# Patient Record
Sex: Female | Born: 1955 | Race: Black or African American | Hispanic: No | Marital: Married | Smoking: Former smoker
Health system: Southern US, Community
[De-identification: ages and names within clinical notes are randomized; demographics above are authoritative.]

## PROBLEM LIST (undated history)

## (undated) DIAGNOSIS — K5090 Crohn's disease, unspecified, without complications: Secondary | ICD-10-CM

## (undated) DIAGNOSIS — E079 Disorder of thyroid, unspecified: Secondary | ICD-10-CM

## (undated) DIAGNOSIS — R32 Unspecified urinary incontinence: Secondary | ICD-10-CM

## (undated) DIAGNOSIS — M1990 Unspecified osteoarthritis, unspecified site: Secondary | ICD-10-CM

## (undated) DIAGNOSIS — K635 Polyp of colon: Secondary | ICD-10-CM

## (undated) DIAGNOSIS — N879 Dysplasia of cervix uteri, unspecified: Secondary | ICD-10-CM

## (undated) DIAGNOSIS — J45909 Unspecified asthma, uncomplicated: Secondary | ICD-10-CM

## (undated) DIAGNOSIS — A0472 Enterocolitis due to Clostridium difficile, not specified as recurrent: Secondary | ICD-10-CM

## (undated) DIAGNOSIS — M8580 Other specified disorders of bone density and structure, unspecified site: Secondary | ICD-10-CM

## (undated) DIAGNOSIS — B009 Herpesviral infection, unspecified: Secondary | ICD-10-CM

## (undated) HISTORY — PX: COLPOSCOPY: SHX161

## (undated) HISTORY — PX: BREAST CYST ASPIRATION: SHX578

## (undated) HISTORY — DX: Herpesviral infection, unspecified: B00.9

## (undated) HISTORY — DX: Polyp of colon: K63.5

## (undated) HISTORY — PX: POLYPECTOMY: SHX149

## (undated) HISTORY — DX: Crohn's disease, unspecified, without complications: K50.90

## (undated) HISTORY — DX: Unspecified osteoarthritis, unspecified site: M19.90

## (undated) HISTORY — DX: Unspecified asthma, uncomplicated: J45.909

## (undated) HISTORY — DX: Dysplasia of cervix uteri, unspecified: N87.9

## (undated) HISTORY — PX: GANGLION CYST EXCISION: SHX1691

## (undated) HISTORY — DX: Unspecified urinary incontinence: R32

## (undated) HISTORY — DX: Other specified disorders of bone density and structure, unspecified site: M85.80

## (undated) HISTORY — DX: Disorder of thyroid, unspecified: E07.9

## (undated) HISTORY — DX: Enterocolitis due to Clostridium difficile, not specified as recurrent: A04.72

## (undated) HISTORY — PX: TONSILLECTOMY: SUR1361

## (undated) HISTORY — PX: BREAST EXCISIONAL BIOPSY: SUR124

## (undated) HISTORY — PX: TRIGGER FINGER RELEASE: SHX641

---

## 1996-10-08 ENCOUNTER — Encounter: Payer: Self-pay | Admitting: Gastroenterology

## 1998-03-14 ENCOUNTER — Encounter: Payer: Self-pay | Admitting: Emergency Medicine

## 1998-03-14 ENCOUNTER — Emergency Department (HOSPITAL_COMMUNITY): Admission: EM | Admit: 1998-03-14 | Discharge: 1998-03-14 | Payer: Self-pay | Admitting: Emergency Medicine

## 1998-05-31 HISTORY — PX: TOTAL ABDOMINAL HYSTERECTOMY: SHX209

## 1998-07-24 ENCOUNTER — Encounter: Payer: Self-pay | Admitting: Internal Medicine

## 1998-07-24 ENCOUNTER — Ambulatory Visit (HOSPITAL_COMMUNITY): Admission: RE | Admit: 1998-07-24 | Discharge: 1998-07-24 | Payer: Self-pay | Admitting: Internal Medicine

## 1998-08-07 ENCOUNTER — Encounter: Payer: Self-pay | Admitting: Gastroenterology

## 1998-08-07 ENCOUNTER — Ambulatory Visit (HOSPITAL_COMMUNITY): Admission: RE | Admit: 1998-08-07 | Discharge: 1998-08-07 | Payer: Self-pay | Admitting: Internal Medicine

## 1998-08-07 ENCOUNTER — Encounter: Payer: Self-pay | Admitting: Internal Medicine

## 1998-08-27 ENCOUNTER — Other Ambulatory Visit: Admission: RE | Admit: 1998-08-27 | Discharge: 1998-08-27 | Payer: Self-pay | Admitting: Obstetrics and Gynecology

## 1998-09-04 ENCOUNTER — Encounter: Payer: Self-pay | Admitting: Endocrinology

## 1998-09-04 ENCOUNTER — Ambulatory Visit (HOSPITAL_COMMUNITY): Admission: RE | Admit: 1998-09-04 | Discharge: 1998-09-04 | Payer: Self-pay | Admitting: Endocrinology

## 1998-09-30 ENCOUNTER — Ambulatory Visit (HOSPITAL_COMMUNITY): Admission: RE | Admit: 1998-09-30 | Discharge: 1998-09-30 | Payer: Self-pay

## 2000-01-28 ENCOUNTER — Emergency Department (HOSPITAL_COMMUNITY): Admission: EM | Admit: 2000-01-28 | Discharge: 2000-01-28 | Payer: Self-pay | Admitting: Emergency Medicine

## 2000-01-28 ENCOUNTER — Encounter: Payer: Self-pay | Admitting: Emergency Medicine

## 2000-06-15 ENCOUNTER — Other Ambulatory Visit: Admission: RE | Admit: 2000-06-15 | Discharge: 2000-06-15 | Payer: Self-pay | Admitting: Obstetrics and Gynecology

## 2000-07-12 ENCOUNTER — Other Ambulatory Visit: Admission: RE | Admit: 2000-07-12 | Discharge: 2000-07-12 | Payer: Self-pay | Admitting: Obstetrics and Gynecology

## 2000-07-12 ENCOUNTER — Encounter (INDEPENDENT_AMBULATORY_CARE_PROVIDER_SITE_OTHER): Payer: Self-pay | Admitting: Specialist

## 2000-09-13 ENCOUNTER — Encounter: Payer: Self-pay | Admitting: Obstetrics and Gynecology

## 2000-09-13 ENCOUNTER — Encounter: Admission: RE | Admit: 2000-09-13 | Discharge: 2000-09-13 | Payer: Self-pay | Admitting: Obstetrics and Gynecology

## 2000-09-14 ENCOUNTER — Encounter (INDEPENDENT_AMBULATORY_CARE_PROVIDER_SITE_OTHER): Payer: Self-pay

## 2000-09-14 ENCOUNTER — Other Ambulatory Visit: Admission: RE | Admit: 2000-09-14 | Discharge: 2000-09-14 | Payer: Self-pay | Admitting: Obstetrics and Gynecology

## 2001-02-06 ENCOUNTER — Encounter (INDEPENDENT_AMBULATORY_CARE_PROVIDER_SITE_OTHER): Payer: Self-pay | Admitting: Specialist

## 2001-02-06 ENCOUNTER — Inpatient Hospital Stay (HOSPITAL_COMMUNITY): Admission: RE | Admit: 2001-02-06 | Discharge: 2001-02-08 | Payer: Self-pay | Admitting: Obstetrics and Gynecology

## 2001-02-12 ENCOUNTER — Encounter: Payer: Self-pay | Admitting: *Deleted

## 2001-02-12 ENCOUNTER — Inpatient Hospital Stay (HOSPITAL_COMMUNITY): Admission: EM | Admit: 2001-02-12 | Discharge: 2001-02-15 | Payer: Self-pay | Admitting: Emergency Medicine

## 2001-02-13 ENCOUNTER — Encounter: Payer: Self-pay | Admitting: *Deleted

## 2001-08-24 ENCOUNTER — Encounter: Payer: Self-pay | Admitting: Emergency Medicine

## 2001-08-24 ENCOUNTER — Encounter: Admission: RE | Admit: 2001-08-24 | Discharge: 2001-08-24 | Payer: Self-pay | Admitting: Emergency Medicine

## 2003-03-11 ENCOUNTER — Other Ambulatory Visit: Admission: RE | Admit: 2003-03-11 | Discharge: 2003-03-11 | Payer: Self-pay | Admitting: Obstetrics and Gynecology

## 2003-08-15 ENCOUNTER — Encounter: Admission: RE | Admit: 2003-08-15 | Discharge: 2003-08-15 | Payer: Self-pay | Admitting: Obstetrics and Gynecology

## 2004-03-12 ENCOUNTER — Other Ambulatory Visit: Admission: RE | Admit: 2004-03-12 | Discharge: 2004-03-12 | Payer: Self-pay | Admitting: Obstetrics and Gynecology

## 2005-03-17 ENCOUNTER — Other Ambulatory Visit: Admission: RE | Admit: 2005-03-17 | Discharge: 2005-03-17 | Payer: Self-pay | Admitting: Obstetrics and Gynecology

## 2005-04-12 ENCOUNTER — Encounter: Admission: RE | Admit: 2005-04-12 | Discharge: 2005-04-12 | Payer: Self-pay | Admitting: Obstetrics and Gynecology

## 2006-03-18 ENCOUNTER — Other Ambulatory Visit: Admission: RE | Admit: 2006-03-18 | Discharge: 2006-03-18 | Payer: Self-pay | Admitting: Obstetrics and Gynecology

## 2006-10-31 ENCOUNTER — Emergency Department (HOSPITAL_COMMUNITY): Admission: EM | Admit: 2006-10-31 | Discharge: 2006-10-31 | Payer: Self-pay | Admitting: Family Medicine

## 2006-12-21 ENCOUNTER — Ambulatory Visit (HOSPITAL_COMMUNITY): Admission: RE | Admit: 2006-12-21 | Discharge: 2006-12-21 | Payer: Self-pay | Admitting: Internal Medicine

## 2007-01-02 ENCOUNTER — Encounter: Admission: RE | Admit: 2007-01-02 | Discharge: 2007-01-02 | Payer: Self-pay | Admitting: Internal Medicine

## 2007-05-30 ENCOUNTER — Other Ambulatory Visit: Admission: RE | Admit: 2007-05-30 | Discharge: 2007-05-30 | Payer: Self-pay | Admitting: Obstetrics and Gynecology

## 2007-09-26 ENCOUNTER — Telehealth: Payer: Self-pay | Admitting: Gastroenterology

## 2007-09-27 ENCOUNTER — Ambulatory Visit: Payer: Self-pay | Admitting: Gastroenterology

## 2007-09-29 DIAGNOSIS — K635 Polyp of colon: Secondary | ICD-10-CM

## 2007-09-29 HISTORY — DX: Polyp of colon: K63.5

## 2007-10-04 ENCOUNTER — Ambulatory Visit: Payer: Self-pay | Admitting: Gastroenterology

## 2007-10-04 ENCOUNTER — Encounter: Payer: Self-pay | Admitting: Gastroenterology

## 2007-10-05 ENCOUNTER — Encounter: Payer: Self-pay | Admitting: Gastroenterology

## 2007-10-08 ENCOUNTER — Encounter: Payer: Self-pay | Admitting: Gastroenterology

## 2007-10-16 ENCOUNTER — Telehealth: Payer: Self-pay | Admitting: Gastroenterology

## 2007-10-31 DIAGNOSIS — J4540 Moderate persistent asthma, uncomplicated: Secondary | ICD-10-CM | POA: Insufficient documentation

## 2007-10-31 DIAGNOSIS — K648 Other hemorrhoids: Secondary | ICD-10-CM | POA: Insufficient documentation

## 2007-10-31 DIAGNOSIS — K6289 Other specified diseases of anus and rectum: Secondary | ICD-10-CM | POA: Insufficient documentation

## 2007-10-31 DIAGNOSIS — Z8719 Personal history of other diseases of the digestive system: Secondary | ICD-10-CM | POA: Insufficient documentation

## 2007-10-31 DIAGNOSIS — D649 Anemia, unspecified: Secondary | ICD-10-CM | POA: Insufficient documentation

## 2007-10-31 DIAGNOSIS — R197 Diarrhea, unspecified: Secondary | ICD-10-CM | POA: Insufficient documentation

## 2007-11-01 ENCOUNTER — Encounter: Payer: Self-pay | Admitting: Gastroenterology

## 2007-11-02 ENCOUNTER — Telehealth: Payer: Self-pay | Admitting: Gastroenterology

## 2007-11-02 ENCOUNTER — Ambulatory Visit: Payer: Self-pay | Admitting: Gastroenterology

## 2007-11-02 DIAGNOSIS — K219 Gastro-esophageal reflux disease without esophagitis: Secondary | ICD-10-CM | POA: Insufficient documentation

## 2007-11-02 DIAGNOSIS — R141 Gas pain: Secondary | ICD-10-CM | POA: Insufficient documentation

## 2007-11-02 DIAGNOSIS — R142 Eructation: Secondary | ICD-10-CM

## 2007-11-02 DIAGNOSIS — R143 Flatulence: Secondary | ICD-10-CM

## 2008-01-01 ENCOUNTER — Ambulatory Visit: Payer: Self-pay | Admitting: Gastroenterology

## 2008-03-22 ENCOUNTER — Encounter: Admission: RE | Admit: 2008-03-22 | Discharge: 2008-03-22 | Payer: Self-pay | Admitting: Obstetrics and Gynecology

## 2008-03-26 ENCOUNTER — Other Ambulatory Visit: Admission: RE | Admit: 2008-03-26 | Discharge: 2008-03-26 | Payer: Self-pay | Admitting: Obstetrics and Gynecology

## 2008-03-26 ENCOUNTER — Ambulatory Visit: Payer: Self-pay | Admitting: Obstetrics and Gynecology

## 2008-03-26 ENCOUNTER — Encounter: Payer: Self-pay | Admitting: Obstetrics and Gynecology

## 2008-04-30 ENCOUNTER — Ambulatory Visit: Payer: Self-pay | Admitting: Obstetrics and Gynecology

## 2008-07-03 ENCOUNTER — Emergency Department (HOSPITAL_COMMUNITY): Admission: EM | Admit: 2008-07-03 | Discharge: 2008-07-03 | Payer: Self-pay | Admitting: Emergency Medicine

## 2008-08-09 ENCOUNTER — Ambulatory Visit: Payer: Self-pay | Admitting: Gastroenterology

## 2008-08-09 DIAGNOSIS — M25519 Pain in unspecified shoulder: Secondary | ICD-10-CM | POA: Insufficient documentation

## 2008-08-15 ENCOUNTER — Ambulatory Visit (HOSPITAL_COMMUNITY): Admission: RE | Admit: 2008-08-15 | Discharge: 2008-08-15 | Payer: Self-pay | Admitting: Internal Medicine

## 2008-12-19 ENCOUNTER — Telehealth: Payer: Self-pay | Admitting: Gastroenterology

## 2009-05-12 ENCOUNTER — Encounter: Admission: RE | Admit: 2009-05-12 | Discharge: 2009-05-12 | Payer: Self-pay | Admitting: Obstetrics and Gynecology

## 2009-05-20 ENCOUNTER — Ambulatory Visit: Payer: Self-pay | Admitting: Obstetrics and Gynecology

## 2009-05-20 ENCOUNTER — Other Ambulatory Visit: Admission: RE | Admit: 2009-05-20 | Discharge: 2009-05-20 | Payer: Self-pay | Admitting: Obstetrics and Gynecology

## 2009-07-13 ENCOUNTER — Emergency Department (HOSPITAL_COMMUNITY): Admission: EM | Admit: 2009-07-13 | Discharge: 2009-07-13 | Payer: Self-pay | Admitting: Emergency Medicine

## 2009-08-19 ENCOUNTER — Ambulatory Visit: Payer: Self-pay | Admitting: Gastroenterology

## 2010-01-26 ENCOUNTER — Telehealth: Payer: Self-pay | Admitting: Gastroenterology

## 2010-01-28 ENCOUNTER — Ambulatory Visit: Payer: Self-pay | Admitting: Gastroenterology

## 2010-01-28 DIAGNOSIS — R1084 Generalized abdominal pain: Secondary | ICD-10-CM | POA: Insufficient documentation

## 2010-01-28 DIAGNOSIS — K625 Hemorrhage of anus and rectum: Secondary | ICD-10-CM | POA: Insufficient documentation

## 2010-01-28 LAB — CONVERTED CEMR LAB
Basophils Absolute: 0.1 10*3/uL (ref 0.0–0.1)
Basophils Relative: 1.9 % (ref 0.0–3.0)
CRP, High Sensitivity: 19.16 — ABNORMAL HIGH (ref 0.00–5.00)
Eosinophils Absolute: 0.4 10*3/uL (ref 0.0–0.7)
Eosinophils Relative: 9.9 % — ABNORMAL HIGH (ref 0.0–5.0)
HCT: 39.2 % (ref 36.0–46.0)
Hemoglobin: 13.4 g/dL (ref 12.0–15.0)
Lymphocytes Relative: 24.2 % (ref 12.0–46.0)
Lymphs Abs: 0.9 10*3/uL (ref 0.7–4.0)
MCHC: 34.3 g/dL (ref 30.0–36.0)
MCV: 90.1 fL (ref 78.0–100.0)
Monocytes Absolute: 0.4 10*3/uL (ref 0.1–1.0)
Monocytes Relative: 11.2 % (ref 3.0–12.0)
Neutro Abs: 1.9 10*3/uL (ref 1.4–7.7)
Neutrophils Relative %: 52.8 % (ref 43.0–77.0)
Platelets: 321 10*3/uL (ref 150.0–400.0)
RBC: 4.35 M/uL (ref 3.87–5.11)
RDW: 13.4 % (ref 11.5–14.6)
WBC: 3.7 10*3/uL — ABNORMAL LOW (ref 4.5–10.5)

## 2010-02-07 ENCOUNTER — Encounter: Payer: Self-pay | Admitting: Gastroenterology

## 2010-02-07 ENCOUNTER — Ambulatory Visit: Payer: Self-pay | Admitting: Internal Medicine

## 2010-02-07 ENCOUNTER — Inpatient Hospital Stay (HOSPITAL_COMMUNITY): Admission: EM | Admit: 2010-02-07 | Discharge: 2010-02-10 | Payer: Self-pay | Admitting: Emergency Medicine

## 2010-02-10 ENCOUNTER — Encounter: Payer: Self-pay | Admitting: Gastroenterology

## 2010-02-25 ENCOUNTER — Ambulatory Visit: Payer: Self-pay | Admitting: Gastroenterology

## 2010-05-13 ENCOUNTER — Encounter
Admission: RE | Admit: 2010-05-13 | Discharge: 2010-05-13 | Payer: Self-pay | Source: Home / Self Care | Attending: Obstetrics and Gynecology | Admitting: Obstetrics and Gynecology

## 2010-06-08 ENCOUNTER — Ambulatory Visit (HOSPITAL_COMMUNITY)
Admission: RE | Admit: 2010-06-08 | Discharge: 2010-06-08 | Payer: Self-pay | Source: Home / Self Care | Attending: Internal Medicine | Admitting: Internal Medicine

## 2010-06-10 ENCOUNTER — Ambulatory Visit: Admission: EM | Admit: 2010-06-10 | Payer: Self-pay | Source: Home / Self Care | Admitting: Obstetrics and Gynecology

## 2010-06-20 ENCOUNTER — Encounter: Payer: Self-pay | Admitting: Obstetrics and Gynecology

## 2010-06-29 ENCOUNTER — Other Ambulatory Visit (HOSPITAL_COMMUNITY)
Admission: RE | Admit: 2010-06-29 | Discharge: 2010-06-29 | Disposition: A | Discharge status: Home / Self Care | Payer: Managed Care, Other (non HMO) | Source: Ambulatory Visit | Attending: Obstetrics and Gynecology | Admitting: Obstetrics and Gynecology

## 2010-06-29 ENCOUNTER — Ambulatory Visit
Admission: RE | Admit: 2010-06-29 | Discharge: 2010-06-29 | Payer: Self-pay | Source: Home / Self Care | Attending: Obstetrics and Gynecology | Admitting: Obstetrics and Gynecology

## 2010-06-29 ENCOUNTER — Other Ambulatory Visit: Payer: Self-pay | Admitting: Obstetrics and Gynecology

## 2010-06-29 DIAGNOSIS — Z124 Encounter for screening for malignant neoplasm of cervix: Secondary | ICD-10-CM | POA: Insufficient documentation

## 2010-06-30 NOTE — Op Note (Signed)
Summary: Colonoscopy & Biopsies/[facility]  [facility] & Biopsies/[facility]   Imported By: Phillis Knack 08/10/2008 08:28:53  _____________________________________________________________________  External Attachment:    Type:   Image     Comment:   External Document

## 2010-06-30 NOTE — Progress Notes (Signed)
Summary: Triage  Phone Note Call from Patient Call back at Home Phone [phone]   Caller: Patient Call For: Dr Fuller Plan Reason for Call: Talk to Nurse Details for Reason: Triage Summary of Call: Pt is having bright red blood in stool and abd cramping, states these symptoms are waking her from sleep and does not wish to wait until next available, which is 03/26/10 Initial call taken by: Cora Daniels,  January 26, 2010 2:55 PM  Follow-up for Phone Call        Patient reports a 1 week hx of bright red rectal bleeding, diarrhea ,abdominal cramping, and urgency.  She states she is taking Lialda 2.4 mg q am.  Denies nasuea, vomiting, or abdominal pain.  Please advise. Follow-up by: Barb Merino RN, [place] [facility],  January 26, 2010 3:27 PM  Additional Follow-up for Phone Call Additional follow up Details #1::        Workin with extender when I am supervising MD. Additional Follow-up by: Ladene Artist MD Marval Regal,  January 26, 2010 3:34 PM    Additional Follow-up for Phone Call Additional follow up Details #2::    Patient  will come in and see Nicoletta Ba PA 01/28/10 9:30 Follow-up by: Barb Merino RN, CGRN,  January 26, 2010 3:39 PM

## 2010-06-30 NOTE — Assessment & Plan Note (Signed)
Summary: post hospital c-diff/UC/sheri   History of Present Illness Visit Type: Follow-up Visit Primary GI MD: Joylene Igo MD [facility] Primary Tamsen Reist: Rachel Moulds, DO Requesting Annalysse Shoemaker: n/a Chief Complaint: Post hospital/ c-diff,bowels are still soft; other symptoms have improved History of Present Illness:   Brianna Santana returns following hospitalization for C. difficile diarrhea. She completed a 14 day course of vancomycin and states she was compliant with all medications. I treated her in the hospital and reviewed her discharge summary as well. Her stools are soft, but otherwise her symptoms have completely resolved. She feels well.   GI Review of Systems      Denies abdominal pain, acid reflux, belching, bloating, chest pain, dysphagia with liquids, dysphagia with solids, heartburn, loss of appetite, nausea, vomiting, vomiting blood, weight loss, and  weight gain.        Denies anal fissure, black tarry stools, change in bowel habit, constipation, diarrhea, diverticulosis, fecal incontinence, heme positive stool, hemorrhoids, irritable bowel syndrome, jaundice, light color stool, liver problems, rectal bleeding, and  rectal pain.   Current Medications (verified): 1)  Lialda 1.2 Gm  Tbec (Mesalamine) .... 2 By Mouth Twice Daily 2)  Singulair 10 Mg  Tabs (Montelukast Sodium) .... One Tablet Once Daily 3)  Proair Hfa 108 (90 Base) Mcg/act  Aers (Albuterol Sulfate) .... As Needed 4)  Multivitamins  Tabs (Multiple Vitamin) .... Take 1 Tablet By Mouth Once A Day 5)  Calcium 600/vitamin D 600-400 Mg-Unit Tabs (Calcium Carbonate-Vitamin D) .... Take 1 Tablet By Mouth Two Times A Day 6)  Omeprazole 40 Mg Cpdr (Omeprazole) .... One Tablet By Mouth Every Morning As Needed 7)  Bentyl 10 Mg Caps (Dicyclomine Hcl) .... 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 Diff colitis 2011  Past Surgical History: Reviewed history from 10/31/2007 and no changes required. Partial Hysterectomy-2002 Tonsillectomy Right Breast Lumpectomy-1980's  Family History: Reviewed history from 10/31/2007 and no changes required. No FH of Colon Cancer: Family History of Diabetes:Father,sisters,brother  Social History: Reviewed history from 10/31/2007 and no changes required. Married Patient is a former smoker. smoked x 10 yrs Alcohol Use - no Daily Caffeine Use- 3 per day Illicit Drug Use - no  Review of Systems       The pertinent positives and negatives are noted as above and in the HPI. All other ROS were reviewed and were negative.   Vital Signs:  Patient profile:   55 year old female Height:      63 inches Weight:      164.50 pounds BMI:     29.25 Pulse rate:   60 / minute Pulse rhythm:   regular BP sitting:   108 / 70  (left arm) Cuff size:   regular  Vitals Entered By: June McMurray [place] Deborra Medina) (February 25, 2010 9:41 AM)  Physical Exam  General:  Well developed, well nourished, no acute distress. Head:  Normocephalic and atraumatic. Eyes:  PERRLA, no icterus. Ears:  Normal auditory acuity. Mouth:  No deformity or lesions, dentition normal. Lungs:  Clear throughout to auscultation. Heart:  Regular rate and rhythm; no murmurs, rubs,  or bruits. Abdomen:  Soft, nontender and nondistended. No masses, hepatosplenomegaly or hernias noted. Normal bowel sounds. Psych:  Alert  and cooperative. Normal mood and affect.  Impression & Recommendations:  Problem # 1:  CLOSTRIDIUM DIFFICILE COLITIS (ICD-008.45) Resolved infection. Continue Florastor for 2 more weeks.  Problem # 2:  COLITIS, ULCERATIVE (ICD-556.9) Stable. Continue Lialda 2.4 g  b.i.d.  Patient Instructions: 1)  The prescription for Florastor has been sent to your pharmacy for you to take x 2 weeks.  2)  Please schedule a follow-up appointment in 1 year. 3)  Copy sent to : Rachel Moulds, DO 4)  The medication list was reviewed and reconciled.  All changed / newly prescribed medications were explained.  A complete medication list was provided to the patient / caregiver.  Prescriptions: FLORASTOR 250 MG CAPS (SACCHAROMYCES BOULARDII) one capsule by mouth two times a day  #28 x 0   Entered by:   Brianna Santana CMA ([place])   Authorized by:   Ladene Artist MD [facility]   Signed by:   Brianna Santana CMA ([place]) on 02/25/2010   Method used:   Electronically to        [id number] (retail)       [address] [place], [place]  [address]       Ph: [phone]       Fax: [phone]   RxID:   [id number]

## 2010-06-30 NOTE — Assessment & Plan Note (Signed)
Summary: 2 MONTH RECHECK/LD   History of Present Illness Visit Type: follow up Primary GI MD: Joylene Igo MD [facility] Primary Provider: Rachel Moulds, DO Chief Complaint: ulcerative colitis History of Present Illness:   Brianna Santana returns for followup of ulcerative colitis. She has been doing very well for the past 2 months.  Last night she developed a dry cough and this morning she noticed a slight right lower quadrant discomfort when coughing.   GI Review of Systems     Location of  Abdominal pain: RLQ.    Denies abdominal pain, acid reflux, belching, bloating, chest pain, dysphagia with liquids, dysphagia with solids, heartburn, loss of appetite, nausea, vomiting, vomiting blood, weight loss, and  weight gain.        Denies anal fissure, black tarry stools, change in bowel habit, constipation, diarrhea, diverticulosis, fecal incontinence, heme positive stool, hemorrhoids, irritable bowel syndrome, jaundice, light color stool, liver problems, rectal bleeding, and  rectal pain.    Prior Medications Reviewed Using: Patient Recall  Updated Prior Medication List: LIALDA 1.2 GM  TBEC (MESALAMINE) 2 by mouth once daily SINGULAIR 10 MG  TABS (MONTELUKAST SODIUM) one tablet once daily PROAIR HFA 108 (90 BASE) MCG/ACT  AERS (ALBUTEROL SULFATE) as needed KLS ALLERCLEAR 10 MG  TABS (LORATADINE) once daily  Current Allergies: No known allergies  Past Medical History:    Reviewed history from 11/02/2007 and no changes required:       INTERNAL HEMORRHOIDS (ICD-455.0)       LEFT SIDED ULCERATIVE COLITIS       PERSONAL HISTORY OF HYPEPLASTIC COLONIC POLYPS       ASTHMA (ICD-493.90)       ANEMIA (ICD-285.9)                              Past Surgical History:    Reviewed history from 10/31/2007 and no changes required:       Partial Hysterectomy-2002       Tonsillectomy       Right Breast Lumpectomy-1980's   Family History:    Reviewed history from 10/31/2007 and no changes  required:       No FH of Colon Cancer:       Family History of Diabetes:Father,sisters,brother  Social History:    Reviewed history from 10/31/2007 and no changes required:       Married       Patient is a former smoker. smoked x 10 yrs       Alcohol Use - no       Daily Caffeine Use- 3 per day       Illicit Drug Use - no   Review of Systems       The patient complains of allergy/sinus, anemia, cough, muscle pains/cramps, skin rash, and urination - excessive.         The pertinent positives and negatives are noted as above and in the HPI. All other ROS were negative.    Vital Signs:  Patient Profile:   55 Years Old Female Height:     63 inches Weight:      167.38 pounds Pulse rate:   60 / minute Pulse rhythm:   regular BP sitting:   110 / 62  (left arm)  Vitals Entered By: June McMurray [place] (January 01, 2008 8:39 AM)                  Physical Exam  Head:     Normocephalic and atraumatic. Eyes:     PERRLA, no icterus. Mouth:     No deformity or lesions, dentition normal. Lungs:     Clear throughout to auscultation. Heart:     Regular rate and rhythm; no murmurs, rubs,  or bruits. Abdomen:     Soft and nondistended. No masses, hepatosplenomegaly or hernias noted. Normal bowel sounds.  Minimal RLQ tenderness, without guarding, and without rebound.     Impression & Recommendations:  Problem # 1:  ULCERATIVE COLITIS-LEFT SIDE (ICD-556.5) Continue Lialda 2.4 g q.a.m. Her mild right lower quadrant pain appears to be musculoskeletal and related to her cough. She will followup with her primary physician for cough if it persists.   Patient Instructions: 1)  Please schedule a follow-up appointment in 6 months. 2)  Copy Sent To: Amy Johnnye Sima DO    Prescriptions: LIALDA 1.2 GM  TBEC (MESALAMINE) 2 by mouth once daily  #60 x 11   Entered by:   Marlon Pel CMA   Authorized by:   Ladene Artist MD [facility]   Signed by:   Marlon Pel CMA on 01/01/2008    Method used:   Electronically sent to ...       [facility]       [place], [place]  [address]       Ph: [phone]       Fax: [phone]   RxID:   [id number]  ]

## 2010-06-30 NOTE — Assessment & Plan Note (Signed)
Summary: ?UC flare/sheri   History of Present Illness Visit Type: Follow-up Visit Primary GI MD: Joylene Igo MD [facility] Primary Provider: Rachel Moulds, DO Requesting Provider: n/a Chief Complaint: Possible UC flare History of Present Illness:   PLEASANT 55 YO FEMALE  KNOWN TO DR. Fuller Plan WITH LEFT SIDED ULCERATIVE COLITIS DX IN 2009. SHE HAS BEEN WELL CONTROLLED WITH LOW DOSE LIALDA 1.2 GM, 2 DAILY. SHE COMES IN NOW WITH  2 WEEK HX OF INCREASED FREQUENCY OF STOOLS-HAVING 3-4 LOOSE BM'S DAILY, LOWER ABDOMINAL CRAMPING. APPETITE FAIR, NO WEIGHT LOSS. ENERGY LEVEL NORMAL. SHE STATRED SEEING BLOOD COATING STOOLS A FEW DAYS AGO. NO NEW MEDS, NO RECENT ABX ETC.   GI Review of Systems    Reports abdominal pain and  nausea.     Location of  Abdominal pain: LLQ.    Denies acid reflux, belching, bloating, chest pain, dysphagia with liquids, dysphagia with solids, heartburn, loss of appetite, vomiting, vomiting blood, weight loss, and  weight gain.      Reports change in bowel habits, diarrhea, and  rectal bleeding.     Denies anal fissure, black tarry stools, constipation, diverticulosis, fecal incontinence, heme positive stool, hemorrhoids, irritable bowel syndrome, jaundice, light color stool, liver problems, and  rectal pain.   Current Medications (verified): 1)  Lialda 1.2 Gm  Tbec (Mesalamine) .... 2 By Mouth Once Daily 2)  Singulair 10 Mg  Tabs (Montelukast Sodium) .... One Tablet Once Daily 3)  Proair Hfa 108 (90 Base) Mcg/act  Aers (Albuterol Sulfate) .... As Needed 4)  Kls Allerclear 10 Mg  Tabs (Loratadine) .... Once Daily 5)  Multivitamins  Tabs (Multiple Vitamin) .... Take 1 Tablet By Mouth Once A Day 6)  Calcium 600/vitamin D 600-400 Mg-Unit Tabs (Calcium Carbonate-Vitamin D) .... Take 1 Tablet By Mouth Two Times A Day 7)  Omeprazole 40 Mg Cpdr (Omeprazole) .... 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 smoked x 10 yrs Alcohol Use - no Daily Caffeine Use- 3 per day Illicit Drug Use - no  Review of Systems       The patient complains of arthritis/joint pain and back pain.  The patient denies allergy/sinus, anemia, anxiety-new, blood in urine, breast changes/lumps, change in vision, confusion, cough, coughing up blood, depression-new, fainting, fatigue, fever, headaches-new, hearing problems, heart murmur, heart rhythm changes, itching, menstrual pain, muscle pains/cramps, night sweats, nosebleeds, pregnancy symptoms, shortness of breath, skin rash, sleeping problems, sore throat, swelling of feet/legs, swollen lymph glands, thirst - excessive , urination - excessive , urination changes/pain, urine leakage, vision changes, and voice change.    Vital Signs:  Patient profile:   55 year old female Height:      63 inches Weight:      161 pounds BMI:     28.62 BSA:     1.77 Pulse rate:   80 / minute Pulse rhythm:   regular BP sitting:   94 / 60  (left arm)  Vitals Entered By: [place] Deborra Medina) (January 28, 2010 9:32  AM)  Physical Exam  General:  Well developed, well nourished, no acute distress. Head:  Normocephalic and atraumatic. Eyes:  PERRLA, no icterus. Lungs:  Clear throughout to auscultation. Heart:  Regular rate and rhythm; no murmurs, rubs,  or bruits. Abdomen:  SOFT, MILD TENDERNESS LLQ/LMQ, NO  RENOUND, NO MASS OR HSM,BS+ Rectal:  NOT DONE Neurologic:  Alert and  oriented x4;  grossly normal neurologically. Psych:  Alert and cooperative. Normal mood and affect.  Impression & Recommendations:  Problem # 1:  COLITIS, ULCERATIVE (ICD-556.9) Assessment Deteriorated 54 YO FEMALE WITH LEFT SIDED U.C . WITH EXACERBATION X 2 WEEKS WITH CRAMPING, DIARRHEA AND HEMATOCHEZIA. INCREASE LIALDA TO 2.4 GM TWICE DAILY ADD BENTYL 10 MG 3 X DAILY AS NEEDED. PT ASKED TO CALL BACK IN A WEEK -IF NO BETTER WILL START PREDNISONE  FOLLOW UP WITH DR. Fuller Plan  OR MYSELF IN 2-3 WEEKS. FOLLOW UP COLON DUE 09/2010 Orders: TLB-CRP-High Sensitivity (C-Reactive Protein) (86140-FCRP) TLB-CBC Platelet - w/Differential ([id number])  Patient Instructions: 1)  Please go to lab, basement level. 2)  We sent prescriptions to [facility] for Lialda and Bentyl.  3)  Calll [name] in 1 week, ask for Pam at [phone].  We would like you to call sooner if you have not improved.   4)  When you call we will make you an appt to see Dr. Fuller Plan or the PA again if need be. 5)  Copy sent to :  Rachel Moulds, DO 6)  The medication list was reviewed and reconciled.  All changed / newly prescribed medications were explained.  A complete medication list was provided to the patient / caregiver.  Prescriptions: BENTYL 10 MG CAPS (DICYCLOMINE HCL) Take 1 tab 3 times daily as needed for abdominal cramping and spasms  #90 x 1   Entered by:   Marisue Humble NCMA   Authorized by:   Alfredia Ferguson PA-c   Signed by:   Marisue Humble NCMA on 01/28/2010   Method used:   Electronically to        [id number] (retail)       [address] [place], [place]  [address]       Ph: [phone]       Fax: [phone]   RxID:   [id number] LIALDA 1.2 GM  TBEC (MESALAMINE) 2 by mouth twice daily  #120 x 3   Entered by:   Marisue Humble NCMA   Authorized by:   Alfredia Ferguson PA-c   Signed by:   Marisue Humble NCMA on  01/28/2010   Method used:   Electronically to        [id number] (retail)       [address] [place], [place]  [address]       Ph: [phone]       Fax: [phone]   RxID:   [id number]

## 2010-06-30 NOTE — Assessment & Plan Note (Signed)
Summary: YEARLY MED REFILL F-UP/YF   History of Present Illness Visit Type: Follow-up Visit Primary GI MD: Joylene Igo MD [facility] Primary Provider: Rachel Moulds, DO Chief Complaint: Patient states that she ran out of [place] earlier this month. She has not been taking nay since 3/12. She states that she is doing good, but needs a refill on her meds. The rx is at the pharm and she has not picked it up yet.  History of Present Illness:   This is a 55 year old female, who returns for followup of left-sided ulcerative colitis and GERD. She has been completely asymptomatic. She takes omeprazole on demand. She underwent colonoscopy in 09/2007.   GI Review of Systems      Denies abdominal pain, acid reflux, belching, bloating, chest pain, dysphagia with liquids, dysphagia with solids, heartburn, loss of appetite, nausea, vomiting, vomiting blood, weight loss, and  weight gain.        Denies anal fissure, black tarry stools, change in bowel habit, constipation, diarrhea, diverticulosis, fecal incontinence, heme positive stool, hemorrhoids, irritable bowel syndrome, jaundice, light color stool, liver problems, rectal bleeding, and  rectal pain.   Current Medications (verified): 1)  Lialda 1.2 Gm  Tbec (Mesalamine) .... 2 By Mouth Once Daily 2)  Singulair 10 Mg  Tabs (Montelukast Sodium) .... One Tablet Once Daily 3)  Proair Hfa 108 (90 Base) Mcg/act  Aers (Albuterol Sulfate) .... As Needed 4)  Kls Allerclear 10 Mg  Tabs (Loratadine) .... Once Daily 5)  Multivitamins  Tabs (Multiple Vitamin) .... Take 1 Tablet By Mouth Once A Day 6)  Calcium 600/vitamin D 600-400 Mg-Unit Tabs (Calcium Carbonate-Vitamin D) .... Take 1 Tablet By Mouth Two Times A Day 7)  Omeprazole 40 Mg Cpdr (Omeprazole) .... 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 smoked x 10 yrs Alcohol Use - no Daily Caffeine Use- 3 per day Illicit Drug Use - no  Review of Systems       The patient complains of allergy/sinus, arthritis/joint pain, and thirst - excessive.         The pertinent positives and negatives are noted as above and in the HPI. All other ROS were reviewed and were negative.   Vital Signs:  Patient profile:   55 year old female Height:      63 inches Weight:      162.8 pounds BMI:     28.94 Pulse rate:   92 / minute Pulse rhythm:   regular BP sitting:   102 / 62  (left arm) Cuff size:   regular  Vitals Entered By: Bernita Buffy CMA Deborra Medina) (August 19, 2009 11:21 AM)  Physical Exam  General:  Well developed, well nourished, no acute distress. Head:  Normocephalic and atraumatic. Eyes:  PERRLA, no icterus. Ears:  Normal auditory acuity. Mouth:  No deformity or lesions, dentition normal. Lungs:  Clear throughout to auscultation. Heart:  Regular rate and rhythm; no murmurs, rubs,  or bruits. Abdomen:  Soft, nontender and nondistended. No masses, hepatosplenomegaly or hernias  noted. Normal bowel sounds. Neurologic:  Alert and  oriented x4;  grossly normal neurologically. Psych:  Alert and cooperative. Normal mood and affect.  Impression & Recommendations:  Problem # 1:  ULCERATIVE COLITIS-LEFT SIDE (ICD-556.5) Continue Lialda 2.4g qam. Colonoscopy May 2012.  Problem # 2:  GERD (ICD-530.81) Continue omeprazole 40 mg daily as needed, along with  standard antireflux measures.  Patient Instructions: 1)  Pick up prescriptions from your pharmacy.  2)  Please schedule a follow-up appointment in 1 year. 3)  Copy sent to : Rachel Moulds, DO 4)  The medication list was reviewed and reconciled.  All changed / newly prescribed medications were explained.  A complete medication list was provided to the patient / caregiver.  Prescriptions: LIALDA 1.2 GM  TBEC (MESALAMINE) 2 by mouth once daily  #60 x 11   Entered by:   Marlon Pel CMA ([place])   Authorized by:   Ladene Artist MD [facility]   Signed by:   Ladene Artist MD [facility] on 08/19/2009   Method used:   Electronically to        [id number] (retail)       [address] [place], [place]  [address]       Ph: [phone]       Fax: [phone]   RxID:   [id number] OMEPRAZOLE 40 MG CPDR (OMEPRAZOLE) one tablet by mouth every morning as needed  #30 x 11   Entered by:   Marlon Pel CMA ([place])   Authorized by:   Ladene Artist MD [facility]   Signed by:   Ladene Artist MD [facility] on 08/19/2009   Method used:   Electronically to        [id number] (retail)       [address] [place], [place]  [address]       Ph: [phone]       Fax: [phone]   RxID:   [id number]

## 2010-07-01 ENCOUNTER — Telehealth: Payer: Self-pay | Admitting: Gastroenterology

## 2010-07-01 ENCOUNTER — Emergency Department (HOSPITAL_COMMUNITY)
Admission: EM | Admit: 2010-07-01 | Discharge: 2010-07-01 | Disposition: A | Discharge status: Home / Self Care | Payer: Managed Care, Other (non HMO) | Attending: Emergency Medicine | Admitting: Emergency Medicine

## 2010-07-01 ENCOUNTER — Encounter (INDEPENDENT_AMBULATORY_CARE_PROVIDER_SITE_OTHER): Payer: Self-pay | Admitting: *Deleted

## 2010-07-01 DIAGNOSIS — Z79899 Other long term (current) drug therapy: Secondary | ICD-10-CM | POA: Insufficient documentation

## 2010-07-01 DIAGNOSIS — A0472 Enterocolitis due to Clostridium difficile, not specified as recurrent: Secondary | ICD-10-CM | POA: Insufficient documentation

## 2010-07-01 DIAGNOSIS — K922 Gastrointestinal hemorrhage, unspecified: Secondary | ICD-10-CM | POA: Insufficient documentation

## 2010-07-01 DIAGNOSIS — R1915 Other abnormal bowel sounds: Secondary | ICD-10-CM | POA: Insufficient documentation

## 2010-07-01 DIAGNOSIS — R197 Diarrhea, unspecified: Secondary | ICD-10-CM | POA: Insufficient documentation

## 2010-07-01 DIAGNOSIS — R1032 Left lower quadrant pain: Secondary | ICD-10-CM | POA: Insufficient documentation

## 2010-07-01 DIAGNOSIS — K921 Melena: Secondary | ICD-10-CM | POA: Insufficient documentation

## 2010-07-01 LAB — DIFFERENTIAL
Basophils Absolute: 0.1 10*3/uL (ref 0.0–0.1)
Basophils Relative: 1 % (ref 0–1)
Eosinophils Absolute: 0.6 10*3/uL (ref 0.0–0.7)
Eosinophils Relative: 10 % — ABNORMAL HIGH (ref 0–5)
Lymphocytes Relative: 24 % (ref 12–46)
Lymphs Abs: 1.3 10*3/uL (ref 0.7–4.0)
Monocytes Absolute: 1.1 10*3/uL — ABNORMAL HIGH (ref 0.1–1.0)
Monocytes Relative: 20 % — ABNORMAL HIGH (ref 3–12)
Neutro Abs: 2.5 10*3/uL (ref 1.7–7.7)
Neutrophils Relative %: 45 % (ref 43–77)

## 2010-07-01 LAB — CBC
HCT: 34.7 % — ABNORMAL LOW (ref 36.0–46.0)
Hemoglobin: 11.8 g/dL — ABNORMAL LOW (ref 12.0–15.0)
MCH: 29.3 pg (ref 26.0–34.0)
MCHC: 34 g/dL (ref 30.0–36.0)
MCV: 86.1 fL (ref 78.0–100.0)
Platelets: 345 10*3/uL (ref 150–400)
RBC: 4.03 MIL/uL (ref 3.87–5.11)
RDW: 13.7 % (ref 11.5–15.5)
WBC: 5.5 10*3/uL (ref 4.0–10.5)

## 2010-07-01 LAB — BASIC METABOLIC PANEL
BUN: 6 mg/dL (ref 6–23)
CO2: 27 mEq/L (ref 19–32)
Calcium: 9.4 mg/dL (ref 8.4–10.5)
Chloride: 106 mEq/L (ref 96–112)
Creatinine, Ser: 0.63 mg/dL (ref 0.4–1.2)
GFR calc Af Amer: >60 mL/min (ref >60)
GFR calc non Af Amer: >60 mL/min (ref >60)
Glucose, Bld: 94 mg/dL (ref 70–99)
Potassium: 3.5 mEq/L (ref 3.5–5.1)
Sodium: 141 mEq/L (ref 135–145)

## 2010-07-01 LAB — URINALYSIS, ROUTINE W REFLEX MICROSCOPIC
Bilirubin Urine: NEGATIVE
Hgb urine dipstick: NEGATIVE
Ketones, ur: NEGATIVE mg/dL
Nitrite: NEGATIVE
Protein, ur: NEGATIVE mg/dL
Specific Gravity, Urine: 1.017 (ref 1.005–1.030)
Urine Glucose, Fasting: NEGATIVE mg/dL
Urobilinogen, UA: 0.2 mg/dL (ref 0.0–1.0)
pH: 6 (ref 5.0–8.0)

## 2010-07-01 LAB — CLOSTRIDIUM DIFFICILE BY PCR

## 2010-07-02 ENCOUNTER — Encounter: Payer: Self-pay | Admitting: Nurse Practitioner

## 2010-07-02 ENCOUNTER — Ambulatory Visit (INDEPENDENT_AMBULATORY_CARE_PROVIDER_SITE_OTHER): Payer: Managed Care, Other (non HMO) | Admitting: Nurse Practitioner

## 2010-07-02 DIAGNOSIS — K5190 Ulcerative colitis, unspecified, without complications: Secondary | ICD-10-CM

## 2010-07-02 DIAGNOSIS — A0472 Enterocolitis due to Clostridium difficile, not specified as recurrent: Secondary | ICD-10-CM

## 2010-07-03 ENCOUNTER — Ambulatory Visit: Payer: Self-pay | Admitting: Gastroenterology

## 2010-07-03 ENCOUNTER — Telehealth: Payer: Self-pay | Admitting: Nurse Practitioner

## 2010-07-06 ENCOUNTER — Telehealth: Payer: Self-pay | Admitting: Nurse Practitioner

## 2010-07-08 NOTE — Progress Notes (Signed)
Summary: Just got home from Emergency Room  Phone Note Call from Patient Call back at Home Phone [phone]   Caller: Shanika-daughter Call For: Dr Fuller Plan Reason for Call: Talk to Nurse Summary of Call: Was in ER with mom todya for 5hours and is very upset they did not help her Mom. Would like to be seen tomorrow instead of Friday. Initial call taken by: Irwin Brakeman [facility],  July 01, 2010 4:35 PM  Follow-up for Phone Call        Patient was seen in the ER yesterday for diarrhea.  Diagnosed again with c-diff.  She is having rectal bleeding and abdominal pain also.  She was given an rx for flagyl for 7 days.  I have scehduled her to see Tye Savoy RNP today.  I will cancel her appointment with Dr Fuller Plan for tomorrow afternoon. Follow-up by: Barb Merino RN, CGRN,  July 02, 2010 11:05 AM

## 2010-07-08 NOTE — Progress Notes (Signed)
Summary: rx not in pharmacy  Phone Note Call from Patient Call back at Home Phone [phone]   Caller: Patient Call For: Nevin Bloodgood  Reason for Call: Talk to Nurse Summary of Call: Patient states that the Levbid is not in [facility] please resend it Initial call taken by: Ronalee Red,  July 03, 2010 10:06 AM  Follow-up for Phone Call        Called patient to see how she is doing. Patient tried Bentyl and not really helping so told her that Levbid may not be much more effective and she should take the Hydrocodone instead. Doesn't sound like Levbid was ever sent to pharmacy anyway but I will make sure. Patient will call us Monday with condition update. Follow-up by: Tye Savoy NP,  July 03, 2010 11:57 AM  Additional Follow-up for Phone Call Additional follow up Details #1::        I had faxed the Hydrocodone RX to [facility]. Pt called and said it was not there.  Pt came into the office today and we gave her the perscription signed by Tye Savoy ACNP.  Additional Follow-up by: Sharol Roussel,  July 03, 2010 12:24 PM

## 2010-07-13 ENCOUNTER — Encounter (INDEPENDENT_AMBULATORY_CARE_PROVIDER_SITE_OTHER): Payer: Self-pay | Admitting: *Deleted

## 2010-07-13 ENCOUNTER — Other Ambulatory Visit: Payer: Managed Care, Other (non HMO)

## 2010-07-13 ENCOUNTER — Other Ambulatory Visit: Payer: Self-pay | Admitting: Nurse Practitioner

## 2010-07-13 DIAGNOSIS — K625 Hemorrhage of anus and rectum: Secondary | ICD-10-CM

## 2010-07-13 LAB — CBC WITH DIFFERENTIAL/PLATELET
Basophils Absolute: 0.1 10*3/uL (ref 0.0–0.1)
Basophils Relative: 1.4 % (ref 0.0–3.0)
Eosinophils Absolute: 0.4 10*3/uL (ref 0.0–0.7)
Eosinophils Relative: 8.3 % — ABNORMAL HIGH (ref 0.0–5.0)
HCT: 32.5 % — ABNORMAL LOW (ref 36.0–46.0)
Hemoglobin: 11.2 g/dL — ABNORMAL LOW (ref 12.0–15.0)
Lymphocytes Relative: 38.7 % (ref 12.0–46.0)
Lymphs Abs: 1.8 10*3/uL (ref 0.7–4.0)
MCHC: 34.4 g/dL (ref 30.0–36.0)
MCV: 89.8 fl (ref 78.0–100.0)
Monocytes Absolute: 0.5 10*3/uL (ref 0.1–1.0)
Monocytes Relative: 9.6 % (ref 3.0–12.0)
Neutro Abs: 2 10*3/uL (ref 1.4–7.7)
Neutrophils Relative %: 42 % — ABNORMAL LOW (ref 43.0–77.0)
Platelets: 496 10*3/uL — ABNORMAL HIGH (ref 150.0–400.0)
RBC: 3.62 Mil/uL — ABNORMAL LOW (ref 3.87–5.11)
RDW: 15 % — ABNORMAL HIGH (ref 11.5–14.6)
WBC: 4.8 10*3/uL (ref 4.5–10.5)

## 2010-07-14 ENCOUNTER — Ambulatory Visit (INDEPENDENT_AMBULATORY_CARE_PROVIDER_SITE_OTHER): Payer: Managed Care, Other (non HMO) | Admitting: Nurse Practitioner

## 2010-07-14 ENCOUNTER — Encounter: Payer: Self-pay | Admitting: Nurse Practitioner

## 2010-07-14 DIAGNOSIS — A0472 Enterocolitis due to Clostridium difficile, not specified as recurrent: Secondary | ICD-10-CM

## 2010-07-14 DIAGNOSIS — K5190 Ulcerative colitis, unspecified, without complications: Secondary | ICD-10-CM

## 2010-07-16 NOTE — Assessment & Plan Note (Signed)
Summary: abdominal pain/ c-diff positive in the ER yesterday.   Vital Signs:  Patient profile:   55 year old female Height:      63 inches Weight:      172.25 pounds BMI:     30.62 Temp:     98.6 degrees F oral Pulse rate:   92 / minute Pulse rhythm:   regular BP sitting:   102 / 66  (left arm) Cuff size:   regular  Vitals Entered By: June McMurray Lynn Deborra Medina) (July 02, 2010 1:44 PM)  History of Present Illness Visit Type: Follow-up Visit Primary GI MD: Joylene Igo MD [facility] Primary Provider: Rachel Moulds, DO Requesting Provider: n/a Chief Complaint: abdominal pain/ c-diff positive History of Present Illness:   Patient followed by Dr. Fuller Plan for history of ulcerative colitis and is maintained on [place]. Patient presents for evaluation of C-Difficile colitis. She took course of Cipro for a respiratory illness a few weeks ago. She subsequently developed diarrhea, went to ER yesterday, stool study positive for C-Difficile. Patient was started on Flagyl 550m two times a day. She is having 9-10 BMs a day with blood. She has diffuse abdominal cramps. Bentyl doesn't help. In ER WBC normal at 5.5. Hemoglobin 11.8.    GI Review of Systems    Reports abdominal pain.     Location of  Abdominal pain: generalized.    Denies acid reflux, belching, bloating, chest pain, dysphagia with liquids, dysphagia with solids, heartburn, loss of appetite, nausea, vomiting, vomiting blood, weight loss, and  weight gain.      Reports diarrhea and  rectal bleeding.     Denies anal fissure, black tarry stools, change in bowel habit, constipation, diverticulosis, fecal incontinence, heme positive stool, hemorrhoids, irritable bowel syndrome, jaundice, light color stool, liver problems, and  rectal pain.  Current Medications (verified): 1)  Lialda 1.2 Gm  Tbec (Mesalamine) .... 2 By Mouth Twice Daily 2)  Singulair 10 Mg  Tabs (Montelukast Sodium) .... One Tablet Once Daily 3)  Proair Hfa 108 (90 Base)  Mcg/act  Aers (Albuterol Sulfate) .... As Needed 4)  Multivitamins  Tabs (Multiple Vitamin) .... Take 1 Tablet By Mouth Once A Day 5)  Calcium 600/vitamin D 600-400 Mg-Unit Tabs (Calcium Carbonate-Vitamin D) .... Take 1 Tablet By Mouth Two Times A Day 6)  Omeprazole 40 Mg Cpdr (Omeprazole) .... One Tablet By Mouth Every Morning As Needed 7)  Bentyl 10 Mg Caps (Dicyclomine Hcl) .... 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 Diff colitis 2011 and Feb. 2012  Past Surgical History: Reviewed history from 10/31/2007 and no changes required. Partial Hysterectomy-2002 Tonsillectomy Right Breast Lumpectomy-1980's  Family History: Reviewed history from 10/31/2007 and no changes required. No FH of Colon Cancer: Family History of Diabetes:Father,sisters,brother  Social History: Reviewed history from 10/31/2007 and no changes required. Married Patient is a former smoker. smoked x 10 yrs Alcohol Use - no Daily Caffeine Use- 3 per day Illicit Drug Use - no  Review of Systems  The patient denies allergy/sinus, anemia, anxiety-new, arthritis/joint pain, back pain, blood in urine, breast changes/lumps, change in vision, confusion, cough, coughing up blood, depression-new, fainting, fatigue, fever, headaches-new, hearing problems, heart murmur, heart rhythm changes, itching, menstrual pain,  muscle pains/cramps, night sweats, nosebleeds, pregnancy symptoms, shortness of breath, skin rash, sleeping problems, sore throat, swelling of feet/legs, swollen lymph glands, thirst - excessive , urination - excessive , urination changes/pain, urine leakage, vision changes, and voice  change.    Physical Exam  General:  Well developed, well nourished, no acute distress. Head:  Normocephalic and atraumatic. Eyes:  Conjunctiva pink, no icterus.  Neck:  no obvious masses  Lungs:  Clear throughout to auscultation. Heart:  Regular rate and rhythm; no murmurs, rubs,  or bruits. Abdomen:  Abdomen soft,  nondistended. Moderate diffuse tenderness. No obvious masses or hepatomegaly. Normal bowel sounds.  Msk:  Symmetrical with no gross deformities. Normal posture. Extremities:  No palmar erythema, no edema.  Neurologic:  Alert and  oriented x4;  grossly normal neurologically. Skin:  Intact without significant lesions or rashes. Cervical Nodes:  No significant cervical adenopathy. Psych:  Alert and cooperative. Normal mood and affect.   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 She has moderate diffuse abdominal tenderness on exam today.Stop Flagyl and begin Vancomycin. Add Florastor. Stop Bentyl, will try Levbid instead. Will also call in Hydrocodone that she can take every six hours as needed.   ATTENDING: If fails to respond to Vancomycin then consider Dificid therapy (or if another relapse) Gatha Mayer MD, [facility]  July 04, 2010 11:49 AM  Problem # 2:  ULCERATIVE COLITIS-LEFT SIDE (ICD-556.5) Assessment: Comment Only Continue Lialda. Routine follow up with Dr. Fuller Plan in next couple of months.  Patient Instructions: 1)  Stop Flagyl 2)  Drink several glasses water daily 3)   Take Levbid two times a day. 4)  Stay on a clear liquid diet for next couple of days then advance to low fiber diet until antibiotics complete.  5)  Florastor two times a day, we sent a refill on that to the pharmacy, [facility] in [place]. 6)  We also sent a perscription of Vancomycin 250 mg for 14 days to [facility] in [place] 7)  Call us early next week with a  progress report. 8)  If you have worsening abdominal pain and fever call us right away.  9)  Copy sent to : Rachel Moulds, DO 10)  The medication list was reviewed and reconciled.  All changed / newly prescribed medications were explained.  A complete medication list was provided to the patient / caregiver. Prescriptions: FLORASTOR 250 MG CAPS (SACCHAROMYCES BOULARDII) Take 1 tab twice daily  #60 x 0   Entered by:   Marisue Humble NCMA   Authorized by:   Tye Savoy NP   Signed by:   Marisue Humble NCMA on 07/02/2010   Method used:   Electronically to        [facility]. [phone]* (retail)       [address]       [place], [place]  [address]       Ph: [phone]       Fax: [phone]   RxID:   [id number] VANCOCIN HCL 250 MG CAPS (VANCOMYCIN HCL) Take 1 tab 4 times daily x 14 days  #56 x 0   Entered by:   Marisue Humble NCMA   Authorized by:   Tye Savoy NP   Signed by:   Marisue Humble NCMA on 07/02/2010   Method used:   Electronically to  [facility] (retail)       [address]       [place], [place]  [address]       Ph: [phone]       Fax: [phone]   RxID:   [id number] HYDROCODONE-ACETAMINOPHEN 5-325 MG TABS (HYDROCODONE-ACETAMINOPHEN) Take 1 tab every 6 hours as needed for pain.  Do not drive on this medication.  #30 x 0   Entered by:   Marisue Humble NCMA   Authorized by:   Tye Savoy NP   Signed by:   Marisue Humble NCMA on 07/02/2010   Method used:   Printed then faxed to ...       [facility] [phone]* (retail)       [address] [place], [place]  [address]       Ph: [phone]       Fax: [phone]   RxID:   [id number]

## 2010-07-22 NOTE — Progress Notes (Signed)
Summary: Update  Phone Note Call from Patient Call back at Home Phone [phone]   Caller: Patient Call For: Nevin Bloodgood Reason for Call: Talk to Nurse Summary of Call: Pt would like to speak with Pam to update her on her situation Initial call taken by: Martinique Johnson,  July 06, 2010 4:04 PM  Follow-up for Phone Call        Patient calling to let us know she is feeling better. She was calling yesterday to let us know she had bright, red blood in her stool but today she has not had any. Please, advise. Follow-up by: Leone Payor RN,  July 07, 2010 11:39 AM  Additional Follow-up for Phone Call Additional follow up Details #1::        bleeding may be perianal (internal hemorrhoids, irritation). She should just watch for now. Glad she is feeling better.  Additional Follow-up by: Tye Savoy NP,  July 08, 2010 11:53 AM    Additional Follow-up for Phone Call Additional follow up Details #2::    Spoke with patient who reports she is still bleeding with BM's, only it's dark now not BRB. Patient reports she's usually ok in the daytime, but after she goes to bed, she's up in the wee hours with 2-3 stools a night. Patient is slowing advancing her diet with small frequent meals. She ate eggs and grits this am and is trying to avoid fried foods. Asked patient to call with further problems or worsening. Patient stated understanding. Shella Maxim RN  July 08, 2010 2:01 PM   Additional Follow-up for Phone Call Additional follow up Details #3:: Details for Additional Follow-up Action Taken: Let's see if she can come in to see me early next week with a CBC prior to visit ( I want to make sure she isn't becoming anemic).Thanks Additional Follow-up by: Tye Savoy NP,  July 10, 2010 4:40 PM  Spoke with patient who will come today for labs and in for f/u. Patient still reports diarrhea, but bleeding has leveled off.Shella Maxim RN  July 13, 2010 9:01 AM

## 2010-07-22 NOTE — Assessment & Plan Note (Addendum)
Summary: follow up   History of Present Illness Visit Type: Follow-up Visit Primary GI MD: Joylene Igo MD [facility] Primary Provider: Rachel Moulds, DO Requesting Provider: n/a Chief Complaint: F/u from rectal bleeding and diarrhea. 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 She called in a few days ago with complaints of persistent loose stools with blood.  We was advised to get CBC and come for follow up appointment.  Her hemoglobin was stable. She is now reporting improvement in her symptoms. No abdominal pain. Averaging three loose postprandial BMs a day and maybe one nocturnal BM. No further hematochezia. Has one day of Vancomycin left. No fever.    GI Review of Systems      Denies abdominal pain, acid reflux, belching, bloating, chest pain, dysphagia with liquids, dysphagia with solids, heartburn, loss of appetite, nausea, vomiting, vomiting blood, weight loss, and  weight gain.        Denies anal fissure, black tarry stools, change in bowel habit, constipation, diarrhea, diverticulosis, fecal incontinence, heme positive stool, hemorrhoids, irritable bowel syndrome, jaundice, light color stool, liver problems, rectal bleeding, and  rectal pain.    Current Medications (verified): 1)  Lialda 1.2 Gm  Tbec (Mesalamine) .... 2 By Mouth Twice Daily 2)  Singulair 10 Mg  Tabs (Montelukast Sodium) .... One Tablet Once Daily 3)  Proair Hfa 108 (90 Base) Mcg/act  Aers (Albuterol Sulfate) .... As Needed 4)  Multivitamins  Tabs (Multiple Vitamin) .... Take 1 Tablet By Mouth Once A Day 5)  Calcium 600/vitamin D 600-400 Mg-Unit Tabs (Calcium Carbonate-Vitamin D) .... Take 1 Tablet By Mouth Two Times A Day 6)  Omeprazole 40 Mg Cpdr (Omeprazole) .... One Tablet By Mouth Every Morning As Needed 7)  Hydrocodone-Acetaminophen 5-325 Mg Tabs (Hydrocodone-Acetaminophen) .... Take 1 Tab Every 6  Hours As Needed For Pain.  Do Not Drive On This Medication. 8)  Vancocin Hcl 250 Mg Caps (Vancomycin Hcl) .... Take 1 Tab 4 Times Daily X 14 Days  Allergies (verified): No Known Drug Allergies  Past History:  Past Medical History: Reviewed history from 07/02/2010 and no changes required. 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 Diff colitis 2011 and Feb. 2012  Past Surgical History: Reviewed history from 10/31/2007 and no changes required. Partial Hysterectomy-2002 Tonsillectomy Right Breast Lumpectomy-1980's  Family History: Reviewed history from 10/31/2007 and no changes required. No FH of Colon Cancer: Family History of Diabetes:Father,sisters,brother  Social History: Reviewed history from 10/31/2007 and no changes required. Married Patient is a former smoker. smoked x 10 yrs Alcohol Use - no Daily Caffeine Use- 3 per day Illicit Drug Use - no  Review of Systems  The patient denies allergy/sinus, anemia, anxiety-new, arthritis/joint pain, back pain, blood in urine, breast changes/lumps, change in vision, confusion, cough, coughing up blood, depression-new, fainting, fatigue, fever, headaches-new, hearing problems, heart murmur, heart rhythm changes, itching, menstrual pain, muscle pains/cramps, night sweats, nosebleeds, pregnancy symptoms, shortness of breath, skin rash, sleeping problems, sore throat, swelling of feet/legs, swollen lymph glands, thirst - excessive , urination - excessive , urination changes/pain, urine leakage, vision changes, and voice change.    Vital Signs:  Patient profile:   55 year old female Height:      63 inches Weight:      170  pounds BMI:     30.22 BSA:     1.81 Pulse rate:   88 / minute Pulse rhythm:   regular BP sitting:   100 / 64  (left arm) Cuff size:    regular  Vitals Entered By: Hope Pigeon Ladora (July 14, 2010 8:58 AM)  Physical Exam  General:  Well developed, well nourished, no acute distress. Head:  Normocephalic and atraumatic. Eyes:  Conjunctiva pink, no icterus.  Lungs:  Clear throughout to auscultation. Heart:  RRR Abdomen:  Soft, nondistended, mild diffuse lower abdominal tenderness. Extremities:  No palmar erythema, no edema.  Neurologic:  Alert and  oriented x4;  grossly normal neurologically. Skin:  Intact without significant lesions or rashes. Psych:  Alert and cooperative. Normal mood and affect.   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 Complete Vancomycin as prescribed. Patient looks good, abdominal examination not overly concerning. CBC okay. She can use Lomotil before meals as needed. She should return for her routine follow up for ulcerative colitis with Dr. Fuller Plan in a couple of months.  Problem # 2:  COLITIS, ULCERATIVE (ICD-556.9) Assessment: Comment Only Continue Lialda.   Patient Instructions: 1)   We faxed a signed perscription  of Lomotil to take before meals twice daily as needed #60, 1 refill.  2)  We made you a follow up appointment with Dr. Fuller Plan for 08-26-2010. We cancelled the appt you had for the end of this month. 3)  cc: Dr. Leonel Ramsay 4)  Copy sent to : Rachel Moulds, DO 5)  The medication list was reviewed and reconciled.  All changed / newly prescribed medications were explained.  A complete medication list was provided to the patient / caregiver. Prescriptions: LOMOTIL 2.5-0.025 MG TABS (DIPHENOXYLATE-ATROPINE) Take 1 tab twice daily as needed  #60 x 1   Entered by:   Marisue Humble NCMA   Authorized by:   Tye Savoy NP   Signed by:   Marisue Humble NCMA on 07/14/2010   Method used:   Printed then faxed to ...       [facility] [phone]* (retail)       [address] [place], [place]  [address]       Ph: [phone]       Fax: [phone]   RxID:   [id number]

## 2010-07-23 ENCOUNTER — Ambulatory Visit: Payer: Managed Care, Other (non HMO) | Admitting: Gastroenterology

## 2010-08-13 LAB — CBC
HCT: 32.3 % — ABNORMAL LOW (ref 36.0–46.0)
HCT: 33.2 % — ABNORMAL LOW (ref 36.0–46.0)
HCT: 34.6 % — ABNORMAL LOW (ref 36.0–46.0)
HCT: 37.4 % (ref 36.0–46.0)
Hemoglobin: 10.9 g/dL — ABNORMAL LOW (ref 12.0–15.0)
Hemoglobin: 11.2 g/dL — ABNORMAL LOW (ref 12.0–15.0)
Hemoglobin: 11.7 g/dL — ABNORMAL LOW (ref 12.0–15.0)
Hemoglobin: 12.8 g/dL (ref 12.0–15.0)
MCH: 30.2 pg (ref 26.0–34.0)
MCH: 30.3 pg (ref 26.0–34.0)
MCH: 30.5 pg (ref 26.0–34.0)
MCH: 30.7 pg (ref 26.0–34.0)
MCHC: 33.8 g/dL (ref 30.0–36.0)
MCHC: 33.8 g/dL (ref 30.0–36.0)
MCHC: 34 g/dL (ref 30.0–36.0)
MCHC: 34.3 g/dL (ref 30.0–36.0)
MCV: 88.8 fL (ref 78.0–100.0)
MCV: 89.4 fL (ref 78.0–100.0)
MCV: 89.5 fL (ref 78.0–100.0)
MCV: 90.2 fL (ref 78.0–100.0)
Platelets: 320 10*3/uL (ref 150–400)
Platelets: 323 10*3/uL (ref 150–400)
Platelets: 330 10*3/uL (ref 150–400)
Platelets: 380 10*3/uL (ref 150–400)
RBC: 3.58 MIL/uL — ABNORMAL LOW (ref 3.87–5.11)
RBC: 3.71 MIL/uL — ABNORMAL LOW (ref 3.87–5.11)
RBC: 3.89 MIL/uL (ref 3.87–5.11)
RBC: 4.18 MIL/uL (ref 3.87–5.11)
RDW: 13.2 % (ref 11.5–15.5)
RDW: 13.5 % (ref 11.5–15.5)
RDW: 13.6 % (ref 11.5–15.5)
RDW: 13.7 % (ref 11.5–15.5)
WBC: 4.1 10*3/uL (ref 4.0–10.5)
WBC: 4.6 10*3/uL (ref 4.0–10.5)
WBC: 4.8 10*3/uL (ref 4.0–10.5)
WBC: 6.2 10*3/uL (ref 4.0–10.5)

## 2010-08-13 LAB — DIFFERENTIAL
Basophils Absolute: 0 10*3/uL (ref 0.0–0.1)
Basophils Relative: 1 % (ref 0–1)
Eosinophils Absolute: 0.5 10*3/uL (ref 0.0–0.7)
Eosinophils Relative: 11 % — ABNORMAL HIGH (ref 0–5)
Lymphocytes Relative: 28 % (ref 12–46)
Lymphs Abs: 1.3 10*3/uL (ref 0.7–4.0)
Monocytes Absolute: 0.5 10*3/uL (ref 0.1–1.0)
Monocytes Relative: 10 % (ref 3–12)
Neutro Abs: 2.4 10*3/uL (ref 1.7–7.7)
Neutrophils Relative %: 51 % (ref 43–77)

## 2010-08-13 LAB — GLUCOSE, CAPILLARY
Glucose-Capillary: 107 mg/dL — ABNORMAL HIGH (ref 70–99)
Glucose-Capillary: 108 mg/dL — ABNORMAL HIGH (ref 70–99)
Glucose-Capillary: 111 mg/dL — ABNORMAL HIGH (ref 70–99)
Glucose-Capillary: 115 mg/dL — ABNORMAL HIGH (ref 70–99)
Glucose-Capillary: 120 mg/dL — ABNORMAL HIGH (ref 70–99)
Glucose-Capillary: 169 mg/dL — ABNORMAL HIGH (ref 70–99)
Glucose-Capillary: 222 mg/dL — ABNORMAL HIGH (ref 70–99)
Glucose-Capillary: 81 mg/dL (ref 70–99)
Glucose-Capillary: 98 mg/dL (ref 70–99)
Glucose-Capillary: 98 mg/dL (ref 70–99)
Glucose-Capillary: 99 mg/dL (ref 70–99)

## 2010-08-13 LAB — POCT I-STAT, CHEM 8
BUN: 12 mg/dL (ref 6–23)
Calcium, Ion: 1.19 mmol/L (ref 1.12–1.32)
Chloride: 110 mEq/L (ref 96–112)
Creatinine, Ser: 0.7 mg/dL (ref 0.4–1.2)
Glucose, Bld: 80 mg/dL (ref 70–99)
HCT: 41 % (ref 36.0–46.0)
Hemoglobin: 13.9 g/dL (ref 12.0–15.0)
Potassium: 3.8 mEq/L (ref 3.5–5.1)
Sodium: 143 mEq/L (ref 135–145)
TCO2: 23 mmol/L (ref 0–100)

## 2010-08-13 LAB — URINE CULTURE
Colony Count: NO GROWTH
Culture  Setup Time: 201109102022
Culture: NO GROWTH

## 2010-08-13 LAB — COMPREHENSIVE METABOLIC PANEL
ALT: 17 U/L (ref 0–35)
AST: 16 U/L (ref 0–37)
Albumin: 3.8 g/dL (ref 3.5–5.2)
Alkaline Phosphatase: 88 U/L (ref 39–117)
BUN: 7 mg/dL (ref 6–23)
CO2: 24 mEq/L (ref 19–32)
Calcium: 9.1 mg/dL (ref 8.4–10.5)
Chloride: 113 mEq/L — ABNORMAL HIGH (ref 96–112)
Creatinine, Ser: 0.64 mg/dL (ref 0.4–1.2)
GFR calc Af Amer: >60 mL/min (ref >60)
GFR calc non Af Amer: >60 mL/min (ref >60)
Glucose, Bld: 111 mg/dL — ABNORMAL HIGH (ref 70–99)
Potassium: 4.3 mEq/L (ref 3.5–5.1)
Sodium: 141 mEq/L (ref 135–145)
Total Bilirubin: 0.8 mg/dL (ref 0.3–1.2)
Total Protein: 6.8 g/dL (ref 6.0–8.3)

## 2010-08-13 LAB — URINALYSIS, ROUTINE W REFLEX MICROSCOPIC
Bilirubin Urine: NEGATIVE
Glucose, UA: NEGATIVE mg/dL
Hgb urine dipstick: NEGATIVE
Ketones, ur: NEGATIVE mg/dL
Nitrite: NEGATIVE
Protein, ur: NEGATIVE mg/dL
Specific Gravity, Urine: 1.029 (ref 1.005–1.030)
Urobilinogen, UA: 0.2 mg/dL (ref 0.0–1.0)
pH: 5 (ref 5.0–8.0)

## 2010-08-13 LAB — HEMOCCULT GUIAC POC 1CARD (OFFICE): Fecal Occult Bld: POSITIVE

## 2010-08-13 LAB — CLOSTRIDIUM DIFFICILE EIA

## 2010-08-13 LAB — CROSSMATCH
ABO/RH(D): B POS
Antibody Screen: NEGATIVE

## 2010-08-13 LAB — PROTIME-INR
INR: 1.07 (ref 0.00–1.49)
Prothrombin Time: 14.1 seconds (ref 11.6–15.2)

## 2010-08-13 LAB — HEPATIC FUNCTION PANEL
ALT: 20 U/L (ref 0–35)
AST: 22 U/L (ref 0–37)
Albumin: 4.3 g/dL (ref 3.5–5.2)
Alkaline Phosphatase: 91 U/L (ref 39–117)
Bilirubin, Direct: 0.2 mg/dL (ref 0.0–0.3)
Indirect Bilirubin: 0.4 mg/dL (ref 0.3–0.9)
Total Bilirubin: 0.6 mg/dL (ref 0.3–1.2)
Total Protein: 7.6 g/dL (ref 6.0–8.3)

## 2010-08-13 LAB — OVA AND PARASITE EXAMINATION

## 2010-08-13 LAB — SAMPLE TO BLOOD BANK

## 2010-08-13 LAB — ABO/RH: ABO/RH(D): B POS

## 2010-08-26 ENCOUNTER — Ambulatory Visit (INDEPENDENT_AMBULATORY_CARE_PROVIDER_SITE_OTHER): Payer: Managed Care, Other (non HMO) | Admitting: Gastroenterology

## 2010-08-26 ENCOUNTER — Encounter: Payer: Self-pay | Admitting: Gastroenterology

## 2010-08-26 VITALS — BP 104/70 | HR 60 | Wt 171.0 lb

## 2010-08-26 DIAGNOSIS — A0472 Enterocolitis due to Clostridium difficile, not specified as recurrent: Secondary | ICD-10-CM

## 2010-08-26 DIAGNOSIS — K219 Gastro-esophageal reflux disease without esophagitis: Secondary | ICD-10-CM

## 2010-08-26 DIAGNOSIS — K5150 Left sided colitis without complications: Secondary | ICD-10-CM

## 2010-08-26 NOTE — Assessment & Plan Note (Signed)
Continue omeprazole as needed and anti-reflex measures.

## 2010-08-26 NOTE — Patient Instructions (Signed)
Cc: Rachel Moulds, DO

## 2010-08-26 NOTE — Assessment & Plan Note (Signed)
She has completed a course of vancomycin and her diarrhea completely resolved 2 weeks ago.

## 2010-08-26 NOTE — Assessment & Plan Note (Signed)
Currently asymptomatic. Continue we Lialda 2 tablets every morning. Return office visit 6 months.

## 2010-08-26 NOTE — Progress Notes (Addendum)
.  History of Present Illness: This is a 55 year old female who returns for followup of C. difficile diarrhea and left-sided ulcerative colitis. She completed a course of vancomycin about 2 weeks ago and has had no diarrhea approximately 2 weeks. She notes no abdominal pain,melena or hematochezia. She takes omeprazole as needed for reflux symptoms. She currently has no gastrointestinal complaints and feels very well.  Past Medical History  Diagnosis Date  . Internal hemorrhoids   . Ulcerative colitis, left sided 1998  . Asthma   . Anemia   . Hyperthyroidism   . C. difficile colitis 2011 and 07/2010  . GERD (gastroesophageal reflux disease)   . Hyperplastic polyp of intestine 09/2007   Past Surgical History  Procedure Date  . Partial hysterectomy 2002  . Tonsillectomy   . Breast lumpectomy 1980's    Right Breast    reports that she has quit smoking. Her smoking use included Cigarettes. She quit after 10 years of use. She does not have any smokeless tobacco history on file. She reports that she does not drink alcohol or use illicit drugs. family history includes Diabetes in her brother, father, and sister. No Known Allergies    Outpatient Encounter Prescriptions as of 08/26/2010  Medication Sig Dispense Refill  . albuterol (PROAIR HFA) 108 (90 BASE) MCG/ACT inhaler Inhale 2 puffs into the lungs every 6 (six) hours as needed.        . Calcium Carbonate-Vitamin D (CALCIUM 600+D) 600-400 MG-UNIT per tablet Take 1 tablet by mouth 2 (two) times daily.        . mesalamine (LIALDA) 1.2 G EC tablet Take 1,200 mg by mouth. 2 tablets by mouth once daily.      . montelukast (SINGULAIR) 10 MG tablet Take 10 mg by mouth at bedtime.        . Multiple Vitamin (MULTIVITAMIN) tablet Take 1 tablet by mouth daily.        Marland Kitchen omeprazole (PRILOSEC) 40 MG capsule Take 40 mg by mouth daily as needed.        . diphenoxylate-atropine (LOMOTIL) 2.5-0.025 MG per tablet Take 1 tablet by mouth 2 (two) times daily as  needed.        Marland Kitchen HYDROcodone-acetaminophen (NORCO) 5-325 MG per tablet Take 1 tablet by mouth every 6 (six) hours as needed.         Physical Exam: General: Well developed , well nourished, no acute distress Head: Normocephalic and atraumatic Eyes:  sclerae anicteric, EOMI Ears: Normal auditory acuity Mouth: No deformity or lesions Lungs: Clear throughout to auscultation Heart: Regular rate and rhythm; no murmurs, rubs or bruits Abdomen: Soft, non tender and non distended. No masses, hepatosplenomegaly or hernias noted. Normal Bowel sounds Musculoskeletal: Symmetrical with no gross deformities  Extremities: No clubbing, cyanosis, edema or deformities noted Neurological: Alert oriented x 4, grossly nonfocal Psychological:  Alert and cooperative. Normal mood and affect  Assessment and Recommendations:

## 2010-08-28 ENCOUNTER — Telehealth: Payer: Self-pay | Admitting: Gastroenterology

## 2010-08-28 NOTE — Telephone Encounter (Signed)
Left message for patient to call back  

## 2010-08-31 NOTE — Telephone Encounter (Signed)
I returned call to the patietn again. She called on Friday to report that she had blood in her stool for 2 days prior, but no bleeding since.  She is asked to keep her 4/10 pre-visit and 4/17 colonoscopy appointments.  She will call back for any further questions or problems.

## 2010-09-08 ENCOUNTER — Encounter: Payer: Self-pay | Admitting: Gastroenterology

## 2010-09-08 ENCOUNTER — Ambulatory Visit (AMBULATORY_SURGERY_CENTER): Payer: Managed Care, Other (non HMO)

## 2010-09-08 VITALS — Ht 63.0 in | Wt 171.1 lb

## 2010-09-08 DIAGNOSIS — K625 Hemorrhage of anus and rectum: Secondary | ICD-10-CM

## 2010-09-08 MED ORDER — PEG-KCL-NACL-NASULF-NA ASC-C 100 G PO SOLR: 1.0000 | Freq: Once | ORAL | Status: AC

## 2010-09-15 ENCOUNTER — Encounter: Payer: Self-pay | Admitting: Gastroenterology

## 2010-09-15 ENCOUNTER — Ambulatory Visit (AMBULATORY_SURGERY_CENTER): Payer: Managed Care, Other (non HMO) | Admitting: Gastroenterology

## 2010-09-15 VITALS — BP 114/78 | HR 80 | Temp 97.2°F | Resp 18 | Ht 63.0 in | Wt 171.0 lb

## 2010-09-15 DIAGNOSIS — K620 Anal polyp: Secondary | ICD-10-CM

## 2010-09-15 DIAGNOSIS — K5289 Other specified noninfective gastroenteritis and colitis: Secondary | ICD-10-CM

## 2010-09-15 DIAGNOSIS — D126 Benign neoplasm of colon, unspecified: Secondary | ICD-10-CM

## 2010-09-15 DIAGNOSIS — Z1211 Encounter for screening for malignant neoplasm of colon: Secondary | ICD-10-CM

## 2010-09-15 DIAGNOSIS — K5190 Ulcerative colitis, unspecified, without complications: Secondary | ICD-10-CM

## 2010-09-15 DIAGNOSIS — K625 Hemorrhage of anus and rectum: Secondary | ICD-10-CM

## 2010-09-15 DIAGNOSIS — K621 Rectal polyp: Secondary | ICD-10-CM

## 2010-09-15 MED ORDER — SODIUM CHLORIDE 0.9 % IV SOLN: 500.0000 mL | INTRAVENOUS | Status: DC

## 2010-09-15 NOTE — Patient Instructions (Signed)
Continue your medications.  Follow discharge instructions.  You will receive a letter from Dr. Fuller Plan regarding pathlogy results.

## 2010-09-16 ENCOUNTER — Telehealth: Payer: Self-pay | Admitting: *Deleted

## 2010-09-16 NOTE — Telephone Encounter (Signed)

## 2010-09-21 ENCOUNTER — Encounter: Payer: Self-pay | Admitting: Gastroenterology

## 2010-10-13 NOTE — Assessment & Plan Note (Signed)
[facility] OFFICE NOTE   Brianna Santana, Brianna Santana                      MRN:          [id number]  DATE:09/27/2007                            DOB:          1956-03-01    REFERRING PHYSICIAN:  Darcus Santana, D.O.   REASON FOR CONSULTATION:  Chronic diarrhea, small volume hematochezia  and lower abdominal pain.   HISTORY OF PRESENT ILLNESS:  Brianna Santana is a 55 year old African American  female who was previously followed by Dr. Katherina Mires.  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  There was granular  appearance to the rectal mucosa.  The remainder of the colonoscopy is  unremarkable.  Biopsies reveal severe acute colitis and a nonspecific  acute colitis was favored.  Inflammatory bowel disease could not be  excluded, and clinical followup was recommended.  She states that over  the years she has had problems with intermittent diarrhea.  These  symptoms have clearly worsened over the past year.  She notes multiple  foods that exacerbate her diarrhea, including fish, chocolate, fried  foods and milk.  She notes crampy lower abdominal pain and intermittent  small volume hematochezia for the past year.  Her diarrhea often follows  meals.  She is concerned that she may have irritable bowel syndrome.  Her appetite is good and her weight is stable.  She has rare episodes of  reflux, brought on by certain foods.  These symptoms occur on the order  of once a month.  She states she had stool Hemoccults performed in Dr.  Quillian Quince L. Gottsegen's office earlier this year, that were negative.  She  was recently seen by Dr. Darcus Santana and is referred for further  evaluation.   FAMILY HISTORY:  Negative for colon cancer, colon polyps and  inflammatory bowel disease.   PAST MEDICAL HISTORY:  1. Proctitis.  2. Anemia.  3. Asthma.   PAST SURGICAL HISTORY:  1. Status post partial hysterectomy in  2002.  2. Status post tonsillectomy at age 35.  3. Status post right breast lumpectomy in the 1980's.   SOCIAL HISTORY/REVIEW OF SYSTEMS:  Per the handwritten form.   PHYSICAL EXAMINATION:  GENERAL:  A well-developed and well-nourished  female, in no acute distress.  VITAL SIGNS:  Height 5 feet 3 inches, weight 164 pounds, blood pressure  98/66, pulse 76 and regular.  HEENT:  Anicteric sclerae.  Oropharynx clear.  CHEST:  Clear to auscultation bilaterally.  HEART:  A regular rate and rhythm without murmurs appreciated.  ABDOMEN:  Soft, nontender, nondistended.  Normoactive bowel sounds.  No  palpable organomegaly, masses or herniae.  RECTAL:  Deferred to colonoscopy.  EXTREMITIES:  Without clubbing, cyanosis or edema.  NEUROLOGIC:  Alert and oriented x3.  Grossly nonfocal.   ASSESSMENT/PLAN:  Chronic diarrhea, intermittent hematochezia, lower  abdominal pain and a history of proctitis in 1998.  Rule out  inflammatory bowel disease, irritable bowel syndrome, colorectal  neoplasms and other disorders.  The risks, benefits and alternatives to  colonoscopy with possible biopsy and possible polypectomy discussed with  the  patient.  She consents to proceed and this will be scheduled  electively.     Pricilla Riffle. Fuller Plan, MD, [facility]  Electronically Signed    MTS/MedQ  DD: 09/27/2007  DT: 09/27/2007  Job #: [id number]   cc:   Darcus Santana, D.O.

## 2010-10-16 NOTE — Op Note (Signed)
[facility]  Patient:    Brianna Santana, Brianna Santana Visit Number: [id number] MRN: [id number]          Service Type: GYN Location: [address] [place] Attending Physician:  Aram Beecham Proc. Date: 02/06/01 Admit Date:  02/06/2001                             Operative Report  PREOPERATIVE DIAGNOSIS:  Symptomatic fibroids.  POSTOPERATIVE DIAGNOSIS:  Symptomatic fibroids.  OPERATION:  Total abdominal hysterectomy.  SURGEON:  Daniel L. Cherylann Banas, M.D.  FIRST ASSISTANT:  Rodolph Bong, M.D.  ANESTHESIA:  General.  FINDINGS:  The patients uterus was enlarged by multiple myomas to about 12+ weeks size.  Ovaries, tubes, and pelvic peritoneum were free of disease.  The ileocecal junction was identified.  The appendix was normal.  The rest of the exploration of the abdomen was normal.  DESCRIPTION OF PROCEDURE:  After adequate general endotracheal anesthesia, the patient was placed in the supine position and prepped and draped in the usual sterile manner.  A Foley catheter was inserted in the patients bladder.  A Pfannenstiel incision was made.  The fascia was opened transversely.  The peritoneum was entered vertically.  Subcutaneous bleeders were clamped and Bovied as encountered.  When the peritoneal cavity was opened, the above findings were noted.  The round ligaments were Bovied and cut, and then the vesicouterine fold to peritoneum was sharply incised.  The uteroovarian ligaments, round ligaments, and fallopian tubes were clamped, cut, and double suture ligated with #1 chromic catgut.  The posterior peritoneum was sharply incised.  The bladder flap was advanced, and then the uterine arteries were clamped, cut, and double suture ligated with #1 chromic catgut.  The parametrium was taken down in successive bites by clamping, cutting, and suture ligating with #1 chromic catgut.  The cervicovaginal junction was identified, entered with sharp dissection,  and the uterus was sent to pathology for tissue diagnosis.  Angled sutures were placed in the angles of the vagina, incorporating uterosacral ligaments and cardinal ligaments for good vault support with #1 chromic catgut.  The cuff was closed with figure-of-eights of 0 Vicryl.  Two sponge, needle, and instrument counts were correct.  The peritoneal cavity was closed with a running 0 Vicryl. Subfascial and suprafascial spaces were copiously irrigated with Ringers Lactate, and then the fascia was closed with two running 0 Vicryl.  The skin was closed with staples.  Estimated blood loss for the entire procedure was 200 cc with none replaced.  The patient tolerated the procedure well and left the operating room in satisfactory condition draining clear urine from her Foley catheter. Attending Physician:  Aram Beecham DD:  02/06/01 TD:  02/06/01 Job: [id number]

## 2010-10-16 NOTE — H&P (Signed)
[facility]  Patient:    Brianna Santana, Brianna Santana Visit Number: [id number] MRN: [id number]          Service Type: Attending:  Cherly Anderson. Cherylann Banas, M.D. Dictated by:   Cherly Anderson. Cherylann Banas, M.D.                           History and Physical  CHIEF COMPLAINT:   Symptomatic fibroids.  HISTORY OF PRESENT ILLNESS:  This patient is a 55 year old, gravida 3, para 3, Ab 0, who over the past year has had progressively increasing menorrhagia with fibroids and anemia. Fibroids are not confined to the intrauterine cavity. As a result of the above, a decision was made to proceed with hysterectomy. The patient was too anemic to proceed and therefore she was given Lupron Depot. She was made amenorrheic and at that point was able to build her iron back up to a safe level to proceed with her surgery. However, at that point, as a result of the Lupron Depot and feeling good off of it, she did not want to go ahead and have her surgery, even understanding that this would probably reoccur. The Lupron Depot wore off and exactly the above happened. Her menorrhagia has persisted. It is getting worse. In addition, she is now having bladder symptoms from pressure from the myomas. She has a negative urine but she is having to go the bathroom at least 20 times a day and that coincides with the myomas. We have discussed the pros and cons of removing her ovaries and she has elected to keep them. She therefore will undergo a total abdominal hysterectomy and we will only remove her ovaries for significant disease.  PAST MEDICAL HISTORY:  History of sinus trouble, history of previous breast surgery for benign lesion.  PRESENT MEDICATIONS:  Megace to control the bleeding until we do her surgery and iron.  ALLERGIES:  No known drug allergies.  FAMILY HISTORY:  She has sisters with heart disease, sisters with diabetes, and one sister with hypertension.  REVIEW OF SYSTEMS:  HEENT is negative.  CARDIAC: negative. RESPIRATORY: Negative except for sinusitis. GI is negative. GU is negative except for urinary frequency. NEUROLOGICAL: Negative. PSYCHIATRIC: Negative. IMMUNOLOGICAL, LYMPHATIC, and ENDOCRINE: Negative.  PHYSICAL EXAMINATION:  GENERAL:  Well-developed, well-nourished female in no acute distress.  VITAL SIGNS:  Blood pressure is 130/70, pulse is 80 and regular, respirations unlabored. She is afebrile.  HEENT:  Within normal limits.  NECK:  Supple. Trachea in the midline. Thyroid is not enlarged.  LUNGS:  Clear to P&A.  HEART:  No thrills, heaves, or murmurs.  BREASTS:  No masses.  ABDOMEN:  Soft without guarding, rebound, or masses.  PELVIC:  External and vaginal is within normal limits. Cervix is clean. Pap smear shows no atypia. Uterus is enlarged by myomas almost three times normal size at about 12 weeks. Adnexa failed to reveal any masses. Rectovaginal is confirmatory.  EXTREMITIES:  Within normal limits.  ADMISSION IMPRESSION:  Symptomatic fibroids with both pressure symptoms as well as menorrhagia with anemia.  PLAN:  Total abdominal hysterectomy. Dictated by:   Cherly Anderson. Cherylann Banas, M.D. Attending:  Cherly Anderson. Cherylann Banas, M.D. DD:  02/06/01 TD:  02/06/01 Job: [id number]

## 2010-10-16 NOTE — Discharge Summary (Signed)
[facility]  Patient:    Brianna Santana, Brianna Santana Visit Number: [id number] MRN: [id number]          Service Type: GYN Location: [address] [place] Attending Physician:  Aram Beecham Dictated by:   Cherly Anderson. Cherylann Banas, M.D. Admit Date:  02/06/2001 Discharge Date: 02/08/2001                             Discharge Summary  HOSPITAL COURSE:  The patient is a 55 year old female who was admitted to the hospital with leiomyoma associated with pelvic pain, dysfunctional uterine bleeding and anemia. On the day of admission, September 9, she was taken to the operating room and a total abdominal hysterectomy was performed.  Postoperatively, she did well. By the second postoperative day, she was passing gas, was voiding, and was ready for discharge.  DISCHARGE MEDICATIONS:  Tylox for pain relief and Restoril for sleep.  DISCHARGE FOLLOWUP:  The patient was instructed to return to the office in two days for staple removal.  CONDITION ON DISCHARGE:  Improved.  DIET ON DISCHARGE:  Soft to advance as tolerated.  ACTIVITY ON DISCHARGE:  Ambulatory.  FINAL PATHOLOGY REPORT:  Final pathology report revealed leiomyoma and adenomyosis with a uterus weighing 207 g. There was also an endocervical polyp.  DISCHARGE DIAGNOSES: 1. Leiomyomata. 2. Adenomyosis. 3. Menorrhagia with anemia. 4. Endocervical polyp.  OPERATION:  Total abdominal hysterectomy. Dictated by:   Cherly Anderson. Cherylann Banas, M.D. Attending Physician:  Aram Beecham DD:  02/22/01 TD:  02/22/01 Job: [phone]

## 2010-11-11 ENCOUNTER — Other Ambulatory Visit: Payer: Self-pay | Admitting: Physician Assistant

## 2010-11-12 ENCOUNTER — Other Ambulatory Visit: Payer: Self-pay | Admitting: Gastroenterology

## 2010-11-12 MED ORDER — MESALAMINE 1.2 G PO TBEC: DELAYED_RELEASE_TABLET | ORAL | Status: DC

## 2010-11-12 NOTE — Telephone Encounter (Signed)
Prescription has been sent to the pharmacy.

## 2010-12-01 ENCOUNTER — Ambulatory Visit (INDEPENDENT_AMBULATORY_CARE_PROVIDER_SITE_OTHER): Payer: Managed Care, Other (non HMO) | Admitting: Gynecology

## 2010-12-01 DIAGNOSIS — N898 Other specified noninflammatory disorders of vagina: Secondary | ICD-10-CM

## 2010-12-01 DIAGNOSIS — N952 Postmenopausal atrophic vaginitis: Secondary | ICD-10-CM

## 2010-12-01 DIAGNOSIS — L293 Anogenital pruritus, unspecified: Secondary | ICD-10-CM

## 2010-12-01 DIAGNOSIS — B373 Candidiasis of vulva and vagina: Secondary | ICD-10-CM

## 2011-04-02 ENCOUNTER — Other Ambulatory Visit: Payer: Self-pay | Admitting: Obstetrics and Gynecology

## 2011-04-02 DIAGNOSIS — Z1231 Encounter for screening mammogram for malignant neoplasm of breast: Secondary | ICD-10-CM

## 2011-05-17 ENCOUNTER — Ambulatory Visit: Payer: Managed Care, Other (non HMO)

## 2011-05-24 ENCOUNTER — Other Ambulatory Visit: Payer: Self-pay | Admitting: Gastroenterology

## 2011-05-24 MED ORDER — OMEPRAZOLE 40 MG PO CPDR: 40.0000 mg | DELAYED_RELEASE_CAPSULE | Freq: Every day | ORAL | Status: DC | PRN

## 2011-05-24 NOTE — Telephone Encounter (Signed)
Pt needs Prilosec refill sent to [facility]. Refill sent for patient and patient notified.

## 2011-06-09 ENCOUNTER — Ambulatory Visit
Admission: RE | Admit: 2011-06-09 | Discharge: 2011-06-09 | Disposition: A | Discharge status: Home / Self Care | Payer: Managed Care, Other (non HMO) | Source: Ambulatory Visit | Attending: Obstetrics and Gynecology | Admitting: Obstetrics and Gynecology

## 2011-06-09 DIAGNOSIS — Z1231 Encounter for screening mammogram for malignant neoplasm of breast: Secondary | ICD-10-CM

## 2011-06-10 ENCOUNTER — Encounter (HOSPITAL_COMMUNITY): Payer: Self-pay | Admitting: Emergency Medicine

## 2011-06-10 ENCOUNTER — Emergency Department (INDEPENDENT_AMBULATORY_CARE_PROVIDER_SITE_OTHER): Payer: Managed Care, Other (non HMO)

## 2011-06-10 ENCOUNTER — Emergency Department (INDEPENDENT_AMBULATORY_CARE_PROVIDER_SITE_OTHER)
Admission: EM | Admit: 2011-06-10 | Discharge: 2011-06-10 | Disposition: A | Discharge status: Home / Self Care | Payer: Managed Care, Other (non HMO) | Source: Home / Self Care | Attending: Emergency Medicine | Admitting: Emergency Medicine

## 2011-06-10 DIAGNOSIS — R059 Cough, unspecified: Secondary | ICD-10-CM

## 2011-06-10 DIAGNOSIS — R05 Cough: Secondary | ICD-10-CM

## 2011-06-10 DIAGNOSIS — R053 Chronic cough: Secondary | ICD-10-CM

## 2011-06-10 MED ORDER — HYDROCOD POLST-CHLORPHEN POLST 10-8 MG/5ML PO LQCR: 5.0000 mL | Freq: Two times a day (BID) | ORAL | Status: DC | PRN

## 2011-06-10 MED ORDER — BUDESONIDE-FORMOTEROL FUMARATE 160-4.5 MCG/ACT IN AERO: 2.0000 | INHALATION_SPRAY | Freq: Two times a day (BID) | RESPIRATORY_TRACT | Status: DC

## 2011-06-10 NOTE — ED Provider Notes (Signed)
Chief Complaint  Patient presents with  . URI    History of Present Illness:  She has had a four-month history of a chronic cough. This is productive of small amounts of clear sputum. She denies any wheezing but does have a history of asthma. She is using pro-air. She saw her primary care Dr. when this first began in October and was prescribed a Z-Pak, benzonatate, prednisone, and hydrocodone. She saw her primary care doctor again on December 17 and just 4 days ago. She was given about the same medications including Levaquin, prednisone, Singulair and hydrocodone. She feels no better today. She has a headache when she coughs. She describes nasal congestion and rhinorrhea. She has had some postnasal drainage. No sore throat or hoarseness. She does have a history of reflux. She takes Prilosec about every other day.  Review of Systems:  Other than noted above, the patient denies any of the following symptoms. Systemic:  No fever, chills, sweats, fatigue, myalgias, headache, or anorexia. Eye:  No redness, pain or drainage. ENT:  No earache, nasal congestion, rhinorrhea, sinus pressure, or sore throat. Lungs:  No cough, sputum production, wheezing, shortness of breath. Or chest pain. GI:  No nausea, vomiting, abdominal pain or diarrhea. Skin:  No rash or itching.  [place]:  Past medical history, family history, social history, meds, and allergies were reviewed.  Physical Exam:   Vital signs:  BP 144/87  Pulse 92  Temp(Src) 98.6 F (37 C) (Oral)  Resp 22  SpO2 98% General:  Alert, in no distress. Eye:  No conjunctival injection or drainage. ENT:  TMs and canals were normal, without erythema or inflammation.  Nasal mucosa was clear and uncongested, without drainage.  Mucous membranes were moist.  Pharynx was erythematous, without exudate or drainage.  There were no oral ulcerations or lesions. Neck:  Supple, no adenopathy, tenderness or mass. Lungs:  No respiratory distress.  Lungs were clear to  auscultation, without wheezes, rales or rhonchi.  Breath sounds were clear and equal bilaterally. Heart:  Regular rhythm, without gallops, murmers or rubs. Skin:  Clear, warm, and dry, without rash or lesions.  Labs:    Radiology:  Dg Chest 2 View  06/10/2011  *RADIOLOGY REPORT*  Clinical Data: Cough, chronic cough  CHEST - 2 VIEW  Comparison: Chest radiograph 06/08/2010  Findings: Normal mediastinum and cardiac silhouette.  No effusion, infiltrate, or pneumothorax. No acute osseous abnormality.  IMPRESSION: No acute cardiopulmonary process.  Original Report Authenticated By: Suzy Bouchard, M.D.   Assessment:  Chronic cough probably due to combination of factors including allergy, postnasal drip, acid reflux, and asthma.   Plan:   1.  The following meds were prescribed:   New Prescriptions   BUDESONIDE-FORMOTEROL (SYMBICORT) 160-4.5 MCG/ACT INHALER    Inhale 2 puffs into the lungs 2 (two) times daily.   CHLORPHENIRAMINE-HYDROCODONE (TUSSIONEX) 10-8 MG/5ML LQCR    Take 5 mLs by mouth every 12 (twelve) hours as needed.   2.  The patient was instructed in symptomatic care and handouts were given. 3.  The patient was told to return if becoming worse in any way, if no better in 3 or 4 days, and given some red flag symptoms that would indicate earlier return. 4.  She was urged to followup with her allergist.   Birdena Crandall, MD 06/10/11 2212

## 2011-06-10 NOTE — ED Notes (Signed)
C/o bad cough, chest hurting with cough, headache, symptoms for months.  Saw physician in December and treated with prednisone, hydrocodone, sinus medicine.  Has seen another physician this past Monday and started prednisone, antibiotic, sinus medicine.  Patient is tired of coughing

## 2011-06-22 DIAGNOSIS — E0590 Thyrotoxicosis, unspecified without thyrotoxic crisis or storm: Secondary | ICD-10-CM | POA: Insufficient documentation

## 2011-07-16 ENCOUNTER — Ambulatory Visit (INDEPENDENT_AMBULATORY_CARE_PROVIDER_SITE_OTHER): Payer: Managed Care, Other (non HMO) | Admitting: Obstetrics and Gynecology

## 2011-07-16 ENCOUNTER — Encounter: Payer: Self-pay | Admitting: Obstetrics and Gynecology

## 2011-07-16 ENCOUNTER — Other Ambulatory Visit (HOSPITAL_COMMUNITY)
Admission: RE | Admit: 2011-07-16 | Discharge: 2011-07-16 | Disposition: A | Discharge status: Home / Self Care | Payer: Managed Care, Other (non HMO) | Source: Ambulatory Visit | Attending: Obstetrics and Gynecology | Admitting: Obstetrics and Gynecology

## 2011-07-16 VITALS — BP 120/74 | Ht 64.0 in | Wt 179.0 lb

## 2011-07-16 DIAGNOSIS — Z01419 Encounter for gynecological examination (general) (routine) without abnormal findings: Secondary | ICD-10-CM | POA: Insufficient documentation

## 2011-07-16 DIAGNOSIS — N879 Dysplasia of cervix uteri, unspecified: Secondary | ICD-10-CM | POA: Insufficient documentation

## 2011-07-16 LAB — URINALYSIS W MICROSCOPIC + REFLEX CULTURE
Bacteria, UA: NONE SEEN
Casts: NONE SEEN
Crystals: NONE SEEN
Glucose, UA: NEGATIVE mg/dL
Hgb urine dipstick: NEGATIVE
Leukocytes, UA: NEGATIVE
Nitrite: NEGATIVE
Protein, ur: 30 mg/dL — AB
RBC / HPF: NONE SEEN RBC/hpf (ref <3)
Specific Gravity, Urine: >1.03 — ABNORMAL HIGH (ref 1.005–1.030)
Urobilinogen, UA: 0.2 mg/dL (ref 0.0–1.0)
pH: 5.5 (ref 5.0–8.0)

## 2011-07-16 NOTE — Progress Notes (Signed)
Patient came to see me today for her annual GYN exam. She has hot flashes but does not want to take systemic estrogen. She does use vaginal estrogen for atrophic vaginitis with good results. She is having no pelvic pain. She is having no vaginal bleeding. She is up-to-date on mammograms. She's had 2 normal bone densities. She does her lab through PCP.  HEENT: Within normal limits. Caryn Bee present. Neck: No masses. Supraclavicular lymph nodes: Not enlarged. Breasts: Examined in both sitting and lying position. Symmetrical without skin changes or masses. Abdomen: Soft no masses guarding or rebound. No hernias. Pelvic: External within normal limits. BUS within normal limits. Vaginal examination shows good estrogen effect, no cystocele enterocele or rectocele. Cervix and uterus absent. Adnexa within normal limits. Rectovaginal confirmatory. Extremities within normal limits.  Assessment: Menopausal symptoms.  Plan: Continue Estrace vaginal cream. Continue yearly mammograms.

## 2011-10-07 ENCOUNTER — Other Ambulatory Visit: Payer: Self-pay | Admitting: Nurse Practitioner

## 2012-03-14 ENCOUNTER — Telehealth: Payer: Self-pay | Admitting: Gastroenterology

## 2012-03-14 NOTE — Telephone Encounter (Signed)
Patient is due for annual follow up.  She will come in tomorrow at 9:45 and see Dr. Fuller Plan

## 2012-03-15 ENCOUNTER — Encounter: Payer: Self-pay | Admitting: Gastroenterology

## 2012-03-15 ENCOUNTER — Ambulatory Visit (INDEPENDENT_AMBULATORY_CARE_PROVIDER_SITE_OTHER): Payer: Managed Care, Other (non HMO) | Admitting: Gastroenterology

## 2012-03-15 VITALS — BP 100/60 | HR 72 | Ht 65.5 in | Wt 175.6 lb

## 2012-03-15 DIAGNOSIS — K5150 Left sided colitis without complications: Secondary | ICD-10-CM

## 2012-03-15 DIAGNOSIS — K921 Melena: Secondary | ICD-10-CM

## 2012-03-15 MED ORDER — MESALAMINE 1000 MG RE SUPP: RECTAL | Status: DC

## 2012-03-15 MED ORDER — MESALAMINE 1.2 G PO TBEC: DELAYED_RELEASE_TABLET | ORAL | Status: DC

## 2012-03-15 NOTE — Progress Notes (Signed)
History of Present Illness: This is a 56 year old female with recurrent ulcerative colitis and internal hemorrhoids. She underwent colonoscopy in April 2012. She relates the onset of red rectal bleeding with lower abdominal cramping about one week ago the abdominal cramping is relieved with a bowel movement. She states her bowel movements have been normal and rectal bleeding has gradually subsided over the past few days.  Current Medications, Allergies, Past Medical History, Past Surgical History, Family History and Social History were reviewed in [employer] record.  Physical Exam: General: Well developed , well nourished, no acute distress Head: Normocephalic and atraumatic Eyes:  sclerae anicteric, EOMI Ears: Normal auditory acuity Mouth: No deformity or lesions Lungs: Clear throughout to auscultation Heart: Regular rate and rhythm; no murmurs, rubs or bruits Abdomen: Soft, non tender and non distended. No masses, hepatosplenomegaly or hernias noted. Normal Bowel sounds Rectal: No external or internal lesions trace Hemoccult positive brown stool the vault Musculoskeletal: Symmetrical with no gross deformities  Pulses:  Normal pulses noted Extremities: No clubbing, cyanosis, edema or deformities noted Neurological: Alert oriented x 4, grossly nonfocal Psychological:  Alert and cooperative. Normal mood and affect  Assessment and Recommendations:  1. Rectal bleeding with lower abdominal cramping. Possible mild flare of ulcerative colitis, possible hemorrhoidal bleeding, possible self-limited process. Begin Canasa suppositories. Continue the elbow. Patient states she had full panel of blood work done within the past month by her primary physician and she will forward those results to [name]. She is advised to call sooner symptoms do not completely resolve within the next week. Return office visit 6 months.

## 2012-03-15 NOTE — Patient Instructions (Addendum)
We have sent the following medications to your pharmacy for you to pick up at your convenience: Canasa suppositories, Lialda.  Please have your Primary Care doctor fax your recent blood work to [facility] [place] at [phone].  cc: Nolene Ebbs, MD

## 2012-03-26 ENCOUNTER — Emergency Department (HOSPITAL_COMMUNITY): Payer: Managed Care, Other (non HMO)

## 2012-03-26 ENCOUNTER — Inpatient Hospital Stay (HOSPITAL_COMMUNITY)
Admission: EM | Admit: 2012-03-26 | Discharge: 2012-04-04 | DRG: 387 | Disposition: A | Discharge status: Home / Self Care | Payer: Managed Care, Other (non HMO) | Attending: Internal Medicine | Admitting: Internal Medicine

## 2012-03-26 ENCOUNTER — Encounter (HOSPITAL_COMMUNITY): Payer: Self-pay | Admitting: Emergency Medicine

## 2012-03-26 DIAGNOSIS — Z79899 Other long term (current) drug therapy: Secondary | ICD-10-CM

## 2012-03-26 DIAGNOSIS — R142 Eructation: Secondary | ICD-10-CM | POA: Diagnosis not present

## 2012-03-26 DIAGNOSIS — K8020 Calculus of gallbladder without cholecystitis without obstruction: Secondary | ICD-10-CM | POA: Diagnosis present

## 2012-03-26 DIAGNOSIS — N879 Dysplasia of cervix uteri, unspecified: Secondary | ICD-10-CM

## 2012-03-26 DIAGNOSIS — Z860100 Personal history of colon polyps, unspecified: Secondary | ICD-10-CM

## 2012-03-26 DIAGNOSIS — J45909 Unspecified asthma, uncomplicated: Secondary | ICD-10-CM

## 2012-03-26 DIAGNOSIS — E0590 Thyrotoxicosis, unspecified without thyrotoxic crisis or storm: Secondary | ICD-10-CM

## 2012-03-26 DIAGNOSIS — K5150 Left sided colitis without complications: Principal | ICD-10-CM

## 2012-03-26 DIAGNOSIS — D649 Anemia, unspecified: Secondary | ICD-10-CM

## 2012-03-26 DIAGNOSIS — A0472 Enterocolitis due to Clostridium difficile, not specified as recurrent: Secondary | ICD-10-CM

## 2012-03-26 DIAGNOSIS — R141 Gas pain: Secondary | ICD-10-CM

## 2012-03-26 DIAGNOSIS — D638 Anemia in other chronic diseases classified elsewhere: Secondary | ICD-10-CM | POA: Diagnosis present

## 2012-03-26 DIAGNOSIS — K648 Other hemorrhoids: Secondary | ICD-10-CM

## 2012-03-26 DIAGNOSIS — K529 Noninfective gastroenteritis and colitis, unspecified: Secondary | ICD-10-CM

## 2012-03-26 DIAGNOSIS — R143 Flatulence: Secondary | ICD-10-CM

## 2012-03-26 DIAGNOSIS — M25519 Pain in unspecified shoulder: Secondary | ICD-10-CM

## 2012-03-26 DIAGNOSIS — K219 Gastro-esophageal reflux disease without esophagitis: Secondary | ICD-10-CM

## 2012-03-26 DIAGNOSIS — K644 Residual hemorrhoidal skin tags: Secondary | ICD-10-CM | POA: Diagnosis present

## 2012-03-26 DIAGNOSIS — J4540 Moderate persistent asthma, uncomplicated: Secondary | ICD-10-CM | POA: Diagnosis present

## 2012-03-26 LAB — CBC
HCT: 33.8 % — ABNORMAL LOW (ref 36.0–46.0)
Hemoglobin: 11.4 g/dL — ABNORMAL LOW (ref 12.0–15.0)
MCH: 28.6 pg (ref 26.0–34.0)
MCHC: 33.7 g/dL (ref 30.0–36.0)
MCV: 84.7 fL (ref 78.0–100.0)
Platelets: 398 10*3/uL (ref 150–400)
RBC: 3.99 MIL/uL (ref 3.87–5.11)
RDW: 12.8 % (ref 11.5–15.5)
WBC: 8.2 10*3/uL (ref 4.0–10.5)

## 2012-03-26 LAB — BASIC METABOLIC PANEL
BUN: 11 mg/dL (ref 6–23)
CO2: 30 mEq/L (ref 19–32)
Calcium: 9 mg/dL (ref 8.4–10.5)
Chloride: 104 mEq/L (ref 96–112)
Creatinine, Ser: 0.7 mg/dL (ref 0.50–1.10)
GFR calc Af Amer: >90 mL/min (ref >90)
GFR calc non Af Amer: >90 mL/min (ref >90)
Glucose, Bld: 90 mg/dL (ref 70–99)
Potassium: 3.6 mEq/L (ref 3.5–5.1)
Sodium: 142 mEq/L (ref 135–145)

## 2012-03-26 LAB — RETICULOCYTES
RBC.: 3.99 MIL/uL (ref 3.87–5.11)
Retic Count, Absolute: 43.9 10*3/uL (ref 19.0–186.0)
Retic Ct Pct: 1.1 % (ref 0.4–3.1)

## 2012-03-26 LAB — HEPATIC FUNCTION PANEL
ALT: 35 U/L (ref 0–35)
AST: 16 U/L (ref 0–37)
Albumin: 2.7 g/dL — ABNORMAL LOW (ref 3.5–5.2)
Alkaline Phosphatase: 92 U/L (ref 39–117)
Bilirubin, Direct: 0.1 mg/dL (ref 0.0–0.3)
Indirect Bilirubin: 0.2 mg/dL — ABNORMAL LOW (ref 0.3–0.9)
Total Bilirubin: 0.3 mg/dL (ref 0.3–1.2)
Total Protein: 6.6 g/dL (ref 6.0–8.3)

## 2012-03-26 LAB — LIPASE, BLOOD: Lipase: 22 U/L (ref 11–59)

## 2012-03-26 LAB — CLOSTRIDIUM DIFFICILE BY PCR: Toxigenic C. Difficile by PCR: NEGATIVE

## 2012-03-26 LAB — OCCULT BLOOD, POC DEVICE: Fecal Occult Bld: POSITIVE

## 2012-03-26 MED ORDER — PANTOPRAZOLE SODIUM 40 MG IV SOLR
40.0000 mg | Freq: Two times a day (BID) | INTRAVENOUS | Status: DC
  Administered 2012-03-26: 40 mg via INTRAVENOUS
  Filled 2012-03-26 (×4): qty 40

## 2012-03-26 MED ORDER — IOHEXOL 300 MG/ML  SOLN
100.0000 mL | Freq: Once | INTRAMUSCULAR | Status: AC | PRN
  Administered 2012-03-26: 100 mL via INTRAVENOUS

## 2012-03-26 MED ORDER — SODIUM CHLORIDE 0.9 % IV SOLN
INTRAVENOUS | Status: DC
  Administered 2012-03-26 – 2012-03-31 (×6): via INTRAVENOUS

## 2012-03-26 MED ORDER — ONDANSETRON HCL 4 MG/2ML IJ SOLN: 4.0000 mg | Freq: Four times a day (QID) | INTRAMUSCULAR | Status: DC | PRN

## 2012-03-26 MED ORDER — MESALAMINE 1.2 G PO TBEC
1200.0000 mg | DELAYED_RELEASE_TABLET | Freq: Every day | ORAL | Status: DC
  Administered 2012-03-27 – 2012-04-01 (×5): 1.2 g via ORAL
  Filled 2012-03-26 (×7): qty 1

## 2012-03-26 MED ORDER — IOHEXOL 300 MG/ML  SOLN
20.0000 mL | INTRAMUSCULAR | Status: AC
  Administered 2012-03-26: 20 mL via ORAL

## 2012-03-26 MED ORDER — ONDANSETRON HCL 4 MG/2ML IJ SOLN
4.0000 mg | Freq: Once | INTRAMUSCULAR | Status: AC
  Administered 2012-03-26: 4 mg via INTRAVENOUS
  Filled 2012-03-26: qty 2

## 2012-03-26 MED ORDER — METHYLPREDNISOLONE SODIUM SUCC 40 MG IJ SOLR
40.0000 mg | Freq: Three times a day (TID) | INTRAMUSCULAR | Status: DC
  Administered 2012-03-26 – 2012-03-27 (×3): 40 mg via INTRAVENOUS
  Filled 2012-03-26 (×6): qty 1

## 2012-03-26 MED ORDER — MORPHINE SULFATE 4 MG/ML IJ SOLN
4.0000 mg | Freq: Once | INTRAMUSCULAR | Status: AC
  Administered 2012-03-26: 4 mg via INTRAVENOUS
  Filled 2012-03-26: qty 1

## 2012-03-26 MED ORDER — SODIUM CHLORIDE 0.9 % IV SOLN
INTRAVENOUS | Status: DC
  Administered 2012-03-31: 15:00:00 via INTRAVENOUS

## 2012-03-26 MED ORDER — CEFTRIAXONE SODIUM 1 G IJ SOLR
1.0000 g | INTRAMUSCULAR | Status: DC
  Administered 2012-03-26: 1 g via INTRAVENOUS
  Filled 2012-03-26 (×2): qty 10

## 2012-03-26 MED ORDER — ONDANSETRON HCL 4 MG PO TABS
4.0000 mg | ORAL_TABLET | Freq: Four times a day (QID) | ORAL | Status: DC | PRN
  Administered 2012-03-27: 4 mg via ORAL
  Filled 2012-03-26: qty 1

## 2012-03-26 MED ORDER — MONTELUKAST SODIUM 10 MG PO TABS
10.0000 mg | ORAL_TABLET | Freq: Every day | ORAL | Status: DC
  Administered 2012-03-26 – 2012-04-03 (×9): 10 mg via ORAL
  Filled 2012-03-26 (×10): qty 1

## 2012-03-26 MED ORDER — SACCHAROMYCES BOULARDII 250 MG PO CAPS
250.0000 mg | ORAL_CAPSULE | Freq: Two times a day (BID) | ORAL | Status: DC
  Administered 2012-03-26 – 2012-03-29 (×6): 250 mg via ORAL
  Filled 2012-03-26 (×7): qty 1

## 2012-03-26 MED ORDER — HYDROCODONE-ACETAMINOPHEN 5-325 MG PO TABS
1.0000 | ORAL_TABLET | Freq: Four times a day (QID) | ORAL | Status: DC | PRN
  Administered 2012-03-26 – 2012-03-27 (×3): 1 via ORAL
  Filled 2012-03-26 (×3): qty 1

## 2012-03-26 MED ORDER — CIPROFLOXACIN IN D5W 400 MG/200ML IV SOLN: 400.0000 mg | Freq: Once | INTRAVENOUS | Status: DC

## 2012-03-26 MED ORDER — METRONIDAZOLE IN NACL 5-0.79 MG/ML-% IV SOLN
500.0000 mg | Freq: Once | INTRAVENOUS | Status: AC
  Administered 2012-03-26: 500 mg via INTRAVENOUS
  Filled 2012-03-26: qty 100

## 2012-03-26 MED ORDER — BUDESONIDE-FORMOTEROL FUMARATE 160-4.5 MCG/ACT IN AERO
2.0000 | INHALATION_SPRAY | Freq: Two times a day (BID) | RESPIRATORY_TRACT | Status: DC
  Administered 2012-03-26 – 2012-04-04 (×17): 2 via RESPIRATORY_TRACT
  Filled 2012-03-26: qty 6

## 2012-03-26 MED ORDER — ALBUTEROL SULFATE HFA 108 (90 BASE) MCG/ACT IN AERS
2.0000 | INHALATION_SPRAY | RESPIRATORY_TRACT | Status: DC | PRN
  Filled 2012-03-26: qty 6.7

## 2012-03-26 MED ORDER — PIPERACILLIN-TAZOBACTAM 3.375 G IVPB
3.3750 g | Freq: Three times a day (TID) | INTRAVENOUS | Status: DC
  Administered 2012-03-26 – 2012-03-27 (×2): 3.375 g via INTRAVENOUS
  Filled 2012-03-26 (×4): qty 50

## 2012-03-26 MED ORDER — MORPHINE SULFATE 2 MG/ML IJ SOLN
1.0000 mg | INTRAMUSCULAR | Status: DC | PRN
  Administered 2012-03-27 – 2012-03-29 (×5): 1 mg via INTRAVENOUS
  Filled 2012-03-26 (×5): qty 1

## 2012-03-26 NOTE — ED Notes (Signed)
Pt c/o abdominal pain, n/v, loose stool and rectal bleeding ongoing since 03/03/2012. Pt has been seen by PMD for same and given different types of medications without relief. Pt last saw Dr. Jeanie Cooks on Oct 21.

## 2012-03-26 NOTE — ED Provider Notes (Signed)
History     CSN: [id number]  Arrival date & time 03/26/12  8676   First MD Initiated Contact with Patient 03/26/12 [phone]      Chief Complaint  Patient presents with  . Rectal Bleeding  . Abdominal Pain  . Nausea    (Consider location/radiation/quality/duration/timing/severity/associated sxs/prior treatment) HPI Complains of severe crampy diffuse abdominal pain onset one month ago accompanied by blood per rectum also occurring for the past month. Symptoms typical of ulcerative colitis which has been diagnosed within the past Brianna Santana's been treated with penicillin and hydrocodone-acetaminophen, without relief. No fever associated symptoms include nausea no vomiting. Nothing makes symptoms better or worse. Pain is constant and unchanged over the past month. Brianna Santana presents today because "I could no longer take the pain" Past Medical History  Diagnosis Date  . Internal hemorrhoids   . Ulcerative colitis, left sided 1998  . Asthma   . Anemia   . C. difficile colitis 2011 and 07/2010  . GERD (gastroesophageal reflux disease)   . Hyperplastic polyp of intestine 09/2007  . Hyperthyroidism     S/P I-131 RX  . Asthma   . Cervical dysplasia     Past Surgical History  Procedure Date  . Tonsillectomy   . Colonoscopy   . Breast lumpectomy 1980's, 2000    Right Breast x 2  . Total abdominal hysterectomy 2000  . Colposcopy     Family History  Problem Relation Age of Onset  . Diabetes Father   . Diabetes Sister     x 2  . Hypertension Sister     x 2  . Diabetes Brother   . Hypertension Brother   . Colon cancer Neg Hx     History  Substance Use Topics  . Smoking status: Former Smoker -- 10 years    Types: Cigarettes    Quit date: 09/07/1988  . Smokeless tobacco: Never Used  . Alcohol Use: No    OB History    Grav Para Term Preterm Abortions TAB SAB Ect Mult Living   3 3 3       3       Review of Systems  Constitutional: Negative.   HENT: Negative.   Respiratory:  Negative.   Cardiovascular: Negative.   Gastrointestinal: Positive for nausea, abdominal pain and blood in stool.  Musculoskeletal: Negative.   Skin: Negative.   Neurological: Negative.   Hematological: Negative.   Psychiatric/Behavioral: Negative.     Allergies  Levofloxacin and Prednisone  Home Medications   Current Outpatient Rx  Name Route Sig Dispense Refill  . ALBUTEROL SULFATE HFA 108 (90 BASE) MCG/ACT IN AERS Inhalation Inhale 2 puffs into the lungs every 6 (six) hours as needed.      . BUDESONIDE-FORMOTEROL FUMARATE 160-4.5 MCG/ACT IN AERO Inhalation Inhale 2 puffs into the lungs 2 (two) times daily. 1 Inhaler 12  . CALCIUM CARBONATE-VITAMIN D 600-400 MG-UNIT PO TABS Oral Take 1 tablet by mouth 2 (two) times daily.      Marland Kitchen CETIRIZINE HCL 10 MG PO TABS Oral Take 10 mg by mouth daily.    Marland Kitchen CLOBETASOL PROPIONATE 0.05 % EX CREA Topical Apply 1 application topically as needed.    Marland Kitchen ESTRADIOL 0.1 MG/GM VA CREA Vaginal Place 2 g vaginally daily.    Marland Kitchen HYDROCODONE-ACETAMINOPHEN 5-325 MG PO TABS Oral Take 1 tablet by mouth every 6 (six) hours as needed.      Marland Kitchen MESALAMINE 1000 MG RE SUPP  Use rectally once daily at bedtime x 1  week then use as needed 30 suppository 2  . MESALAMINE 1.2 G PO TBEC  TAKE 2 TABLETS BY MOUTH TWICE A DAY 120 tablet 5  . MONTELUKAST SODIUM 10 MG PO TABS Oral Take 10 mg by mouth at bedtime.      Marland Kitchen ONE-DAILY MULTI VITAMINS PO TABS Oral Take 1 tablet by mouth daily.      Marland Kitchen OMEPRAZOLE 40 MG PO CPDR Oral Take 1 capsule (40 mg total) by mouth daily as needed. 30 capsule 11    BP 134/70  Pulse 86  Temp 99.3 F (37.4 C) (Oral)  Resp 18  SpO2 95%  Physical Exam  Nursing note and vitals reviewed. Constitutional: Brianna Santana appears well-developed and well-nourished. Brianna Santana appears distressed.       Appears uncomfortable  HENT:  Head: Normocephalic and atraumatic.  Eyes: Conjunctivae normal are normal. Pupils are equal, round, and reactive to light.  Neck: Neck supple.  No tracheal deviation present. No thyromegaly present.  Cardiovascular: Normal rate and regular rhythm.   No murmur heard. Pulmonary/Chest: Effort normal and breath sounds normal.  Abdominal: Soft. Bowel sounds are normal. Brianna Santana exhibits no distension and no mass. There is tenderness. There is no rebound and no guarding.       Diffuse tenderness  Genitourinary: Guaiac positive stool.       Brown stool trace Hemoccult positive, nontender on rectal exam  Musculoskeletal: Normal range of motion. Brianna Santana exhibits no edema and no tenderness.  Neurological: Brianna Santana is alert. Coordination normal.  Skin: Skin is warm and dry. No rash noted.  Psychiatric: Brianna Santana has a normal mood and affect.    ED Course  Procedures (including critical care time)   Labs Reviewed  OCCULT BLOOD, POC DEVICE  CBC  BASIC METABOLIC PANEL   No results found.   No diagnosis found. Results for orders placed during the hospital encounter of 03/26/12  OCCULT BLOOD, POC DEVICE      Component Value Range   Fecal Occult Bld POSITIVE    CBC      Component Value Range   WBC 8.2  4.0 - 10.5 K/uL   RBC 3.99  3.87 - 5.11 MIL/uL   Hemoglobin 11.4 (*) 12.0 - 15.0 g/dL   HCT 33.8 (*) 36.0 - 46.0 %   MCV 84.7  78.0 - 100.0 fL   MCH 28.6  26.0 - 34.0 pg   MCHC 33.7  30.0 - 36.0 g/dL   RDW 12.8  11.5 - 15.5 %   Platelets 398  150 - 400 K/uL  BASIC METABOLIC PANEL      Component Value Range   Sodium 142  135 - 145 mEq/L   Potassium 3.6  3.5 - 5.1 mEq/L   Chloride 104  96 - 112 mEq/L   CO2 30  19 - 32 mEq/L   Glucose, Bld 90  70 - 99 mg/dL   BUN 11  6 - 23 mg/dL   Creatinine, Ser 0.70  0.50 - 1.10 mg/dL   Calcium 9.0  8.4 - 10.5 mg/dL   GFR calc non Af Amer >90  >90 mL/min   GFR calc Af Amer >90  >90 mL/min   Ct Abdomen Pelvis W Contrast  03/26/2012  *RADIOLOGY REPORT*  Clinical Data: 56 year old female with abdominal and pelvic pain and rectal bleeding.  CT ABDOMEN AND PELVIS WITH CONTRAST  Technique:  Multidetector CT  imaging of the abdomen and pelvis was performed following the standard protocol during bolus administration of intravenous contrast.  Contrast:  135m OMNIPAQUE IOHEXOL 300 MG/ML  SOLN  Comparison: 02/07/2010 CT  Findings: Circumferential wall thickening of the descending colon with mild adjacent inflammation is noted and compatible with colitis.  Mildly enlarged left mesenteric lymph nodes are again noted.  The visualized portions of the celiac artery, SMA, IMA and SMV are patent.  There is no evidence of bowel obstruction or pneumoperitoneum.  The liver, spleen, pancreas, adrenal glands and kidneys are unremarkable. Cholelithiasis identified without CT evidence of cholecystitis. The appendix and bladder are unremarkable.  No free fluid, biliary dilation or abdominal aortic aneurysm identified.  No acute or suspicious bony abnormalities are noted.  IMPRESSION: Colitis of the descending colon - question inflammatory or infectious.  Although this could represent ischemic colitis given distribution, the visualized mesenteric vessels are patent.  Cholelithiasis without CT evidence of acute cholecystitis.   Original Report Authenticated By: JLura Em M.D.    150 pm feels improved after treatment with IV fluids IV Avelox and antiemetics  Spoke with hospitalist who will evaluate patient for admission for 23 hour observation MDM  Plan pain control IV antibiotics Diagnosis colitis        SOrlie Dakin MD 03/26/12 [phone]

## 2012-03-26 NOTE — Progress Notes (Signed)
Pharmacy Consult - Zosyn  Starting empiric antibiotics for colitis Renal function normal  Plan: 1) Zosyn 3.375 grams iv Q 8 hours - 4 hr infusion 2) Pharmacy will sign off.  Thank you. Anette Guarneri, PharmD [phone]

## 2012-03-26 NOTE — Consult Note (Signed)
Cross cover [facility] Reason for Consult: Severe diarrhea and rectal bleeding. Referring Physician: [facility]-Dr. Sedonia Small is an 56 y.o. female.  HPI: Patient gives a history of ulcerative colitis being diagnosed about 20 years ago, by Dr. Everlean Cherry, followed over the last few years by Dr. Fuller Plan. She last saw Dr. Fuller Plan on 03/15/12 when he felt she was having a mild flare and prescribed Canasa suppositories. The patient's symptoms worsened prompting her to go to her PCP who put her on Prednisone and Hydrocodone for pain. She however did not improve with these medications and as her symptoms gradually worsened she came to the ER today. She complains of epigastric pain and lower abdominal pain with cramping, She last had a colonoscopy done on 09/15/10 when multiple biopsies were done from the left, transverse and right colon but only the biopsies from the left colon revealed mild active colitis; the rest of the biopsies revealed normal colonic mucosa. There was no evidence of dysplasia. She denies any skin rashes, joint pains or ocular symptoms. She has been very nauseated but has not vomited. She has occasional reflux with spicy, greasy foods. There are no associated fever, chills or rigors. She has had C. Difficile colitis in 2011 and again in 2012.   Past Medical History  Diagnosis Date  . Internal hemorrhoids   . Ulcerative colitis, left sided 1998  . Asthma   . Anemia   . C. difficile colitis 2011 and 07/2010  . GERD (gastroesophageal reflux disease)   . Hyperplastic polyp of intestine 09/2007  . Hyperthyroidism     S/P I-131 RX  . Asthma   . Cervical dysplasia    Past Surgical History  Procedure Date  . Tonsillectomy   . Colonoscopy   . Breast lumpectomy 1980's, 2000    Right Breast x 2  . Total abdominal hysterectomy 2000  . Colposcopy    Family History  Problem Relation Age of Onset  . Diabetes Father   . Diabetes Sister     x 2  . Hypertension Sister     x 2  .  Diabetes Brother   . Hypertension Brother   . Colon cancer Neg Hx    Social History:  reports that she quit smoking about 23 years ago. Her smoking use included Cigarettes. She quit after 10 years of use. She has never used smokeless tobacco. She reports that she does not drink alcohol or use illicit drugs.  Allergies:  Allergies  Allergen Reactions  . Levofloxacin Other (See Comments)    Doesn't remember reaction  . Prednisone Swelling        Medications: I have reviewed the patient's current medications.  Results for orders placed during the hospital encounter of 03/26/12 (from the past 48 hour(s))  OCCULT BLOOD, POC DEVICE     Status: Normal   Collection Time   03/26/12  9:22 AM      Component Value Range Comment   Fecal Occult Bld POSITIVE     CBC     Status: Abnormal   Collection Time   03/26/12  9:48 AM      Component Value Range Comment   WBC 8.2  4.0 - 10.5 K/uL    RBC 3.99  3.87 - 5.11 MIL/uL    Hemoglobin 11.4 (*) 12.0 - 15.0 g/dL    HCT 33.8 (*) 36.0 - 46.0 %    MCV 84.7  78.0 - 100.0 fL    MCH 28.6  26.0 - 34.0  pg    MCHC 33.7  30.0 - 36.0 g/dL    RDW 12.8  11.5 - 15.5 %    Platelets 398  150 - 400 K/uL   BASIC METABOLIC PANEL     Status: Normal   Collection Time   03/26/12  9:48 AM      Component Value Range Comment   Sodium 142  135 - 145 mEq/L    Potassium 3.6  3.5 - 5.1 mEq/L    Chloride 104  96 - 112 mEq/L    CO2 30  19 - 32 mEq/L    Glucose, Bld 90  70 - 99 mg/dL    BUN 11  6 - 23 mg/dL    Creatinine, Ser 0.70  0.50 - 1.10 mg/dL    Calcium 9.0  8.4 - 10.5 mg/dL    GFR calc non Af Amer >90  >90 mL/min    GFR calc Af Amer >90  >90 mL/min   CLOSTRIDIUM DIFFICILE BY PCR     Status: Normal   Collection Time   03/26/12  5:02 PM      Component Value Range Comment   C difficile by pcr NEGATIVE  NEGATIVE     Ct Abdomen Pelvis W Contrast  03/26/2012  *RADIOLOGY REPORT*  Clinical Data: 56 year old female with abdominal and pelvic pain and rectal  bleeding.  CT ABDOMEN AND PELVIS WITH CONTRAST  Technique:  Multidetector CT imaging of the abdomen and pelvis was performed following the standard protocol during bolus administration of intravenous contrast.  Contrast: 130m OMNIPAQUE IOHEXOL 300 MG/ML  SOLN  Comparison: 02/07/2010 CT  Findings: Circumferential wall thickening of the descending colon with mild adjacent inflammation is noted and compatible with colitis.  Mildly enlarged left mesenteric lymph nodes are again noted.  The visualized portions of the celiac artery, SMA, IMA and SMV are patent.  There is no evidence of bowel obstruction or pneumoperitoneum.  The liver, spleen, pancreas, adrenal glands and kidneys are unremarkable. Cholelithiasis identified without CT evidence of cholecystitis. The appendix and bladder are unremarkable.  No free fluid, biliary dilation or abdominal aortic aneurysm identified.  No acute or suspicious bony abnormalities are noted.  IMPRESSION: Colitis of the descending colon - question inflammatory or infectious.  Although this could represent ischemic colitis given distribution, the visualized mesenteric vessels are patent.  Cholelithiasis without CT evidence of acute cholecystitis.   Original Report Authenticated By: JLura Em M.D.     Review of Systems  Constitutional: Positive for malaise/fatigue. Negative for fever, chills, weight loss and diaphoresis.  HENT: Negative.   Eyes: Negative.   Cardiovascular: Negative.   Gastrointestinal: Positive for nausea, abdominal pain, diarrhea and blood in stool.  Genitourinary: Negative.   Musculoskeletal: Positive for myalgias. Negative for joint pain.  Neurological: Positive for weakness. Negative for dizziness, tingling, tremors, sensory change and speech change.  Endo/Heme/Allergies: Negative.   Psychiatric/Behavioral: Negative for depression, suicidal ideas and substance abuse.   Blood pressure 128/66, pulse 81, temperature 101.7 F (38.7 C), temperature  source Oral, resp. rate 16, SpO2 98.00%. Physical Exam  Constitutional: She is oriented to person, place, and time. She appears well-developed and well-nourished.  HENT:  Head: Normocephalic and atraumatic.  Eyes: Conjunctivae normal and EOM are normal. Pupils are equal, round, and reactive to light.  Neck: Normal range of motion. Neck supple.  Cardiovascular: Normal rate and regular rhythm.   Respiratory: Effort normal and breath sounds normal.  GI: Soft. She exhibits no distension and no mass. There  is tenderness. There is guarding. There is no rebound.       Epigastric and periumbilical tenderness on palpation with gaurding but without rebound or rigidity  Musculoskeletal: Normal range of motion.  Neurological: She is alert and oriented to person, place, and time. She has normal reflexes.  Skin: Skin is warm and dry.  Psychiatric: She has a normal mood and affect. Her behavior is normal. Thought content normal.    Assessment/Plan: 1) Abdominal pain [epigastric] with nausea-cholelithiasis on abdominal CT. Will check a hepatic function panel and a Lipase tonight.  2) UC/Left sided colitis on CT; on Mesalamine and Canasa along with Prednisone for UC. CDAD being ruled out.  3) Anemia: secondary to 2. Check an anemia panel. 4) Internal hemorrhoids.  5) GERD-will add a PPI. Marland Kitchen 6) Cholelithiasis on CT.  7) Gas and bloating: add Florastor BID PO.  8) Asthma on Proventil and Symbicort.   Dorean Daniello 03/26/2012, 7:25 PM

## 2012-03-26 NOTE — H&P (Signed)
[facility] History and Physical  Brianna Santana ZOX:[id number] DOB: 1955/08/13 DOA: 03/26/2012  Referring physician: Dr Winfred Leeds PCP: [name], MD  Specialists: DrSTartk  Chief Complaint: abdominal cramps and rectal bleeding with bm's since 3 weeks.   HPI: Brianna Santana is a 56 y.o. female with  Prior h/o UC for more than 20 years, came in for worsening abdominal cramps, and rectal bleeding with every BM, over the last one week.  Symptoms started 3 weeks ago, she was Dr Fuller Plan on October 16th and was given mesalamine suppositories, but despite the change in medications, she continued to have abdominal pain and rectal bleeding . She denies fever or chills or vomiting. She reports nausea. On arrival to ED, she was further worked up with a CT ABD showed colitis of the descending colon. She is being admitted to our service for management of colitis and possible UC flare.   Review of Systems: The patient denies anorexia, fever, weight loss,, vision loss, decreased hearing, hoarseness, chest pain, syncope, dyspnea on exertion, peripheral edema, balance deficits, hemoptysis, abdominal pain, melena,  severe indigestion/heartburn, hematuria, incontinence, genital sores, muscle weakness, suspicious skin lesions, transient blindness, difficulty walking, depression, unusual weight change, abnormal bleeding, enlarged lymph nodes, angioedema, and breast masses.    Past Medical History  Diagnosis Date  . Internal hemorrhoids   . Ulcerative colitis, left sided 1998  . Asthma   . Anemia   . C. difficile colitis 2011 and 07/2010  . GERD (gastroesophageal reflux disease)   . Hyperplastic polyp of intestine 09/2007  . Hyperthyroidism     S/P I-131 RX  . Asthma   . Cervical dysplasia    Past Surgical History  Procedure Date  . Tonsillectomy   . Colonoscopy   . Breast lumpectomy 1980's, 2000    Right Breast x 2  . Total abdominal hysterectomy 2000  . Colposcopy    Social History:   reports that she quit smoking about 23 years ago. Her smoking use included Cigarettes. She quit after 10 years of use. She has never used smokeless tobacco. She reports that she does not drink alcohol or use illicit drugs.  where does patient live--home, with husband Can patient participate in ADLs? yes  Allergies  Allergen Reactions  . Levofloxacin Other (See Comments)    Doesn't remember reaction  . Prednisone Swelling         Family History  Problem Relation Age of Onset  . Diabetes Father   . Diabetes Sister     x 2  . Hypertension Sister     x 2  . Diabetes Brother   . Hypertension Brother   . Colon cancer Neg Hx     Prior to Admission medications   Medication Sig Start Date End Date Taking? Authorizing Provider  albuterol (PROAIR HFA) 108 (90 BASE) MCG/ACT inhaler Inhale 2 puffs into the lungs every 4 (four) hours as needed. For shortness of breath or wheezing   Yes Historical Provider, MD  budesonide-formoterol (SYMBICORT) 160-4.5 MCG/ACT inhaler Inhale 2 puffs into the lungs 2 (two) times daily. 06/10/11 06/09/12 Yes Harden Mo, MD  Calcium Carbonate-Vitamin D (CALCIUM 600+D) 600-400 MG-UNIT per tablet Take 1 tablet by mouth 2 (two) times daily.     Yes Historical Provider, MD  cetirizine (ZYRTEC) 10 MG tablet Take 10 mg by mouth daily.   Yes Historical Provider, MD  Cholecalciferol (VITAMIN D) 2000 UNITS tablet Take 2,000 Units by mouth daily.   Yes Historical Provider, MD  clobetasol cream (TEMOVATE) 4.58 % Apply 1 application topically as needed. For rash   Yes Historical Provider, MD  HYDROcodone-acetaminophen (NORCO) 5-325 MG per tablet Take 1 tablet by mouth every 6 (six) hours as needed. For pain   Yes Historical Provider, MD  meloxicam (MOBIC) 15 MG tablet Take 15 mg by mouth daily.   Yes Historical Provider, MD  mesalamine (CANASA) 1000 MG suppository Place 1,000 mg rectally at bedtime as needed.   Yes Historical Provider, MD  mesalamine (LIALDA) 1.2 G EC tablet  Take 1,200 mg by mouth daily with breakfast.   Yes Historical Provider, MD  montelukast (SINGULAIR) 10 MG tablet Take 10 mg by mouth at bedtime.    Yes Historical Provider, MD  Multiple Vitamin (MULTIVITAMIN WITH MINERALS) TABS Take 1 tablet by mouth daily.   Yes Historical Provider, MD  omeprazole (PRILOSEC) 40 MG capsule Take 40 mg by mouth daily as needed. For heartburn 05/24/11  Yes Ladene Artist, MD,FACG   Physical Exam: Filed Vitals:   03/26/12 0856  BP: 134/70  Pulse: 86  Temp: 99.3 F (37.4 C)  TempSrc: Oral  Resp: 18  SpO2: 95%    Constitutional: Vital signs reviewed.  Patient is a well-developed and well-nourished in mild distress and cooperative with exam. Alert and oriented x3.  Head: Normocephalic and atraumatic Mouth: no erythema or exudates, dry MM Eyes: PERRL, EOMI, conjunctivae normal, No scleral icterus.  Neck: Supple, Trachea midline normal ROM, No JVD, mass, thyromegaly, or carotid bruit present.  Cardiovascular: RRR, S1 normal, S2 normal, no MRG, pulses symmetric and intact bilaterally Pulmonary/Chest: CTAB, no wheezes, rales, or rhonchi Abdominal: Soft. Non-tender, non-distended, bowel sounds are normal, no masses, organomegaly, or guarding present.  Musculoskeletal: No joint deformities, erythema, or stiffness, ROM full and no nontender  Neurological: A&O x3, no focal deficits.   Skin: Warm, dry and intact. No rash, cyanosis, or clubbing.  Psychiatric: anxious, speech and behavior is normal.   Labs on Admission:  Basic Metabolic Panel:  Lab 09/98/33 0948  NA 142  K 3.6  CL 104  CO2 30  GLUCOSE 90  BUN 11  CREATININE 0.70  CALCIUM 9.0  MG --  PHOS --   Liver Function Tests: No results found for this basename: AST:5,ALT:5,ALKPHOS:5,BILITOT:5,PROT:5,ALBUMIN:5 in the last 168 hours No results found for this basename: LIPASE:5,AMYLASE:5 in the last 168 hours No results found for this basename: AMMONIA:5 in the last 168 hours CBC:  Lab 03/26/12  0948  WBC 8.2  NEUTROABS --  HGB 11.4*  HCT 33.8*  MCV 84.7  PLT 398   Cardiac Enzymes: No results found for this basename: CKTOTAL:5,CKMB:5,CKMBINDEX:5,TROPONINI:5 in the last 168 hours  BNP (last 3 results) No results found for this basename: PROBNP:3 in the last 8760 hours CBG: No results found for this basename: GLUCAP:5 in the last 168 hours  Radiological Exams on Admission: Ct Abdomen Pelvis W Contrast  03/26/2012  *RADIOLOGY REPORT*  Clinical Data: 56 year old female with abdominal and pelvic pain and rectal bleeding.  CT ABDOMEN AND PELVIS WITH CONTRAST  Technique:  Multidetector CT imaging of the abdomen and pelvis was performed following the standard protocol during bolus administration of intravenous contrast.  Contrast: 131m OMNIPAQUE IOHEXOL 300 MG/ML  SOLN  Comparison: 02/07/2010 CT  Findings: Circumferential wall thickening of the descending colon with mild adjacent inflammation is noted and compatible with colitis.  Mildly enlarged left mesenteric lymph nodes are again noted.  The visualized portions of the celiac artery, SMA, IMA and SMV are  patent.  There is no evidence of bowel obstruction or pneumoperitoneum.  The liver, spleen, pancreas, adrenal glands and kidneys are unremarkable. Cholelithiasis identified without CT evidence of cholecystitis. The appendix and bladder are unremarkable.  No free fluid, biliary dilation or abdominal aortic aneurysm identified.  No acute or suspicious bony abnormalities are noted.  IMPRESSION: Colitis of the descending colon - question inflammatory or infectious.  Although this could represent ischemic colitis given distribution, the visualized mesenteric vessels are patent.  Cholelithiasis without CT evidence of acute cholecystitis.   Original Report Authenticated By: Lura Em, M.D.       Assessment/Plan Active Problems:    1. Descending colitis:  - admit for IV antibiotics. - IV zosyn as per pharmacy - c diff for pcr.  -  clear liq diet.  - GI consult called from [place].  2. UC flare: - bloody bowel movements  - not responding to mesalamine - will start her on iv solumedrol - GI called - clear liquid diet - H&H Q12hr - currently hemoglobin is 11.4 at baseline.    3. DVT prophylaxis SCD'S  RESUME rest of her home medications.   Code Status: full code Family Communication: husband at bedside Disposition Plan: home in 2 days if medically stable  Time spent: 55 min  [facility] Pager [phone]  If 7PM-7AM, please contact night-coverage [email or web address] Password [email or web address] 03/26/2012, 2:57 PM

## 2012-03-27 DIAGNOSIS — K5150 Left sided colitis without complications: Principal | ICD-10-CM

## 2012-03-27 LAB — URINALYSIS, ROUTINE W REFLEX MICROSCOPIC
Bilirubin Urine: NEGATIVE
Glucose, UA: NEGATIVE mg/dL
Ketones, ur: NEGATIVE mg/dL
Nitrite: NEGATIVE
Protein, ur: NEGATIVE mg/dL
Specific Gravity, Urine: 1.022 (ref 1.005–1.030)
Urobilinogen, UA: 0.2 mg/dL (ref 0.0–1.0)
pH: 6.5 (ref 5.0–8.0)

## 2012-03-27 LAB — BASIC METABOLIC PANEL
BUN: 11 mg/dL (ref 6–23)
CO2: 29 mEq/L (ref 19–32)
Calcium: 9.3 mg/dL (ref 8.4–10.5)
Chloride: 105 mEq/L (ref 96–112)
Creatinine, Ser: 0.69 mg/dL (ref 0.50–1.10)
GFR calc Af Amer: >90 mL/min (ref >90)
GFR calc non Af Amer: >90 mL/min (ref >90)
Glucose, Bld: 124 mg/dL — ABNORMAL HIGH (ref 70–99)
Potassium: 3.9 mEq/L (ref 3.5–5.1)
Sodium: 142 mEq/L (ref 135–145)

## 2012-03-27 LAB — CBC
HCT: 35.1 % — ABNORMAL LOW (ref 36.0–46.0)
Hemoglobin: 11.9 g/dL — ABNORMAL LOW (ref 12.0–15.0)
MCH: 29 pg (ref 26.0–34.0)
MCHC: 33.9 g/dL (ref 30.0–36.0)
MCV: 85.6 fL (ref 78.0–100.0)
Platelets: 400 10*3/uL (ref 150–400)
RBC: 4.1 MIL/uL (ref 3.87–5.11)
RDW: 13 % (ref 11.5–15.5)
WBC: 5.3 10*3/uL (ref 4.0–10.5)

## 2012-03-27 LAB — FOLATE: Folate: 19.1 ng/mL

## 2012-03-27 LAB — IRON AND TIBC
Iron: 11 ug/dL — ABNORMAL LOW (ref 42–135)
Saturation Ratios: 4 % — ABNORMAL LOW (ref 20–55)
TIBC: 260 ug/dL (ref 250–470)
UIBC: 249 ug/dL (ref 125–400)

## 2012-03-27 LAB — URINE MICROSCOPIC-ADD ON

## 2012-03-27 LAB — FERRITIN: Ferritin: 64 ng/mL (ref 10–291)

## 2012-03-27 LAB — VITAMIN B12: Vitamin B-12: 580 pg/mL (ref 211–911)

## 2012-03-27 MED ORDER — HYDROCODONE-ACETAMINOPHEN 5-325 MG PO TABS
1.0000 | ORAL_TABLET | Freq: Four times a day (QID) | ORAL | Status: DC | PRN
  Administered 2012-03-28 – 2012-04-04 (×23): 2 via ORAL
  Filled 2012-03-27 (×23): qty 2

## 2012-03-27 MED ORDER — METHYLPREDNISOLONE SODIUM SUCC 125 MG IJ SOLR
60.0000 mg | Freq: Every day | INTRAMUSCULAR | Status: DC
  Administered 2012-03-28 – 2012-04-01 (×5): 60 mg via INTRAVENOUS
  Filled 2012-03-27 (×5): qty 0.96

## 2012-03-27 NOTE — Progress Notes (Signed)
UR COMPLETED  

## 2012-03-27 NOTE — Progress Notes (Signed)
[facility] Daily Rounding Note 03/27/2012, 9:23 AM  SUBJECTIVE:       Abdominal pain, nausea is better.  This AM stools x 3 were brown, did see blood when she wiped.  Overall feels better  OBJECTIVE:         Vital signs in last 24 hours:    Temp:  [97.7 F (36.5 C)-101.7 F (38.7 C)] 97.9 F (36.6 C) (10/28 0712) Pulse Rate:  [75-90] 83  (10/28 0712) Resp:  [16] 16  (10/28 0712) BP: (107-128)/(66-72) 125/67 mmHg (10/28 0712) SpO2:  [93 %-98 %] 96 % (10/28 0712) Last BM Date: 03/27/12 General: looks well    Heart: RRR Chest: clear B.  Not SOB Abdomen: soft, NT, ND.   No mass or HSM.  Active BS  Rectal:  Erythematous appearing external hemorrhoids, no blood or tenderness on exam glove with external palpation. Extremities: no pedal edema.  Neuro/Psych:  Pleasant, not confused, disoriented or agitated.   Lab Results:  Basename 03/27/12 0500 03/26/12 0948  WBC 5.3 8.2  HGB 11.9* 11.4*  HCT 35.1* 33.8*  PLT 400 398   BMET  Basename 03/27/12 0500 03/26/12 0948  NA 142 142  K 3.9 3.6  CL 105 104  CO2 29 30  GLUCOSE 124* 90  BUN 11 11  CREATININE 0.69 0.70  CALCIUM 9.3 9.0   LFT  Basename 03/26/12 2031  PROT 6.6  ALBUMIN 2.7*  AST 16  ALT 35  ALKPHOS 92  BILITOT 0.3  BILIDIR 0.1  IBILI 0.2*   Studies/Results: Ct Abdomen Pelvis W Contrast 03/26/2012  *RADIOLOGY REPORT*  Clinical Data: 56 year old female with abdominal and pelvic pain and rectal bleeding.  CT ABDOMEN AND PELVIS WITH CONTRAST  Technique:  Multidetector CT imaging of the abdomen and pelvis was performed following the standard protocol during bolus administration of intravenous contrast.  Contrast: 183m OMNIPAQUE IOHEXOL 300 MG/ML  SOLN  Comparison: 02/07/2010 CT  Findings: Circumferential wall thickening of the descending colon with mild adjacent inflammation is noted and compatible with colitis.  Mildly enlarged left mesenteric lymph nodes are again noted.  The visualized portions of the celiac  artery, SMA, IMA and SMV are patent.  There is no evidence of bowel obstruction or pneumoperitoneum.  The liver, spleen, pancreas, adrenal glands and kidneys are unremarkable. Cholelithiasis identified without CT evidence of cholecystitis. The appendix and bladder are unremarkable.  No free fluid, biliary dilation or abdominal aortic aneurysm identified.  No acute or suspicious bony abnormalities are noted.  IMPRESSION: Colitis of the descending colon - question inflammatory or infectious.  Although this could represent ischemic colitis given distribution, the visualized mesenteric vessels are patent.  Cholelithiasis without CT evidence of acute cholecystitis.   Original Report Authenticated By: JLura Em M.D.     ASSESMENT: * Diarrhea, rectal bleeding, abdominal pain since first week Oct 2013.  Hx UC dating to 116's  Recent colonoscopy of 09/15/11 with bx of colon revealing internal hemorrhoids, mild active colitis in left colon, remainder of colon bx showed no colitis. Treated with Canasa rectal suppositories per Dr SFuller Planbeginning October 18th. On chronic Lialda for many years. .  Prednisone 20 mg, hydrocodone added 10/21 by Dr ABryon Lionsfor ongoing abdominal pain, some nausea and ongoing rectal bleeding. Note that she has been taking Mobic daily since early October.  Current CT scan shows left/descending colitis. She was started on Solu-medrol at 40 mg q 8 hours along with abx. .  *  Hx CDiff colitis  in 2011 and 2012.  inpt Florastor added.  *  Cholelithiasis, uncomplicated.  Incidental CT scan finding. LFTs and Liapase normal. *  2009 hyperplastic colon polyp      PLAN: *  IVF to 50 ml/hour (currently at 125 per hour). *  Solumedrol to 60 mg daily. D/C IV Protonix. Start po. Takes PPI prn at home.  *  D/C zosyn and Rocephin, started by Dr Karleen Hampshire and ED MD.  *  Allow low residue diet as tolerated.    LOS: 1 day   Azucena Freed  03/27/2012, 9:23 AM Pager: [phone]   I have taken an  interval history, reviewed the chart and examined the patient. I agree with the Billie Lade note, impression and recommendations. Left sided ulcerative colitis flare improving on Solumedrol and liquid diet. Liquids to low residue diet as tolerated.   Pricilla Riffle. Fuller Plan MD Marval Regal

## 2012-03-27 NOTE — Progress Notes (Signed)
[facility] PROGRESS NOTE  Brianna Santana VVZ:[id number] DOB: [birth year] DOA: 03/26/2012 PCP: [name], MD  Assessment/Plan: Principal Problem:  *Colitis Active Problems:  ASTHMA  ULCERATIVE COLITIS-LEFT SIDE  Anemia    *1) Abdominal pain [epigastric] with nausea-cholelithiasis on abdominal CT. Hx UC dating to 54's. Recent colonoscopy of 09/15/11 with bx of colon revealing internal hemorrhoids, mild active colitis in left colon, remainder of colon bx showed no colitis. Treated with Canasa rectal suppositories per Dr Fuller Plan beginning October 18th. On chronic Lialda for many years. .  Prednisone 20 mg, hydrocodone added 10/21 by Dr Bryon Lions for ongoing abdominal pain, some nausea and ongoing rectal bleeding.  Note that she has been taking Mobic daily since early October.  Current CT scan shows left/descending colitis.  She was started on Solu-medrol at 40 mg q 8 hours along with abx. .  * Hx CDiff colitis in 2011 and 2012. inpt Florastor added.  * Cholelithiasis, uncomplicated. Incidental CT scan finding. LFTs and Liapase normal.  * 2009 hyperplastic colon polyp  Solumedrol to 60 mg daily. D/C IV Protonix. Start po. Takes PPI prn at home.  D/C zosyn and Rocephin, started by Dr Karleen Hampshire and ED MD.  * Allow low residue diet.   2) UC/Left sided colitis on CT;as above  3) Anemia: secondary to 2. Supplement iron 4) Internal hemorrhoids.  5) GERD-will add a PPI. Marland Kitchen  6) Cholelithiasis on CT.  7) Gas and bloating: add Florastor BID PO.  8) Asthma on Proventil and Symbicort 1.   Code Status: full Family Communication: family updated about patient's clinical progress Disposition Plan:  As above    Brief narrative: 57 y.o. female.  HPI: Patient gives a history of ulcerative colitis being diagnosed about 20 years ago, by Dr. Everlean Cherry, followed over the last few years by Dr. Fuller Plan. She last saw Dr. Fuller Plan on 03/15/12 when he felt she was having a mild flare and prescribed Canasa  suppositories. The patient's symptoms worsened prompting her to go to her PCP who put her on Prednisone and Hydrocodone for pain. She however did not improve with these medications and as her symptoms gradually worsened she came to the ER today. She complains of epigastric pain and lower abdominal pain with cramping, She last had a colonoscopy done on 09/15/10 when multiple biopsies were done from the left, transverse and right colon but only the biopsies from the left colon revealed mild active colitis; the rest of the biopsies revealed normal colonic mucosa. There was no evidence of dysplasia. She denies any skin rashes, joint pains or ocular symptoms. She has been very nauseated but has not vomited. She has occasional reflux with spicy, greasy foods. There are no associated fever, chills or rigors. She has had C. Difficile colitis in 2011 and again in 2012.    Consultants: Juanita Craver, MD   Procedures:  None   Antibiotics:  Zosyn has been discontinued  HPI/Subjective: Stable  Objective: Filed Vitals:   03/26/12 1641 03/26/12 1944 03/26/12 2300 03/27/12 0712  BP: 128/66  107/67 125/67  Pulse: 81  75 83  Temp: 101.7 F (38.7 C)  97.7 F (36.5 C) 97.9 F (36.6 C)  TempSrc:   Axillary Axillary  Resp: 16  16 16   SpO2: 98% 97% 98% 96%   No intake or output data in the 24 hours ending 03/27/12 1232  Exam:  General: looks well  Heart: RRR  Chest: clear B. Not SOB  Abdomen: soft, NT, ND. No mass or HSM. Active  BS  Rectal: Erythematous appearing external hemorrhoids, no blood or tenderness on exam glove with external palpation.  Extremities: no pedal edema.  Neuro/Psych: Pleasant, not confused, disoriented or agitated.     Data Reviewed: Basic Metabolic Panel:  Lab 16/10/96 0500 03/26/12 0948  NA 142 142  K 3.9 3.6  CL 105 104  CO2 29 30  GLUCOSE 124* 90  BUN 11 11  CREATININE 0.69 0.70  CALCIUM 9.3 9.0  MG -- --  PHOS -- --    Liver Function Tests:  Lab  03/26/12 2031  AST 16  ALT 35  ALKPHOS 92  BILITOT 0.3  PROT 6.6  ALBUMIN 2.7*    Lab 03/26/12 2031  LIPASE 22  AMYLASE --   No results found for this basename: AMMONIA:5 in the last 168 hours  CBC:  Lab 03/27/12 0500 03/26/12 0948  WBC 5.3 8.2  NEUTROABS -- --  HGB 11.9* 11.4*  HCT 35.1* 33.8*  MCV 85.6 84.7  PLT 400 398    Cardiac Enzymes: No results found for this basename: CKTOTAL:5,CKMB:5,CKMBINDEX:5,TROPONINI:5 in the last 168 hours BNP (last 3 results) No results found for this basename: PROBNP:3 in the last 8760 hours   CBG: No results found for this basename: GLUCAP:5 in the last 168 hours  Recent Results (from the past 240 hour(s))  CLOSTRIDIUM DIFFICILE BY PCR     Status: Normal   Collection Time   03/26/12  5:02 PM      Component Value Range Status Comment   C difficile by pcr NEGATIVE  NEGATIVE Final      Studies: Ct Abdomen Pelvis W Contrast  03/26/2012  *RADIOLOGY REPORT*  Clinical Data: 56 year old female with abdominal and pelvic pain and rectal bleeding.  CT ABDOMEN AND PELVIS WITH CONTRAST  Technique:  Multidetector CT imaging of the abdomen and pelvis was performed following the standard protocol during bolus administration of intravenous contrast.  Contrast: 14m OMNIPAQUE IOHEXOL 300 MG/ML  SOLN  Comparison: 02/07/2010 CT  Findings: Circumferential wall thickening of the descending colon with mild adjacent inflammation is noted and compatible with colitis.  Mildly enlarged left mesenteric lymph nodes are again noted.  The visualized portions of the celiac artery, SMA, IMA and SMV are patent.  There is no evidence of bowel obstruction or pneumoperitoneum.  The liver, spleen, pancreas, adrenal glands and kidneys are unremarkable. Cholelithiasis identified without CT evidence of cholecystitis. The appendix and bladder are unremarkable.  No free fluid, biliary dilation or abdominal aortic aneurysm identified.  No acute or suspicious bony abnormalities  are noted.  IMPRESSION: Colitis of the descending colon - question inflammatory or infectious.  Although this could represent ischemic colitis given distribution, the visualized mesenteric vessels are patent.  Cholelithiasis without CT evidence of acute cholecystitis.   Original Report Authenticated By: JLura Em M.D.     Scheduled Meds:   . budesonide-formoterol  2 puff Inhalation BID  . iohexol  20 mL Oral Q1 Hr x 2  . mesalamine  1,200 mg Oral Q breakfast  . methylPREDNISolone (SOLU-MEDROL) injection  60 mg Intravenous Daily  . metronidazole  500 mg Intravenous Once  . montelukast  10 mg Oral QHS  . saccharomyces boulardii  250 mg Oral BID  . DISCONTD: cefTRIAXone (ROCEPHIN) IVPB 1 gram/50 mL D5W  1 g Intravenous Q24H  . DISCONTD: ciprofloxacin  400 mg Intravenous Once  . DISCONTD: methylPREDNISolone (SOLU-MEDROL) injection  40 mg Intravenous Q8H  . DISCONTD: pantoprazole (PROTONIX) IV  40 mg Intravenous Q12H  .  DISCONTD: piperacillin-tazobactam (ZOSYN)  IV  3.375 g Intravenous Q8H   Continuous Infusions:   . sodium chloride 50 mL/hr at 03/27/12 1041  . sodium chloride      Principal Problem:  *Colitis Active Problems:  ASTHMA  ULCERATIVE COLITIS-LEFT SIDE  Anemia    Time spent: 40 minutes   [facility] Pager [phone]. If 8PM-8AM, please contact night-coverage at [email or web address], password [facility] 03/27/2012, 12:32 PM  LOS: 1 day

## 2012-03-28 ENCOUNTER — Inpatient Hospital Stay (HOSPITAL_COMMUNITY): Payer: Managed Care, Other (non HMO)

## 2012-03-28 DIAGNOSIS — D649 Anemia, unspecified: Secondary | ICD-10-CM

## 2012-03-28 LAB — LIPASE, BLOOD: Lipase: 30 U/L (ref 11–59)

## 2012-03-28 NOTE — Progress Notes (Signed)
     [facility] Rounding Note 03/28/2012, 9:20 AM  SUBJECTIVE:       Left sided abd pain persists, worse with movement, bending etc.  7 or so loose stools yesterday, 3 so far today.  Hematochezia at this point is minor.   OBJECTIVE:         Vital signs in last 24 hours:    Temp:  [97.9 F (36.6 C)-98.2 F (36.8 C)] 98.2 F (36.8 C) (10/29 0653) Pulse Rate:  [80-88] 87  (10/29 0653) Resp:  [16-18] 18  (10/29 0653) BP: (115-122)/(61-66) 118/64 mmHg (10/29 0653) SpO2:  [92 %-99 %] 99 % (10/29 0653) Last BM Date: 03/28/12 General: non-toxic, slightly uncomfortable.    Heart: RRR  Chest: clear.  No dyspnea Abdomen: soft, active BS, ND.  Tender: moderate, on left side.  Extremities: no pedal edema Neuro/Psych:  Pleasant, slightly anxious  Intake/Output from previous day: 10/28 0701 - 10/29 0700 In: 220 [P.O.:220] Out: -   Lab Results:  Basename 03/27/12 0500 03/26/12 0948  WBC 5.3 8.2  HGB 11.9* 11.4*  HCT 35.1* 33.8*  PLT 400 398   BMET  Basename 03/27/12 0500 03/26/12 0948  NA 142 142  K 3.9 3.6  CL 105 104  CO2 29 30  GLUCOSE 124* 90  BUN 11 11  CREATININE 0.69 0.70  CALCIUM 9.3 9.0   LFT  Basename 03/26/12 2031  PROT 6.6  ALBUMIN 2.7*  AST 16  ALT 35  ALKPHOS 92  BILITOT 0.3  BILIDIR 0.1  IBILI 0.2*    Studies/Results: Ct Abdomen Pelvis W Contrast 03/26/2012    IMPRESSION: Colitis of the descending colon - question inflammatory or infectious.  Although this could represent ischemic colitis given distribution, the visualized mesenteric vessels are patent.  Cholelithiasis without CT evidence of acute cholecystitis.   Original Report Authenticated By: Lura Em, M.D.     ASSESMENT: * Diarrhea, rectal bleeding, abdominal pain since first week Oct 2013.  Hx UC dating to 1990's. 09/15/11 colonoscopy revealing internal hemorrhoids, mild active colitis in left colon, remainder of colon bx showed no colitis. Treated with Canasa suppositories per Dr Fuller Plan  beginning October 18th. On Lialda for many years. .  Prednisone 20 mg, hydrocodone added 10/21 by Dr Bryon Lions for ongoing abdominal pain, nausea, rectal bleeding.  Taking Mobic daily since early October.  Current CT scan shows left/descending colitis.  On Solumedrol 60 mg/day Left sided pain is increased.  Ultrasound is pending, ordered overnight by attending MD Loose stools persist, minor amounts of blood.  Hgb stable. * Hx CDiff colitis in 2011 and 07/2010. inpt Florastor added. C Diff negative 03/26/12 * Cholelithiasis, uncomplicated. Incidental CT scan finding. LFTs and Liapase normal.  * 2009 hyperplastic colon polyp    PLAN: *  Await results of abd ultrasound, completed this AM. *  Continue Solumedrol at 60 mg daily.   *  Send stool for O &P, cultures, lactoferrin   LOS: 2 days   Azucena Freed  03/28/2012, 9:20 AM Pager: [phone]    I have taken an interval history, reviewed the chart and examined the patient. I agree with Billie Lade note, impression and recommendations. Ulcerative colitis flare which has been slow to respond to IV corticosteroids. Asymptomatic cholelithiasis. Check stool studies for possible infectious cause of colitis. Continue current management.  Pricilla Riffle. Fuller Plan MD Marval Regal

## 2012-03-28 NOTE — Progress Notes (Signed)
[facility] PROGRESS NOTE  Brianna Santana CNO:[id number] DOB: 12/13/1955 DOA: 03/26/2012 PCP: [name], MD  Assessment/Plan: Principal Problem:  *Colitis Active Problems:  ASTHMA  ULCERATIVE COLITIS-LEFT SIDE  Anemia    1) Abdominal pain [epigastric] with nausea-cholelithiasis on abdominal CT. Hx UC dating to 49's. Recent colonoscopy of 09/15/11 with bx of colon revealing internal hemorrhoids, mild active colitis in left colon, remainder of colon bx showed no colitis. Treated with Canasa rectal suppositories per Dr Fuller Plan beginning October 18th. On chronic Lialda for many years. .  Complaining of left upper quadrant pain, concern about gallstones, requesting further testing, ultrasound was ordered yesterday, reassured the patient that this is secondary to colitis Prednisone 20 mg, hydrocodone added 10/21 by Dr Bryon Lions for ongoing abdominal pain, some nausea and ongoing rectal bleeding.  .  Current CT scan shows left/descending colitis.  She was started on Solu-medrol at 40 mg q 8 hours along with abx. .  * Hx CDiff colitis in 2011 and 2012. inpt Florastor added.  * Cholelithiasis, uncomplicated. Incidental CT scan finding. LFTs and Liapase normal.  * 2009 hyperplastic colon polyp  Solumedrol to 60 mg daily. D/C IV Protonix. Start po. Takes PPI prn at home.  Antibiotics have been discontinued Low residue diet per GI   2) UC/Left sided colitis on CT;as above  3) Anemia: secondary to 2. Supplement iron  4) Internal hemorrhoids.  5) GERD-will add a PPI. Marland Kitchen  6) Cholelithiasis on CT.  7) Gas and bloating: add Florastor BID PO.  8) Asthma on Proventil and Symbicort   Code Status: full  Family Communication: family updated about patient's clinical progress  Disposition Plan: As above    Brief narrative:  56 y.o. female.  HPI: Patient gives a history of ulcerative colitis being diagnosed about 20 years ago, by Dr. Everlean Cherry, followed over the last few years by Dr. Fuller Plan.  She last saw Dr. Fuller Plan on 03/15/12 when he felt she was having a mild flare and prescribed Canasa suppositories. The patient's symptoms worsened prompting her to go to her PCP who put her on Prednisone and Hydrocodone for pain. She however did not improve with these medications and as her symptoms gradually worsened she came to the ER today. She complains of epigastric pain and lower abdominal pain with cramping, She last had a colonoscopy done on 09/15/10 when multiple biopsies were done from the left, transverse and right colon but only the biopsies from the left colon revealed mild active colitis; the rest of the biopsies revealed normal colonic mucosa. There was no evidence of dysplasia. She denies any skin rashes, joint pains or ocular symptoms. She has been very nauseated but has not vomited. She has occasional reflux with spicy, greasy foods. There are no associated fever, chills or rigors. She has had C. Difficile colitis in 2011 and again in 2012.  Consultants:  Juanita Craver, MD  Procedures:  None  Antibiotics:  Zosyn has been discontinued HPI/Subjective:  Left upper quadrant pain is somewhat improved  Objective: Filed Vitals:   03/27/12 1400 03/27/12 1932 03/27/12 2135 03/28/12 0653  BP: 122/61  115/66 118/64  Pulse: 88  80 87  Temp: 98.2 F (36.8 C)  97.9 F (36.6 C) 98.2 F (36.8 C)  TempSrc:      Resp: 16  16 18   SpO2: 98% 92% 98% 99%    Intake/Output Summary (Last 24 hours) at 03/28/12 0858 Last data filed at 03/27/12 1200  Gross per 24 hour  Intake  100 ml  Output      0 ml  Net    100 ml    Exam:  HENT:  Head: Atraumatic.  Nose: Nose normal.  Mouth/Throat: Oropharynx is clear and moist.  Eyes: Conjunctivae are normal. Pupils are equal, round, and reactive to light. No scleral icterus.  Neck: Neck supple. No tracheal deviation present.  Cardiovascular: Normal rate, regular rhythm, normal heart sounds and intact distal pulses.  Pulmonary/Chest: Effort normal  and breath sounds normal. No respiratory distress.  Abdominal: Soft. Normal appearance and bowel sounds are normal. She exhibits no distension. There is no tenderness.  Musculoskeletal: She exhibits no edema and no tenderness.  Neurological: She is alert. No cranial nerve deficit.    Data Reviewed: Basic Metabolic Panel:  Lab 61/44/31 0500 03/26/12 0948  NA 142 142  K 3.9 3.6  CL 105 104  CO2 29 30  GLUCOSE 124* 90  BUN 11 11  CREATININE 0.69 0.70  CALCIUM 9.3 9.0  MG -- --  PHOS -- --    Liver Function Tests:  Lab 03/26/12 2031  AST 16  ALT 35  ALKPHOS 92  BILITOT 0.3  PROT 6.6  ALBUMIN 2.7*    Lab 03/26/12 2031  LIPASE 22  AMYLASE --   No results found for this basename: AMMONIA:5 in the last 168 hours  CBC:  Lab 03/27/12 0500 03/26/12 0948  WBC 5.3 8.2  NEUTROABS -- --  HGB 11.9* 11.4*  HCT 35.1* 33.8*  MCV 85.6 84.7  PLT 400 398    Cardiac Enzymes: No results found for this basename: CKTOTAL:5,CKMB:5,CKMBINDEX:5,TROPONINI:5 in the last 168 hours BNP (last 3 results) No results found for this basename: PROBNP:3 in the last 8760 hours   CBG: No results found for this basename: GLUCAP:5 in the last 168 hours  Recent Results (from the past 240 hour(s))  CLOSTRIDIUM DIFFICILE BY PCR     Status: Normal   Collection Time   03/26/12  5:02 PM      Component Value Range Status Comment   C difficile by pcr NEGATIVE  NEGATIVE Final      Studies: Ct Abdomen Pelvis W Contrast  03/26/2012  *RADIOLOGY REPORT*  Clinical Data: 56 year old female with abdominal and pelvic pain and rectal bleeding.  CT ABDOMEN AND PELVIS WITH CONTRAST  Technique:  Multidetector CT imaging of the abdomen and pelvis was performed following the standard protocol during bolus administration of intravenous contrast.  Contrast: 155m OMNIPAQUE IOHEXOL 300 MG/ML  SOLN  Comparison: 02/07/2010 CT  Findings: Circumferential wall thickening of the descending colon with mild adjacent  inflammation is noted and compatible with colitis.  Mildly enlarged left mesenteric lymph nodes are again noted.  The visualized portions of the celiac artery, SMA, IMA and SMV are patent.  There is no evidence of bowel obstruction or pneumoperitoneum.  The liver, spleen, pancreas, adrenal glands and kidneys are unremarkable. Cholelithiasis identified without CT evidence of cholecystitis. The appendix and bladder are unremarkable.  No free fluid, biliary dilation or abdominal aortic aneurysm identified.  No acute or suspicious bony abnormalities are noted.  IMPRESSION: Colitis of the descending colon - question inflammatory or infectious.  Although this could represent ischemic colitis given distribution, the visualized mesenteric vessels are patent.  Cholelithiasis without CT evidence of acute cholecystitis.   Original Report Authenticated By: JLura Em M.D.     Scheduled Meds:   . budesonide-formoterol  2 puff Inhalation BID  . mesalamine  1,200 mg Oral Q breakfast  .  methylPREDNISolone (SOLU-MEDROL) injection  60 mg Intravenous Daily  . montelukast  10 mg Oral QHS  . saccharomyces boulardii  250 mg Oral BID  . DISCONTD: cefTRIAXone (ROCEPHIN) IVPB 1 gram/50 mL D5W  1 g Intravenous Q24H  . DISCONTD: methylPREDNISolone (SOLU-MEDROL) injection  40 mg Intravenous Q8H  . DISCONTD: pantoprazole (PROTONIX) IV  40 mg Intravenous Q12H  . DISCONTD: piperacillin-tazobactam (ZOSYN)  IV  3.375 g Intravenous Q8H   Continuous Infusions:   . sodium chloride 50 mL/hr at 03/28/12 0724  . sodium chloride      Principal Problem:  *Colitis Active Problems:  ASTHMA  ULCERATIVE COLITIS-LEFT SIDE  Anemia    Time spent: 40 minutes   [facility] Pager [phone]. If 8PM-8AM, please contact night-coverage at [email or web address], password [facility] 03/28/2012, 8:58 AM  LOS: 2 days

## 2012-03-28 NOTE — Progress Notes (Signed)
Notified Dr. Allyson Sabal that patient was concerned about her care and felt like she should  have some invasive testing done, because she was having unbearable pain to the LUQ of her abdomen radiating to L breast. New orders for Ultrasound, UA, and increase Vicodin from 1 tab to 1-2 tabs. Resident kept NPO after MN for Ultrasound. Pain management slightly effective. Vital Signs WNL. Abdomen is hard to LUQ. Patient continues to have loose stools. No c/o SOB or respiratory distress. Will continue to monitor. Patient informed of new orders and agrees.

## 2012-03-29 ENCOUNTER — Encounter (HOSPITAL_COMMUNITY): Payer: Self-pay | Admitting: Gastroenterology

## 2012-03-29 ENCOUNTER — Encounter (HOSPITAL_COMMUNITY)
Admission: EM | Disposition: A | Discharge status: Home / Self Care | Payer: Self-pay | Source: Home / Self Care | Attending: Internal Medicine

## 2012-03-29 DIAGNOSIS — K5289 Other specified noninfective gastroenteritis and colitis: Secondary | ICD-10-CM

## 2012-03-29 HISTORY — PX: FLEXIBLE SIGMOIDOSCOPY: SHX5431

## 2012-03-29 LAB — CBC
HCT: 34 % — ABNORMAL LOW (ref 36.0–46.0)
HCT: 33 % — ABNORMAL LOW (ref 36.0–46.0)
HCT: 32.7 % — ABNORMAL LOW (ref 36.0–46.0)
Hemoglobin: 11.1 g/dL — ABNORMAL LOW (ref 12.0–15.0)
Hemoglobin: 11 g/dL — ABNORMAL LOW (ref 12.0–15.0)
Hemoglobin: 11 g/dL — ABNORMAL LOW (ref 12.0–15.0)
MCH: 28.3 pg (ref 26.0–34.0)
MCH: 28.7 pg (ref 26.0–34.0)
MCH: 28.9 pg (ref 26.0–34.0)
MCHC: 32.6 g/dL (ref 30.0–36.0)
MCHC: 33.3 g/dL (ref 30.0–36.0)
MCHC: 33.6 g/dL (ref 30.0–36.0)
MCV: 86.7 fL (ref 78.0–100.0)
MCV: 86.2 fL (ref 78.0–100.0)
MCV: 86.1 fL (ref 78.0–100.0)
Platelets: 448 10*3/uL — ABNORMAL HIGH (ref 150–400)
Platelets: 436 10*3/uL — ABNORMAL HIGH (ref 150–400)
Platelets: 420 10*3/uL — ABNORMAL HIGH (ref 150–400)
RBC: 3.92 MIL/uL (ref 3.87–5.11)
RBC: 3.83 MIL/uL — ABNORMAL LOW (ref 3.87–5.11)
RBC: 3.8 MIL/uL — ABNORMAL LOW (ref 3.87–5.11)
RDW: 13.2 % (ref 11.5–15.5)
RDW: 13.2 % (ref 11.5–15.5)
RDW: 13 % (ref 11.5–15.5)
WBC: 7.3 10*3/uL (ref 4.0–10.5)
WBC: 7.1 10*3/uL (ref 4.0–10.5)
WBC: 4.5 10*3/uL (ref 4.0–10.5)

## 2012-03-29 LAB — FECAL LACTOFERRIN, QUANT: Fecal Lactoferrin: POSITIVE

## 2012-03-29 LAB — URINE CULTURE
Colony Count: NO GROWTH
Culture: NO GROWTH

## 2012-03-29 LAB — OVA AND PARASITE EXAMINATION: Special Requests: NORMAL

## 2012-03-29 LAB — SEDIMENTATION RATE: Sed Rate: 35 mm/hr — ABNORMAL HIGH (ref 0–22)

## 2012-03-29 SURGERY — SIGMOIDOSCOPY, FLEXIBLE
Anesthesia: Moderate Sedation

## 2012-03-29 MED ORDER — MIDAZOLAM HCL 5 MG/ML IJ SOLN
INTRAMUSCULAR | Status: AC
  Filled 2012-03-29: qty 4

## 2012-03-29 MED ORDER — MIDAZOLAM HCL 10 MG/2ML IJ SOLN
INTRAMUSCULAR | Status: DC | PRN
  Administered 2012-03-29 (×3): 2.5 mg via INTRAVENOUS

## 2012-03-29 MED ORDER — FENTANYL CITRATE 0.05 MG/ML IJ SOLN
INTRAMUSCULAR | Status: AC
  Filled 2012-03-29: qty 4

## 2012-03-29 MED ORDER — FENTANYL CITRATE 0.05 MG/ML IJ SOLN
INTRAMUSCULAR | Status: DC | PRN
  Administered 2012-03-29 (×3): 25 ug via INTRAVENOUS

## 2012-03-29 MED ORDER — SODIUM CHLORIDE 0.9 % IV SOLN
INTRAVENOUS | Status: DC
  Administered 2012-03-29: 500 mL via INTRAVENOUS

## 2012-03-29 NOTE — Progress Notes (Signed)
[facility] PROGRESS NOTE  Brianna Santana W[facility]:[id number] DOB: 02-Jun-1955 DOA: 03/26/2012 PCP: [name], MD  Assessment/Plan: Principal Problem:  *Colitis Active Problems:  ASTHMA  ULCERATIVE COLITIS-LEFT SIDE  Anemia    Abdominal pain [epigastric] with nausea-cholelithiasis on abdominal CT. Hx UC dating to 63's. Recent colonoscopy of 09/15/11 with bx of colon revealing internal hemorrhoids, mild active colitis in left colon, remainder of colon bx showed no colitis. Treated with Canasa rectal suppositories per Dr Fuller Plan beginning October 18th. On chronic Lialda for many years. .  Complaining of left upper quadrant pain, concern about gallstones, requesting further testing, ultrasound was ordered yesterday, reassured the patient that this is secondary to colitis   Prednisone 20 mg, hydrocodone added 10/21 by Dr Bryon Lions for ongoing abdominal pain, some nausea and ongoing rectal bleeding.  .  Current CT scan shows left/descending colitis.  She was started on Solu-medrol at 40 mg q 8 hours along with abx. .  * Hx CDiff colitis in 2011 and 2012. inpt Florastor added.  * Cholelithiasis, uncomplicated. Incidental CT scan finding. LFTs and Liapase normal.  * 2009 hyperplastic colon polyp  Solumedrol to 60 mg daily. Continue mesalamine, florastor D/C IV Protonix. Start po. Takes PPI prn at home.  Antibiotics have been discontinued  Low residue diet per GI , GI of following closely   2) UC/Left sided colitis on CT;as above  3) Anemia: secondary to ulcerative colitis, Supplement iron  4) Internal hemorrhoids.  5) GERD-will add a PPI. Marland Kitchen  6) Cholelithiasis on CT.  7) Gas and bloating: add Florastor BID PO.  8) Asthma on Proventil and Symbicort    Code Status: full  Family Communication: family updated about patient's clinical progress  Disposition Plan: As above    Brief narrative:  56 y.o. female.  HPI: Patient gives a history of ulcerative colitis being diagnosed  about 20 years ago, by Dr. Everlean Cherry, followed over the last few years by Dr. Fuller Plan. She last saw Dr. Fuller Plan on 03/15/12 when he felt she was having a mild flare and prescribed Canasa suppositories. The patient's symptoms worsened prompting her to go to her PCP who put her on Prednisone and Hydrocodone for pain. She however did not improve with these medications and as her symptoms gradually worsened she came to the ER today. She complains of epigastric pain and lower abdominal pain with cramping, She last had a colonoscopy done on 09/15/10 when multiple biopsies were done from the left, transverse and right colon but only the biopsies from the left colon revealed mild active colitis; the rest of the biopsies revealed normal colonic mucosa. There was no evidence of dysplasia. She denies any skin rashes, joint pains or ocular symptoms. She has been very nauseated but has not vomited. She has occasional reflux with spicy, greasy foods. There are no associated fever, chills or rigors. She has had C. Difficile colitis in 2011 and again in 2012.  Consultants:  Juanita Craver, MD  Procedures:  None  Antibiotics:  Zosyn has been discontinued  HPI/Subjective: Pt had three stools during the night. First stool was soft brown stool with bloody streaks. The subsequent two stools were mostly water with bright red blood. Rectum inspected . Small amount of blood visible on one hemorrhoid. Pt very anxious about the blood she is passing. Pain continues in her left lower quadrant   Objective: Filed Vitals:   03/28/12 1959 03/28/12 2303 03/29/12 0639 03/29/12 0809  BP:  102/56 116/66   Pulse:  81 84  Temp:  98.1 F (36.7 C) 98.9 F (37.2 C)   TempSrc:      Resp:  16 16   Height: 5' 4"  (1.626 m)     Weight: 77.111 kg (170 lb)     SpO2: 97% 98% 98% 96%    Intake/Output Summary (Last 24 hours) at 03/29/12 0905 Last data filed at 03/29/12 0600  Gross per 24 hour  Intake   1260 ml  Output      0 ml  Net   1260 ml      Exam:  HENT:  Head: Atraumatic.  Nose: Nose normal.  Mouth/Throat: Oropharynx is clear and moist.  Eyes: Conjunctivae are normal. Pupils are equal, round, and reactive to light. No scleral icterus.  Neck: Neck supple. No tracheal deviation present.  Cardiovascular: Normal rate, regular rhythm, normal heart sounds and intact distal pulses.  Pulmonary/Chest: Effort normal and breath sounds normal. No respiratory distress.  Abdominal: Soft. Normal appearance and bowel sounds are normal. She exhibits no distension. There is no tenderness.  Musculoskeletal: She exhibits no edema and no tenderness.  Neurological: She is alert. No cranial nerve deficit.    Data Reviewed: Basic Metabolic Panel:  Lab 63/14/97 0500 03/26/12 0948  NA 142 142  K 3.9 3.6  CL 105 104  CO2 29 30  GLUCOSE 124* 90  BUN 11 11  CREATININE 0.69 0.70  CALCIUM 9.3 9.0  MG -- --  PHOS -- --    Liver Function Tests:  Lab 03/26/12 2031  AST 16  ALT 35  ALKPHOS 92  BILITOT 0.3  PROT 6.6  ALBUMIN 2.7*    Lab 03/28/12 0939 03/26/12 2031  LIPASE 30 22  AMYLASE -- --   No results found for this basename: AMMONIA:5 in the last 168 hours  CBC:  Lab 03/27/12 0500 03/26/12 0948  WBC 5.3 8.2  NEUTROABS -- --  HGB 11.9* 11.4*  HCT 35.1* 33.8*  MCV 85.6 84.7  PLT 400 398    Cardiac Enzymes: No results found for this basename: CKTOTAL:5,CKMB:5,CKMBINDEX:5,TROPONINI:5 in the last 168 hours BNP (last 3 results) No results found for this basename: PROBNP:3 in the last 8760 hours   CBG: No results found for this basename: GLUCAP:5 in the last 168 hours  Recent Results (from the past 240 hour(s))  CLOSTRIDIUM DIFFICILE BY PCR     Status: Normal   Collection Time   03/26/12  5:02 PM      Component Value Range Status Comment   C difficile by pcr NEGATIVE  NEGATIVE Final   URINE CULTURE     Status: Normal   Collection Time   03/27/12 10:17 PM      Component Value Range Status Comment   Specimen  Description URINE, CLEAN CATCH   Final    Special Requests CX ADDED AT 2241   Final    Culture  Setup Time 03/28/2012 04:29   Final    Colony Count NO GROWTH   Final    Culture NO GROWTH   Final    Report Status 03/29/2012 FINAL   Final      Studies: US Abdomen Complete  03/28/2012  *RADIOLOGY REPORT*  Clinical Data:  Gallstones  ABDOMINAL ULTRASOUND COMPLETE  Comparison:  03/26/2012  Findings:  Gallbladder:  Multiple stones are noted within the lumen of the gallbladder.  These measure up to 1.6 cm.  No wall thickening or pericholecystic fluid.  Negative sonographic Murphy's sign.  Common Bile Duct:  Within normal limits in  caliber.  Liver: No focal mass lesion identified.  Within normal limits in parenchymal echogenicity.  IVC:  Appears normal.  Pancreas:  No abnormality identified.  Spleen:  Within normal limits in size and echotexture.  Right kidney:  Normal in size and parenchymal echogenicity.  No evidence of mass or hydronephrosis.  Left kidney:  Normal in size and parenchymal echogenicity.  No evidence of mass or hydronephrosis.  Abdominal Aorta:  No aneurysm identified.  IMPRESSION:  1.  Gallstones.   Original Report Authenticated By: Angelita Ingles, M.D.    Ct Abdomen Pelvis W Contrast  03/26/2012  *RADIOLOGY REPORT*  Clinical Data: 56 year old female with abdominal and pelvic pain and rectal bleeding.  CT ABDOMEN AND PELVIS WITH CONTRAST  Technique:  Multidetector CT imaging of the abdomen and pelvis was performed following the standard protocol during bolus administration of intravenous contrast.  Contrast: 15m OMNIPAQUE IOHEXOL 300 MG/ML  SOLN  Comparison: 02/07/2010 CT  Findings: Circumferential wall thickening of the descending colon with mild adjacent inflammation is noted and compatible with colitis.  Mildly enlarged left mesenteric lymph nodes are again noted.  The visualized portions of the celiac artery, SMA, IMA and SMV are patent.  There is no evidence of bowel obstruction or  pneumoperitoneum.  The liver, spleen, pancreas, adrenal glands and kidneys are unremarkable. Cholelithiasis identified without CT evidence of cholecystitis. The appendix and bladder are unremarkable.  No free fluid, biliary dilation or abdominal aortic aneurysm identified.  No acute or suspicious bony abnormalities are noted.  IMPRESSION: Colitis of the descending colon - question inflammatory or infectious.  Although this could represent ischemic colitis given distribution, the visualized mesenteric vessels are patent.  Cholelithiasis without CT evidence of acute cholecystitis.   Original Report Authenticated By: JLura Em M.D.     Scheduled Meds:   . budesonide-formoterol  2 puff Inhalation BID  . mesalamine  1,200 mg Oral Q breakfast  . methylPREDNISolone (SOLU-MEDROL) injection  60 mg Intravenous Daily  . montelukast  10 mg Oral QHS  . saccharomyces boulardii  250 mg Oral BID   Continuous Infusions:   . sodium chloride 50 mL/hr at 03/29/12 0600  . sodium chloride      Principal Problem:  *Colitis Active Problems:  ASTHMA  ULCERATIVE COLITIS-LEFT SIDE  Anemia    Time spent: 40 minutes   [facility] Pager [phone] If 8PM-8AM, please contact night-coverage at [email or web address], password [email or web address]10/30/2013, 9:05 AM  LOS: 3 days

## 2012-03-29 NOTE — Progress Notes (Signed)
Pt had three stools during the night. First stool was soft brown stool with bloody streaks. The subsequent two stools were mostly water with bright red blood. Rectum inspected . Small amount of blood visible on one hemorrhoid. Pt very anxious about the blood she is passing. Pain continues in her left lower quadrant.

## 2012-03-29 NOTE — Interval H&P Note (Signed)
History and Physical Interval Note:  03/29/2012 1:03 PM  Brianna Santana  has presented today for surgery, with the diagnosis of rectal bleeding, colitis  The various methods of treatment have been discussed with the patient and family. After consideration of risks, benefits and other options for treatment, the patient has consented to  Procedure(s) (LRB) with comments: FLEXIBLE SIGMOIDOSCOPY (N/A) as a surgical intervention .  The patient's history has been reviewed, patient examined, no change in status, stable for surgery.  I have reviewed the patient's chart and labs.  Questions were answered to the patient's satisfaction.     Pricilla Riffle. Fuller Plan MD Mar[facility] Regal

## 2012-03-29 NOTE — Op Note (Signed)
[facility] [place], [address]   FLEXIBLE SIGMOIDOSCOPY PROCEDURE REPORT  PATIENT: Brianna Santana, Brianna Santana  MR#: [id number] BIRTHDATE: 03/03/1956 , 32  yrs. old GENDER: Female ENDOSCOPIST: Ladene Artist, MD, [facility] PROCEDURE DATE:  03/29/2012 PROCEDURE:   Sigmoidoscopy with biopsy ASA CLASS:   Class II INDICATIONS: unexplained diarrhea,  hematochezia, abnormal CT of descending colon MEDICATIONS:medications were titrated to patient response per physician's verbal order, Fentanyl 75 mcg IV, and Versed 7 mg IV DESCRIPTION OF PROCEDURE:   After the risks benefits and alternatives of the procedure were thoroughly explained, informed consent was obtained.  revealed no abnormalities of the rectum. The endoscope was introduced through the anus  and advanced to the splenic flexure , limited by No adverse events experienced.   The quality of the prep was fair .  The instrument was then slowly withdrawn as the mucosa was fully examined.   COLON FINDINGS: Severe colitis from descending colon to splenic flexure with deep ulcers, edema, friable musoca.  Multiple biopsies obtained. Findings not typical of ulcerative colitis.  The sigmoid colon and rectum appeared normal.  Retroflexed views revealed moderate internal hemorrhoids.  The scope was then withdrawn from the patient and the procedure terminated.  COMPLICATIONS: There were no complications.  ENDOSCOPIC IMPRESSION: 1.  Severe colitis from descending colon to splenic flexure-multiple biopsies obtained 2.  Sigmoid colon and rectum appeared normal 3.  Moderate internal hemorrhoids  RECOMMENDATIONS: 1.  await biopsy results    eSigned:  Ladene Artist, MD, [facility] 03/29/2012 1:55 PM

## 2012-03-29 NOTE — Progress Notes (Signed)
[facility] Daily Rounding Note 03/29/2012, 9:08 AM  SUBJECTIVE:       Bloody stool started back last PM.  2 to 3 already today.  She estimates about 30 cc per event.  Left abd pain persists. No nausea.  On clears. IV solumedrol continues along with her chronic Lialda, Florastor.   OBJECTIVE:         Vital signs in last 24 hours:    Temp:  [98.1 F (36.7 C)-98.9 F (37.2 C)] 98.9 F (37.2 C) (10/30 0639) Pulse Rate:  [81-89] 84  (10/30 0639) Resp:  [16] 16  (10/30 0639) BP: (102-116)/(56-66) 116/66 mmHg (10/30 0639) SpO2:  [96 %-98 %] 96 % (10/30 0809) Weight:  [77.111 kg (170 lb)] 77.111 kg (170 lb) (10/29 1959) Last BM Date: 03/29/12 General: Looks well, just uncomfortable   Heart: RRR.  No MRG Chest: clear.  No resp distress Abdomen: soft, BS hypoactive.  Left sided tenderness but no G or R.   Extremities: no pedal edema Neuro/Psych:  Pleasant, not confused.  Intake/Output from previous day: 10/29 0701 - 10/30 0700 In: 1260 [P.O.:360; I.V.:900] Out: -   Labs: Stool fecal lactoferrin positive.  stool O&P, stool clx both pending  Studies/Results: US Abdomen Complete 03/28/2012  *RADIOLOGY REPORT*  Clinical Data:  Gallstones  ABDOMINAL ULTRASOUND COMPLETE  Comparison:  03/26/2012  Findings:  Gallbladder:  Multiple stones are noted within the lumen of the gallbladder.  These measure up to 1.6 cm.  No wall thickening or pericholecystic fluid.  Negative sonographic Murphy's sign.  Common Bile Duct:  Within normal limits in caliber.  Liver: No focal mass lesion identified.  Within normal limits in parenchymal echogenicity.  IVC:  Appears normal.  Pancreas:  No abnormality identified.  Spleen:  Within normal limits in size and echotexture.  Right kidney:  Normal in size and parenchymal echogenicity.  No evidence of mass or hydronephrosis.  Left kidney:  Normal in size and parenchymal echogenicity.  No evidence of mass or hydronephrosis.  Abdominal Aorta:  No aneurysm identified.   IMPRESSION:  1.  Gallstones.   Original Report Authenticated By: Angelita Ingles, M.D.     ASSESMENT: * Diarrhea, rectal bleeding, abdominal pain since first week Oct 2013.  Hx UC dating to 1990's. 09/15/11 colonoscopy revealing internal hemorrhoids, mild active colitis in left colon, remainder of colon bx showed no colitis. Treated with Canasa suppositories per Dr Fuller Plan beginning October 18th. On Lialda for many years. .  Prednisone 20 mg, hydrocodone added 10/21 by Dr Bryon Lions for ongoing abdominal pain, nausea, rectal bleeding.  Taking Mobic daily since early October.  Current CT scan shows left/descending colitis. On Solumedrol 60 mg/day  Left sided pain is increased. Ultrasound and LFTs/lipase negative/unrevealling. Loose stools persist, minor amounts of blood. Hgb stable.  * Hx CDiff colitis in 2011 and 07/2010. inpt Florastor added. C Diff negative 03/26/12  *  Anxiety.  * Cholelithiasis, uncomplicated. Incidental CT scan finding. LFTs and Liapase normal.  * 2009 hyperplastic colon polyp     PLAN: *  Wonder if we should pursue a flex sig?  I am wondering if we aren't dealing with superimposed ischemic colitis s refractory UC.  Will d/w Dr Fuller Plan.  *  CBC in AM.  Dr Fuller Plan wants to proceed with Flex sig today.  Will order tap water enema pre op.   LOS: 3 days   Azucena Freed  03/29/2012, 9:08 AM Pager: [phone]    I have taken an interval  history, reviewed the chart and examined the patient. I agree with Billie Lade note, impression and recommendations. Rectal bleeding resumed last night. Diarrhea and left sided pain persist. Possible infectious or ischemic colitis with ulcerative colitis. Will proceed with Flex Sig with biopsies today.  Pricilla Riffle. Fuller Plan MD [facility] Regal

## 2012-03-30 ENCOUNTER — Encounter (HOSPITAL_COMMUNITY): Payer: Self-pay | Admitting: Gastroenterology

## 2012-03-30 LAB — CBC
HCT: 30.5 % — ABNORMAL LOW (ref 36.0–46.0)
Hemoglobin: 10 g/dL — ABNORMAL LOW (ref 12.0–15.0)
MCH: 28.2 pg (ref 26.0–34.0)
MCHC: 32.8 g/dL (ref 30.0–36.0)
MCV: 86.2 fL (ref 78.0–100.0)
Platelets: 413 10*3/uL — ABNORMAL HIGH (ref 150–400)
RBC: 3.54 MIL/uL — ABNORMAL LOW (ref 3.87–5.11)
RDW: 13.1 % (ref 11.5–15.5)
WBC: 6 10*3/uL (ref 4.0–10.5)

## 2012-03-30 MED ORDER — DICYCLOMINE HCL 10 MG PO CAPS
10.0000 mg | ORAL_CAPSULE | Freq: Three times a day (TID) | ORAL | Status: DC
  Administered 2012-03-30 – 2012-04-04 (×15): 10 mg via ORAL
  Filled 2012-03-30 (×18): qty 1

## 2012-03-30 MED ORDER — TUBERCULIN PPD 5 UNIT/0.1ML ID SOLN
5.0000 [IU] | Freq: Once | INTRADERMAL | Status: AC
  Administered 2012-03-30: 5 [IU] via INTRADERMAL
  Filled 2012-03-30 (×2): qty 0.1

## 2012-03-30 NOTE — Progress Notes (Signed)
[facility] Daily Rounding Note 03/30/2012, 8:37 AM  SUBJECTIVE:       She is scrupulously documenting every incidence of bloody stools, 11 events since 10 PM last night.  The left abdominal pain persists.  Occasional nausea, but not often, and no vomiting.  Anorexic.  Urinating well, no oliguria  OBJECTIVE:         Vital signs in last 24 hours:    Temp:  [98.1 F (36.7 C)-99.1 F (37.3 C)] 98.1 F (36.7 C) (10/31 0600) Pulse Rate:  [74-85] 78  (10/31 0600) Resp:  [12-49] 18  (10/31 0600) BP: (99-137)/(54-87) 108/61 mmHg (10/31 0600) SpO2:  [95 %-100 %] 100 % (10/31 0600) Last BM Date: 03/23/12 General: not toxic looking, but beginning to look more tired and worn-out.    Heart: RRR.   Chest: clear.  Unlabored breathing Abdomen: soft, tender on left, slightly better than past days  Extremities: no pedal edema. Neuro/Psych:  Pleasant, less anxious. Very alert.   Intake/Output from previous day: 10/30 0701 - 10/31 0700 In: 240 [P.O.:240] Out: -   Intake/Output this shift:    Lab Results:  Basename 03/30/12 0505 03/29/12 2149 03/29/12 1041  WBC 6.0 4.5 7.1  HGB 10.0* 11.0* 11.0*  HCT 30.5* 32.7* 33.0*  PLT 413* 420* 436*    Studies/Results: US Abdomen Complete  03/28/2012  *RADIOLOGY REPORT*  Clinical Data:  Gallstones  ABDOMINAL ULTRASOUND COMPLETE  Comparison:  03/26/2012  Findings:  Gallbladder:  Multiple stones are noted within the lumen of the gallbladder.  These measure up to 1.6 cm.  No wall thickening or pericholecystic fluid.  Negative sonographic Murphy's sign.  Common Bile Duct:  Within normal limits in caliber.  Liver: No focal mass lesion identified.  Within normal limits in parenchymal echogenicity.  IVC:  Appears normal.  Pancreas:  No abnormality identified.  Spleen:  Within normal limits in size and echotexture.  Right kidney:  Normal in size and parenchymal echogenicity.  No evidence of mass or hydronephrosis.  Left kidney:  Normal in size and  parenchymal echogenicity.  No evidence of mass or hydronephrosis.  Abdominal Aorta:  No aneurysm identified.  IMPRESSION:  1.  Gallstones.   Original Report Authenticated By: Angelita Ingles, M.D.     ASSESMENT: *  Severe left sided colitis.  Known UC, endoscopic appearance not typical of UC.  Biopsies pending. On IV Solumedrol, Lialda.  Bloody stools and left abd pain  C Diff -, Fecal Lactoferrin +, abundant stool  WBCs but no O & P.  WBC count is normal, has not been elevated at all.   Got one dose Rocephin, one dose Metronidazole day one.  Florastor d/cd 10/30.  PLAN: *  Ordered stool pathogen PCR test which tests for multiple pathogens including E. Coli subtypes, Shigella, Campy, C diff.... *  Added Bentyl, TID.  *  CBC in AM. *  Wonder if we should begin to step down the steroids? *  Wonder if we can allow full liquids?, she is tolerating clears.     LOS: 4 days   Azucena Freed  03/30/2012, 8:37 AM Pager: [phone]    I have taken an interval history, reviewed the chart and examined the patient. I agree with Billie Lade note, impression and recommendations. Severe segmental colitis that has an endoscopic pattern more typical for Crohn's colitis. Biopsies reviewed and are c/w IBD. Awaiting CMV stains. Will need infliximab, or another biologic, if she fails to improve on IV corticosteroids. Would  stay on clear liquids only for now.  Pricilla Riffle. Fuller Plan MD [facility] Regal

## 2012-03-30 NOTE — Progress Notes (Signed)
[facility] PROGRESS NOTE  Brianna Santana [id number]:[id number] DOB: Dec 15, 1955 DOA: 03/26/2012 PCP: [name], MD  Assessment/Plan: Principal Problem:  *Colitis Active Problems:  ASTHMA  ULCERATIVE COLITIS-LEFT SIDE  Anemia     Abdominal pain [epigastric] with nausea-cholelithiasis on abdominal CT. Hx UC dating to 6's. Recent colonoscopy of 09/15/11 with bx of colon revealing internal hemorrhoids, mild active colitis in left colon, remainder of colon bx showed no colitis. Treated with Canasa rectal suppositories per Dr Fuller Plan beginning October 18th. On chronic Lialda for many years. .  Complaining of left upper quadrant pain, concern about gallstones, requesting further testing, ultrasound was ordered yesterday, reassured the patient that this is secondary to colitis  Prednisone 20 mg, hydrocodone added 10/21 by Dr Bryon Lions for ongoing abdominal pain, some nausea and ongoing rectal bleeding.  .  Current CT scan shows left/descending colitis.  She was started on Solu-medrol at 40 mg q 8 hours along with abx. .  * Hx CDiff colitis in 2011 and 2012. inpt Florastor added.  * Cholelithiasis, uncomplicated. Incidental CT scan finding. LFTs and Liapase normal.  * 2009 hyperplastic colon polyp  Currently on Solumedrol to 60 mg daily. Continue mesalamine, florastor  D/C IV Protonix.  Status post flexible sigmoidoscopy yesterday that showed severe colitis Antibiotics have been discontinued  Advance to full liquid diet, GI of following closely    2) UC/Left sided colitis on CT;as above  3) Anemia: secondary to ulcerative colitis, hemoglobin trending down slowly  4) Internal hemorrhoids.  5) GERD-will add a PPI. Marland Kitchen  6) Cholelithiasis on CT.  7) Gas and bloating: add Florastor BID PO.  8) Asthma on Proventil and Symbicort    Code Status: full  Family Communication: family updated about patient's clinical progress  Disposition Plan: As above    Brief narrative:  56 y.o.  female.  HPI: Patient gives a history of ulcerative colitis being diagnosed about 20 years ago, by Dr. Everlean Cherry, followed over the last few years by Dr. Fuller Plan. She last saw Dr. Fuller Plan on 03/15/12 when he felt she was having a mild flare and prescribed Canasa suppositories. The patient's symptoms worsened prompting her to go to her PCP who put her on Prednisone and Hydrocodone for pain. She however did not improve with these medications and as her symptoms gradually worsened she came to the ER today. She complains of epigastric pain and lower abdominal pain with cramping, She last had a colonoscopy done on 09/15/10 when multiple biopsies were done from the left, transverse and right colon but only the biopsies from the left colon revealed mild active colitis; the rest of the biopsies revealed normal colonic mucosa. There was no evidence of dysplasia. She denies any skin rashes, joint pains or ocular symptoms. She has been very nauseated but has not vomited. She has occasional reflux with spicy, greasy foods. There are no associated fever, chills or rigors. She has had C. Difficile colitis in 2011 and again in 2012.  Consultants:  Juanita Craver, MD  Procedures:  None  Antibiotics:  Zosyn has been discontinued  HPI/Subjective:  Still has some bright blood per rectum this morning  Objective: Filed Vitals:   03/29/12 1513 03/29/12 2116 03/29/12 2159 03/30/12 0600  BP: 114/66  111/69 108/61  Pulse: 82 85 74 78  Temp: 98.3 F (36.8 C)  98.2 F (36.8 C) 98.1 F (36.7 C)  TempSrc:      Resp: 18 15 18 18   Height:      Weight:  SpO2: 97%  98% 100%    Intake/Output Summary (Last 24 hours) at 03/30/12 0814 Last data filed at 03/29/12 1500  Gross per 24 hour  Intake    240 ml  Output      0 ml  Net    240 ml    Exam:  HENT:  Head: Atraumatic.  Nose: Nose normal.  Mouth/Throat: Oropharynx is clear and moist.  Eyes: Conjunctivae are normal. Pupils are equal, round, and reactive to light. No  scleral icterus.  Neck: Neck supple. No tracheal deviation present.  Cardiovascular: Normal rate, regular rhythm, normal heart sounds and intact distal pulses.  Pulmonary/Chest: Effort normal and breath sounds normal. No respiratory distress.  Abdominal: Soft. Normal appearance and bowel sounds are normal. She exhibits no distension. There is no tenderness.  Musculoskeletal: She exhibits no edema and no tenderness.  Neurological: She is alert. No cranial nerve deficit.    Data Reviewed: Basic Metabolic Panel:  Lab 58/30/94 0500 03/26/12 0948  NA 142 142  K 3.9 3.6  CL 105 104  CO2 29 30  GLUCOSE 124* 90  BUN 11 11  CREATININE 0.69 0.70  CALCIUM 9.3 9.0  MG -- --  PHOS -- --    Liver Function Tests:  Lab 03/26/12 2031  AST 16  ALT 35  ALKPHOS 92  BILITOT 0.3  PROT 6.6  ALBUMIN 2.7*    Lab 03/28/12 0939 03/26/12 2031  LIPASE 30 22  AMYLASE -- --   No results found for this basename: AMMONIA:5 in the last 168 hours  CBC:  Lab 03/30/12 0505 03/29/12 2149 03/29/12 1041 03/29/12 0932 03/27/12 0500  WBC 6.0 4.5 7.1 7.3 5.3  NEUTROABS -- -- -- -- --  HGB 10.0* 11.0* 11.0* 11.1* 11.9*  HCT 30.5* 32.7* 33.0* 34.0* 35.1*  MCV 86.2 86.1 86.2 86.7 85.6  PLT 413* 420* 436* 448* 400    Cardiac Enzymes: No results found for this basename: CKTOTAL:5,CKMB:5,CKMBINDEX:5,TROPONINI:5 in the last 168 hours BNP (last 3 results) No results found for this basename: PROBNP:3 in the last 8760 hours   CBG: No results found for this basename: GLUCAP:5 in the last 168 hours  Recent Results (from the past 240 hour(s))  CLOSTRIDIUM DIFFICILE BY PCR     Status: Normal   Collection Time   03/26/12  5:02 PM      Component Value Range Status Comment   C difficile by pcr NEGATIVE  NEGATIVE Final   URINE CULTURE     Status: Normal   Collection Time   03/27/12 10:17 PM      Component Value Range Status Comment   Specimen Description URINE, CLEAN CATCH   Final    Special Requests CX  ADDED AT 2241   Final    Culture  Setup Time 03/28/2012 04:29   Final    Colony Count NO GROWTH   Final    Culture NO GROWTH   Final    Report Status 03/29/2012 FINAL   Final   STOOL CULTURE     Status: Normal (Preliminary result)   Collection Time   03/28/12 10:51 PM      Component Value Range Status Comment   Specimen Description STOOL   Final    Special Requests Normal   Final    Culture Culture reincubated for better growth   Final    Report Status PENDING   Incomplete   OVA AND PARASITE EXAMINATION     Status: Normal   Collection Time  03/28/12 10:52 PM      Component Value Range Status Comment   Specimen Description STOOL   Final    Special Requests Normal   Final    Ova and parasites     Final    Value: ABUNDANT WBC ABUNDANT RBC'S NO OVA OR PARASITES SEEN   Report Status 03/29/2012 FINAL   Final      Studies: US Abdomen Complete  03/28/2012  *RADIOLOGY REPORT*  Clinical Data:  Gallstones  ABDOMINAL ULTRASOUND COMPLETE  Comparison:  03/26/2012  Findings:  Gallbladder:  Multiple stones are noted within the lumen of the gallbladder.  These measure up to 1.6 cm.  No wall thickening or pericholecystic fluid.  Negative sonographic Murphy's sign.  Common Bile Duct:  Within normal limits in caliber.  Liver: No focal mass lesion identified.  Within normal limits in parenchymal echogenicity.  IVC:  Appears normal.  Pancreas:  No abnormality identified.  Spleen:  Within normal limits in size and echotexture.  Right kidney:  Normal in size and parenchymal echogenicity.  No evidence of mass or hydronephrosis.  Left kidney:  Normal in size and parenchymal echogenicity.  No evidence of mass or hydronephrosis.  Abdominal Aorta:  No aneurysm identified.  IMPRESSION:  1.  Gallstones.   Original Report Authenticated By: Angelita Ingles, M.D.    Ct Abdomen Pelvis W Contrast  03/26/2012  *RADIOLOGY REPORT*  Clinical Data: 56 year old female with abdominal and pelvic pain and rectal bleeding.  CT  ABDOMEN AND PELVIS WITH CONTRAST  Technique:  Multidetector CT imaging of the abdomen and pelvis was performed following the standard protocol during bolus administration of intravenous contrast.  Contrast: 171m OMNIPAQUE IOHEXOL 300 MG/ML  SOLN  Comparison: 02/07/2010 CT  Findings: Circumferential wall thickening of the descending colon with mild adjacent inflammation is noted and compatible with colitis.  Mildly enlarged left mesenteric lymph nodes are again noted.  The visualized portions of the celiac artery, SMA, IMA and SMV are patent.  There is no evidence of bowel obstruction or pneumoperitoneum.  The liver, spleen, pancreas, adrenal glands and kidneys are unremarkable. Cholelithiasis identified without CT evidence of cholecystitis. The appendix and bladder are unremarkable.  No free fluid, biliary dilation or abdominal aortic aneurysm identified.  No acute or suspicious bony abnormalities are noted.  IMPRESSION: Colitis of the descending colon - question inflammatory or infectious.  Although this could represent ischemic colitis given distribution, the visualized mesenteric vessels are patent.  Cholelithiasis without CT evidence of acute cholecystitis.   Original Report Authenticated By: JLura Em M.D.     Scheduled Meds:   . budesonide-formoterol  2 puff Inhalation BID  . mesalamine  1,200 mg Oral Q breakfast  . methylPREDNISolone (SOLU-MEDROL) injection  60 mg Intravenous Daily  . montelukast  10 mg Oral QHS  . DISCONTD: saccharomyces boulardii  250 mg Oral BID   Continuous Infusions:   . sodium chloride 50 mL/hr at 03/30/12 0649  . sodium chloride    . DISCONTD: sodium chloride 500 mL (03/29/12 1305)    Principal Problem:  *Colitis Active Problems:  ASTHMA  ULCERATIVE COLITIS-LEFT SIDE  Anemia    Time spent: 40 minutes   [facility] Pager [phone] If 8PM-8AM, please contact night-coverage at [email or web address], password [email or web address]10/31/2013, 8:14 AM   LOS: 4 days

## 2012-03-30 NOTE — Progress Notes (Signed)
TB test was placed on the anterior right forearm on 03/30/12 at 16:30.

## 2012-03-31 ENCOUNTER — Encounter (HOSPITAL_COMMUNITY): Payer: Self-pay

## 2012-03-31 LAB — GI PATHOGEN PANEL BY PCR, STOOL
C difficile toxin A/B: NEGATIVE
Campylobacter by PCR: NEGATIVE
Cryptosporidium by PCR: NEGATIVE
E coli (ETEC) LT/ST: NEGATIVE
E coli (STEC): NEGATIVE
E coli 0157 by PCR: NEGATIVE
G lamblia by PCR: NEGATIVE
Norovirus GI/GII: NEGATIVE
Rotavirus A by PCR: NEGATIVE
Salmonella by PCR: NEGATIVE
Shigella by PCR: NEGATIVE

## 2012-03-31 LAB — HEPATITIS B SURFACE ANTIGEN: Hepatitis B Surface Ag: NEGATIVE

## 2012-03-31 NOTE — Progress Notes (Signed)
     [facility] Daily Rounding Note 03/31/2012, 10:03 AM  SUBJECTIVE:       Left sided pain is better.  Still about 11 or so smaller to moderate volume liquid bloody stoolson 10/31.  Feels a little better but still kind of worn out.  OBJECTIVE:         Vital signs in last 24 hours:    Temp:  [98.3 F (36.8 C)-98.7 F (37.1 C)] 98.3 F (36.8 C) (11/01 0556) Pulse Rate:  [75-90] 84  (11/01 0556) Resp:  [16-18] 18  (11/01 0556) BP: (104-142)/(66-75) 116/67 mmHg (11/01 0556) SpO2:  [97 %-100 %] 99 % (11/01 0556) Last BM Date: 03/30/12 General: less anxious   Heart: rrr Chest: clear.  No SOB Abdomen: soft, NT, ND.  No HSM no mass  Extremities: no pedal edema Neuro/Psych:  Pleasant, not somnolent or confused   Lab Results:  Basename 03/30/12 0505 03/29/12 2149 03/29/12 1041  WBC 6.0 4.5 7.1  HGB 10.0* 11.0* 11.0*  HCT 30.5* 32.7* 33.0*  PLT 413* 420* 436*   Hepatitis Panel  Basename 03/30/12 1411  HEPBSAG NEGATIVE  HCVAB --  HEPAIGM --  HEPBIGM --   Stool culture negative to date, reduced normal flora present,  Final reading still pending.  REPORT OF SURGICAL PATHOLOGY FINAL DIAGNOSIS Diagnosis Colon, biopsy, Rectal - ULCERATED, SEVERELY ACTIVE CHRONIC COLITIS, SEE COMMENT. - NEGATIVE FOR DYSPLASIA. Microscopic Comment The history of ulcerative colitis and recent sigmoidoscopy findings are both noted. Given the absence of diverticular disease, diverticular-disease associated colitis is essentially excluded and the histopathologic features are most consistent with idiopathic inflammatory bowel disease. Given the clinical history, the biopsy findings are that of chronic ulcerative colitis with rectal sparing likely secondary to a therapy effect. CMV immunostain is pending and will be reported in an addendum. (CRR:eps 03/30/12)  ASSESMENT: * Severe left sided colitis. Known UC, endoscopic appearance now more c/w Crohn's.  Biopsies c/w idiopathic IBD. On IV Solumedrol,  Lialda.  Bloody stools persist, improved left abd pain with addition of Bentyl.   C Diff -, Fecal Lactoferrin +, abundant stool WBCs but no O & P. WBC count is normal, has not been elevated at all.  Got one dose Rocephin, one dose Metronidazole day one. Florastor d/cd 10/30. *  Normocytic anemia   PLAN: *  Follow the PPD, placed 10/31 PM to right forearm.  *  Will advance to full liquid diet as tolerated.    LOS: 5 days   Azucena Freed  03/31/2012, 10:03 AM Pager: [phone]   I have taken an interval history, reviewed the chart and examined the patient. I agree with Billie Lade note, impression and recommendations. Awaiting CMV stain. May need Remicade if she fails to improve. Awaiting PPD results. HBsAg was negative.  Pricilla Riffle. Fuller Plan MD Marval Regal

## 2012-03-31 NOTE — Progress Notes (Signed)
[facility] PROGRESS NOTE  Brianna Santana LGX:[id number] DOB: 05/27/1956 DOA: 03/26/2012 PCP: [name], MD  Assessment/Plan: Principal Problem:  *Colitis Active Problems:  ASTHMA  ULCERATIVE COLITIS-LEFT SIDE  Anemia    Inflammatory bowel disease, GI managing, Will need infliximab, or another biologic, if she fails to improve on IV corticosteroids. Would stay on clear liquids only for now.Ordered stool pathogen PCR test which tests for multiple pathogens including E. Coli subtypes, Shigella, Campy, C diff....  * Added Bentyl, TID    Recurrent hematochezia monitor CBC closely 1.  2) UC/Left sided colitis on CT;as above  3) Anemia: secondary to ulcerative colitis, hemoglobin trending down slowly  4) Internal hemorrhoids.  5) GERD-will add a PPI. Marland Kitchen  6) Cholelithiasis on CT.  7) Gas and bloating: add Florastor BID PO.  8) Asthma on Proventil and Symbicort    Code Status: full  Family Communication: family updated about patient's clinical progress  Disposition Plan: As above  Brief narrative:  56 y.o. female.  HPI: Patient gives a history of ulcerative colitis being diagnosed about 20 years ago, by Dr. Everlean Cherry, followed over the last few years by Dr. Fuller Plan. She last saw Dr. Fuller Plan on 03/15/12 when he felt she was having a mild flare and prescribed Canasa suppositories. The patient's symptoms worsened prompting her to go to her PCP who put her on Prednisone and Hydrocodone for pain. She however did not improve with these medications and as her symptoms gradually worsened she came to the ER today. She complains of epigastric pain and lower abdominal pain with cramping, She last had a colonoscopy done on 09/15/10 when multiple biopsies were done from the left, transverse and right colon but only the biopsies from the left colon revealed mild active colitis; the rest of the biopsies revealed normal colonic mucosa. There was no evidence of dysplasia. She denies any skin rashes,  joint pains or ocular symptoms. She has been very nauseated but has not vomited. She has occasional reflux with spicy, greasy foods. There are no associated fever, chills or rigors. She has had C. Difficile colitis in 2011 and again in 2012.  Consultants:  Juanita Craver, MD  Procedures:  None  Antibiotics:  Zosyn has been discontinued  HPI/Subjective:  Still has some bright blood per rectum this morning  Objective: Filed Vitals:   03/30/12 1353 03/30/12 2134 03/30/12 2153 03/31/12 0556  BP: 104/66  142/75 116/67  Pulse: 90 90 75 84  Temp: 98.7 F (37.1 C)  98.4 F (36.9 C) 98.3 F (36.8 C)  TempSrc:      Resp: 16 16 18 18   Height:      Weight:      SpO2: 97% 97% 100% 99%    Intake/Output Summary (Last 24 hours) at 03/31/12 0901 Last data filed at 03/31/12 8144  Gross per 24 hour  Intake    600 ml  Output      0 ml  Net    600 ml    Exam:  General: not toxic looking, but beginning to look more tired and worn-out.  Heart: RRR.  Chest: clear. Unlabored breathing  Abdomen: soft, tender on left, slightly better than past days  Extremities: no pedal edema.  Neuro/Psych: Pleasant, less anxious. Very alert    Data Reviewed: Basic Metabolic Panel:  Lab 81/85/63 0500 03/26/12 0948  NA 142 142  K 3.9 3.6  CL 105 104  CO2 29 30  GLUCOSE 124* 90  BUN 11 11  CREATININE 0.69 0.70  CALCIUM 9.3 9.0  MG -- --  PHOS -- --    Liver Function Tests:  Lab 03/26/12 2031  AST 16  ALT 35  ALKPHOS 92  BILITOT 0.3  PROT 6.6  ALBUMIN 2.7*    Lab 03/28/12 0939 03/26/12 2031  LIPASE 30 22  AMYLASE -- --   No results found for this basename: AMMONIA:5 in the last 168 hours  CBC:  Lab 03/30/12 0505 03/29/12 2149 03/29/12 1041 03/29/12 0932 03/27/12 0500  WBC 6.0 4.5 7.1 7.3 5.3  NEUTROABS -- -- -- -- --  HGB 10.0* 11.0* 11.0* 11.1* 11.9*  HCT 30.5* 32.7* 33.0* 34.0* 35.1*  MCV 86.2 86.1 86.2 86.7 85.6  PLT 413* 420* 436* 448* 400    Cardiac Enzymes: No results  found for this basename: CKTOTAL:5,CKMB:5,CKMBINDEX:5,TROPONINI:5 in the last 168 hours BNP (last 3 results) No results found for this basename: PROBNP:3 in the last 8760 hours   CBG: No results found for this basename: GLUCAP:5 in the last 168 hours  Recent Results (from the past 240 hour(s))  CLOSTRIDIUM DIFFICILE BY PCR     Status: Normal   Collection Time   03/26/12  5:02 PM      Component Value Range Status Comment   C difficile by pcr NEGATIVE  NEGATIVE Final   URINE CULTURE     Status: Normal   Collection Time   03/27/12 10:17 PM      Component Value Range Status Comment   Specimen Description URINE, CLEAN CATCH   Final    Special Requests CX ADDED AT 2241   Final    Culture  Setup Time 03/28/2012 04:29   Final    Colony Count NO GROWTH   Final    Culture NO GROWTH   Final    Report Status 03/29/2012 FINAL   Final   STOOL CULTURE     Status: Normal (Preliminary result)   Collection Time   03/28/12 10:51 PM      Component Value Range Status Comment   Specimen Description STOOL   Final    Special Requests Normal   Final    Culture     Final    Value: NO SUSPICIOUS COLONIES, CONTINUING TO HOLD     Note: REDUCED NORMAL FLORA PRESENT   Report Status PENDING   Incomplete   OVA AND PARASITE EXAMINATION     Status: Normal   Collection Time   03/28/12 10:52 PM      Component Value Range Status Comment   Specimen Description STOOL   Final    Special Requests Normal   Final    Ova and parasites     Final    Value: ABUNDANT WBC ABUNDANT RBC'S NO OVA OR PARASITES SEEN   Report Status 03/29/2012 FINAL   Final      Studies: US Abdomen Complete  03/28/2012  *RADIOLOGY REPORT*  Clinical Data:  Gallstones  ABDOMINAL ULTRASOUND COMPLETE  Comparison:  03/26/2012  Findings:  Gallbladder:  Multiple stones are noted within the lumen of the gallbladder.  These measure up to 1.6 cm.  No wall thickening or pericholecystic fluid.  Negative sonographic Murphy's sign.  Common Bile Duct:   Within normal limits in caliber.  Liver: No focal mass lesion identified.  Within normal limits in parenchymal echogenicity.  IVC:  Appears normal.  Pancreas:  No abnormality identified.  Spleen:  Within normal limits in size and echotexture.  Right kidney:  Normal in size and parenchymal echogenicity.  No evidence  of mass or hydronephrosis.  Left kidney:  Normal in size and parenchymal echogenicity.  No evidence of mass or hydronephrosis.  Abdominal Aorta:  No aneurysm identified.  IMPRESSION:  1.  Gallstones.   Original Report Authenticated By: Angelita Ingles, M.D.    Ct Abdomen Pelvis W Contrast  03/26/2012  *RADIOLOGY REPORT*  Clinical Data: 56 year old female with abdominal and pelvic pain and rectal bleeding.  CT ABDOMEN AND PELVIS WITH CONTRAST  Technique:  Multidetector CT imaging of the abdomen and pelvis was performed following the standard protocol during bolus administration of intravenous contrast.  Contrast: 143m OMNIPAQUE IOHEXOL 300 MG/ML  SOLN  Comparison: 02/07/2010 CT  Findings: Circumferential wall thickening of the descending colon with mild adjacent inflammation is noted and compatible with colitis.  Mildly enlarged left mesenteric lymph nodes are again noted.  The visualized portions of the celiac artery, SMA, IMA and SMV are patent.  There is no evidence of bowel obstruction or pneumoperitoneum.  The liver, spleen, pancreas, adrenal glands and kidneys are unremarkable. Cholelithiasis identified without CT evidence of cholecystitis. The appendix and bladder are unremarkable.  No free fluid, biliary dilation or abdominal aortic aneurysm identified.  No acute or suspicious bony abnormalities are noted.  IMPRESSION: Colitis of the descending colon - question inflammatory or infectious.  Although this could represent ischemic colitis given distribution, the visualized mesenteric vessels are patent.  Cholelithiasis without CT evidence of acute cholecystitis.   Original Report Authenticated  By: JLura Em M.D.     Scheduled Meds:   . budesonide-formoterol  2 puff Inhalation BID  . dicyclomine  10 mg Oral TID AC  . mesalamine  1,200 mg Oral Q breakfast  . methylPREDNISolone (SOLU-MEDROL) injection  60 mg Intravenous Daily  . montelukast  10 mg Oral QHS  . tuberculin  5 Units Intradermal Once   Continuous Infusions:   . sodium chloride    . DISCONTD: sodium chloride 50 mL/hr at 03/31/12 0051    Principal Problem:  *Colitis Active Problems:  ASTHMA  ULCERATIVE COLITIS-LEFT SIDE  Anemia    Time spent: 40 minutes   [facility] Pager [phone] If 8PM-8AM, please contact night-coverage at [email or web address], password T[facility]11/06/2011, 9:01 AM  LOS: 5 days

## 2012-04-01 LAB — CBC
HCT: 30 % — ABNORMAL LOW (ref 36.0–46.0)
Hemoglobin: 9.9 g/dL — ABNORMAL LOW (ref 12.0–15.0)
MCH: 28.4 pg (ref 26.0–34.0)
MCHC: 33 g/dL (ref 30.0–36.0)
MCV: 86.2 fL (ref 78.0–100.0)
Platelets: 423 10*3/uL — ABNORMAL HIGH (ref 150–400)
RBC: 3.48 MIL/uL — ABNORMAL LOW (ref 3.87–5.11)
RDW: 13.2 % (ref 11.5–15.5)
WBC: 7.6 10*3/uL (ref 4.0–10.5)

## 2012-04-01 LAB — BASIC METABOLIC PANEL
BUN: 5 mg/dL — ABNORMAL LOW (ref 6–23)
CO2: 29 mEq/L (ref 19–32)
Calcium: 8.9 mg/dL (ref 8.4–10.5)
Chloride: 106 mEq/L (ref 96–112)
Creatinine, Ser: 0.73 mg/dL (ref 0.50–1.10)
GFR calc Af Amer: >90 mL/min (ref >90)
GFR calc non Af Amer: >90 mL/min (ref >90)
Glucose, Bld: 79 mg/dL (ref 70–99)
Potassium: 3.9 mEq/L (ref 3.5–5.1)
Sodium: 144 mEq/L (ref 135–145)

## 2012-04-01 LAB — STOOL CULTURE: Special Requests: NORMAL

## 2012-04-01 MED ORDER — METHYLPREDNISOLONE SODIUM SUCC 40 MG IJ SOLR
40.0000 mg | Freq: Two times a day (BID) | INTRAMUSCULAR | Status: DC
  Administered 2012-04-01 – 2012-04-03 (×5): 40 mg via INTRAVENOUS
  Filled 2012-04-01 (×7): qty 1

## 2012-04-01 MED ORDER — MESALAMINE 1.2 G PO TBEC
4.8000 g | DELAYED_RELEASE_TABLET | Freq: Every day | ORAL | Status: DC
  Administered 2012-04-02 – 2012-04-04 (×3): 4.8 g via ORAL
  Filled 2012-04-01 (×5): qty 4

## 2012-04-01 NOTE — Progress Notes (Signed)
[facility] PROGRESS NOTE  Brianna Santana GYJ:[id number] DOB: September 13, 1955 DOA: 03/26/2012 PCP: [name], MD  Assessment/Plan: Principal Problem:  *Colitis Active Problems:  ASTHMA  ULCERATIVE COLITIS-LEFT SIDE  Anemia    Inflammatory bowel disease, GI managing, more consistent with Crohn's disease, biopsies consistent with idiopathic IBD. As bloody diarrhea persists, Will need infliximab, or another biologic, if she fails to improve on IV corticosteroids. PPD placed yesterday  Would stay on clear liquids only for now.Ordered stool pathogen PCR test which tests for multiple pathogens including E. Coli subtypes, Shigella, Campy, C diff....  Added Bentyl, TID  Recurrent hematochezia monitor CBC closely   2) UC/Left sided colitis on CT;as above  3) Anemia: secondary to ulcerative colitis, hemoglobin trending down slowly  4) Internal hemorrhoids.  5) GERD-will add a PPI. Marland Kitchen  6) Cholelithiasis on CT.  7) Gas and bloating: add Florastor BID PO.  8) Asthma on Proventil and Symbicort   Code Status: full  Family Communication: family updated about patient's clinical progress  Disposition Plan: Per gastroenterology   Brief narrative:  56 y.o. female.  HPI: Patient gives a history of ulcerative colitis being diagnosed about 20 years ago, by Dr. Everlean Cherry, followed over the last few years by Dr. Fuller Plan. She last saw Dr. Fuller Plan on 03/15/12 when he felt she was having a mild flare and prescribed Canasa suppositories. The patient's symptoms worsened prompting her to go to her PCP who put her on Prednisone and Hydrocodone for pain. She however did not improve with these medications and as her symptoms gradually worsened she came to the ER today. She complains of epigastric pain and lower abdominal pain with cramping, She last had a colonoscopy done on 09/15/10 when multiple biopsies were done from the left, transverse and right colon but only the biopsies from the left colon revealed mild  active colitis; the rest of the biopsies revealed normal colonic mucosa. There was no evidence of dysplasia. She denies any skin rashes, joint pains or ocular symptoms. She has been very nauseated but has not vomited. She has occasional reflux with spicy, greasy foods. There are no associated fever, chills or rigors. She has had C. Difficile colitis in 2011 and again in 2012.  Consultants:  Juanita Craver, MD  Procedures:  None  Antibiotics:  Zosyn has been discontinued  HPI/Subjective:  Still has some bright blood per rectum this morning  Objective: Filed Vitals:   03/31/12 1400 03/31/12 2138 03/31/12 2206 04/01/12 0657  BP: 108/51 113/69  123/70  Pulse: 73 79  84  Temp: 97.4 F (36.3 C) 98.5 F (36.9 C)  98.6 F (37 C)  TempSrc:  Oral  Oral  Resp: 16 16  16   Height:      Weight:      SpO2: 93% 99% 94% 97%    Intake/Output Summary (Last 24 hours) at 04/01/12 0841 Last data filed at 03/31/12 1200  Gross per 24 hour  Intake    480 ml  Output      0 ml  Net    480 ml    Exam:  General: less anxious  Heart: rrr  Chest: clear. No SOB  Abdomen: soft, NT, ND. No HSM no mass  Extremities: no pedal edema  Neuro/Psych: Pleasant, not somnolent or confused    Data Reviewed: Basic Metabolic Panel:  Lab 26/37/85 0430 03/27/12 0500 03/26/12 0948  NA 144 142 142  K 3.9 3.9 3.6  CL 106 105 104  CO2 29 29 30   GLUCOSE  79 124* 90  BUN 5* 11 11  CREATININE 0.73 0.69 0.70  CALCIUM 8.9 9.3 9.0  MG -- -- --  PHOS -- -- --    Liver Function Tests:  Lab 03/26/12 2031  AST 16  ALT 35  ALKPHOS 92  BILITOT 0.3  PROT 6.6  ALBUMIN 2.7*    Lab 03/28/12 0939 03/26/12 2031  LIPASE 30 22  AMYLASE -- --   No results found for this basename: AMMONIA:5 in the last 168 hours  CBC:  Lab 04/01/12 0430 03/30/12 0505 03/29/12 2149 03/29/12 1041 03/29/12 0932  WBC 7.6 6.0 4.5 7.1 7.3  NEUTROABS -- -- -- -- --  HGB 9.9* 10.0* 11.0* 11.0* 11.1*  HCT 30.0* 30.5* 32.7* 33.0* 34.0*    MCV 86.2 86.2 86.1 86.2 86.7  PLT 423* 413* 420* 436* 448*    Cardiac Enzymes: No results found for this basename: CKTOTAL:5,CKMB:5,CKMBINDEX:5,TROPONINI:5 in the last 168 hours BNP (last 3 results) No results found for this basename: PROBNP:3 in the last 8760 hours   CBG: No results found for this basename: GLUCAP:5 in the last 168 hours  Recent Results (from the past 240 hour(s))  CLOSTRIDIUM DIFFICILE BY PCR     Status: Normal   Collection Time   03/26/12  5:02 PM      Component Value Range Status Comment   C difficile by pcr NEGATIVE  NEGATIVE Final   URINE CULTURE     Status: Normal   Collection Time   03/27/12 10:17 PM      Component Value Range Status Comment   Specimen Description URINE, CLEAN CATCH   Final    Special Requests CX ADDED AT 2241   Final    Culture  Setup Time 03/28/2012 04:29   Final    Colony Count NO GROWTH   Final    Culture NO GROWTH   Final    Report Status 03/29/2012 FINAL   Final   STOOL CULTURE     Status: Normal (Preliminary result)   Collection Time   03/28/12 10:51 PM      Component Value Range Status Comment   Specimen Description STOOL   Final    Special Requests Normal   Final    Culture     Final    Value: NO SUSPICIOUS COLONIES, CONTINUING TO HOLD     Note: REDUCED NORMAL FLORA PRESENT   Report Status PENDING   Incomplete   OVA AND PARASITE EXAMINATION     Status: Normal   Collection Time   03/28/12 10:52 PM      Component Value Range Status Comment   Specimen Description STOOL   Final    Special Requests Normal   Final    Ova and parasites     Final    Value: ABUNDANT WBC ABUNDANT RBC'S NO OVA OR PARASITES SEEN   Report Status 03/29/2012 FINAL   Final      Studies: US Abdomen Complete  03/28/2012  *RADIOLOGY REPORT*  Clinical Data:  Gallstones  ABDOMINAL ULTRASOUND COMPLETE  Comparison:  03/26/2012  Findings:  Gallbladder:  Multiple stones are noted within the lumen of the gallbladder.  These measure up to 1.6 cm.  No wall  thickening or pericholecystic fluid.  Negative sonographic Murphy's sign.  Common Bile Duct:  Within normal limits in caliber.  Liver: No focal mass lesion identified.  Within normal limits in parenchymal echogenicity.  IVC:  Appears normal.  Pancreas:  No abnormality identified.  Spleen:  Within normal  limits in size and echotexture.  Right kidney:  Normal in size and parenchymal echogenicity.  No evidence of mass or hydronephrosis.  Left kidney:  Normal in size and parenchymal echogenicity.  No evidence of mass or hydronephrosis.  Abdominal Aorta:  No aneurysm identified.  IMPRESSION:  1.  Gallstones.   Original Report Authenticated By: Angelita Ingles, M.D.    Ct Abdomen Pelvis W Contrast  03/26/2012  *RADIOLOGY REPORT*  Clinical Data: 56 year old female with abdominal and pelvic pain and rectal bleeding.  CT ABDOMEN AND PELVIS WITH CONTRAST  Technique:  Multidetector CT imaging of the abdomen and pelvis was performed following the standard protocol during bolus administration of intravenous contrast.  Contrast: 175m OMNIPAQUE IOHEXOL 300 MG/ML  SOLN  Comparison: 02/07/2010 CT  Findings: Circumferential wall thickening of the descending colon with mild adjacent inflammation is noted and compatible with colitis.  Mildly enlarged left mesenteric lymph nodes are again noted.  The visualized portions of the celiac artery, SMA, IMA and SMV are patent.  There is no evidence of bowel obstruction or pneumoperitoneum.  The liver, spleen, pancreas, adrenal glands and kidneys are unremarkable. Cholelithiasis identified without CT evidence of cholecystitis. The appendix and bladder are unremarkable.  No free fluid, biliary dilation or abdominal aortic aneurysm identified.  No acute or suspicious bony abnormalities are noted.  IMPRESSION: Colitis of the descending colon - question inflammatory or infectious.  Although this could represent ischemic colitis given distribution, the visualized mesenteric vessels are patent.   Cholelithiasis without CT evidence of acute cholecystitis.   Original Report Authenticated By: JLura Em M.D.     Scheduled Meds:   . budesonide-formoterol  2 puff Inhalation BID  . dicyclomine  10 mg Oral TID AC  . mesalamine  1,200 mg Oral Q breakfast  . methylPREDNISolone (SOLU-MEDROL) injection  60 mg Intravenous Daily  . montelukast  10 mg Oral QHS   Continuous Infusions:   . sodium chloride 75 mL/hr at 03/31/12 1514  . DISCONTD: sodium chloride 50 mL/hr at 03/31/12 0051    Principal Problem:  *Colitis Active Problems:  ASTHMA  ULCERATIVE COLITIS-LEFT SIDE  Anemia    Time spent: 40 minutes   [facility] Pager [phone] If 8PM-8AM, please contact night-coverage at [email or web address], password [facility]11/07/2011, 8:41 AM  LOS: 6 days

## 2012-04-01 NOTE — Progress Notes (Addendum)
Patient ID: Brianna Santana, female   DOB: [birth year], 56 y.o.   MRN: [id number]  [facility] Progress Note  Subjective: Feels about the same, still cramping and pain in lower abdomen, having 10-11 bloody bm's per day. Tolerating full liquids and not really hungry  TB skin test is negative  Objective:  Vital signs in last 24 hours: Temp:  [97.4 F (36.3 C)-98.6 F (37 C)] 98.6 F (37 C) (11/02 0657) Pulse Rate:  [73-84] 84  (11/02 0657) Resp:  [16] 16  (11/02 0657) BP: (108-123)/(51-70) 123/70 mmHg (11/02 0657) SpO2:  [93 %-100 %] 100 % (11/02 0917) Last BM Date: 03/31/12 General:   Alert, well-developed, in NAD Heart:  Regular rate and rhythm; no murmurs Pulm;clear Abdomen:  Soft, tender LLQ and LMQ, and nondistended. Normal bowel sounds, without guarding, and without rebound.   Extremities:  Without edema. Neurologic:  Alert and  oriented x4;  grossly normal neurologically. Psych:  Alert and cooperative. Normal mood and affect.  Intake/Output from previous day: 11/01 0701 - 11/02 0700 In: 1320 [P.O.:1320] Out: -  Intake/Output this shift:    Lab Results:  Basename 04/01/12 0430 03/30/12 0505 03/29/12 2149  WBC 7.6 6.0 4.5  HGB 9.9* 10.0* 11.0*  HCT 30.0* 30.5* 32.7*  PLT 423* 413* 420*   BMET  Basename 04/01/12 0430  NA 144  K 3.9  CL 106  CO2 29  GLUCOSE 79  BUN 5*  CREATININE 0.73  CALCIUM 8.9   Hepatitis Panel  Basename 03/30/12 1411  HEPBSAG NEGATIVE  HCVAB --  HEPAIGM --  HEPBIGM --   Assessment / Plan: 1. Severe left sided colitis, ? Crohns vs UC- Day # 6 IV steroids with little progress. Increase Solumedrol to 40 mg IV q12h. Continue Bentyl and Lialda (was only on 1.2 gm once daily-increased it to 4.8 gm daily) Await final colon bx's and CMV - if negative and she fails to improve on Solumedrol will need Remicade initiated in hospital. TB test and Hepatitis serology negative  2. Anemia -secondary to above -stable  Principal  Problem:  *Colitis Active Problems:  ASTHMA  ULCERATIVE COLITIS-LEFT SIDE  Anemia   LOS: 6 days   Amy Esterwood  04/01/2012, 12:10 PM   I have taken an interval history, reviewed the chart and examined the patient. I agree with the extender's note, impression and recommendations. Increase Lialda to 4.8g daily and Solumedrol to 40 mg IV q12h. Still awaiting CMV stains.  Pricilla Riffle. Fuller Plan MD Marval Regal

## 2012-04-02 LAB — CBC
HCT: 31.3 % — ABNORMAL LOW (ref 36.0–46.0)
Hemoglobin: 10.2 g/dL — ABNORMAL LOW (ref 12.0–15.0)
MCH: 28 pg (ref 26.0–34.0)
MCHC: 32.6 g/dL (ref 30.0–36.0)
MCV: 86 fL (ref 78.0–100.0)
Platelets: 458 10*3/uL — ABNORMAL HIGH (ref 150–400)
RBC: 3.64 MIL/uL — ABNORMAL LOW (ref 3.87–5.11)
RDW: 13.3 % (ref 11.5–15.5)
WBC: 5.7 10*3/uL (ref 4.0–10.5)

## 2012-04-02 NOTE — Progress Notes (Addendum)
[facility] Progress Note  Subjective:  Diarrhea has decrease to 5 times with minimal blood over the past 24 hours. Stool more formed. Abd pain has improved.  Objective:  Vital signs in last 24 hours: Temp:  [97.9 F (36.6 C)-98.1 F (36.7 C)] 97.9 F (36.6 C) (11/03 0738) Pulse Rate:  [63-102] 102  (11/03 0738) Resp:  [18] 18  (11/03 0738) BP: (101-125)/(63-79) 125/79 mmHg (11/03 0738) SpO2:  [97 %-99 %] 99 % (11/03 0738) Last BM Date: 04/01/12 General:   Alert, well-developed, female in NAD Heart:  Regular rate and rhythm; no murmurs Abdomen:  Soft, minimal LUQ tenderness and nondistended. Normal bowel sounds, without guarding, and without rebound.   Extremities:  Without edema. Neurologic:  Alert and  oriented x4;  grossly normal neurologically. Psych:  Alert and cooperative. Normal mood and affect.  Intake/Output from previous day:   Intake/Output this shift:    Lab Results:  Basename 04/02/12 0600 04/01/12 0430  WBC 5.7 7.6  HGB 10.2* 9.9*  HCT 31.3* 30.0*  PLT 458* 423*   BMET  Basename 04/01/12 0430  NA 144  K 3.9  CL 106  CO2 29  GLUCOSE 79  BUN 5*  CREATININE 0.73  CALCIUM 8.9   Hepatitis Panel  Basename 03/30/12 1411  HEPBSAG NEGATIVE  HCVAB --  HEPAIGM --  HEPBIGM --    Assessment / Plan:  1. Severe left sided colitis, ? Crohns vs UC- Day # 7 IV steroids, has made some improvement since yesterday.  Continue Solumedrol to 40 mg IV q12h. Continue Bentyl and Lialda 4.8 gm daily. Continue current diet-will not advance today. Await final colon bx's for CMV - if negative and she fails to improve on Solumedrol will need Remicade initiated in hospital. TB test and Hepatitis serology were negative. If she improves with Solumedrol will need 6MP started. Will order TPMT enzyme activity in anticipation of 6MP therapy.   2. Anemia -secondary to above -stable   Principal Problem:  *Colitis Active Problems:  ASTHMA  ULCERATIVE  COLITIS-LEFT SIDE  Anemia   LOS: 7 days   Chares Slaymaker T. Fuller Plan  04/02/2012, 11:59 AM

## 2012-04-02 NOTE — Progress Notes (Signed)
[facility] PROGRESS NOTE  Brianna Santana ZOX:[id number] DOB: 1956-02-11 DOA: 03/26/2012 PCP: [name], MD  Assessment/Plan: Principal Problem:  *Colitis Active Problems:  ASTHMA  ULCERATIVE COLITIS-LEFT SIDE  Anemia    Inflammatory bowel disease, GI managing, status post colonoscopy, initial findings more consistent with Crohn's disease, biopsies consistent with idiopathic IBD. As bloody diarrhea persists, Will need infliximab, or another biologic, if she fails to improve on IV corticosteroids.  PPD placed yesterday , results awaited Would stay on clear liquids only for now. Stool studies negative thus.far  Added Bentyl, TID  Recurrent hematochezia monitor CBC closely , hemoglobin has been stable so far  2) UC/Left sided colitis on CT;as above  3) Anemia: secondary to ulcerative colitis, hemoglobin trending down slowly but stable 4) Internal hemorrhoids.  5) GERD-will add a PPI. Marland Kitchen  6) Cholelithiasis on CT.  7) Gas and bloating: add Florastor BID PO.  8) Asthma on Proventil and Symbicort    Code Status: full  Family Communication: family updated about patient's clinical progress  Disposition Plan: Per gastroenterology    Brief narrative:  56 y.o. female.  HPI: Patient gives a history of ulcerative colitis being diagnosed about 20 years ago, by Dr. Everlean Cherry, followed over the last few years by Dr. Fuller Plan. She last saw Dr. Fuller Plan on 03/15/12 when he felt she was having a mild flare and prescribed Canasa suppositories. The patient's symptoms worsened prompting her to go to her PCP who put her on Prednisone and Hydrocodone for pain. She however did not improve with these medications and as her symptoms gradually worsened she came to the ER today. She complains of epigastric pain and lower abdominal pain with cramping, She last had a colonoscopy done on 09/15/10 when multiple biopsies were done from the left, transverse and right colon but only the biopsies from the left  colon revealed mild active colitis; the rest of the biopsies revealed normal colonic mucosa. There was no evidence of dysplasia. She denies any skin rashes, joint pains or ocular symptoms. She has been very nauseated but has not vomited. She has occasional reflux with spicy, greasy foods. There are no associated fever, chills or rigors. She has had C. Difficile colitis in 2011 and again in 2012.    Consultants:  Juanita Craver, MD  Procedures:  None  Antibiotics:  Zosyn has been discontinued  HPI/Subjective:  still cramping and pain in lower abdomen, having 10-11 bloody bm's per day. Tolerating full liquids and not really hungry   Objective: Filed Vitals:   04/01/12 0917 04/01/12 1416 04/01/12 2052 04/02/12 0738  BP:  112/73 101/63 125/79  Pulse:  79 63 102  Temp:  98 F (36.7 C) 98.1 F (36.7 C) 97.9 F (36.6 C)  TempSrc:  Oral Oral Oral  Resp:  18 18 18   Height:      Weight:      SpO2: 100% 97% 99% 99%   No intake or output data in the 24 hours ending 04/02/12 0939  Exam:  General: Alert, well-developed, in NAD  Heart: Regular rate and rhythm; no murmurs  Pulm;clear  Abdomen: Soft, tender LLQ and LMQ, and nondistended. Normal bowel sounds, without guarding, and without rebound.  Extremities: Without edema.  Neurologic: Alert and oriented x4; grossly normal neurologically.  Psych: Alert and cooperative. Normal mood and affect.    Data Reviewed: Basic Metabolic Panel:  Lab 81/19/14 0430 03/27/12 0500 03/26/12 0948  NA 144 142 142  K 3.9 3.9 3.6  CL 106 105 104  CO2 29 29 30   GLUCOSE 79 124* 90  BUN 5* 11 11  CREATININE 0.73 0.69 0.70  CALCIUM 8.9 9.3 9.0  MG -- -- --  PHOS -- -- --    Liver Function Tests:  Lab 03/26/12 2031  AST 16  ALT 35  ALKPHOS 92  BILITOT 0.3  PROT 6.6  ALBUMIN 2.7*    Lab 03/28/12 0939 03/26/12 2031  LIPASE 30 22  AMYLASE -- --   No results found for this basename: AMMONIA:5 in the last 168 hours  CBC:  Lab 04/02/12  0600 04/01/12 0430 03/30/12 0505 03/29/12 2149 03/29/12 1041  WBC 5.7 7.6 6.0 4.5 7.1  NEUTROABS -- -- -- -- --  HGB 10.2* 9.9* 10.0* 11.0* 11.0*  HCT 31.3* 30.0* 30.5* 32.7* 33.0*  MCV 86.0 86.2 86.2 86.1 86.2  PLT 458* 423* 413* 420* 436*    Cardiac Enzymes: No results found for this basename: CKTOTAL:5,CKMB:5,CKMBINDEX:5,TROPONINI:5 in the last 168 hours BNP (last 3 results) No results found for this basename: PROBNP:3 in the last 8760 hours   CBG: No results found for this basename: GLUCAP:5 in the last 168 hours  Recent Results (from the past 240 hour(s))  CLOSTRIDIUM DIFFICILE BY PCR     Status: Normal   Collection Time   03/26/12  5:02 PM      Component Value Range Status Comment   C difficile by pcr NEGATIVE  NEGATIVE Final   URINE CULTURE     Status: Normal   Collection Time   03/27/12 10:17 PM      Component Value Range Status Comment   Specimen Description URINE, CLEAN CATCH   Final    Special Requests CX ADDED AT 2241   Final    Culture  Setup Time 03/28/2012 04:29   Final    Colony Count NO GROWTH   Final    Culture NO GROWTH   Final    Report Status 03/29/2012 FINAL   Final   STOOL CULTURE     Status: Normal   Collection Time   03/28/12 10:51 PM      Component Value Range Status Comment   Specimen Description STOOL   Final    Special Requests Normal   Final    Culture     Final    Value: NO SALMONELLA, SHIGELLA, CAMPYLOBACTER, YERSINIA, OR E.COLI 0157:H7 ISOLATED     Note: REDUCED NORMAL FLORA PRESENT   Report Status 04/01/2012 FINAL   Final   OVA AND PARASITE EXAMINATION     Status: Normal   Collection Time   03/28/12 10:52 PM      Component Value Range Status Comment   Specimen Description STOOL   Final    Special Requests Normal   Final    Ova and parasites     Final    Value: ABUNDANT WBC ABUNDANT RBC'S NO OVA OR PARASITES SEEN   Report Status 03/29/2012 FINAL   Final      Studies: US Abdomen Complete  03/28/2012  *RADIOLOGY REPORT*   Clinical Data:  Gallstones  ABDOMINAL ULTRASOUND COMPLETE  Comparison:  03/26/2012  Findings:  Gallbladder:  Multiple stones are noted within the lumen of the gallbladder.  These measure up to 1.6 cm.  No wall thickening or pericholecystic fluid.  Negative sonographic Murphy's sign.  Common Bile Duct:  Within normal limits in caliber.  Liver: No focal mass lesion identified.  Within normal limits in parenchymal echogenicity.  IVC:  Appears normal.  Pancreas:  No  abnormality identified.  Spleen:  Within normal limits in size and echotexture.  Right kidney:  Normal in size and parenchymal echogenicity.  No evidence of mass or hydronephrosis.  Left kidney:  Normal in size and parenchymal echogenicity.  No evidence of mass or hydronephrosis.  Abdominal Aorta:  No aneurysm identified.  IMPRESSION:  1.  Gallstones.   Original Report Authenticated By: Angelita Ingles, M.D.    Ct Abdomen Pelvis W Contrast  03/26/2012  *RADIOLOGY REPORT*  Clinical Data: 56 year old female with abdominal and pelvic pain and rectal bleeding.  CT ABDOMEN AND PELVIS WITH CONTRAST  Technique:  Multidetector CT imaging of the abdomen and pelvis was performed following the standard protocol during bolus administration of intravenous contrast.  Contrast: 16m OMNIPAQUE IOHEXOL 300 MG/ML  SOLN  Comparison: 02/07/2010 CT  Findings: Circumferential wall thickening of the descending colon with mild adjacent inflammation is noted and compatible with colitis.  Mildly enlarged left mesenteric lymph nodes are again noted.  The visualized portions of the celiac artery, SMA, IMA and SMV are patent.  There is no evidence of bowel obstruction or pneumoperitoneum.  The liver, spleen, pancreas, adrenal glands and kidneys are unremarkable. Cholelithiasis identified without CT evidence of cholecystitis. The appendix and bladder are unremarkable.  No free fluid, biliary dilation or abdominal aortic aneurysm identified.  No acute or suspicious bony  abnormalities are noted.  IMPRESSION: Colitis of the descending colon - question inflammatory or infectious.  Although this could represent ischemic colitis given distribution, the visualized mesenteric vessels are patent.  Cholelithiasis without CT evidence of acute cholecystitis.   Original Report Authenticated By: JLura Em M.D.     Scheduled Meds:   . budesonide-formoterol  2 puff Inhalation BID  . dicyclomine  10 mg Oral TID AC  . mesalamine  4.8 g Oral Q breakfast  . methylPREDNISolone (SOLU-MEDROL) injection  40 mg Intravenous Q12H  . montelukast  10 mg Oral QHS  . [DISCONTINUED] mesalamine  1,200 mg Oral Q breakfast  . [DISCONTINUED] methylPREDNISolone (SOLU-MEDROL) injection  60 mg Intravenous Daily   Continuous Infusions:   . sodium chloride 75 mL/hr at 03/31/12 1514    Principal Problem:  *Colitis Active Problems:  ASTHMA  ULCERATIVE COLITIS-LEFT SIDE  Anemia    Time spent: 40 minutes   [facility] Pager [phone] If 8PM-8AM, please contact night-coverage at [email or web address], password T[facility]11/07/2011, 9:39 AM  LOS: 7 days

## 2012-04-03 DIAGNOSIS — A0472 Enterocolitis due to Clostridium difficile, not specified as recurrent: Secondary | ICD-10-CM

## 2012-04-03 LAB — CBC
HCT: 33.2 % — ABNORMAL LOW (ref 36.0–46.0)
Hemoglobin: 10.7 g/dL — ABNORMAL LOW (ref 12.0–15.0)
MCH: 27.9 pg (ref 26.0–34.0)
MCHC: 32.2 g/dL (ref 30.0–36.0)
MCV: 86.7 fL (ref 78.0–100.0)
Platelets: 511 10*3/uL — ABNORMAL HIGH (ref 150–400)
RBC: 3.83 MIL/uL — ABNORMAL LOW (ref 3.87–5.11)
RDW: 13.6 % (ref 11.5–15.5)
WBC: 5.6 10*3/uL (ref 4.0–10.5)

## 2012-04-03 NOTE — Progress Notes (Signed)
[facility]                                                                                Patient Demographics  Brianna Santana, is a 56 y.o. female  BUL:[id number]  HOZ:[id number]  DOB - 1955-08-21  Admit date - 03/26/2012  Admitting Physician [name], MD  Outpatient Primary MD for the patient is [name], MD  LOS - 8   Chief Complaint  Patient presents with  . Rectal Bleeding  . Abdominal Pain  . Nausea        Assessment & Plan    Principal Problem:  *Colitis Active Problems:  ASTHMA  ULCERATIVE COLITIS-LEFT SIDE  Anemia   Inflammatory bowel disease, GI managing, more consistent with Crohn's disease, biopsies consistent with idiopathic IBD. As bloody diarrhea persists, Will need infliximab, or another biologic, if she fails to improve on IV corticosteroids. PPD -ve so far, C. difficile PCR negative. Is on clear liquid diet along with IV Solu-Medrol, will have GI advanced diet and taper steroids as needed.    Anemia of chronic disease along with dilutional fall due to IV fluids monitor H&H currently no acute issues.    GERD-  PPI. Marland Kitchen      Cholelithiasis on CT patient followup with PCP and general surgery if needed    Asthma on Proventil and Symbicort, stable no acute issues   Family Communication: With the patient that side  Disposition Plan: Home    Procedures  abdominal ultrasound showing gallstones   Consults  GI   Time Spent in minutes  35   Antibiotics     Anti-infectives     Start     Dose/Rate Route Frequency Ordered Stop   03/26/12 1530   piperacillin-tazobactam (ZOSYN) IVPB 3.375 g  Status:  Discontinued        3.375 g 12.5 mL/hr over 240 Minutes Intravenous 3 times per day 03/26/12 1517 03/27/12 1004   03/26/12 1330   ciprofloxacin (CIPRO) IVPB 400 mg  Status:  Discontinued        400 mg 200 mL/hr over 60 Minutes Intravenous  Once 03/26/12 1326 03/26/12 1326   03/26/12 1330   metroNIDAZOLE  (FLAGYL) IVPB 500 mg        500 mg 100 mL/hr over 60 Minutes Intravenous  Once 03/26/12 1326 03/26/12 1616   03/26/12 1330   cefTRIAXone (ROCEPHIN) 1 g in dextrose 5 % 50 mL IVPB  Status:  Discontinued        1 g 100 mL/hr over 30 Minutes Intravenous Every 24 hours 03/26/12 1330 03/27/12 1004          Scheduled Meds:   . budesonide-formoterol  2 puff Inhalation BID  . dicyclomine  10 mg Oral TID AC  . mesalamine  4.8 g Oral Q breakfast  . methylPREDNISolone (SOLU-MEDROL) injection  40 mg Intravenous Q12H  . montelukast  10 mg Oral QHS   Continuous Infusions:   . sodium chloride 75 mL/hr at 03/31/12 1514   PRN Meds:.albuterol, HYDROcodone-acetaminophen, morphine injection, ondansetron (ZOFRAN) IV, ondansetron   DVT Prophylaxis  SCDs    Lab Results  Component Value Date   PLT 511* 04/03/2012  Thurnell Lose M.D on 04/03/2012 at 10:31 AM  Between 7am to 7pm - Pager - [phone]  After 7pm go to [email or web address] - password [email or web address]  And look for the night coverage person covering for me after hours  [facility] Office  [phone]    Subjective:   Chloeann Alfred today has, No headache, No chest pain, No abdominal pain - No Nausea, No new weakness tingling or numbness, No Cough - SOB. Mild diarrhea.  Objective:   Filed Vitals:   04/02/12 0738 04/02/12 1426 04/02/12 2042 04/03/12 0916  BP: 125/79 105/58 106/63   Pulse: 102 75 76   Temp: 97.9 F (36.6 C) 98.4 F (36.9 C) 98.3 F (36.8 C)   TempSrc: Oral Oral Oral   Resp: 18 18 18    Height:      Weight:      SpO2: 99% 98% 96% 98%    Wt Readings from Last 3 Encounters:  03/28/12 77.111 kg (170 lb)  03/28/12 77.111 kg (170 lb)  03/15/12 79.652 kg (175 lb 9.6 oz)     Intake/Output Summary (Last 24 hours) at 04/03/12 1031 Last data filed at 04/02/12 1330  Gross per 24 hour  Intake    240 ml  Output      0 ml  Net    240 ml    Exam Awake Alert, Oriented X 3, No new F.N deficits,  Normal affect [place].AT,PERRAL Supple Neck,No JVD, No cervical lymphadenopathy appriciated.  Symmetrical Chest wall movement, Good air movement bilaterally, CTAB RRR,No Gallops,Rubs or new Murmurs, No Parasternal Heave +ve B.Sounds, Abd Soft, Non tender, No organomegaly appriciated, No rebound - guarding or rigidity. No Cyanosis, Clubbing or edema, No new Rash or bruise      Data Review   Micro Results Recent Results (from the past 240 hour(s))  CLOSTRIDIUM DIFFICILE BY PCR     Status: Normal   Collection Time   03/26/12  5:02 PM      Component Value Range Status Comment   C difficile by pcr NEGATIVE  NEGATIVE Final   URINE CULTURE     Status: Normal   Collection Time   03/27/12 10:17 PM      Component Value Range Status Comment   Specimen Description URINE, CLEAN CATCH   Final    Special Requests CX ADDED AT 2241   Final    Culture  Setup Time 03/28/2012 04:29   Final    Colony Count NO GROWTH   Final    Culture NO GROWTH   Final    Report Status 03/29/2012 FINAL   Final   STOOL CULTURE     Status: Normal   Collection Time   03/28/12 10:51 PM      Component Value Range Status Comment   Specimen Description STOOL   Final    Special Requests Normal   Final    Culture     Final    Value: NO SALMONELLA, SHIGELLA, CAMPYLOBACTER, YERSINIA, OR E.COLI 0157:H7 ISOLATED     Note: REDUCED NORMAL FLORA PRESENT   Report Status 04/01/2012 FINAL   Final   OVA AND PARASITE EXAMINATION     Status: Normal   Collection Time   03/28/12 10:52 PM      Component Value Range Status Comment   Specimen Description STOOL   Final    Special Requests Normal   Final    Ova and parasites     Final    Value: ABUNDANT WBC ABUNDANT RBC'S NO OVA  OR PARASITES SEEN   Report Status 03/29/2012 FINAL   Final     Radiology Reports US Abdomen Complete  03/28/2012  *RADIOLOGY REPORT*  Clinical Data:  Gallstones  ABDOMINAL ULTRASOUND COMPLETE  Comparison:  03/26/2012  Findings:  Gallbladder:  Multiple stones  are noted within the lumen of the gallbladder.  These measure up to 1.6 cm.  No wall thickening or pericholecystic fluid.  Negative sonographic Murphy's sign.  Common Bile Duct:  Within normal limits in caliber.  Liver: No focal mass lesion identified.  Within normal limits in parenchymal echogenicity.  IVC:  Appears normal.  Pancreas:  No abnormality identified.  Spleen:  Within normal limits in size and echotexture.  Right kidney:  Normal in size and parenchymal echogenicity.  No evidence of mass or hydronephrosis.  Left kidney:  Normal in size and parenchymal echogenicity.  No evidence of mass or hydronephrosis.  Abdominal Aorta:  No aneurysm identified.  IMPRESSION:  1.  Gallstones.   Original Report Authenticated By: Angelita Ingles, M.D.    Ct Abdomen Pelvis W Contrast  03/26/2012  *RADIOLOGY REPORT*  Clinical Data: 56 year old female with abdominal and pelvic pain and rectal bleeding.  CT ABDOMEN AND PELVIS WITH CONTRAST  Technique:  Multidetector CT imaging of the abdomen and pelvis was performed following the standard protocol during bolus administration of intravenous contrast.  Contrast: 184m OMNIPAQUE IOHEXOL 300 MG/ML  SOLN  Comparison: 02/07/2010 CT  Findings: Circumferential wall thickening of the descending colon with mild adjacent inflammation is noted and compatible with colitis.  Mildly enlarged left mesenteric lymph nodes are again noted.  The visualized portions of the celiac artery, SMA, IMA and SMV are patent.  There is no evidence of bowel obstruction or pneumoperitoneum.  The liver, spleen, pancreas, adrenal glands and kidneys are unremarkable. Cholelithiasis identified without CT evidence of cholecystitis. The appendix and bladder are unremarkable.  No free fluid, biliary dilation or abdominal aortic aneurysm identified.  No acute or suspicious bony abnormalities are noted.  IMPRESSION: Colitis of the descending colon - question inflammatory or infectious.  Although this could  represent ischemic colitis given distribution, the visualized mesenteric vessels are patent.  Cholelithiasis without CT evidence of acute cholecystitis.   Original Report Authenticated By: JLura Em M.D.     CBC  Lab 04/03/12 0500 04/02/12 0600 04/01/12 0430 03/30/12 0505 03/29/12 2149  WBC 5.6 5.7 7.6 6.0 4.5  HGB 10.7* 10.2* 9.9* 10.0* 11.0*  HCT 33.2* 31.3* 30.0* 30.5* 32.7*  PLT 511* 458* 423* 413* 420*  MCV 86.7 86.0 86.2 86.2 86.1  MCH 27.9 28.0 28.4 28.2 28.9  MCHC 32.2 32.6 33.0 32.8 33.6  RDW 13.6 13.3 13.2 13.1 13.0  LYMPHSABS -- -- -- -- --  MONOABS -- -- -- -- --  EOSABS -- -- -- -- --  BASOSABS -- -- -- -- --  BANDABS -- -- -- -- --    Chemistries   Lab 04/01/12 0430  NA 144  K 3.9  CL 106  CO2 29  GLUCOSE 79  BUN 5*  CREATININE 0.73  CALCIUM 8.9  MG --  AST --  ALT --  ALKPHOS --  BILITOT --   ------------------------------------------------------------------------------------------------------------------ estimated creatinine clearance is 79 ml/min (by C-G formula based on Cr of 0.73). ------------------------------------------------------------------------------------------------------------------ No results found for this basename: HGBA1C:2 in the last 72 hours ------------------------------------------------------------------------------------------------------------------ No results found for this basename: CHOL:2,HDL:2,LDLCALC:2,TRIG:2,CHOLHDL:2,LDLDIRECT:2 in the last 72 hours ------------------------------------------------------------------------------------------------------------------ No results found for this basename: TSH,T4TOTAL,FREET3,T3FREE,THYROIDAB in the last 72 hours ------------------------------------------------------------------------------------------------------------------  No results found for this basename: VITAMINB12:2,FOLATE:2,FERRITIN:2,TIBC:2,IRON:2,RETICCTPCT:2 in the last 72 hours  Coagulation profile No results  found for this basename: INR:5,PROTIME:5 in the last 168 hours  No results found for this basename: DDIMER:2 in the last 72 hours  Cardiac Enzymes No results found for this basename: CK:3,CKMB:3,TROPONINI:3,MYOGLOBIN:3 in the last 168 hours ------------------------------------------------------------------------------------------------------------------ No components found with this basename: POCBNP:3

## 2012-04-03 NOTE — Progress Notes (Addendum)
[facility] Progress Note  SUBJECTIVE: only one BM this am and stools are starting to form. No abdominal pain, just left sided tenderness. Wants solids to eat  OBJECTIVE:  Vital signs in last 24 hours: Temp:  [98.3 F (36.8 C)-98.4 F (36.9 C)] 98.3 F (36.8 C) (11/03 2042) Pulse Rate:  [75-76] 76  (11/03 2042) Resp:  [18] 18  (11/03 2042) BP: (105-106)/(58-63) 106/63 mmHg (11/03 2042) SpO2:  [96 %-98 %] 96 % (11/03 2042) Last BM Date: 04/02/12 (bm's more soft brown) General:    Pleasant black female in NAD. Heart:  Regular rate and rhythm; Lungs: Respirations even and unlabored, lungs CTA bilaterally Abdomen:  Soft, mild LLQ tenderness, nondistended. Normal bowel sounds. Extremities:  Without edema. Neurologic:  Alert and oriented,  grossly normal neurologically. Psych:  Cooperative. Normal mood and affect.   Lab Results:  Basename 04/03/12 0500 04/02/12 0600 04/01/12 0430  WBC 5.6 5.7 7.6  HGB 10.7* 10.2* 9.9*  HCT 33.2* 31.3* 30.0*  PLT 511* 458* 423*   BMET  Basename 04/01/12 0430  NA 144  K 3.9  CL 106  CO2 29  GLUCOSE 79  BUN 5*  CREATININE 0.73  CALCIUM 8.9     ASSESSMENT / PLAN:   Severe left sided colitis with rectal sparing. Originally thought to have UC but this may be crohn's. Need to rule out superimposed infection (?CMV), tissue stains pending. Stool cultures and C-diff negative. She has continued to show improvement but will likely need biologics at some point. PPD on left forearm is negative. HB surface antigen is negative. We may also need to add immunomodulators. Will send TPMT level in anticipation of starting Imuran. Will try low residue diet today.     LOS: 8 days   Tye Savoy  04/03/2012, 9:10 AM   She has made significant improvement.  Plan to advance diet and switch from solumedrol to prednisone in am.

## 2012-04-03 NOTE — Progress Notes (Signed)
Utilization review completed. Vinie Charity, RN, BSN. 

## 2012-04-03 NOTE — Progress Notes (Signed)
Per PA @ [facility], pt to have low residue diet and all medications to be PO.  After 2200 dose of solu-medrol, order to be discontinued and PA will round in am.  Nsg to continue to monitor for status changes.

## 2012-04-04 ENCOUNTER — Other Ambulatory Visit: Payer: Self-pay

## 2012-04-04 DIAGNOSIS — K5190 Ulcerative colitis, unspecified, without complications: Secondary | ICD-10-CM

## 2012-04-04 LAB — CBC
HCT: 32.4 % — ABNORMAL LOW (ref 36.0–46.0)
Hemoglobin: 10.5 g/dL — ABNORMAL LOW (ref 12.0–15.0)
MCH: 28.2 pg (ref 26.0–34.0)
MCHC: 32.4 g/dL (ref 30.0–36.0)
MCV: 86.9 fL (ref 78.0–100.0)
Platelets: 494 10*3/uL — ABNORMAL HIGH (ref 150–400)
RBC: 3.73 MIL/uL — ABNORMAL LOW (ref 3.87–5.11)
RDW: 13.9 % (ref 11.5–15.5)
WBC: 5.9 10*3/uL (ref 4.0–10.5)

## 2012-04-04 LAB — BASIC METABOLIC PANEL
BUN: 10 mg/dL (ref 6–23)
CO2: 30 mEq/L (ref 19–32)
Calcium: 9.6 mg/dL (ref 8.4–10.5)
Chloride: 102 mEq/L (ref 96–112)
Creatinine, Ser: 0.77 mg/dL (ref 0.50–1.10)
GFR calc Af Amer: >90 mL/min (ref >90)
GFR calc non Af Amer: >90 mL/min (ref >90)
Glucose, Bld: 113 mg/dL — ABNORMAL HIGH (ref 70–99)
Potassium: 4.9 mEq/L (ref 3.5–5.1)
Sodium: 139 mEq/L (ref 135–145)

## 2012-04-04 MED ORDER — PREDNISONE 20 MG PO TABS: 40.0000 mg | ORAL_TABLET | Freq: Every day | ORAL | Status: DC

## 2012-04-04 MED ORDER — PREDNISONE 20 MG PO TABS
40.0000 mg | ORAL_TABLET | Freq: Every day | ORAL | Status: DC
  Administered 2012-04-04: 40 mg via ORAL
  Filled 2012-04-04 (×3): qty 2

## 2012-04-04 NOTE — Discharge Summary (Signed)
[facility]                                                                                   Brianna Santana, is a 56 y.o. female  DOB June 07, 1955  MRN [id number].  Admission date:  03/26/2012  Discharge Date:  04/04/2012  Primary MD  [name], MD  Admitting Physician  [name], MD  Admission Diagnosis  Asthma [493] Left sided ulcerative (chronic) colitis [556.5] Colitis [558.9] Anemia [285.9] Colitis  rectal bleeding, colitis  Discharge Diagnosis     Principal Problem:  *Colitis Active Problems:  ASTHMA  ULCERATIVE COLITIS-LEFT SIDE  Anemia      Past Medical History  Diagnosis Date  . Internal hemorrhoids   . Ulcerative colitis, left sided 1998  . Asthma   . Anemia   . C. difficile colitis 2011 and 07/2010  . GERD (gastroesophageal reflux disease)   . Hyperplastic polyp of intestine 09/2007  . Hyperthyroidism     S/P I-131 RX  . Asthma   . Cervical dysplasia     Past Surgical History  Procedure Date  . Tonsillectomy   . Colonoscopy   . Breast lumpectomy 1980's, 2000    Right Breast x 2  . Total abdominal hysterectomy 2000  . Colposcopy   . Flexible sigmoidoscopy 03/29/2012    Procedure: FLEXIBLE SIGMOIDOSCOPY;  Surgeon: Ladene Artist, MD,FACG;  Location: [facility] ENDOSCOPY;  Service: Endoscopy;  Laterality: N/A;     Recommendations for primary care physician for things to follow:   Please follow patient CBC BMP, prednisone dose and please make sure that patient follows with GI outpatient. For colitis and cholelithiasis.   Discharge Diagnoses:   Principal Problem:  *Colitis Active Problems:  ASTHMA  ULCERATIVE COLITIS-LEFT SIDE  Anemia    Discharge Condition: Stable   Diet recommendation: See Discharge Instructions below   Consults [facility]   History of present illness and  Hospital Course:  See H&P, Labs, Consult and Test reports for all details in brief, patient was admitted for abdominal pain and diarrhea in  the left lower quadrant likely secondary to a telemetry bowel disease ulcerative colitis versus Crohn's, patient seen by GI physician Dr. [name] was placed on IV steroids with good effect, she has been tested negative for PPD, TMT testing has been ordered, she will likely require immunomodulating therapy by GI physician Dr. [name] as outpatient, patient currently feels much better and is relatively symptom-free eager to go home, I have discussed her case with GI physician Dr. [name] over the phone plan is to place the patient on 40 mg of prednisone and have her follow with him in the office. Should have tolerated IV Solu-Medrol without any difficulty this admission. Question her listed allergy to steroids.  Note patient was found to have gallstones on ultrasound and CT scan which are asymptomatic she will benefit from outpatient GI and surgery followup.    She has anemia of chronic disease and GERD for which no acute intervention was needed. Outpatient followup with PCP and GI is recommended.    Today   Subjective:   [name] today has no headache,no chest abdominal pain,no new weakness tingling  or numbness, feels much better wants to go home today.   Objective:   Blood pressure 102/63, pulse 69, temperature 98.3 F (36.8 C), temperature source Oral, resp. rate 16, height 5' 4"  (1.626 m), weight 77.111 kg (170 lb), SpO2 99.00%.   Intake/Output Summary (Last 24 hours) at 04/04/12 0932 Last data filed at 04/04/12 0555  Gross per 24 hour  Intake   1200 ml  Output      0 ml  Net   1200 ml    Exam Awake Alert, Oriented *3, No new F.N deficits, Normal affect [place].AT,PERRAL Supple Neck,No JVD, No cervical lymphadenopathy appriciated.  Symmetrical Chest wall movement, Good air movement bilaterally, CTAB RRR,No Gallops,Rubs or new Murmurs, No Parasternal Heave +ve B.Sounds, Abd Soft, Non tender, No organomegaly appriciated, No rebound -guarding or rigidity. No Cyanosis, Clubbing or  edema, No new Rash or bruise  Data Review       US Abdomen Complete  03/28/2012  *RADIOLOGY REPORT*  Clinical Data:  Gallstones  ABDOMINAL ULTRASOUND COMPLETE  Comparison:  03/26/2012  Findings:  Gallbladder:  Multiple stones are noted within the lumen of the gallbladder.  These measure up to 1.6 cm.  No wall thickening or pericholecystic fluid.  Negative sonographic Murphy's sign.  Common Bile Duct:  Within normal limits in caliber.  Liver: No focal mass lesion identified.  Within normal limits in parenchymal echogenicity.  IVC:  Appears normal.  Pancreas:  No abnormality identified.  Spleen:  Within normal limits in size and echotexture.  Right kidney:  Normal in size and parenchymal echogenicity.  No evidence of mass or hydronephrosis.  Left kidney:  Normal in size and parenchymal echogenicity.  No evidence of mass or hydronephrosis.  Abdominal Aorta:  No aneurysm identified.  IMPRESSION:  1.  Gallstones.   Original Report Authenticated By: Angelita Ingles, M.D.    Ct Abdomen Pelvis W Contrast  03/26/2012  *RADIOLOGY REPORT*  Clinical Data: 56 year old female with abdominal and pelvic pain and rectal bleeding.  CT ABDOMEN AND PELVIS WITH CONTRAST  Technique:  Multidetector CT imaging of the abdomen and pelvis was performed following the standard protocol during bolus administration of intravenous contrast.  Contrast: 116m OMNIPAQUE IOHEXOL 300 MG/ML  SOLN  Comparison: 02/07/2010 CT  Findings: Circumferential wall thickening of the descending colon with mild adjacent inflammation is noted and compatible with colitis.  Mildly enlarged left mesenteric lymph nodes are again noted.  The visualized portions of the celiac artery, SMA, IMA and SMV are patent.  There is no evidence of bowel obstruction or pneumoperitoneum.  The liver, spleen, pancreas, adrenal glands and kidneys are unremarkable. Cholelithiasis identified without CT evidence of cholecystitis. The appendix and bladder are unremarkable.  No  free fluid, biliary dilation or abdominal aortic aneurysm identified.  No acute or suspicious bony abnormalities are noted.  IMPRESSION: Colitis of the descending colon - question inflammatory or infectious.  Although this could represent ischemic colitis given distribution, the visualized mesenteric vessels are patent.  Cholelithiasis without CT evidence of acute cholecystitis.   Original Report Authenticated By: JLura Em M.D.     Micro Results      Recent Results (from the past 240 hour(s))  CLOSTRIDIUM DIFFICILE BY PCR     Status: Normal   Collection Time   03/26/12  5:02 PM      Component Value Range Status Comment   C difficile by pcr NEGATIVE  NEGATIVE Final   URINE CULTURE     Status: Normal   Collection  Time   03/27/12 10:17 PM      Component Value Range Status Comment   Specimen Description URINE, CLEAN CATCH   Final    Special Requests CX ADDED AT 2241   Final    Culture  Setup Time 03/28/2012 04:29   Final    Colony Count NO GROWTH   Final    Culture NO GROWTH   Final    Report Status 03/29/2012 FINAL   Final   STOOL CULTURE     Status: Normal   Collection Time   03/28/12 10:51 PM      Component Value Range Status Comment   Specimen Description STOOL   Final    Special Requests Normal   Final    Culture     Final    Value: NO SALMONELLA, SHIGELLA, CAMPYLOBACTER, YERSINIA, OR E.COLI 0157:H7 ISOLATED     Note: REDUCED NORMAL FLORA PRESENT   Report Status 04/01/2012 FINAL   Final   OVA AND PARASITE EXAMINATION     Status: Normal   Collection Time   03/28/12 10:52 PM      Component Value Range Status Comment   Specimen Description STOOL   Final    Special Requests Normal   Final    Ova and parasites     Final    Value: ABUNDANT WBC ABUNDANT RBC'S NO OVA OR PARASITES SEEN   Report Status 03/29/2012 FINAL   Final      CBC w Diff: Lab Results  Component Value Date   WBC 5.9 04/04/2012   HGB 10.5* 04/04/2012   HCT 32.4* 04/04/2012   PLT 494* 04/04/2012    LYMPHOPCT 38.7 07/13/2010   MONOPCT 9.6 07/13/2010   EOSPCT 8.3* 07/13/2010   BASOPCT 1.4 07/13/2010    CMP: Lab Results  Component Value Date   NA 139 04/04/2012   K 4.9 04/04/2012   CL 102 04/04/2012   CO2 30 04/04/2012   BUN 10 04/04/2012   CREATININE 0.77 04/04/2012   PROT 6.6 03/26/2012   ALBUMIN 2.7* 03/26/2012   BILITOT 0.3 03/26/2012   ALKPHOS 92 03/26/2012   AST 16 03/26/2012   ALT 35 03/26/2012  .   Discharge Instructions     Follow with Primary MD [name], MD in 3 days   Get CBC, CMP, checked 3 days by Primary MD and again as instructed by your Primary MD.   Get Medicines reviewed and adjusted.  Please request your Prim.MD to go over all Hospital Tests and Procedure/Radiological results at the follow up, please get all Hospital records sent to your Prim MD by signing hospital release before you go home.  Activity: As tolerated with Full fall precautions use walker/cane & assistance as needed   Diet:  Heart healthy  For Heart failure patients - Check your Weight same time everyday, if you gain over 2 pounds, or you develop in leg swelling, experience more shortness of breath or chest pain, call your Primary MD immediately. Follow Cardiac Low Salt Diet and 1.8 lit/day fluid restriction.  Disposition Home   If you experience worsening of your admission symptoms, develop shortness of breath, life threatening emergency, suicidal or homicidal thoughts you must seek medical attention immediately by calling 911 or calling your MD immediately  if symptoms less severe.  You Must read complete instructions/literature along with all the possible adverse reactions/side effects for all the Medicines you take and that have been prescribed to you. Take any new Medicines after you have completely understood and  accpet all the possible adverse reactions/side effects.   Do not drive and provide baby sitting services if your were admitted for syncope or siezures until you have seen  by Primary MD or a Neurologist and advised to do so again.  Do not drive when taking Pain medications.    Do not take more than prescribed Pain, Sleep and Anxiety Medications  Special Instructions: If you have smoked or chewed Tobacco  in the last 2 yrs please stop smoking, stop any regular Alcohol  and or any Recreational drug use.  Wear Seat belts while driving.  Follow-up Information    Follow up with AVBUERE,EDWIN A, MD. Schedule an appointment as soon as possible for a visit in 3 days.   Contact information:   [place] [address]       Follow up with Erskine Emery, MD. Schedule an appointment as soon as possible for a visit in 1 week.   Contact information:   [address] [place] [address] [phone]            Discharge Medications     Medication List     As of 04/04/2012  9:32 AM    START taking these medications         predniSONE 20 MG tablet   Commonly known as: DELTASONE   Take 2 tablets (40 mg total) by mouth daily with breakfast.      CONTINUE taking these medications         budesonide-formoterol 160-4.5 MCG/ACT inhaler   Commonly known as: SYMBICORT   Inhale 2 puffs into the lungs 2 (two) times daily.      Calcium 600+D 600-400 MG-UNIT per tablet   Generic drug: Calcium Carbonate-Vitamin D      cetirizine 10 MG tablet   Commonly known as: ZYRTEC      clobetasol cream 0.05 %   Commonly known as: TEMOVATE      HYDROcodone-acetaminophen 5-325 MG per tablet   Commonly known as: NORCO/VICODIN      meloxicam 15 MG tablet   Commonly known as: MOBIC      * mesalamine 1.2 G EC tablet   Commonly known as: LIALDA      * mesalamine 1000 MG suppository   Commonly known as: CANASA      multivitamin with minerals Tabs      omeprazole 40 MG capsule   Commonly known as: PRILOSEC      PROAIR HFA 108 (90 BASE) MCG/ACT inhaler   Generic drug: albuterol      SINGULAIR 10 MG tablet   Generic  drug: montelukast      Vitamin D 2000 UNITS tablet     * Notice: This list has 2 medication(s) that are the same as other medications prescribed for you. Read the directions carefully, and ask your doctor or other care provider to review them with you.        Where to get your medications    These are the prescriptions that you need to pick up. We sent them to a specific pharmacy, so you will need to go there to get them.   [facility] [address], [place]    [place] [address]    Phone: [phone]        predniSONE 20 MG tablet  Total Time in preparing paper work, data evaluation and todays exam - 35 minutes  Thurnell Lose M.D on 04/04/2012 at 9:32 AM  [place]  [phone]

## 2012-04-04 NOTE — Progress Notes (Signed)
[name] Progress Note  SUBJECTIVE: feel okay. No abdominal pain. No diarrhea  OBJECTIVE:  Vital signs in last 24 hours: Temp:  [98.1 F (36.7 C)-98.6 F (37 C)] 98.3 F (36.8 C) (11/05 0556) Pulse Rate:  [69-82] 69  (11/05 0556) Resp:  [16] 16  (11/05 0556) BP: (95-107)/(57-71) 102/63 mmHg (11/05 0556) SpO2:  [97 %-99 %] 99 % (11/05 0556) Last BM Date: 04/03/12 General:    Pleasant black female in NAD Abdomen:  Soft, nontender and nondistended. Normal bowel sounds. Neurologic:  Alert and oriented,  grossly normal neurologically. Psych:  Cooperative. Normal mood and affect.  ILab Results:  Basename 04/04/12 0540 04/03/12 0500 04/02/12 0600  WBC 5.9 5.6 5.7  HGB 10.5* 10.7* 10.2*  HCT 32.4* 33.2* 31.3*  PLT 494* 511* 458*   BMET  Basename 04/04/12 0540  NA 139  K 4.9  CL 102  CO2 30  GLUCOSE 113*  BUN 10  CREATININE 0.77  CALCIUM 9.6     ASSESSMENT / PLAN:  Severe left sided colitis with rectal sparing. Originally thought to have UC but  Crohn's disease a possibility. CMV stain negative. Stool cultures and C-diff negative. Patient significantly improved on IV steroids. She is going home today on Prednisone 38m . She will likely need biologics and / or immunomodulators at some point. PPD on left forearm is negative. HB surface antigen is negative. Asked patient to go to our office within next couple of days to get TPMT level in anticipation of starting 661m She has follow up appointment with Dr. StFuller Plann 11/13.       Principal Problem:  *Colitis Active Problems:  ASTHMA  ULCERATIVE COLITIS-LEFT SIDE  Anemia     LOS: 9 days   Brianna Savoy11/09/2011, 10:11 AM

## 2012-04-04 NOTE — Progress Notes (Signed)
Pt. Tolerated prednisone given at 11:08 am and it is now 13:25 and she has no complaints.  Pt. States "I feel fine", discharge paper work done.

## 2012-04-04 NOTE — Progress Notes (Signed)
I have personally taken an interval history, reviewed the chart, and examined the patient.  I agree with the extender's note, impression and recommendations.  Sandy Salaam. [name], MD, [facility] [phone]

## 2012-04-05 ENCOUNTER — Other Ambulatory Visit: Payer: Self-pay

## 2012-04-05 ENCOUNTER — Other Ambulatory Visit: Payer: Managed Care, Other (non HMO)

## 2012-04-05 DIAGNOSIS — K5190 Ulcerative colitis, unspecified, without complications: Secondary | ICD-10-CM

## 2012-04-11 LAB — THIOPURINE METHYLTRANSFERASE (TPMT), RBC: TPMT Activity:: 15.6 Units/mL RBC

## 2012-04-12 ENCOUNTER — Other Ambulatory Visit (INDEPENDENT_AMBULATORY_CARE_PROVIDER_SITE_OTHER): Payer: Managed Care, Other (non HMO)

## 2012-04-12 ENCOUNTER — Encounter: Payer: Self-pay | Admitting: Gastroenterology

## 2012-04-12 ENCOUNTER — Telehealth: Payer: Self-pay | Admitting: Gastroenterology

## 2012-04-12 ENCOUNTER — Ambulatory Visit (INDEPENDENT_AMBULATORY_CARE_PROVIDER_SITE_OTHER): Payer: Managed Care, Other (non HMO) | Admitting: Gastroenterology

## 2012-04-12 VITALS — BP 120/76 | HR 80 | Ht 63.0 in | Wt 169.0 lb

## 2012-04-12 DIAGNOSIS — Z23 Encounter for immunization: Secondary | ICD-10-CM

## 2012-04-12 DIAGNOSIS — Z79899 Other long term (current) drug therapy: Secondary | ICD-10-CM

## 2012-04-12 DIAGNOSIS — K5010 Crohn's disease of large intestine without complications: Secondary | ICD-10-CM

## 2012-04-12 LAB — CBC WITH DIFFERENTIAL/PLATELET
Basophils Absolute: 0 10*3/uL (ref 0.0–0.1)
Basophils Relative: 0.4 % (ref 0.0–3.0)
Eosinophils Absolute: 0.2 10*3/uL (ref 0.0–0.7)
Eosinophils Relative: 2.8 % (ref 0.0–5.0)
HCT: 33.9 % — ABNORMAL LOW (ref 36.0–46.0)
Hemoglobin: 11.1 g/dL — ABNORMAL LOW (ref 12.0–15.0)
Lymphocytes Relative: 32.8 % (ref 12.0–46.0)
Lymphs Abs: 2.1 10*3/uL (ref 0.7–4.0)
MCHC: 32.7 g/dL (ref 30.0–36.0)
MCV: 89.8 fl (ref 78.0–100.0)
Monocytes Absolute: 0.9 10*3/uL (ref 0.1–1.0)
Monocytes Relative: 13.7 % — ABNORMAL HIGH (ref 3.0–12.0)
Neutro Abs: 3.2 10*3/uL (ref 1.4–7.7)
Neutrophils Relative %: 50.3 % (ref 43.0–77.0)
Platelets: 548 10*3/uL — ABNORMAL HIGH (ref 150.0–400.0)
RBC: 3.78 Mil/uL — ABNORMAL LOW (ref 3.87–5.11)
RDW: 14.8 % — ABNORMAL HIGH (ref 11.5–14.6)
WBC: 6.4 10*3/uL (ref 4.5–10.5)

## 2012-04-12 LAB — COMPREHENSIVE METABOLIC PANEL
ALT: 73 U/L — ABNORMAL HIGH (ref 0–35)
AST: 18 U/L (ref 0–37)
Albumin: 3.4 g/dL — ABNORMAL LOW (ref 3.5–5.2)
Alkaline Phosphatase: 73 U/L (ref 39–117)
BUN: 12 mg/dL (ref 6–23)
CO2: 29 mEq/L (ref 19–32)
Calcium: 9.3 mg/dL (ref 8.4–10.5)
Chloride: 101 mEq/L (ref 96–112)
Creatinine, Ser: 0.8 mg/dL (ref 0.4–1.2)
GFR: 102.59 mL/min (ref >60.00)
Glucose, Bld: 79 mg/dL (ref 70–99)
Potassium: 4 mEq/L (ref 3.5–5.1)
Sodium: 139 mEq/L (ref 135–145)
Total Bilirubin: 0.6 mg/dL (ref 0.3–1.2)
Total Protein: 6.7 g/dL (ref 6.0–8.3)

## 2012-04-12 MED ORDER — MERCAPTOPURINE 50 MG PO TABS: ORAL_TABLET | ORAL | Status: DC

## 2012-04-12 MED ORDER — ADALIMUMAB 40 MG/0.8ML ~~LOC~~ KIT: PACK | SUBCUTANEOUS | Status: DC

## 2012-04-12 NOTE — Progress Notes (Addendum)
History of Present Illness: This is a 56 year old female recently hospitalized for a severe flare of Crohn's colitis. She had segmental colitis involving the left colon with deep ulcerations noted on CT scan and flexible sigmoidoscopy. Her symptoms responded to an 8 or 9 day course of intravenous Solu-Medrol and she was discharged on prednisone. She relates severe fatigue, 4-5 semi-formed bowel movements each day with occasional bleeding. She also notes occasional nocturnal bowel movements. Her symptoms are substantially improved from her hospitalization but she remains quite symptomatic.  Review of Systems: Pertinent positive and negative review of systems were noted in the above HPI section. All other review of systems were otherwise negative.  Allergies  Allergen Reactions  . Levofloxacin Other (See Comments)    Doesn't remember reaction  . Prednisone Swelling        Outpatient Prescriptions Prior to Visit  Medication Sig Dispense Refill  . albuterol (PROAIR HFA) 108 (90 BASE) MCG/ACT inhaler Inhale 2 puffs into the lungs every 4 (four) hours as needed. For shortness of breath or wheezing      . budesonide-formoterol (SYMBICORT) 160-4.5 MCG/ACT inhaler Inhale 2 puffs into the lungs 2 (two) times daily.  1 Inhaler  12  . Calcium Carbonate-Vitamin D (CALCIUM 600+D) 600-400 MG-UNIT per tablet Take 1 tablet by mouth 2 (two) times daily.        . cetirizine (ZYRTEC) 10 MG tablet Take 10 mg by mouth daily.      . Cholecalciferol (VITAMIN D) 2000 UNITS tablet Take 2,000 Units by mouth daily.      . clobetasol cream (TEMOVATE) 2.44 % Apply 1 application topically as needed. For rash      . HYDROcodone-acetaminophen (NORCO) 5-325 MG per tablet Take 1 tablet by mouth every 6 (six) hours as needed. For pain      . meloxicam (MOBIC) 15 MG tablet Take 15 mg by mouth daily.      . mesalamine (CANASA) 1000 MG suppository Place 1,000 mg rectally at bedtime as needed.      . mesalamine (LIALDA) 1.2 G EC  tablet Take 1,200 mg by mouth daily with breakfast.      . montelukast (SINGULAIR) 10 MG tablet Take 10 mg by mouth at bedtime.       . Multiple Vitamin (MULTIVITAMIN WITH MINERALS) TABS Take 1 tablet by mouth daily.      Marland Kitchen omeprazole (PRILOSEC) 40 MG capsule Take 40 mg by mouth daily as needed. For heartburn      . predniSONE (DELTASONE) 20 MG tablet Take 2 tablets (40 mg total) by mouth daily with breakfast.  20 tablet  0   Facility-Administered Medications Prior to Visit  Medication Dose Route Frequency Provider Last Rate Last Dose  . [DISCONTINUED] 0.9 %  sodium chloride infusion  500 mL Intravenous Continuous Ladene Artist, MD,FACG       Last reviewed on 04/12/2012  9:24 AM by Ladene Artist, MD,FACG Past Medical History  Diagnosis Date  . Internal hemorrhoids   . Crohn's colitis   . Asthma   . Anemia   . C. difficile colitis 2011 and 07/2010  . GERD (gastroesophageal reflux disease)   . Hyperplastic polyp of intestine 09/2007  . Hyperthyroidism     S/P I-131 RX  . Asthma   . Cervical dysplasia    Past Surgical History  Procedure Date  . Tonsillectomy   . Colonoscopy   . Breast lumpectomy 1980's, 2000    Right Breast x 2  . Total abdominal  hysterectomy 2000  . Colposcopy   . Flexible sigmoidoscopy 03/29/2012    Procedure: FLEXIBLE SIGMOIDOSCOPY;  Surgeon: Ladene Artist, MD,FACG;  Location: [facility] ENDOSCOPY;  Service: Endoscopy;  Laterality: N/A;   History   Social History  . Marital Status: Married    Spouse Name: N/A    Number of Children: N/A  . Years of Education: N/A   Social History Main Topics  . Smoking status: Former Smoker -- 10 years    Types: Cigarettes    Quit date: 09/07/1988  . Smokeless tobacco: Never Used  . Alcohol Use: No  . Drug Use: No  . Sexually Active: Yes    Birth Control/ Protection: Surgical   Other Topics Concern  . None   Social History Narrative  . None   Family History  Problem Relation Age of Onset  . Diabetes Father     . Diabetes Sister     x 2  . Hypertension Sister     x 2  . Diabetes Brother   . Hypertension Brother   . Colon cancer Neg Hx      Current Medications, Allergies, Past Medical History, Past Surgical History, Family History and Social History were reviewed in [employer] record.   Physical Exam: General: Well developed , well nourished, fatigued-appearing, no acute distress Head: Normocephalic and atraumatic Eyes:  sclerae anicteric, EOMI Ears: Normal auditory acuity Mouth: No deformity or lesions Neck: Supple, no masses or thyromegaly Lungs: Clear throughout to auscultation Heart: Regular rate and rhythm; no murmurs, rubs or bruits Abdomen: Soft, mild epigastric and left-sided abdominal tenderness without rebound or guarding and non distended. No masses, hepatosplenomegaly or hernias noted. Normal Bowel sounds Musculoskeletal: Symmetrical with no gross deformities  Skin: No lesions on visible extremities Pulses:  Normal pulses noted Extremities: No clubbing, cyanosis, edema or deformities noted Neurological: Alert oriented x 4, grossly nonfocal Cervical Nodes:  No significant cervical adenopathy Inguinal Nodes: No significant inguinal adenopathy Psychological:  Alert and cooperative. Normal mood and affect  Assessment and Recommendations:  1. Crohn's colitis, severe. Persistent symptoms that have not fully responded to corticosteroids. PPD and HBsAg were negative during her hospital stay. TPMT level returned today in the lower normal range. We had a long discussion about options for treatment including long-term 6 MP with corticosteroids for several months, a biologic agent and 6 MP or a biologic alone. She had the opportunity to ask questions and I answered her questions to the best of my ability. Since she continues to feel poorly she would like to be aggressive with therapy. Begin 6-MP 75 mg daily and biologic therapy. Will check with her insurance  carrier to assess her biologic coverage. Obtain a CMET, CBC and lipase today blood work in 2 weeks. Proceed with flu vaccine today. At an upcoming office visit will plan for a prednisone taper and plan to discontinue mesalamine.

## 2012-04-12 NOTE — Telephone Encounter (Signed)
Directions clarified with the pharmacist at [facility]

## 2012-04-12 NOTE — Patient Instructions (Addendum)
Your physician has requested that you go to the basement for the following lab work before leaving today: cbc, cmet.  We have sent the following medications to your pharmacy for you to pick up at your convenience: 7m.  You will be contacted with further information on when to pick up your Humira.   You need to obtain a pneumovax vaccine from your Primary Care Physician.  cc: ENolene Ebbs MD

## 2012-04-13 ENCOUNTER — Other Ambulatory Visit: Payer: Self-pay

## 2012-04-13 DIAGNOSIS — R7401 Elevation of levels of liver transaminase levels: Secondary | ICD-10-CM

## 2012-04-13 DIAGNOSIS — R7402 Elevation of levels of lactic acid dehydrogenase (LDH): Secondary | ICD-10-CM

## 2012-04-14 ENCOUNTER — Other Ambulatory Visit: Payer: Self-pay

## 2012-04-14 ENCOUNTER — Other Ambulatory Visit: Payer: Self-pay | Admitting: Gastroenterology

## 2012-04-14 ENCOUNTER — Other Ambulatory Visit (INDEPENDENT_AMBULATORY_CARE_PROVIDER_SITE_OTHER): Payer: Managed Care, Other (non HMO)

## 2012-04-14 DIAGNOSIS — K5010 Crohn's disease of large intestine without complications: Secondary | ICD-10-CM

## 2012-04-14 DIAGNOSIS — Z79899 Other long term (current) drug therapy: Secondary | ICD-10-CM

## 2012-04-14 DIAGNOSIS — K5090 Crohn's disease, unspecified, without complications: Secondary | ICD-10-CM

## 2012-04-14 DIAGNOSIS — R7401 Elevation of levels of liver transaminase levels: Secondary | ICD-10-CM

## 2012-04-14 DIAGNOSIS — R7402 Elevation of levels of lactic acid dehydrogenase (LDH): Secondary | ICD-10-CM

## 2012-04-14 LAB — COMPREHENSIVE METABOLIC PANEL
ALT: 94 U/L — ABNORMAL HIGH (ref 0–35)
AST: 28 U/L (ref 0–37)
Albumin: 3.5 g/dL (ref 3.5–5.2)
Alkaline Phosphatase: 77 U/L (ref 39–117)
BUN: 14 mg/dL (ref 6–23)
CO2: 26 mEq/L (ref 19–32)
Calcium: 8.9 mg/dL (ref 8.4–10.5)
Chloride: 107 mEq/L (ref 96–112)
Creatinine, Ser: 0.9 mg/dL (ref 0.4–1.2)
GFR: 81.04 mL/min (ref >60.00)
Glucose, Bld: 99 mg/dL (ref 70–99)
Potassium: 4.1 mEq/L (ref 3.5–5.1)
Sodium: 140 mEq/L (ref 135–145)
Total Bilirubin: 0.5 mg/dL (ref 0.3–1.2)
Total Protein: 6.7 g/dL (ref 6.0–8.3)

## 2012-04-14 LAB — CBC WITH DIFFERENTIAL/PLATELET
Basophils Absolute: 0 10*3/uL (ref 0.0–0.1)
Basophils Relative: 0.5 % (ref 0.0–3.0)
Eosinophils Absolute: 0.1 10*3/uL (ref 0.0–0.7)
Eosinophils Relative: 1.4 % (ref 0.0–5.0)
HCT: 32.5 % — ABNORMAL LOW (ref 36.0–46.0)
Hemoglobin: 10.4 g/dL — ABNORMAL LOW (ref 12.0–15.0)
Lymphocytes Relative: 8.9 % — ABNORMAL LOW (ref 12.0–46.0)
Lymphs Abs: 0.6 10*3/uL — ABNORMAL LOW (ref 0.7–4.0)
MCHC: 32 g/dL (ref 30.0–36.0)
MCV: 90.5 fl (ref 78.0–100.0)
Monocytes Absolute: 0.7 10*3/uL (ref 0.1–1.0)
Monocytes Relative: 9.7 % (ref 3.0–12.0)
Neutro Abs: 5.4 10*3/uL (ref 1.4–7.7)
Neutrophils Relative %: 79.5 % — ABNORMAL HIGH (ref 43.0–77.0)
Platelets: 508 10*3/uL — ABNORMAL HIGH (ref 150.0–400.0)
RBC: 3.59 Mil/uL — ABNORMAL LOW (ref 3.87–5.11)
RDW: 14.7 % — ABNORMAL HIGH (ref 11.5–14.6)
WBC: 6.8 10*3/uL (ref 4.5–10.5)

## 2012-04-14 LAB — LIPASE: Lipase: 33 U/L (ref 11.0–59.0)

## 2012-04-14 MED ORDER — PREDNISONE 10 MG PO TABS: 40.0000 mg | ORAL_TABLET | Freq: Every day | ORAL | Status: DC

## 2012-04-14 NOTE — Progress Notes (Signed)
Patient ID: Brianna Santana, female   DOB: 1955/09/17, 56 y.o.   MRN: [id number] Patient here today with her husband for Humira injection teaching.  The patient was able to properly inject herself with 3 of the Humira pens using aseptic technique, after I demonstrated with the first pen.  All questions were answered.  She tolerated the injections well.  She verbalized understanding to take the next 2 Humira pens in 2 weeks, followed by 1 pen every other week.  She and her husband know to call back for any questions or concerns.

## 2012-04-15 LAB — HEPATITIS C ANTIBODY: HCV Ab: NEGATIVE

## 2012-04-15 LAB — HEPATITIS A ANTIBODY, TOTAL: Hep A Total Ab: POSITIVE — AB

## 2012-04-17 LAB — HEPATITIS A ANTIBODY, IGM: Hep A IgM: NEGATIVE

## 2012-04-26 ENCOUNTER — Other Ambulatory Visit (INDEPENDENT_AMBULATORY_CARE_PROVIDER_SITE_OTHER): Payer: Managed Care, Other (non HMO)

## 2012-04-26 DIAGNOSIS — K5090 Crohn's disease, unspecified, without complications: Secondary | ICD-10-CM

## 2012-04-26 LAB — CBC WITH DIFFERENTIAL/PLATELET
Basophils Absolute: 0 10*3/uL (ref 0.0–0.1)
Basophils Relative: 0.5 % (ref 0.0–3.0)
Eosinophils Absolute: 0.1 10*3/uL (ref 0.0–0.7)
Eosinophils Relative: 1.2 % (ref 0.0–5.0)
HCT: 31.6 % — ABNORMAL LOW (ref 36.0–46.0)
Hemoglobin: 10.2 g/dL — ABNORMAL LOW (ref 12.0–15.0)
Lymphocytes Relative: 27.3 % (ref 12.0–46.0)
Lymphs Abs: 2.5 10*3/uL (ref 0.7–4.0)
MCHC: 32.3 g/dL (ref 30.0–36.0)
MCV: 91.8 fl (ref 78.0–100.0)
Monocytes Absolute: 0.6 10*3/uL (ref 0.1–1.0)
Monocytes Relative: 6 % (ref 3.0–12.0)
Neutro Abs: 6 10*3/uL (ref 1.4–7.7)
Neutrophils Relative %: 65 % (ref 43.0–77.0)
Platelets: 361 10*3/uL (ref 150.0–400.0)
RBC: 3.44 Mil/uL — ABNORMAL LOW (ref 3.87–5.11)
RDW: 15.5 % — ABNORMAL HIGH (ref 11.5–14.6)
WBC: 9.3 10*3/uL (ref 4.5–10.5)

## 2012-04-26 LAB — COMPREHENSIVE METABOLIC PANEL
ALT: 40 U/L — ABNORMAL HIGH (ref 0–35)
AST: 21 U/L (ref 0–37)
Albumin: 3.6 g/dL (ref 3.5–5.2)
Alkaline Phosphatase: 67 U/L (ref 39–117)
BUN: 13 mg/dL (ref 6–23)
CO2: 28 mEq/L (ref 19–32)
Calcium: 9 mg/dL (ref 8.4–10.5)
Chloride: 103 mEq/L (ref 96–112)
Creatinine, Ser: 0.7 mg/dL (ref 0.4–1.2)
GFR: 109.27 mL/min (ref >60.00)
Glucose, Bld: 96 mg/dL (ref 70–99)
Potassium: 3.8 mEq/L (ref 3.5–5.1)
Sodium: 139 mEq/L (ref 135–145)
Total Bilirubin: 0.9 mg/dL (ref 0.3–1.2)
Total Protein: 6.6 g/dL (ref 6.0–8.3)

## 2012-04-26 LAB — LIPASE: Lipase: 32 U/L (ref 11.0–59.0)

## 2012-05-05 ENCOUNTER — Telehealth: Payer: Self-pay | Admitting: Gastroenterology

## 2012-05-05 NOTE — Telephone Encounter (Signed)
noted 

## 2012-05-11 ENCOUNTER — Ambulatory Visit (INDEPENDENT_AMBULATORY_CARE_PROVIDER_SITE_OTHER): Payer: Managed Care, Other (non HMO) | Admitting: Gastroenterology

## 2012-05-11 ENCOUNTER — Encounter: Payer: Self-pay | Admitting: Gastroenterology

## 2012-05-11 ENCOUNTER — Other Ambulatory Visit (INDEPENDENT_AMBULATORY_CARE_PROVIDER_SITE_OTHER): Payer: Managed Care, Other (non HMO)

## 2012-05-11 VITALS — BP 114/70 | HR 80 | Ht 63.0 in | Wt 181.2 lb

## 2012-05-11 DIAGNOSIS — K5010 Crohn's disease of large intestine without complications: Secondary | ICD-10-CM

## 2012-05-11 LAB — COMPREHENSIVE METABOLIC PANEL
ALT: 123 U/L — ABNORMAL HIGH (ref 0–35)
AST: 48 U/L — ABNORMAL HIGH (ref 0–37)
Albumin: 4.1 g/dL (ref 3.5–5.2)
Alkaline Phosphatase: 55 U/L (ref 39–117)
BUN: 16 mg/dL (ref 6–23)
CO2: 27 mEq/L (ref 19–32)
Calcium: 9.5 mg/dL (ref 8.4–10.5)
Chloride: 101 mEq/L (ref 96–112)
Creatinine, Ser: 0.7 mg/dL (ref 0.4–1.2)
GFR: 105.81 mL/min (ref >60.00)
Glucose, Bld: 107 mg/dL — ABNORMAL HIGH (ref 70–99)
Potassium: 4.1 mEq/L (ref 3.5–5.1)
Sodium: 136 mEq/L (ref 135–145)
Total Bilirubin: 0.9 mg/dL (ref 0.3–1.2)
Total Protein: 7.3 g/dL (ref 6.0–8.3)

## 2012-05-11 LAB — CBC WITH DIFFERENTIAL/PLATELET
Basophils Absolute: 0 10*3/uL (ref 0.0–0.1)
Basophils Relative: 0.2 % (ref 0.0–3.0)
Eosinophils Absolute: 0 10*3/uL (ref 0.0–0.7)
Eosinophils Relative: 0.2 % (ref 0.0–5.0)
HCT: 35.1 % — ABNORMAL LOW (ref 36.0–46.0)
Hemoglobin: 11.3 g/dL — ABNORMAL LOW (ref 12.0–15.0)
Lymphocytes Relative: 10.8 % — ABNORMAL LOW (ref 12.0–46.0)
Lymphs Abs: 0.8 10*3/uL (ref 0.7–4.0)
MCHC: 32.2 g/dL (ref 30.0–36.0)
MCV: 92.5 fl (ref 78.0–100.0)
Monocytes Absolute: 0.2 10*3/uL (ref 0.1–1.0)
Monocytes Relative: 3.1 % (ref 3.0–12.0)
Neutro Abs: 6 10*3/uL (ref 1.4–7.7)
Neutrophils Relative %: 85.7 % — ABNORMAL HIGH (ref 43.0–77.0)
Platelets: 552 10*3/uL — ABNORMAL HIGH (ref 150.0–400.0)
RBC: 3.79 Mil/uL — ABNORMAL LOW (ref 3.87–5.11)
RDW: 16.4 % — ABNORMAL HIGH (ref 11.5–14.6)
WBC: 7 10*3/uL (ref 4.5–10.5)

## 2012-05-11 LAB — LIPASE: Lipase: 29 U/L (ref 11.0–59.0)

## 2012-05-11 NOTE — Patient Instructions (Addendum)
Your physician has requested that you go to the basement for the following lab work before leaving today: Cmet, CBC, Lipase.  Decrease your prednisone to 30m (3 tablets) once daily x 5 days, then decrease to 20 mg (2 tablets) once daily x 5 days, then decrease to 10 mg (1 tablet) once daily x 5 days, then decrease to 10 mg (1 tablet) every other day x 5 days, then stop.  Continue Humira as current dose.   cc: ENolene Ebbs MD

## 2012-05-11 NOTE — Progress Notes (Signed)
History of Present Illness: This is a 56 year old female Crohn's colitis. She is accompanied by her husband. She has made substantial improvement over the past several weeks. She is having 2-3 formed bowel movements each day with occasional minimal abdominal pain. She has rare episodes of blood in her stool. Tomorrow will be her third dose of Humira. Her energy level is only fair and she feels fatigued and mildly anxious.  Current Medications, Allergies, Past Medical History, Past Surgical History, Family History and Social History were reviewed in [employer] record.  Physical Exam: General: Well developed , well nourished, no acute distress Head: Normocephalic and atraumatic Eyes:  sclerae anicteric, EOMI Ears: Normal auditory acuity Mouth: No deformity or lesions Lungs: Clear throughout to auscultation Heart: Regular rate and rhythm; no murmurs, rubs or bruits Abdomen: Soft, non tender and non distended. No masses, hepatosplenomegaly or hernias noted. Normal Bowel sounds Musculoskeletal: Symmetrical with no gross deformities  Pulses:  Normal pulses noted Extremities: No clubbing, cyanosis, edema or deformities noted Neurological: Alert oriented x 4, grossly nonfocal Psychological:  Alert and cooperative. Normal mood and affect  Assessment and Recommendations:  1. Crohn's colitis-severe. Third dose of Humira tomorrow. Obtain blood work today. Continue Humira, 6-mercaptopurine. Begin prednisone taper as outlined below. Return office visit for 6 weeks.

## 2012-05-15 ENCOUNTER — Other Ambulatory Visit: Payer: Self-pay

## 2012-05-15 DIAGNOSIS — K5090 Crohn's disease, unspecified, without complications: Secondary | ICD-10-CM

## 2012-05-25 ENCOUNTER — Other Ambulatory Visit (INDEPENDENT_AMBULATORY_CARE_PROVIDER_SITE_OTHER): Payer: Managed Care, Other (non HMO)

## 2012-05-25 DIAGNOSIS — K5090 Crohn's disease, unspecified, without complications: Secondary | ICD-10-CM

## 2012-05-25 LAB — CBC WITH DIFFERENTIAL/PLATELET
Basophils Absolute: 0.1 10*3/uL (ref 0.0–0.1)
Basophils Relative: 1.7 % (ref 0.0–3.0)
Eosinophils Absolute: 0.4 10*3/uL (ref 0.0–0.7)
Eosinophils Relative: 6.8 % — ABNORMAL HIGH (ref 0.0–5.0)
HCT: 34.4 % — ABNORMAL LOW (ref 36.0–46.0)
Hemoglobin: 11.1 g/dL — ABNORMAL LOW (ref 12.0–15.0)
Lymphocytes Relative: 20.4 % (ref 12.0–46.0)
Lymphs Abs: 1.1 10*3/uL (ref 0.7–4.0)
MCHC: 32.2 g/dL (ref 30.0–36.0)
MCV: 92.1 fl (ref 78.0–100.0)
Monocytes Absolute: 0.5 10*3/uL (ref 0.1–1.0)
Monocytes Relative: 9 % (ref 3.0–12.0)
Neutro Abs: 3.4 10*3/uL (ref 1.4–7.7)
Neutrophils Relative %: 62.1 % (ref 43.0–77.0)
Platelets: 452 10*3/uL — ABNORMAL HIGH (ref 150.0–400.0)
RBC: 3.74 Mil/uL — ABNORMAL LOW (ref 3.87–5.11)
RDW: 18.2 % — ABNORMAL HIGH (ref 11.5–14.6)
WBC: 5.6 10*3/uL (ref 4.5–10.5)

## 2012-05-25 LAB — COMPREHENSIVE METABOLIC PANEL
ALT: 179 U/L — ABNORMAL HIGH (ref 0–35)
AST: 67 U/L — ABNORMAL HIGH (ref 0–37)
Albumin: 3.7 g/dL (ref 3.5–5.2)
Alkaline Phosphatase: 62 U/L (ref 39–117)
BUN: 17 mg/dL (ref 6–23)
CO2: 28 mEq/L (ref 19–32)
Calcium: 9.2 mg/dL (ref 8.4–10.5)
Chloride: 100 mEq/L (ref 96–112)
Creatinine, Ser: 0.8 mg/dL (ref 0.4–1.2)
GFR: 96.58 mL/min (ref >60.00)
Glucose, Bld: 106 mg/dL — ABNORMAL HIGH (ref 70–99)
Potassium: 3.9 mEq/L (ref 3.5–5.1)
Sodium: 137 mEq/L (ref 135–145)
Total Bilirubin: 1 mg/dL (ref 0.3–1.2)
Total Protein: 7.1 g/dL (ref 6.0–8.3)

## 2012-05-25 LAB — LIPASE: Lipase: 35 U/L (ref 11.0–59.0)

## 2012-05-26 ENCOUNTER — Other Ambulatory Visit: Payer: Self-pay

## 2012-05-26 DIAGNOSIS — K5090 Crohn's disease, unspecified, without complications: Secondary | ICD-10-CM

## 2012-06-07 ENCOUNTER — Telehealth: Payer: Self-pay | Admitting: Gastroenterology

## 2012-06-07 NOTE — Telephone Encounter (Signed)
If she will have any gap in receiving Humira please contact the sales rep to see if they can help

## 2012-06-07 NOTE — Telephone Encounter (Signed)
Patient called to report that it will be one month before her new insurance kicks in and she can't afford Humira cost.  She will have the new insurance in 30 days.  She is advised to let me know insurance asap so we can work on prior [place].  She is currently doing well and has a shot for next week.

## 2012-06-07 NOTE — Telephone Encounter (Signed)
Patient is aware that I will contact the rep for a sample and call her.

## 2012-06-09 NOTE — Telephone Encounter (Signed)
I have sent an email to the rep to try and get some samples

## 2012-06-09 NOTE — Telephone Encounter (Signed)
I spoke with the pharmacy.  The patient has been taking Humira incorrectly.  She took 80 mg on 12/13 and on 12/30, both doses should have been 40 mg.  Her insurance will run out at the end of the month.  The patient should be able to pick up the next dose late next week.  She is aware to call me once she has the dose and not take it until she speaks with me.  Dr. Fuller Plan is aware of the above and wants her to continue on the schedule starting at 40 mg next week.

## 2012-06-19 ENCOUNTER — Other Ambulatory Visit: Payer: Self-pay

## 2012-06-19 ENCOUNTER — Encounter: Payer: Self-pay | Admitting: Gastroenterology

## 2012-06-19 ENCOUNTER — Other Ambulatory Visit (INDEPENDENT_AMBULATORY_CARE_PROVIDER_SITE_OTHER): Payer: Managed Care, Other (non HMO)

## 2012-06-19 ENCOUNTER — Ambulatory Visit (INDEPENDENT_AMBULATORY_CARE_PROVIDER_SITE_OTHER): Payer: Managed Care, Other (non HMO) | Admitting: Gastroenterology

## 2012-06-19 VITALS — BP 112/60 | HR 104 | Ht 64.0 in | Wt 189.0 lb

## 2012-06-19 DIAGNOSIS — K5090 Crohn's disease, unspecified, without complications: Secondary | ICD-10-CM

## 2012-06-19 DIAGNOSIS — K5010 Crohn's disease of large intestine without complications: Secondary | ICD-10-CM

## 2012-06-19 DIAGNOSIS — K5190 Ulcerative colitis, unspecified, without complications: Secondary | ICD-10-CM

## 2012-06-19 LAB — CBC WITH DIFFERENTIAL/PLATELET
Basophils Absolute: 0.1 10*3/uL (ref 0.0–0.1)
Basophils Relative: 2.1 % (ref 0.0–3.0)
Eosinophils Absolute: 0.2 10*3/uL (ref 0.0–0.7)
Eosinophils Relative: 7 % — ABNORMAL HIGH (ref 0.0–5.0)
HCT: 34.8 % — ABNORMAL LOW (ref 36.0–46.0)
Hemoglobin: 11.3 g/dL — ABNORMAL LOW (ref 12.0–15.0)
Lymphocytes Relative: 41 % (ref 12.0–46.0)
Lymphs Abs: 1.3 10*3/uL (ref 0.7–4.0)
MCHC: 32.5 g/dL (ref 30.0–36.0)
MCV: 90.8 fl (ref 78.0–100.0)
Monocytes Absolute: 0.4 10*3/uL (ref 0.1–1.0)
Monocytes Relative: 13.9 % — ABNORMAL HIGH (ref 3.0–12.0)
Neutro Abs: 1.2 10*3/uL — ABNORMAL LOW (ref 1.4–7.7)
Neutrophils Relative %: 36 % — ABNORMAL LOW (ref 43.0–77.0)
Platelets: 388 10*3/uL (ref 150.0–400.0)
RBC: 3.84 Mil/uL — ABNORMAL LOW (ref 3.87–5.11)
RDW: 17.1 % — ABNORMAL HIGH (ref 11.5–14.6)
WBC: 3.2 10*3/uL — ABNORMAL LOW (ref 4.5–10.5)

## 2012-06-19 LAB — COMPREHENSIVE METABOLIC PANEL
ALT: 113 U/L — ABNORMAL HIGH (ref 0–35)
AST: 68 U/L — ABNORMAL HIGH (ref 0–37)
Albumin: 3.8 g/dL (ref 3.5–5.2)
Alkaline Phosphatase: 66 U/L (ref 39–117)
BUN: 13 mg/dL (ref 6–23)
CO2: 28 mEq/L (ref 19–32)
Calcium: 9.3 mg/dL (ref 8.4–10.5)
Chloride: 105 mEq/L (ref 96–112)
Creatinine, Ser: 0.7 mg/dL (ref 0.4–1.2)
GFR: 112.87 mL/min (ref >60.00)
Glucose, Bld: 71 mg/dL (ref 70–99)
Potassium: 4.2 mEq/L (ref 3.5–5.1)
Sodium: 140 mEq/L (ref 135–145)
Total Bilirubin: 1.3 mg/dL — ABNORMAL HIGH (ref 0.3–1.2)
Total Protein: 7.1 g/dL (ref 6.0–8.3)

## 2012-06-19 LAB — LIPASE: Lipase: 30 U/L (ref 11.0–59.0)

## 2012-06-19 NOTE — Patient Instructions (Addendum)
Your physician has requested that you go to the basement for the following lab work before leaving today: CBC, Cmet, and Lipase.  Continue Humira at 40 mg one shot every other week starting today.  Please call back and ask for [facility] with any questions.

## 2012-06-19 NOTE — Progress Notes (Signed)
History of Present Illness: This is a 57 year old female Crohn's colitis. She has made substantial improvement over the past several weeks. She is having 1-2 formed bowel movements each day with occasional minimal abdominal pain. She has rare episodes of blood in her stool. Her energy level is good. She has completed her Prednisone taper.    Current Medications, Allergies, Past Medical History, Past Surgical History, Family History and Social History were reviewed in [employer] record.   Physical Exam:  General: Well developed , well nourished, no acute distress  Head: Normocephalic and atraumatic  Eyes: sclerae anicteric, EOMI  Ears: Normal auditory acuity  Mouth: No deformity or lesions  Lungs: Clear throughout to auscultation  Heart: Regular rate and rhythm; no murmurs, rubs or bruits  Abdomen: Soft, non tender and non distended. No masses, hepatosplenomegaly or hernias noted. Normal Bowel sounds  Musculoskeletal: Symmetrical with no gross deformities  Pulses: Normal pulses noted  Extremities: No clubbing, cyanosis, edema or deformities noted  Neurological: Alert oriented x 4, grossly nonfocal  Psychological: Alert and cooperative. Normal mood and affect   Assessment and Recommendations:   1. Crohn's colitis-severe. Continue Humira and 6MP. She is clear on the correct Humira dosing at 40 mg SQ every 2 weeks with dosing today. Repeat blood work today. Return office visit for 6 weeks.

## 2012-06-22 ENCOUNTER — Telehealth: Payer: Self-pay | Admitting: Gastroenterology

## 2012-06-22 NOTE — Telephone Encounter (Signed)
Patient states her Doristine Johns is over 900 dollars and cannot afford the medicine. Patient wants to know if we can give her samples. Left several boxes out front for patient to pick up and pt notified that we have a limited amount of samples but will leave what we have. Pt agreed.

## 2012-07-24 ENCOUNTER — Other Ambulatory Visit (INDEPENDENT_AMBULATORY_CARE_PROVIDER_SITE_OTHER): Payer: Self-pay

## 2012-07-24 ENCOUNTER — Other Ambulatory Visit: Payer: Self-pay

## 2012-07-24 DIAGNOSIS — K5190 Ulcerative colitis, unspecified, without complications: Secondary | ICD-10-CM

## 2012-07-24 DIAGNOSIS — K5090 Crohn's disease, unspecified, without complications: Secondary | ICD-10-CM

## 2012-07-24 LAB — CBC WITH DIFFERENTIAL/PLATELET
Basophils Absolute: 0.1 10*3/uL (ref 0.0–0.1)
Basophils Relative: 1.9 % (ref 0.0–3.0)
Eosinophils Absolute: 0.2 10*3/uL (ref 0.0–0.7)
Eosinophils Relative: 7.1 % — ABNORMAL HIGH (ref 0.0–5.0)
HCT: 34.5 % — ABNORMAL LOW (ref 36.0–46.0)
Hemoglobin: 11.5 g/dL — ABNORMAL LOW (ref 12.0–15.0)
Lymphocytes Relative: 33.9 % (ref 12.0–46.0)
Lymphs Abs: 1.2 10*3/uL (ref 0.7–4.0)
MCHC: 33.4 g/dL (ref 30.0–36.0)
MCV: 87.8 fl (ref 78.0–100.0)
Monocytes Absolute: 0.4 10*3/uL (ref 0.1–1.0)
Monocytes Relative: 12.4 % — ABNORMAL HIGH (ref 3.0–12.0)
Neutro Abs: 1.6 10*3/uL (ref 1.4–7.7)
Neutrophils Relative %: 44.7 % (ref 43.0–77.0)
Platelets: 323 10*3/uL (ref 150.0–400.0)
RBC: 3.93 Mil/uL (ref 3.87–5.11)
RDW: 16.9 % — ABNORMAL HIGH (ref 11.5–14.6)
WBC: 3.5 10*3/uL — ABNORMAL LOW (ref 4.5–10.5)

## 2012-07-24 LAB — HEPATIC FUNCTION PANEL
ALT: 103 U/L — ABNORMAL HIGH (ref 0–35)
AST: 52 U/L — ABNORMAL HIGH (ref 0–37)
Albumin: 4 g/dL (ref 3.5–5.2)
Alkaline Phosphatase: 71 U/L (ref 39–117)
Bilirubin, Direct: 0.2 mg/dL (ref 0.0–0.3)
Total Bilirubin: 0.9 mg/dL (ref 0.3–1.2)
Total Protein: 7 g/dL (ref 6.0–8.3)

## 2012-07-24 LAB — LIPASE: Lipase: 20 U/L (ref 11.0–59.0)

## 2012-07-31 ENCOUNTER — Encounter: Payer: Self-pay | Admitting: Gastroenterology

## 2012-07-31 ENCOUNTER — Ambulatory Visit (INDEPENDENT_AMBULATORY_CARE_PROVIDER_SITE_OTHER): Admitting: Gastroenterology

## 2012-07-31 VITALS — BP 110/70 | HR 80 | Ht 63.0 in | Wt 188.2 lb

## 2012-07-31 DIAGNOSIS — R7401 Elevation of levels of liver transaminase levels: Secondary | ICD-10-CM

## 2012-07-31 DIAGNOSIS — R7402 Elevation of levels of lactic acid dehydrogenase (LDH): Secondary | ICD-10-CM

## 2012-07-31 DIAGNOSIS — K5010 Crohn's disease of large intestine without complications: Secondary | ICD-10-CM

## 2012-07-31 DIAGNOSIS — D509 Iron deficiency anemia, unspecified: Secondary | ICD-10-CM

## 2012-07-31 NOTE — Patient Instructions (Addendum)
Continue Humira.   Stop taking Lialda.   cc: Nolene Ebbs, MD

## 2012-07-31 NOTE — Progress Notes (Signed)
History of Present Illness: This is a 57 year old female returning for followup of Crohn's colitis. At this point she has no gastrointestinal complaints. She notes her energy level is not quite back to normal but it is close. Her white blood cell count has been 3.2 and 3.5 on her last 2 CBCs. Liver function tests have improved. The rest of her blood work has been stable.  Current Medications, Allergies, Past Medical History, Past Surgical History, Family History and Social History were reviewed in [employer] record.  Physical Exam: General: Well developed , well nourished, no acute distress Head: Normocephalic and atraumatic Eyes:  sclerae anicteric, EOMI Ears: Normal auditory acuity Mouth: No deformity or lesions Lungs: Clear throughout to auscultation Heart: Regular rate and rhythm; no murmurs, rubs or bruits Abdomen: Soft, non tender and non distended. No masses, hepatosplenomegaly or hernias noted. Normal Bowel sounds Musculoskeletal: Symmetrical with no gross deformities  Pulses:  Normal pulses noted Extremities: No clubbing, cyanosis, edema or deformities noted Neurological: Alert oriented x 4, grossly nonfocal Psychological:  Alert and cooperative. Normal mood and affect  Assessment and Recommendations:  1. Crohn's colitis-severe, substantially improved.. Continue Humira and 6MP at current dosages and schedules. DC Lialda. Her mild leukopenia may be secondary 6-MP. Repeat blood work today. Return office visit in 8 weeks.  2. Elevated transaminases. Slightly improved on last hepatic panel. Viral hepatitis serologies negative. If her LFTs fail to improve we will obtain all standard serologies.  3. Cholelithiasis. Asymptomatic   4. Iron deficiency anemia. Begin iron replacement.

## 2012-08-01 ENCOUNTER — Telehealth: Payer: Self-pay

## 2012-08-01 ENCOUNTER — Other Ambulatory Visit: Payer: Self-pay | Admitting: Gastroenterology

## 2012-08-01 MED ORDER — FERROUS SULFATE 325 (65 FE) MG PO TABS: 325.0000 mg | ORAL_TABLET | Freq: Two times a day (BID) | ORAL | Status: DC

## 2012-08-01 NOTE — Progress Notes (Signed)
Patient notified that she is Iron deficient again and needs to start Iron supplement. Pt agreed and will go by [facility] to pick up prescription.

## 2012-08-01 NOTE — Telephone Encounter (Signed)
Message copied by Marzella Schlein on Tue Aug 01, 2012  4:10 PM ------      Message from: Lucio Edward T      Created: Tue Aug 01, 2012  1:27 PM       Thx for the reminder. Hair loss is reported for Humira and 6MP. Nothing I can find about hair growth for either medication.       Prednisone might cause hair growth but she is off that now. Maybe another medication and she should check with the prescribing MD.                  ----- Message -----         From: Marzella Schlein, CMA         Sent: 08/01/2012   1:15 PM           To: Ladene Artist, MD,FACG            Did you ever get a chance to look up Humira and new hair growth? I called Mrs. Guerrier about her iron supplement and she asked me again about this problem and if it was Humira. I told her I would remind you.        ------

## 2012-08-01 NOTE — Telephone Encounter (Signed)
Notified patient that it could of came from prednisone. Pt agreed and said it did start around the same time she was taking it. Pt thanked [name] and will make sure its not another medication she is taking.

## 2012-08-11 ENCOUNTER — Other Ambulatory Visit: Payer: Self-pay | Admitting: Gastroenterology

## 2012-09-01 ENCOUNTER — Other Ambulatory Visit: Payer: Self-pay | Admitting: Gastroenterology

## 2012-09-07 ENCOUNTER — Encounter: Payer: Self-pay | Admitting: Women's Health

## 2012-09-07 ENCOUNTER — Ambulatory Visit (INDEPENDENT_AMBULATORY_CARE_PROVIDER_SITE_OTHER): Admitting: Women's Health

## 2012-09-07 VITALS — BP 130/65 | Ht 64.0 in | Wt 185.0 lb

## 2012-09-07 DIAGNOSIS — Z01419 Encounter for gynecological examination (general) (routine) without abnormal findings: Secondary | ICD-10-CM

## 2012-09-07 DIAGNOSIS — Z833 Family history of diabetes mellitus: Secondary | ICD-10-CM

## 2012-09-07 DIAGNOSIS — N879 Dysplasia of cervix uteri, unspecified: Secondary | ICD-10-CM

## 2012-09-07 DIAGNOSIS — L309 Dermatitis, unspecified: Secondary | ICD-10-CM

## 2012-09-07 DIAGNOSIS — L259 Unspecified contact dermatitis, unspecified cause: Secondary | ICD-10-CM

## 2012-09-07 DIAGNOSIS — Z780 Asymptomatic menopausal state: Secondary | ICD-10-CM

## 2012-09-07 DIAGNOSIS — E079 Disorder of thyroid, unspecified: Secondary | ICD-10-CM

## 2012-09-07 DIAGNOSIS — Z13220 Encounter for screening for lipoid disorders: Secondary | ICD-10-CM

## 2012-09-07 LAB — CBC WITH DIFFERENTIAL/PLATELET
Basophils Absolute: 0 10*3/uL (ref 0.0–0.1)
Basophils Relative: 1 % (ref 0–1)
Eosinophils Absolute: 0.1 10*3/uL (ref 0.0–0.7)
Eosinophils Relative: 4 % (ref 0–5)
HCT: 35.8 % — ABNORMAL LOW (ref 36.0–46.0)
Hemoglobin: 12.2 g/dL (ref 12.0–15.0)
Lymphocytes Relative: 42 % (ref 12–46)
Lymphs Abs: 1.3 10*3/uL (ref 0.7–4.0)
MCH: 29.1 pg (ref 26.0–34.0)
MCHC: 34.1 g/dL (ref 30.0–36.0)
MCV: 85.4 fL (ref 78.0–100.0)
Monocytes Absolute: 0.3 10*3/uL (ref 0.1–1.0)
Monocytes Relative: 9 % (ref 3–12)
Neutro Abs: 1.4 10*3/uL — ABNORMAL LOW (ref 1.7–7.7)
Neutrophils Relative %: 44 % (ref 43–77)
Platelets: 282 10*3/uL (ref 150–400)
RBC: 4.19 MIL/uL (ref 3.87–5.11)
RDW: 18.2 % — ABNORMAL HIGH (ref 11.5–15.5)
WBC: 3.1 10*3/uL — ABNORMAL LOW (ref 4.0–10.5)

## 2012-09-07 LAB — LIPID PANEL
Cholesterol: 209 mg/dL — ABNORMAL HIGH (ref 0–200)
HDL: 68 mg/dL (ref >39)
LDL Cholesterol: 125 mg/dL — ABNORMAL HIGH (ref 0–99)
Total CHOL/HDL Ratio: 3.1 Ratio
Triglycerides: 82 mg/dL (ref <150)
VLDL: 16 mg/dL (ref 0–40)

## 2012-09-07 LAB — TSH: TSH: 3.268 u[IU]/mL (ref 0.350–4.500)

## 2012-09-07 LAB — GLUCOSE, RANDOM: Glucose, Bld: 84 mg/dL (ref 70–99)

## 2012-09-07 MED ORDER — BETAMETHASONE VALERATE 0.1 % EX OINT: TOPICAL_OINTMENT | Freq: Two times a day (BID) | CUTANEOUS | Status: DC

## 2012-09-07 NOTE — Progress Notes (Signed)
KEYONNA COMUNALE 06/06/1955 [id number]    History:    The patient presents for annual exam.  Postmenopausal on no HRT. TAH for fibroids. History of hyperthyroidism treated I-131, normal thyroid function after. Crohn's disease diagnosed 02/2012 treated by Dr. Fuller Plan with Remicade doing better. Normal DEXA last DEXA 2009. T score bilateral hip average -0.5, AP spine 0.4. History of negative breast biopsy in the 80s, 2000. Normal Pap and mammogram history. Mammogram overdue. Requested labs today will take to primary care, annual exam next month. Had pneumonia vaccine 2013   Past medical history, past surgical history, family history and social history were all reviewed and documented in the EPIC chart. Does prison ministry. 3 grown children all doing well. Father, sisters, brother-diabetes brothers, sister-hypertension.   ROS:  A  ROS was performed and pertinent positives and negatives are included in the history.  Exam:  Filed Vitals:   09/07/12 0905  BP: 130/65    General appearance:  Normal Head/Neck:  Normal, without cervical or supraclavicular adenopathy. Thyroid:  Symmetrical, normal in size, without palpable masses or nodularity. Respiratory  Effort:  Normal  Auscultation:  Clear without wheezing or rhonchi Cardiovascular  Auscultation:  Regular rate, without rubs, murmurs or gallops  Edema/varicosities:  Not grossly evident Abdominal  Soft,nontender, without masses, guarding or rebound.  Liver/spleen:  No organomegaly noted  Hernia:  None appreciated  Skin  Inspection:  Grossly normal  Palpation:  Grossly normal Neurologic/psychiatric  Orientation:  Normal with appropriate conversation.  Mood/affect:  Normal  Genitourinary    Breasts: Examined lying and sitting/pendulous.     Right: Without masses, retractions, discharge or axillary adenopathy.     Left: Without masses, retractions, discharge or axillary adenopathy.   Inguinal/mons:  Normal without inguinal  adenopathy  External genitalia:  Normal  BUS/Urethra/Skene's glands:  Normal  Bladder:  Normal  Vagina:  atrophic  Cervix:  absent  Uterus: absent  Adnexa/parametria:     Rt: Without masses or tenderness.   Lt: Without masses or tenderness.  Anus and perineum: Normal  Digital rectal exam: Normal sphincter tone without palpated masses or tenderness  Assessment/Plan:  57 y.o. MBF G3 P3 for annual exam.  Ulcerative colitis/Crohn's diagnosed 02/2012-Dr. Fuller Plan Remicade and prednisone  TAH-fibroids Normal DEXA 2009 Vaginal atrophy Asthma/anemia-primary care  Plan: Repeat DEXA this year, will schedule here. SBE's, instructed to schedule, reviewed importance of annual screen. Reviewed importance of increasing regular exercise, daily walk, weightbearing exercises, home safety and fall prevention discussed. Vaginal lubricants with intercourse, does not want to use long term HRT, sample of Premarin vaginal cream given, will call if would like to continue. CBC, glucose, lipid panel, TSH, UA, Pap screening guidelines reviewed. Valisone cream to small patch of eczema on lower abdomen, will followup with dermatologist if no relief.   Huel Cote [facility], 9:42 AM 09/07/2012

## 2012-09-07 NOTE — Patient Instructions (Addendum)
Health Recommendations for Postmenopausal Women Based on the Results of the [facility]) and Other Studies The WHI is a major 15-year research program to address the most common causes of death, disability and poor quality of life in postmenopausal women. Some of these causes are heart disease, cancer, bone loss (osteoporosis) and others. Taking into account all of the findings from [facility] and other studies, here are bottom-line health recommendations for women: CARDIOVASCULAR DISEASE Heart Disease: A heart attack is a medical emergency. Know the signs and symptoms of a heart attack. Hormone therapy should not be used to prevent heart disease. In women with heart disease, hormone therapy should not be used to prevent further disease. Hormone therapy increases the risk of blood clots. Below are things women can do to reduce their risk for heart disease.   Do not smoke. If you smoke, quit. Women who smoke are 2 to 6 times more likely to suffer a heart attack than non-smoking women.  Aim for a healthy weight. Being overweight causes many preventable deaths. Eat a healthy and balanced diet and drink an adequate amount of liquids.  Get moving. Make a commitment to be more physically active. Aim for 30 minutes of activity on most, if not all days of the week.  Eat for heart health. Choose a diet that is low in saturated fat, trans fat, and cholesterol. Include whole grains, vegetables, and fruits. Read the labels on the food container before buying it.  Know your numbers. Ask your caregiver to check your blood pressure, cholesterol (total, HDL, LDL, triglycerides) and blood glucose. Work with your caregiver to improve any numbers that are not normal.  High blood pressure. Limit or stop your table salt intake (try salt substitute and food seasonings), avoid salty foods and drinks. Read the labels on the food container before buying it. Avoid becoming overweight by eating well and  exercising. STROKE  Stroke is a medical emergency. Stroke can be the result of a blood clot in the blood vessel in the brain or by a brain hemorrhage (bleeding). Know the signs and symptoms of a stroke. To lower the risk of developing a stroke:  Avoid fatty foods.  Quit smoking.  Control your diabetes, blood pressure, and irregular heart rate. THROMBOPHLIBITIS (BLOOD CLOT) OF THE LEG  Hormone treatment is a big cause of developing blood clots in the leg. Becoming overweight and leading a stationary lifestyle also may contribute to developing blood clots. Controlling your diet and exercising will help lower the risk of developing blood clots. CANCER SCREENING  Breast Cancer: Women should take steps to reduce their risk of breast cancer. This includes having regular mammograms, monthly self breast exams and regular breast exams by your caregiver. Have a mammogram every one to two years if you are 3 to 57 years old. Have a mammogram annually if you are 67 years old or older depending on your risk factors. Women who are high risk for breast cancer may need more frequent mammograms. There are tests available (testing the genes in your body) if you have family history of breast cancer called BRCA 1 and 2. These tests can help determine the risks of developing breast cancer.  Intestinal or Stomach Cancer: Women should talk to their caregiver about when to start screening, what tests and how often they should be done, and the benefits and risks of doing these tests. Tests to consider are a rectal exam, fecal occult blood, sigmoidoscopy, colononoscoby, barium enema and upper GI series  of the stomach. Depending on the age, you may want to get a medical and family history of colon cancer. Women who are high risk may need to be screened at an earlier age and more often.  Cervical Cancer: A Pap test of the cervix should be done every year and every 3 years when there has been three straight years of a normal  Pap test. Women with an abnormal Pap test should be screened more often or have a cervical biopsy depending on your caregiver's recommendation.  Uterine Cancer: If you have vaginal bleeding after you are in the menopause, it should be evaluated by your caregiver.  Ovarian cancer: There are no reliable tests available to screen for ovarian cancer at this time except for yearly pelvic exams.  Lung Cancer: Yearly chest X-rays can detect lung cancer and should be done on high risk women, such as cigarette smokers and women with chronic lung disease (emphysemia).  Skin Cancer: A complete body skin exam should be done at your yearly examination. Avoid overexposure to the sun and ultraviolet light lamps. Use a strong sun block cream when in the sun. All of these things are important in lowering the risk of skin cancer. MENOPAUSE Menopause Symptoms: Hormone therapy products are effective for treating symptoms associated with menopause:  Moderate to severe hot flashes.  Night sweats.  Mood swings.  Headaches.  Tiredness.  Loss of sex drive.  Insomnia.  Other symptoms. However, hormone therapy products carry serious risks, especially in older women. Women who use or are thinking about using estrogen or estrogen with progestin treatments should discuss that with their caregiver. Your caregiver will know if the benefits outweigh the risks. The Food and Drug Administration (FDA) has concluded that hormone therapy should be used only at the lowest doses and for the shortest amount of time to reach treatment goals. It is not known at what doses there may be less risk of serious side effects. There are other treatments such as herbal medication (not controlled or regulated by the FDA), group therapy, counseling and acupuncture that may be helpful. OSTEOPOROSIS Protecting Against Bone Loss and Preventing Fracture: If hormone therapy is used for prevention of bone loss (osteoporosis), the risks for bone  loss must outweigh the risk of the therapy. Women considering taking hormone therapy for bone loss should ask their health care providers about other medications (fosamax and boniva) that are considered safe and effective for preventing bone loss and bone fractures. To guard against bone loss or fractures, it is recommended that women should take at least 1000-1500 mg of calcium and 400-800 IU of vitamin D daily in divided doses. Smoking and excessive alcohol intake increases the risk of osteoporosis. Eat foods rich in calcium and vitamin D and do weight bearing exercises several times a week as your caregiver suggests. DIABETES Diabetes Melitus: Women with Type I or Type 2 diabetes should keep their diabetes in control with diet, exercise and medication. Avoid too many sweets, starchy and fatty foods. Being overweight can affect your diabetes. COGNITION AND MEMORY Cognition and Memory: Menopausal hormone therapy is not recommended for the prevention of cognitive disorders such as Alzheimer's disease or memory loss. WHI found that women treated with hormone therapy have a greater risk of developing dementia.  DEPRESSION  Depression may occur at any age, but is common in elderly women. The reasons may be because of physical, medical, social (loneliness), financial and/or economic problems and needs. Becoming involved with church, volunteer or social groups,  seeking treatment for any physical or medical problems is recommended. Also, look into getting professional advice for any economic or financial problems. ACCIDENTS  Accidents are common and can be serious in the elderly woman. Prepare your house to prevent accidents. Eliminate throw rugs, use hip protectors, place hand bars in the bath, shower and toilet areas. Avoid wearing high heel shoes and walking on wet, snowy and icy areas. Stop driving if you have vision, hearing problems or are unsteady with you movements and reflexes. RHEUMATOID  ARTHRITIS Rheumatoid arthritis causes pain, swelling and stiffness of your bone joints. It can limit many of your activities. Over-the-counter medications may help, but prescription medications may be necessary. Talk with your caregiver about this. Exercise (walking, water aerobics), good posture, using splints on painful joints, warm baths or applying warm compresses to stiff joints and cold compresses to painful joints may be helpful. Smoking and excessive drinking may worsen the symptoms of arthritis. Seek help from a physical therapist if the arthritis is becoming a problem with your daily activities. IMMUNIZATIONS  Several immunizations are important to have during your senior years, including:   Tetanus and a diptheria shot booster every 10 years.  Influenza every year before the flu season begins.  Pneumonia vaccine.  Shingles vaccine.  Others as indicated (example: H1N1 vaccine). Document Released: 07/09/2005 Document Revised: 08/09/2011 Document Reviewed: 03/04/2008 [facility] Patient Information 2013 [place].

## 2012-09-08 LAB — URINALYSIS W MICROSCOPIC + REFLEX CULTURE
Bacteria, UA: NONE SEEN
Bilirubin Urine: NEGATIVE
Casts: NONE SEEN
Crystals: NONE SEEN
Glucose, UA: NEGATIVE mg/dL
Hgb urine dipstick: NEGATIVE
Ketones, ur: NEGATIVE mg/dL
Leukocytes, UA: NEGATIVE
Nitrite: NEGATIVE
Protein, ur: NEGATIVE mg/dL
Specific Gravity, Urine: 1.006 (ref 1.005–1.030)
Squamous Epithelial / LPF: NONE SEEN
Urobilinogen, UA: 0.2 mg/dL (ref 0.0–1.0)
pH: 6 (ref 5.0–8.0)

## 2012-09-14 ENCOUNTER — Encounter: Payer: Self-pay | Admitting: Obstetrics and Gynecology

## 2012-10-03 ENCOUNTER — Encounter: Payer: Self-pay | Admitting: Gastroenterology

## 2012-10-03 ENCOUNTER — Ambulatory Visit (INDEPENDENT_AMBULATORY_CARE_PROVIDER_SITE_OTHER): Admitting: Gastroenterology

## 2012-10-03 VITALS — BP 100/70 | HR 84 | Ht 63.0 in | Wt 182.0 lb

## 2012-10-03 DIAGNOSIS — K5010 Crohn's disease of large intestine without complications: Secondary | ICD-10-CM

## 2012-10-03 DIAGNOSIS — K219 Gastro-esophageal reflux disease without esophagitis: Secondary | ICD-10-CM

## 2012-10-03 DIAGNOSIS — D509 Iron deficiency anemia, unspecified: Secondary | ICD-10-CM

## 2012-10-03 MED ORDER — OMEPRAZOLE 40 MG PO CPDR: 40.0000 mg | DELAYED_RELEASE_CAPSULE | Freq: Every day | ORAL | Status: DC | PRN

## 2012-10-03 NOTE — Progress Notes (Signed)
History of Present Illness: This is a  57 year old female with Crohn's colitis. She is maintained on Humira and 6 MP and is completely asymptomatic. Blood work performed her chronic low gynecologist office was unremarkable however liver tests were not performed. Her anemia has corrected on iron. She feels very well and has no gastrointestinal complaints.  Current Medications, Allergies, Past Medical History, Past Surgical History, Family History and Social History were reviewed in [employer] record.  Physical Exam: General: Well developed , well nourished, no acute distress Head: Normocephalic and atraumatic Eyes:  sclerae anicteric, EOMI Ears: Normal auditory acuity Mouth: No deformity or lesions Lungs: Clear throughout to auscultation Heart: Regular rate and rhythm; no murmurs, rubs or bruits Abdomen: Soft, non tender and non distended. No masses, hepatosplenomegaly or hernias noted. Normal Bowel sounds Musculoskeletal: Symmetrical with no gross deformities  Pulses:  Normal pulses noted Extremities: No clubbing, cyanosis, edema or deformities noted Neurological: Alert oriented x 4, grossly nonfocal Psychological:  Alert and cooperative. Normal mood and affect  Assessment and Recommendations:  1. Crohn's colitis-severe, currently asymptomatic. Continue Humira and 6MP at current dosages and schedules. No change in management. Return office visit in 12 weeks.   2. Elevated transaminases. Slightly improved on last hepatic panel. Viral hepatitis serologies negative. If her LFTs fail to improve we will obtain all standard serologies.   3. Cholelithiasis. Asymptomatic   4. Iron deficiency anemia. Corrected on iron replacement. Continue iron for one more month and then may discontinue.   5. GERD. Continue standard antireflux measures and omeprazole 40 mg daily.

## 2012-10-03 NOTE — Patient Instructions (Addendum)
We have sent the following medications to your pharmacy for you to pick up at your convenience: Omeprazole.  cc: Nolene Ebbs, MD

## 2012-10-05 ENCOUNTER — Encounter

## 2012-10-05 ENCOUNTER — Other Ambulatory Visit: Payer: Self-pay | Admitting: Gynecology

## 2012-10-05 DIAGNOSIS — Z780 Asymptomatic menopausal state: Secondary | ICD-10-CM

## 2012-10-10 ENCOUNTER — Ambulatory Visit (INDEPENDENT_AMBULATORY_CARE_PROVIDER_SITE_OTHER)

## 2012-10-10 DIAGNOSIS — M8580 Other specified disorders of bone density and structure, unspecified site: Secondary | ICD-10-CM

## 2012-10-10 DIAGNOSIS — Z780 Asymptomatic menopausal state: Secondary | ICD-10-CM

## 2012-10-10 DIAGNOSIS — M899 Disorder of bone, unspecified: Secondary | ICD-10-CM

## 2012-10-11 ENCOUNTER — Other Ambulatory Visit: Payer: Self-pay | Admitting: Gynecology

## 2012-10-11 DIAGNOSIS — M8580 Other specified disorders of bone density and structure, unspecified site: Secondary | ICD-10-CM

## 2012-10-12 ENCOUNTER — Encounter: Payer: Self-pay | Admitting: Gynecology

## 2012-10-19 ENCOUNTER — Other Ambulatory Visit: Payer: Self-pay | Admitting: *Deleted

## 2012-10-19 DIAGNOSIS — M8580 Other specified disorders of bone density and structure, unspecified site: Secondary | ICD-10-CM

## 2012-10-24 ENCOUNTER — Other Ambulatory Visit

## 2012-10-24 DIAGNOSIS — M8580 Other specified disorders of bone density and structure, unspecified site: Secondary | ICD-10-CM

## 2012-10-25 LAB — PTH, INTACT AND CALCIUM
Calcium: 9.4 mg/dL (ref 8.4–10.5)
PTH: 38.3 pg/mL (ref 14.0–72.0)

## 2012-10-25 LAB — VITAMIN D 25 HYDROXY (VIT D DEFICIENCY, FRACTURES): Vit D, 25-Hydroxy: 56 ng/mL (ref 30–89)

## 2012-10-30 ENCOUNTER — Encounter: Payer: Self-pay | Admitting: Gynecology

## 2012-10-30 ENCOUNTER — Ambulatory Visit (INDEPENDENT_AMBULATORY_CARE_PROVIDER_SITE_OTHER): Admitting: Gynecology

## 2012-10-30 DIAGNOSIS — M899 Disorder of bone, unspecified: Secondary | ICD-10-CM

## 2012-10-30 DIAGNOSIS — M8580 Other specified disorders of bone density and structure, unspecified site: Secondary | ICD-10-CM

## 2012-10-30 NOTE — Progress Notes (Signed)
Patient presents to discuss her recent DEXA which shows T score -2.2 at the AP spine. Decrease of 24% from her 2009 study. FRAX 4.9%/0.4%. Had been on oral steroids due to Crohn's disease but this was stopped in March 2014. Vitamin D level, TSH and PTH all normal. Being treated for her Crohn's now with mercaptopurine and Humira. I reviewed her study to include the significant decline in the spine. She no longer takes oral steroids. Options for treatment now to include bisphosphate versus observation with repeat study in 2 years discussed. Side effect profile and risks to include osteonecrosis of the jaw, atypical fractures, GERD esophageal cancer reviewed. Certainly she discussion the patient prefers observation and understands and accepts the risk of progressive loss and we'll plan on repeating her bone density in 2 years.

## 2012-10-30 NOTE — Patient Instructions (Signed)
Maximize your calcium and vitamin D intake. We'll plan on repeating the bone density in 2 years.

## 2012-11-01 ENCOUNTER — Other Ambulatory Visit: Payer: Self-pay | Admitting: Gastroenterology

## 2012-11-03 ENCOUNTER — Other Ambulatory Visit: Payer: Self-pay

## 2012-11-03 DIAGNOSIS — Z1231 Encounter for screening mammogram for malignant neoplasm of breast: Secondary | ICD-10-CM

## 2012-12-05 ENCOUNTER — Other Ambulatory Visit: Payer: Self-pay | Admitting: Gastroenterology

## 2012-12-08 ENCOUNTER — Ambulatory Visit
Admission: RE | Admit: 2012-12-08 | Discharge: 2012-12-08 | Disposition: A | Discharge status: Home / Self Care | Source: Ambulatory Visit

## 2012-12-08 DIAGNOSIS — Z1231 Encounter for screening mammogram for malignant neoplasm of breast: Secondary | ICD-10-CM

## 2012-12-13 ENCOUNTER — Other Ambulatory Visit: Payer: Self-pay | Admitting: Gynecology

## 2012-12-13 DIAGNOSIS — R928 Other abnormal and inconclusive findings on diagnostic imaging of breast: Secondary | ICD-10-CM

## 2012-12-22 ENCOUNTER — Ambulatory Visit
Admission: RE | Admit: 2012-12-22 | Discharge: 2012-12-22 | Disposition: A | Discharge status: Home / Self Care | Source: Ambulatory Visit | Attending: Gynecology | Admitting: Gynecology

## 2012-12-22 DIAGNOSIS — R928 Other abnormal and inconclusive findings on diagnostic imaging of breast: Secondary | ICD-10-CM

## 2013-01-08 ENCOUNTER — Ambulatory Visit (INDEPENDENT_AMBULATORY_CARE_PROVIDER_SITE_OTHER): Admitting: Gastroenterology

## 2013-01-08 ENCOUNTER — Encounter: Payer: Self-pay | Admitting: Gastroenterology

## 2013-01-08 ENCOUNTER — Other Ambulatory Visit (INDEPENDENT_AMBULATORY_CARE_PROVIDER_SITE_OTHER)

## 2013-01-08 VITALS — BP 100/70 | HR 80 | Ht 64.0 in | Wt 181.1 lb

## 2013-01-08 DIAGNOSIS — K5090 Crohn's disease, unspecified, without complications: Secondary | ICD-10-CM

## 2013-01-08 DIAGNOSIS — D509 Iron deficiency anemia, unspecified: Secondary | ICD-10-CM

## 2013-01-08 DIAGNOSIS — K50118 Crohn's disease of large intestine with other complication: Secondary | ICD-10-CM

## 2013-01-08 DIAGNOSIS — K5010 Crohn's disease of large intestine without complications: Secondary | ICD-10-CM | POA: Insufficient documentation

## 2013-01-08 LAB — COMPREHENSIVE METABOLIC PANEL
ALT: 39 U/L — ABNORMAL HIGH (ref 0–35)
AST: 26 U/L (ref 0–37)
Albumin: 4.3 g/dL (ref 3.5–5.2)
Alkaline Phosphatase: 69 U/L (ref 39–117)
BUN: 14 mg/dL (ref 6–23)
CO2: 28 mEq/L (ref 19–32)
Calcium: 9.6 mg/dL (ref 8.4–10.5)
Chloride: 110 mEq/L (ref 96–112)
Creatinine, Ser: 0.7 mg/dL (ref 0.4–1.2)
GFR: 112.65 mL/min (ref >60.00)
Glucose, Bld: 85 mg/dL (ref 70–99)
Potassium: 4.1 mEq/L (ref 3.5–5.1)
Sodium: 144 mEq/L (ref 135–145)
Total Bilirubin: 1.2 mg/dL (ref 0.3–1.2)
Total Protein: 7.5 g/dL (ref 6.0–8.3)

## 2013-01-08 LAB — CBC WITH DIFFERENTIAL/PLATELET
Basophils Absolute: 0 10*3/uL (ref 0.0–0.1)
Basophils Relative: 1.7 % (ref 0.0–3.0)
Eosinophils Absolute: 0.2 10*3/uL (ref 0.0–0.7)
Eosinophils Relative: 6 % — ABNORMAL HIGH (ref 0.0–5.0)
HCT: 37.8 % (ref 36.0–46.0)
Hemoglobin: 12.9 g/dL (ref 12.0–15.0)
Lymphocytes Relative: 45.5 % (ref 12.0–46.0)
Lymphs Abs: 1.2 10*3/uL (ref 0.7–4.0)
MCHC: 34 g/dL (ref 30.0–36.0)
MCV: 95.7 fl (ref 78.0–100.0)
Monocytes Absolute: 0.3 10*3/uL (ref 0.1–1.0)
Monocytes Relative: 10.7 % (ref 3.0–12.0)
Neutro Abs: 1 10*3/uL — ABNORMAL LOW (ref 1.4–7.7)
Neutrophils Relative %: 36.1 % — ABNORMAL LOW (ref 43.0–77.0)
Platelets: 275 10*3/uL (ref 150.0–400.0)
RBC: 3.96 Mil/uL (ref 3.87–5.11)
RDW: 13.7 % (ref 11.5–14.6)
WBC: 2.7 10*3/uL — ABNORMAL LOW (ref 4.5–10.5)

## 2013-01-08 LAB — HEPATIC FUNCTION PANEL
ALT: 39 U/L — ABNORMAL HIGH (ref 0–35)
AST: 26 U/L (ref 0–37)
Albumin: 4.3 g/dL (ref 3.5–5.2)
Alkaline Phosphatase: 69 U/L (ref 39–117)
Bilirubin, Direct: 0.1 mg/dL (ref 0.0–0.3)
Total Bilirubin: 1.2 mg/dL (ref 0.3–1.2)
Total Protein: 7.5 g/dL (ref 6.0–8.3)

## 2013-01-08 LAB — LIPASE: Lipase: 19 U/L (ref 11.0–59.0)

## 2013-01-08 NOTE — Patient Instructions (Addendum)
Your physician has requested that you go to the basement for the following lab work before leaving today: CBC, Lipase and Cmet.   Thank you for choosing me and [facility].  Pricilla Riffle. Dagoberto Ligas., MD., Marval Regal

## 2013-01-08 NOTE — Progress Notes (Signed)
History of Present Illness: This is a 57 year old female with Crohn's colitis he continues to do extremely well on Humira and 6 MP. The only symptom she notes there is mild nausea that bothers her for 1-2 weeks before her Humira injection and it resolved promptly after Humira.  Current Medications, Allergies, Past Medical History, Past Surgical History, Family History and Social History were reviewed in [employer] record.  Physical Exam: General: Well developed , well nourished, no acute distress Head: Normocephalic and atraumatic Eyes:  sclerae anicteric, EOMI Ears: Normal auditory acuity Mouth: No deformity or lesions Lungs: Clear throughout to auscultation Heart: Regular rate and rhythm; no murmurs, rubs or bruits Abdomen: Soft, non tender and non distended. No masses, hepatosplenomegaly or hernias noted. Normal Bowel sounds Musculoskeletal: Symmetrical with no gross deformities  Pulses:  Normal pulses noted Extremities: No clubbing, cyanosis, edema or deformities noted Neurological: Alert oriented x 4, grossly nonfocal Psychological:  Alert and cooperative. Normal mood and affect  Assessment and Recommendations:  1. Crohn's colitis-severe, currently asymptomatic. Continue Humira and 6MP at current dosages and schedules. No change in management. Return office visit in 3 months.   2. Elevated transaminases. Slightly improved on last hepatic panel. Viral hepatitis serologies negative. If her LFTs fail to improve we will obtain all standard serologies.   3. Cholelithiasis. Asymptomatic.  4. Iron deficiency anemia. Corrected on iron replacement. Continue iron for one more month and then may discontinue.   5. GERD. Continue standard antireflux measures and omeprazole 40 mg daily.

## 2013-04-02 ENCOUNTER — Other Ambulatory Visit: Payer: Self-pay | Admitting: Gastroenterology

## 2013-04-11 ENCOUNTER — Ambulatory Visit (INDEPENDENT_AMBULATORY_CARE_PROVIDER_SITE_OTHER): Admitting: Gastroenterology

## 2013-04-11 ENCOUNTER — Other Ambulatory Visit: Payer: Self-pay

## 2013-04-11 ENCOUNTER — Telehealth: Payer: Self-pay | Admitting: Gastroenterology

## 2013-04-11 ENCOUNTER — Other Ambulatory Visit (INDEPENDENT_AMBULATORY_CARE_PROVIDER_SITE_OTHER)

## 2013-04-11 ENCOUNTER — Encounter: Payer: Self-pay | Admitting: Gastroenterology

## 2013-04-11 VITALS — BP 108/70 | HR 68 | Ht 64.0 in | Wt 178.4 lb

## 2013-04-11 DIAGNOSIS — K5010 Crohn's disease of large intestine without complications: Secondary | ICD-10-CM

## 2013-04-11 DIAGNOSIS — R7401 Elevation of levels of liver transaminase levels: Secondary | ICD-10-CM

## 2013-04-11 DIAGNOSIS — Z23 Encounter for immunization: Secondary | ICD-10-CM

## 2013-04-11 DIAGNOSIS — R7402 Elevation of levels of lactic acid dehydrogenase (LDH): Secondary | ICD-10-CM

## 2013-04-11 LAB — COMPREHENSIVE METABOLIC PANEL
ALT: 57 U/L — ABNORMAL HIGH (ref 0–35)
AST: 35 U/L (ref 0–37)
Albumin: 4.3 g/dL (ref 3.5–5.2)
Alkaline Phosphatase: 74 U/L (ref 39–117)
BUN: 16 mg/dL (ref 6–23)
CO2: 28 mEq/L (ref 19–32)
Calcium: 9.7 mg/dL (ref 8.4–10.5)
Chloride: 106 mEq/L (ref 96–112)
Creatinine, Ser: 0.7 mg/dL (ref 0.4–1.2)
GFR: 110.7 mL/min (ref >60.00)
Glucose, Bld: 86 mg/dL (ref 70–99)
Potassium: 4.2 mEq/L (ref 3.5–5.1)
Sodium: 140 mEq/L (ref 135–145)
Total Bilirubin: 1.4 mg/dL — ABNORMAL HIGH (ref 0.3–1.2)
Total Protein: 7.4 g/dL (ref 6.0–8.3)

## 2013-04-11 LAB — CBC WITH DIFFERENTIAL/PLATELET
Basophils Absolute: 0 10*3/uL (ref 0.0–0.1)
Basophils Relative: 1.3 % (ref 0.0–3.0)
Eosinophils Absolute: 0.1 10*3/uL (ref 0.0–0.7)
Eosinophils Relative: 5.1 % — ABNORMAL HIGH (ref 0.0–5.0)
HCT: 37.4 % (ref 36.0–46.0)
Hemoglobin: 12.7 g/dL (ref 12.0–15.0)
Lymphocytes Relative: 43.9 % (ref 12.0–46.0)
Lymphs Abs: 1.3 10*3/uL (ref 0.7–4.0)
MCHC: 34 g/dL (ref 30.0–36.0)
MCV: 95.1 fl (ref 78.0–100.0)
Monocytes Absolute: 0.3 10*3/uL (ref 0.1–1.0)
Monocytes Relative: 9.9 % (ref 3.0–12.0)
Neutro Abs: 1.2 10*3/uL — ABNORMAL LOW (ref 1.4–7.7)
Neutrophils Relative %: 39.8 % — ABNORMAL LOW (ref 43.0–77.0)
Platelets: 250 10*3/uL (ref 150.0–400.0)
RBC: 3.94 Mil/uL (ref 3.87–5.11)
RDW: 13.8 % (ref 11.5–14.6)
WBC: 2.9 10*3/uL — ABNORMAL LOW (ref 4.5–10.5)

## 2013-04-11 LAB — LIPASE: Lipase: 25 U/L (ref 11.0–59.0)

## 2013-04-11 MED ORDER — MERCAPTOPURINE 50 MG PO TABS: ORAL_TABLET | ORAL | Status: DC

## 2013-04-11 MED ORDER — OMEPRAZOLE 40 MG PO CPDR: 40.0000 mg | DELAYED_RELEASE_CAPSULE | Freq: Every day | ORAL | Status: DC

## 2013-04-11 NOTE — Patient Instructions (Addendum)
Your physician has requested that you go to the basement for the following lab work before leaving today: Cmet, CBC and Lipase.  We have sent the following medications to your pharmacy for you to pick up at your convenience: Mercaptopurine and omeprazole 90 day supply.  We have given you a Hepatitis B injection today. Your next one is scheduled on 05/11/13 at 8:30am. Please call if you need to reschedule or cancel.  Thank you for choosing me and [facility].  Pricilla Riffle. Dagoberto Ligas., MD., Marval Regal

## 2013-04-11 NOTE — Progress Notes (Signed)
    History of Present Illness: This is a 57 year old female with Crohn's colitis maintained on [employer] and 6 MP. She is no gastrointestinal complaints.  Current Medications, Allergies, Past Medical History, Past Surgical History, Family History and Social History were reviewed in [employer] record.  Physical Exam: General: Well developed , well nourished, no acute distress Head: Normocephalic and atraumatic Eyes:  sclerae anicteric, EOMI Ears: Normal auditory acuity Mouth: No deformity or lesions Lungs: Clear throughout to auscultation Heart: Regular rate and rhythm; no murmurs, rubs or bruits Abdomen: Soft, non tender and non distended. No masses, hepatosplenomegaly or hernias noted. Normal Bowel sounds Musculoskeletal: Symmetrical with no gross deformities  Pulses:  Normal pulses noted Extremities: No clubbing, cyanosis, edema or deformities noted Neurological: Alert oriented x 4, grossly nonfocal Psychological:  Alert and cooperative. Normal mood and affect  Assessment and Recommendations:  1. Crohn's colitis-severe, currently asymptomatic. Continue Humira and 6MP at current dosages and schedules. No change in management. Vaccinations reviewed and up-to-date except for hepatitis B vaccine will begin hepatitis B vaccine protocol. Return office visit in 3 months.  2. Elevated transaminases. Slightly improved on last hepatic panel. Repeat LFTs and if her LFTs fail to improve we will obtain all standard serologies.   3. Cholelithiasis. Asymptomatic   4. Iron deficiency anemia. Corrected on iron replacement. CBC today. If her hemoglobin remains normal will discontinue iron.   5. GERD. Continue standard antireflux measures and omeprazole 40 mg daily.

## 2013-04-11 NOTE — Telephone Encounter (Signed)
Pharmacy needed clarification on mercaptopurine dosage. Informed pharmacy that she takes 1 1/2 tablets daily.

## 2013-04-12 ENCOUNTER — Other Ambulatory Visit

## 2013-04-12 DIAGNOSIS — R7402 Elevation of levels of lactic acid dehydrogenase (LDH): Secondary | ICD-10-CM

## 2013-04-12 DIAGNOSIS — R7401 Elevation of levels of liver transaminase levels: Secondary | ICD-10-CM

## 2013-04-12 LAB — ANGIOTENSIN CONVERTING ENZYME: Angiotensin-Converting Enzyme: 58 U/L — ABNORMAL HIGH (ref 8–52)

## 2013-04-13 LAB — ANA: Anti Nuclear Antibody(ANA): NEGATIVE

## 2013-04-13 LAB — ANTI-SMOOTH MUSCLE ANTIBODY, IGG: Smooth Muscle Ab: 16 U (ref <20)

## 2013-04-14 LAB — CERULOPLASMIN: Ceruloplasmin: 27 mg/dL (ref 20–60)

## 2013-04-14 LAB — ALPHA-1-ANTITRYPSIN: A-1 Antitrypsin, Ser: 138 mg/dL (ref 90–200)

## 2013-04-16 ENCOUNTER — Telehealth: Payer: Self-pay | Admitting: Gastroenterology

## 2013-04-16 LAB — MITOCHONDRIAL ANTIBODIES: Mitochondrial M2 Ab, IgG: 0.4 (ref <0.91)

## 2013-04-16 NOTE — Telephone Encounter (Signed)
Spoke with patient and told her some of the results are still pending. Told her we will call when all results are back.

## 2013-04-17 ENCOUNTER — Other Ambulatory Visit: Payer: Self-pay

## 2013-04-17 DIAGNOSIS — Z9225 Personal history of immunosupression therapy: Secondary | ICD-10-CM

## 2013-04-17 DIAGNOSIS — K5090 Crohn's disease, unspecified, without complications: Secondary | ICD-10-CM

## 2013-04-19 ENCOUNTER — Other Ambulatory Visit (INDEPENDENT_AMBULATORY_CARE_PROVIDER_SITE_OTHER)

## 2013-04-19 DIAGNOSIS — Z9225 Personal history of immunosupression therapy: Secondary | ICD-10-CM

## 2013-04-19 DIAGNOSIS — K5090 Crohn's disease, unspecified, without complications: Secondary | ICD-10-CM

## 2013-04-19 LAB — CBC WITH DIFFERENTIAL/PLATELET
Basophils Absolute: 0 10*3/uL (ref 0.0–0.1)
Basophils Relative: 1.1 % (ref 0.0–3.0)
Eosinophils Absolute: 0.2 10*3/uL (ref 0.0–0.7)
Eosinophils Relative: 6.5 % — ABNORMAL HIGH (ref 0.0–5.0)
HCT: 38.2 % (ref 36.0–46.0)
Hemoglobin: 13.1 g/dL (ref 12.0–15.0)
Lymphocytes Relative: 42.2 % (ref 12.0–46.0)
Lymphs Abs: 1.2 10*3/uL (ref 0.7–4.0)
MCHC: 34.3 g/dL (ref 30.0–36.0)
MCV: 95 fl (ref 78.0–100.0)
Monocytes Absolute: 0.3 10*3/uL (ref 0.1–1.0)
Monocytes Relative: 8.8 % (ref 3.0–12.0)
Neutro Abs: 1.2 10*3/uL — ABNORMAL LOW (ref 1.4–7.7)
Neutrophils Relative %: 41.4 % — ABNORMAL LOW (ref 43.0–77.0)
Platelets: 280 10*3/uL (ref 150.0–400.0)
RBC: 4.02 Mil/uL (ref 3.87–5.11)
RDW: 14 % (ref 11.5–14.6)
WBC: 2.9 10*3/uL — ABNORMAL LOW (ref 4.5–10.5)

## 2013-04-19 LAB — HEPATIC FUNCTION PANEL
ALT: 61 U/L — ABNORMAL HIGH (ref 0–35)
AST: 37 U/L (ref 0–37)
Albumin: 4.2 g/dL (ref 3.5–5.2)
Alkaline Phosphatase: 73 U/L (ref 39–117)
Bilirubin, Direct: 0.1 mg/dL (ref 0.0–0.3)
Total Bilirubin: 1.1 mg/dL (ref 0.3–1.2)
Total Protein: 7.3 g/dL (ref 6.0–8.3)

## 2013-05-02 ENCOUNTER — Telehealth: Payer: Self-pay | Admitting: Gastroenterology

## 2013-05-02 DIAGNOSIS — K5090 Crohn's disease, unspecified, without complications: Secondary | ICD-10-CM

## 2013-05-02 NOTE — Telephone Encounter (Signed)
Yes 6 MP metabolites

## 2013-05-02 NOTE — Telephone Encounter (Signed)
Please ask Dr. Fuller Plan

## 2013-05-02 NOTE — Telephone Encounter (Signed)
Patient aware.  She will come for lab work this week

## 2013-05-02 NOTE — Telephone Encounter (Signed)
Left message for patient to call back  

## 2013-05-02 NOTE — Telephone Encounter (Signed)
Sorry, Dr. Fuller Plan do you want her to come in for labs.  See question on labs 04/19/13

## 2013-05-02 NOTE — Telephone Encounter (Signed)
Dr. Carlean Purl see my result note on labs from 04/19/13.  Do you want her to come back for the 6 mp labs?

## 2013-05-03 ENCOUNTER — Other Ambulatory Visit (INDEPENDENT_AMBULATORY_CARE_PROVIDER_SITE_OTHER)

## 2013-05-03 DIAGNOSIS — K5090 Crohn's disease, unspecified, without complications: Secondary | ICD-10-CM

## 2013-05-03 LAB — CBC WITH DIFFERENTIAL/PLATELET
Basophils Absolute: 0 10*3/uL (ref 0.0–0.1)
Basophils Relative: 1.4 % (ref 0.0–3.0)
Eosinophils Absolute: 0.2 10*3/uL (ref 0.0–0.7)
Eosinophils Relative: 6.2 % — ABNORMAL HIGH (ref 0.0–5.0)
HCT: 39.6 % (ref 36.0–46.0)
Hemoglobin: 13.5 g/dL (ref 12.0–15.0)
Lymphocytes Relative: 36.2 % (ref 12.0–46.0)
Lymphs Abs: 1.1 10*3/uL (ref 0.7–4.0)
MCHC: 34 g/dL (ref 30.0–36.0)
MCV: 94.5 fl (ref 78.0–100.0)
Monocytes Absolute: 0.4 10*3/uL (ref 0.1–1.0)
Monocytes Relative: 11.7 % (ref 3.0–12.0)
Neutro Abs: 1.4 10*3/uL (ref 1.4–7.7)
Neutrophils Relative %: 44.5 % (ref 43.0–77.0)
Platelets: 297 10*3/uL (ref 150.0–400.0)
RBC: 4.19 Mil/uL (ref 3.87–5.11)
RDW: 14.1 % (ref 11.5–14.6)
WBC: 3.1 10*3/uL — ABNORMAL LOW (ref 4.5–10.5)

## 2013-05-03 LAB — LIPASE: Lipase: 25 U/L (ref 11.0–59.0)

## 2013-05-10 ENCOUNTER — Ambulatory Visit (INDEPENDENT_AMBULATORY_CARE_PROVIDER_SITE_OTHER): Admitting: Gastroenterology

## 2013-05-10 DIAGNOSIS — Z23 Encounter for immunization: Secondary | ICD-10-CM

## 2013-05-11 ENCOUNTER — Encounter

## 2013-05-11 LAB — THIOPURINE METABOLITES
6-MMPN Metaboilte: 694 pmol/8x 10E8
6-TGN Metabolite: 118 pmol/8x 10E8

## 2013-05-21 ENCOUNTER — Other Ambulatory Visit: Payer: Self-pay | Admitting: Gynecology

## 2013-05-21 DIAGNOSIS — R921 Mammographic calcification found on diagnostic imaging of breast: Secondary | ICD-10-CM

## 2013-05-31 LAB — HM DIABETES EYE EXAM

## 2013-06-06 ENCOUNTER — Ambulatory Visit (INDEPENDENT_AMBULATORY_CARE_PROVIDER_SITE_OTHER): Admitting: Gastroenterology

## 2013-06-06 ENCOUNTER — Encounter: Payer: Self-pay | Admitting: Gastroenterology

## 2013-06-06 VITALS — BP 112/68 | HR 88 | Ht 63.0 in | Wt 178.0 lb

## 2013-06-06 DIAGNOSIS — K5010 Crohn's disease of large intestine without complications: Secondary | ICD-10-CM

## 2013-06-06 NOTE — Progress Notes (Signed)
    History of Present Illness: This is a 58 year old female with Crohn's colitis done very well for the past several months on Humira and 6-MP. She has no GI complaints. Thiopurine metabolite levels were recently checked:   6-TGN Metabolite 118 low    6-MMPN Metaboilte 694 low   Current Medications, Allergies, Past Medical History, Past Surgical History, Family History and Social History were reviewed in [employer] record.  Physical Exam: General: Well developed , well nourished, no acute distress Head: Normocephalic and atraumatic Eyes:  sclerae anicteric, EOMI Ears: Normal auditory acuity Mouth: No deformity or lesions Lungs: Clear throughout to auscultation Heart: Regular rate and rhythm; no murmurs, rubs or bruits Abdomen: Soft, non tender and non distended. No masses, hepatosplenomegaly or hernias noted. Normal Bowel sounds Musculoskeletal: Symmetrical with no gross deformities  Pulses:  Normal pulses noted Extremities: No clubbing, cyanosis, edema or deformities noted Neurological: Alert oriented x 4, grossly nonfocal Psychological:  Alert and cooperative. Normal mood and affect  Assessment and Recommendations:  1. Crohn's colitis, stable on Humira and 6 MP. Although her 6-TGN level was below the therapeutic range she has done well clinically so we will leave her at this current dose. Will consider discontinuing 6-MP at 1 year from the time she started Humira presuming she continues to do well.

## 2013-06-06 NOTE — Patient Instructions (Signed)
Please follow-up with Dr. Fuller Plan on 07/18/13 at 9:15am.   Thank you for choosing me and [facility].  Pricilla Riffle. Dagoberto Ligas., MD., Marval Regal

## 2013-06-25 ENCOUNTER — Ambulatory Visit
Admission: RE | Admit: 2013-06-25 | Discharge: 2013-06-25 | Disposition: A | Discharge status: Home / Self Care | Source: Ambulatory Visit | Attending: Gynecology | Admitting: Gynecology

## 2013-06-25 DIAGNOSIS — R921 Mammographic calcification found on diagnostic imaging of breast: Secondary | ICD-10-CM

## 2013-07-12 ENCOUNTER — Encounter: Payer: Self-pay | Admitting: Gastroenterology

## 2013-07-18 ENCOUNTER — Other Ambulatory Visit: Payer: Self-pay

## 2013-07-18 ENCOUNTER — Other Ambulatory Visit (INDEPENDENT_AMBULATORY_CARE_PROVIDER_SITE_OTHER)

## 2013-07-18 ENCOUNTER — Ambulatory Visit: Admitting: Gastroenterology

## 2013-07-18 ENCOUNTER — Ambulatory Visit (INDEPENDENT_AMBULATORY_CARE_PROVIDER_SITE_OTHER): Admitting: Gastroenterology

## 2013-07-18 ENCOUNTER — Encounter: Payer: Self-pay | Admitting: Gastroenterology

## 2013-07-18 VITALS — BP 116/70 | HR 80 | Ht 64.0 in | Wt 178.1 lb

## 2013-07-18 DIAGNOSIS — K5010 Crohn's disease of large intestine without complications: Secondary | ICD-10-CM

## 2013-07-18 LAB — CBC WITH DIFFERENTIAL/PLATELET
Basophils Absolute: 0 10*3/uL (ref 0.0–0.1)
Basophils Relative: 0.5 % (ref 0.0–3.0)
Eosinophils Absolute: 0.2 10*3/uL (ref 0.0–0.7)
Eosinophils Relative: 6.6 % — ABNORMAL HIGH (ref 0.0–5.0)
HCT: 38.9 % (ref 36.0–46.0)
Hemoglobin: 12.9 g/dL (ref 12.0–15.0)
Lymphocytes Relative: 37.7 % (ref 12.0–46.0)
Lymphs Abs: 1 10*3/uL (ref 0.7–4.0)
MCHC: 33.2 g/dL (ref 30.0–36.0)
MCV: 98.6 fl (ref 78.0–100.0)
Monocytes Absolute: 0.3 10*3/uL (ref 0.1–1.0)
Monocytes Relative: 10 % (ref 3.0–12.0)
Neutro Abs: 1.2 10*3/uL — ABNORMAL LOW (ref 1.4–7.7)
Neutrophils Relative %: 45.2 % (ref 43.0–77.0)
Platelets: 332 10*3/uL (ref 150.0–400.0)
RBC: 3.94 Mil/uL (ref 3.87–5.11)
RDW: 14.8 % — ABNORMAL HIGH (ref 11.5–14.6)
WBC: 2.7 10*3/uL — ABNORMAL LOW (ref 4.5–10.5)

## 2013-07-18 LAB — COMPREHENSIVE METABOLIC PANEL
ALT: 68 U/L — ABNORMAL HIGH (ref 0–35)
AST: 40 U/L — ABNORMAL HIGH (ref 0–37)
Albumin: 4.2 g/dL (ref 3.5–5.2)
Alkaline Phosphatase: 71 U/L (ref 39–117)
BUN: 14 mg/dL (ref 6–23)
CO2: 28 mEq/L (ref 19–32)
Calcium: 9.9 mg/dL (ref 8.4–10.5)
Chloride: 106 mEq/L (ref 96–112)
Creatinine, Ser: 0.6 mg/dL (ref 0.4–1.2)
GFR: 127.22 mL/min (ref >60.00)
Glucose, Bld: 79 mg/dL (ref 70–99)
Potassium: 4.4 mEq/L (ref 3.5–5.1)
Sodium: 142 mEq/L (ref 135–145)
Total Bilirubin: 1.2 mg/dL (ref 0.3–1.2)
Total Protein: 7.5 g/dL (ref 6.0–8.3)

## 2013-07-18 LAB — LIPASE: Lipase: 26 U/L (ref 11.0–59.0)

## 2013-07-18 MED ORDER — PEG-KCL-NACL-NASULF-NA ASC-C 100 G PO SOLR: 1.0000 | Freq: Once | ORAL | Status: DC

## 2013-07-18 NOTE — Patient Instructions (Signed)
Your physician has requested that you go to the basement for the following lab work before leaving today:cbc, cmet and lipase.  You have been scheduled for a colonoscopy with propofol. Please follow written instructions given to you at your visit today.  Please pick up your prep kit at the pharmacy within the next 1-3 days. If you use inhalers (even only as needed), please bring them with you on the day of your procedure.  Thank you for choosing me and [facility].  Pricilla Riffle. Dagoberto Ligas., MD., Marval Regal

## 2013-07-18 NOTE — Progress Notes (Signed)
    History of Present Illness: This is a 58 year old female with Crohn's colitis. No GI complaints on her current regimen. IBD initially diagnosed in 2009.  Current Medications, Allergies, Past Medical History, Past Surgical History, Family History and Social History were reviewed in [employer] record.  Physical Exam: General: Well developed , well nourished, no acute distress Head: Normocephalic and atraumatic Eyes:  sclerae anicteric, EOMI Ears: Normal auditory acuity Mouth: No deformity or lesions Lungs: Clear throughout to auscultation Heart: Regular rate and rhythm; no murmurs, rubs or bruits Abdomen: Soft, non tender and non distended. No masses, hepatosplenomegaly or hernias noted. Normal Bowel sounds Rectal: Deferred to colonoscopy Musculoskeletal: Symmetrical with no gross deformities  Pulses:  Normal pulses noted Extremities: No clubbing, cyanosis, edema or deformities noted Neurological: Alert oriented x 4, grossly nonfocal Psychological:  Alert and cooperative. Normal mood and affect  Assessment and Recommendations:  1. Crohn's colitis, stable on Humira and 6 MP. Although her 6-TGN level was below the therapeutic range she has done well clinically so we will leave her at this current dose. Will consider discontinuing 6-MP at 1 year from the time she started Humira presuming she continues to do well. Routine blood work today. Schedule colonoscopy. The risks, benefits, and alternatives to colonoscopy with possible biopsy and possible polypectomy were discussed with the patient and they consent to proceed.

## 2013-08-07 ENCOUNTER — Other Ambulatory Visit: Payer: Self-pay | Admitting: Gastroenterology

## 2013-09-10 ENCOUNTER — Encounter: Admitting: Women's Health

## 2013-09-11 ENCOUNTER — Encounter: Payer: Self-pay | Admitting: Women's Health

## 2013-09-11 ENCOUNTER — Ambulatory Visit (INDEPENDENT_AMBULATORY_CARE_PROVIDER_SITE_OTHER): Admitting: Women's Health

## 2013-09-11 VITALS — BP 120/78 | Ht 64.0 in | Wt 178.0 lb

## 2013-09-11 DIAGNOSIS — M8580 Other specified disorders of bone density and structure, unspecified site: Secondary | ICD-10-CM

## 2013-09-11 DIAGNOSIS — M899 Disorder of bone, unspecified: Secondary | ICD-10-CM

## 2013-09-11 DIAGNOSIS — Z01419 Encounter for gynecological examination (general) (routine) without abnormal findings: Secondary | ICD-10-CM

## 2013-09-11 DIAGNOSIS — M949 Disorder of cartilage, unspecified: Secondary | ICD-10-CM

## 2013-09-11 NOTE — Progress Notes (Signed)
Brianna Santana [birth year] [id number]    History:    Presents for annual exam.  TAH for fibroids/no HRT. Normal Pap history, mammogram had to have a diagnostic mammogram of left breast that showed benign calcifications 04/2013. hyperthyroid treated with radioactive iodine normal TSH after. Crohn's disease diagnosed 02/2012 Dr. Fuller Plan managing with humira. Schedule colonoscopy next week. 2014 Osteopenia  T score -2.2 at [facility] spine FRAX 4.9%/0.4% normal vitamin D, PTH and TSH, declined treatment.  Past medical history, past surgical history, family history and social history were all reviewed and documented in the EPIC chart. She does present history, recurring children all doing well. Father, sister, brother with diabetes and hypertension.  ROS:  A  ROS was performed and pertinent positives and negatives are included.  Exam:  Filed Vitals:   09/11/13 0823  BP: 120/78    General appearance:  Normal Thyroid:  Symmetrical, normal in size, without palpable masses or nodularity. Respiratory  Auscultation:  Clear without wheezing or rhonchi Cardiovascular  Auscultation:  Regular rate, without rubs, murmurs or gallops  Edema/varicosities:  Not grossly evident Abdominal  Soft,nontender, without masses, guarding or rebound.  Liver/spleen:  No organomegaly noted  Hernia:  None appreciated  Skin  Inspection:  Grossly normal   Breasts: Examined lying and sitting.     Right: Without masses, retractions, discharge or axillary adenopathy.     Left: Without masses, retractions, discharge or axillary adenopathy. Gentitourinary   Inguinal/mons:  Normal without inguinal adenopathy  External genitalia:  Normal  BUS/Urethra/Skene's glands:  Normal  Vagina:  Normal  Cervix:  Absent  Uterus:  Absent  Adnexa/parametria:     Rt: Without masses or tenderness.   Lt: Without masses or tenderness.  Anus and perineum: Normal  Digital rectal exam: Normal sphincter tone without palpated masses or  tenderness  Assessment/Plan:  58 y.o. MBF G3P3 for annual exam with no complaints.  TAH/fibroids-no HRT 2013 Crohn's disease /colonoscopy scheduled next week 2014 osteopenia without elevated FRAX  Plan: SBE's, annual mammogram, 3D tomography encouraged, history of dense breast. Home safety and fall prevention discussed. Vitamin D 2000 daily, calcium rich diet and regular exercise encouraged. Labs at primary care.     Huel Cote [facility], 8:55 AM 09/11/2013

## 2013-09-11 NOTE — Patient Instructions (Signed)
Health Recommendations for Postmenopausal Women Respected and ongoing research has looked at the most common causes of death, disability, and poor quality of life in postmenopausal women. The causes include heart disease, diseases of blood vessels, diabetes, depression, cancer, and bone loss (osteoporosis). Many things can be done to help lower the chances of developing these and other common problems: CARDIOVASCULAR DISEASE Heart Disease: A heart attack is a medical emergency. Know the signs and symptoms of a heart attack. Below are things women can do to reduce their risk for heart disease.   Do not smoke. If you smoke, quit.  Aim for a healthy weight. Being overweight causes many preventable deaths. Eat a healthy and balanced diet and drink an adequate amount of liquids.  Get moving. Make a commitment to be more physically active. Aim for 30 minutes of activity on most, if not all days of the week.  Eat for heart health. Choose a diet that is low in saturated fat and cholesterol and eliminate trans fat. Include whole grains, vegetables, and fruits. Read and understand the labels on food containers before buying.  Know your numbers. Ask your caregiver to check your blood pressure, cholesterol (total, HDL, LDL, triglycerides) and blood glucose. Work with your caregiver on improving your entire clinical picture.  High blood pressure. Limit or stop your table salt intake (try salt substitute and food seasonings). Avoid salty foods and drinks. Read labels on food containers before buying. Eating well and exercising can help control high blood pressure. STROKE  Stroke is a medical emergency. Stroke may be the result of a blood clot in a blood vessel in the brain or by a brain hemorrhage (bleeding). Know the signs and symptoms of a stroke. To lower the risk of developing a stroke:  Avoid fatty foods.  Quit smoking.  Control your diabetes, blood pressure, and irregular heart rate. THROMBOPHLEBITIS  (BLOOD CLOT) OF THE LEG  Becoming overweight and leading a stationary lifestyle may also contribute to developing blood clots. Controlling your diet and exercising will help lower the risk of developing blood clots. CANCER SCREENING  Breast Cancer: Take steps to reduce your risk of breast cancer.  You should practice "breast self-awareness." This means understanding the normal appearance and feel of your breasts and should include breast self-examination. Any changes detected, no matter how small, should be reported to your caregiver.  After age 58, you should have a clinical breast exam (CBE) every year.  Starting at age 58, you should consider having a mammogram (breast X-ray) every year.  If you have a family history of breast cancer, talk to your caregiver about genetic screening.  If you are at high risk for breast cancer, talk to your caregiver about having an MRI and a mammogram every year.  Intestinal or Stomach Cancer: Tests to consider are a rectal exam, fecal occult blood, sigmoidoscopy, and colonoscopy. Women who are high risk may need to be screened at an earlier age and more often.  Cervical Cancer:  Beginning at age 58, you should have a Pap test every 3 years as long as the past 3 Pap tests have been normal.  If you have had past treatment for cervical cancer or a condition that could lead to cancer, you need Pap tests and screening for cancer for at least 20 years after your treatment.  If you had a hysterectomy for a problem that was not cancer or a condition that could lead to cancer, then you no longer need Pap tests.  If you are between ages 58 and 51, and you have had normal Pap tests going back 10 years, you no longer need Pap tests.  If Pap tests have been discontinued, risk factors (such as a new sexual partner) need to be reassessed to determine if screening should be resumed.  Some medical problems can increase the chance of getting cervical cancer. In these  cases, your caregiver may recommend more frequent screening and Pap tests.  Uterine Cancer: If you have vaginal bleeding after reaching menopause, you should notify your caregiver.  Ovarian cancer: Other than yearly pelvic exams, there are no reliable tests available to screen for ovarian cancer at this time except for yearly pelvic exams.  Lung Cancer: Yearly chest X-rays can detect lung cancer and should be done on high risk women, such as cigarette smokers and women with chronic lung disease (emphysema).  Skin Cancer: A complete body skin exam should be done at your yearly examination. Avoid overexposure to the sun and ultraviolet light lamps. Use a strong sun block cream when in the sun. All of these things are important in lowering the risk of skin cancer. MENOPAUSE Menopause Symptoms: Hormone therapy products are effective for treating symptoms associated with menopause:  Moderate to severe hot flashes.  Night sweats.  Mood swings.  Headaches.  Tiredness.  Loss of sex drive.  Insomnia.  Other symptoms. Hormone replacement carries certain risks, especially in older women. Women who use or are thinking about using estrogen or estrogen with progestin treatments should discuss that with their caregiver. Your caregiver will help you understand the benefits and risks. The ideal dose of hormone replacement therapy is not known. The Food and Drug Administration (FDA) has concluded that hormone therapy should be used only at the lowest doses and for the shortest amount of time to reach treatment goals.  OSTEOPOROSIS Protecting Against Bone Loss and Preventing Fracture: If you use hormone therapy for prevention of bone loss (osteoporosis), the risks for bone loss must outweigh the risk of the therapy. Ask your caregiver about other medications known to be safe and effective for preventing bone loss and fractures. To guard against bone loss or fractures, the following is recommended:  If  you are less than age 58, take 1000 mg of calcium and at least 600 mg of Vitamin D per day.  If you are greater than age 58 but less than age 7, take 1200 mg of calcium and at least 600 mg of Vitamin D per day.  If you are greater than age 56, take 1200 mg of calcium and at least 800 mg of Vitamin D per day. Smoking and excessive alcohol intake increases the risk of osteoporosis. Eat foods rich in calcium and vitamin D and do weight bearing exercises several times a week as your caregiver suggests. DIABETES Diabetes Melitus: If you have Type I or Type 2 diabetes, you should keep your blood sugar under control with diet, exercise and recommended medication. Avoid too many sweets, starchy and fatty foods. Being overweight can make control more difficult. COGNITION AND MEMORY Cognition and Memory: Menopausal hormone therapy is not recommended for the prevention of cognitive disorders such as Alzheimer's disease or memory loss.  DEPRESSION  Depression may occur at any age, but is common in elderly women. The reasons may be because of physical, medical, social (loneliness), or financial problems and needs. If you are experiencing depression because of medical problems and control of symptoms, talk to your caregiver about this. Physical activity and  exercise may help with mood and sleep. Community and volunteer involvement may help your sense of value and worth. If you have depression and you feel that the problem is getting worse or becoming severe, talk to your caregiver about treatment options that are best for you. ACCIDENTS  Accidents are common and can be serious in the elderly woman. Prepare your house to prevent accidents. Eliminate throw rugs, place hand bars in the bath, shower and toilet areas. Avoid wearing high heeled shoes or walking on wet, snowy, and icy areas. Limit or stop driving if you have vision or hearing problems, or you feel you are unsteady with you movements and  reflexes. HEPATITIS C Hepatitis C is a type of viral infection affecting the liver. It is spread mainly through contact with blood from an infected person. It can be treated, but if left untreated, it can lead to severe liver damage over years. Many people who are infected do not know that the virus is in their blood. If you are a "baby-boomer", it is recommended that you have one screening test for Hepatitis C. IMMUNIZATIONS  Several immunizations are important to consider having during your senior years, including:   Tetanus, diptheria, and pertussis booster shot.  Influenza every year before the flu season begins.  Pneumonia vaccine.  Shingles vaccine.  Others as indicated based on your specific needs. Talk to your caregiver about these. Document Released: 07/09/2005 Document Revised: 05/03/2012 Document Reviewed: 03/04/2008 [facility] Patient Information 2014 [place].

## 2013-09-17 ENCOUNTER — Telehealth: Payer: Self-pay

## 2013-09-17 NOTE — Telephone Encounter (Signed)
Message copied by Marlon Pel on Mon Sep 17, 2013 10:46 AM ------      Message from: Marlon Pel      Created: Wed Jul 18, 2013  4:39 PM       Needs labs- see results 2/18 ------

## 2013-09-17 NOTE — Telephone Encounter (Signed)
Patient notified she is due for routine lab work.  She will come tomorrow prior to her procedure

## 2013-09-18 ENCOUNTER — Ambulatory Visit (AMBULATORY_SURGERY_CENTER): Admitting: Gastroenterology

## 2013-09-18 ENCOUNTER — Encounter: Payer: Self-pay | Admitting: Gastroenterology

## 2013-09-18 ENCOUNTER — Other Ambulatory Visit (INDEPENDENT_AMBULATORY_CARE_PROVIDER_SITE_OTHER)

## 2013-09-18 VITALS — BP 138/87 | HR 75 | Temp 98.5°F | Resp 16 | Ht 64.0 in | Wt 178.0 lb

## 2013-09-18 DIAGNOSIS — D128 Benign neoplasm of rectum: Secondary | ICD-10-CM

## 2013-09-18 DIAGNOSIS — K5010 Crohn's disease of large intestine without complications: Secondary | ICD-10-CM

## 2013-09-18 DIAGNOSIS — D129 Benign neoplasm of anus and anal canal: Secondary | ICD-10-CM

## 2013-09-18 DIAGNOSIS — D126 Benign neoplasm of colon, unspecified: Secondary | ICD-10-CM

## 2013-09-18 HISTORY — PX: COLONOSCOPY: SHX174

## 2013-09-18 LAB — CBC WITH DIFFERENTIAL/PLATELET
Basophils Absolute: 0 10*3/uL (ref 0.0–0.1)
Basophils Relative: 1.7 % (ref 0.0–3.0)
Eosinophils Absolute: 0.1 10*3/uL (ref 0.0–0.7)
Eosinophils Relative: 3.5 % (ref 0.0–5.0)
HCT: 37.6 % (ref 36.0–46.0)
Hemoglobin: 12.8 g/dL (ref 12.0–15.0)
Lymphocytes Relative: 44.4 % (ref 12.0–46.0)
Lymphs Abs: 1.2 10*3/uL (ref 0.7–4.0)
MCHC: 34 g/dL (ref 30.0–36.0)
MCV: 97.6 fl (ref 78.0–100.0)
Monocytes Absolute: 0.3 10*3/uL (ref 0.1–1.0)
Monocytes Relative: 10.4 % (ref 3.0–12.0)
Neutro Abs: 1.1 10*3/uL — ABNORMAL LOW (ref 1.4–7.7)
Neutrophils Relative %: 40 % — ABNORMAL LOW (ref 43.0–77.0)
Platelets: 285 10*3/uL (ref 150.0–400.0)
RBC: 3.85 Mil/uL — ABNORMAL LOW (ref 3.87–5.11)
RDW: 15.3 % — ABNORMAL HIGH (ref 11.5–14.6)
WBC: 2.8 10*3/uL — ABNORMAL LOW (ref 4.5–10.5)

## 2013-09-18 LAB — HEPATIC FUNCTION PANEL
ALT: 108 U/L — ABNORMAL HIGH (ref 0–35)
AST: 52 U/L — ABNORMAL HIGH (ref 0–37)
Albumin: 4.5 g/dL (ref 3.5–5.2)
Alkaline Phosphatase: 77 U/L (ref 39–117)
Bilirubin, Direct: 0.3 mg/dL (ref 0.0–0.3)
Total Bilirubin: 2.3 mg/dL — ABNORMAL HIGH (ref 0.3–1.2)
Total Protein: 7.8 g/dL (ref 6.0–8.3)

## 2013-09-18 LAB — LIPASE: Lipase: 20 U/L (ref 11.0–59.0)

## 2013-09-18 MED ORDER — SODIUM CHLORIDE 0.9 % IV SOLN: 500.0000 mL | INTRAVENOUS | Status: DC

## 2013-09-18 NOTE — Progress Notes (Signed)
Called to room to assist during endoscopic procedure.  Patient ID and intended procedure confirmed with present staff. Received instructions for my participation in the procedure from the performing physician.  

## 2013-09-18 NOTE — Progress Notes (Signed)
Quick Note:  To Dr. Fuller Plan for review ______

## 2013-09-18 NOTE — Patient Instructions (Addendum)
YOU HAD AN ENDOSCOPIC PROCEDURE TODAY AT [facility]: Refer to the procedure report that was given to you for any specific questions about what was found during the examination.  If the procedure report does not answer your questions, please call your gastroenterologist to clarify.  If you requested that your care partner not be given the details of your procedure findings, then the procedure report has been included in a sealed envelope for you to review at your convenience later.  YOU SHOULD EXPECT: Some feelings of bloating in the abdomen. Passage of more gas than usual.  Walking can help get rid of the air that was put into your GI tract during the procedure and reduce the bloating. If you had a lower endoscopy (such as a colonoscopy or flexible sigmoidoscopy) you may notice spotting of blood in your stool or on the toilet paper. If you underwent a bowel prep for your procedure, then you may not have a normal bowel movement for a few days.  DIET: Your first meal following the procedure should be a light meal and then it is ok to progress to your normal diet.  A half-sandwich or bowl of soup is an example of a good first meal.  Heavy or fried foods are harder to digest and may make you feel nauseous or bloated.  Likewise meals heavy in dairy and vegetables can cause extra gas to form and this can also increase the bloating.  Drink plenty of fluids but you should avoid alcoholic beverages for 24 hours.  ACTIVITY: Your care partner should take you home directly after the procedure.  You should plan to take it easy, moving slowly for the rest of the day.  You can resume normal activity the day after the procedure however you should NOT DRIVE or use heavy machinery for 24 hours (because of the sedation medicines used during the test).    SYMPTOMS TO REPORT IMMEDIATELY: A gastroenterologist can be reached at any hour.  During normal business hours, 8:30 AM to 5:00 PM Monday through Friday,  call [phone].  After hours and on weekends, please call the GI answering service at [phone] who will take a message and have the physician on call contact you.   Following lower endoscopy (colonoscopy or flexible sigmoidoscopy):  Excessive amounts of blood in the stool  Significant tenderness or worsening of abdominal pains  Swelling of the abdomen that is new, acute  Fever of 100F or higher    FOLLOW UP: If any biopsies were taken you will be contacted by phone or by letter within the next 1-3 weeks.  Call your gastroenterologist if you have not heard about the biopsies in 3 weeks.  Our staff will call the home number listed on your records the next business day following your procedure to check on you and address any questions or concerns that you may have at that time regarding the information given to you following your procedure. This is a courtesy call and so if there is no answer at the home number and we have not heard from you through the emergency physician on call, we will assume that you have returned to your regular daily activities without incident.  SIGNATURES/CONFIDENTIALITY: You and/or your care partner have signed paperwork which will be entered into your electronic medical record.  These signatures attest to the fact that that the information above on your After Visit Summary has been reviewed and is understood.  Full responsibility of the confidentiality  of this discharge information lies with you and/or your care-partner.    Information on polyps and hemorrhoids given to you today  Await biopsy results

## 2013-09-18 NOTE — Op Note (Signed)
[place]  [address] [place], [id number]   COLONOSCOPY PROCEDURE REPORT  PATIENT: Brianna Santana, Brianna Santana  MR#: [id number] BIRTHDATE: 1955/11/27 , 27  yrs. old GENDER: Female ENDOSCOPIST: Ladene Artist, MD, [facility] PROCEDURE DATE:  09/18/2013 PROCEDURE:   Colonoscopy with biopsy and snare polypectomy First Screening Colonoscopy - Avg.  risk and is 50 yrs.  old or older - No.  Prior Negative Screening - Now for repeat screening. N/A  History of Adenoma - Now for follow-up colonoscopy & has been > or = to 3 yrs.  N/A  Polyps Removed Today? Yes. ASA CLASS:   Class II INDICATIONS:High risk patient with previously diagnosed Crohn's disease: large intestine. MEDICATIONS: MAC sedation, administered by CRNA and propofol (Diprivan) 264m IV DESCRIPTION OF PROCEDURE:   After the risks benefits and alternatives of the procedure were thoroughly explained, informed consent was obtained.  A digital rectal exam revealed no abnormalities of the rectum.   The LB [id number] LB [id number] endoscope was introduced through the anus and advanced to the terminal ileum which was intubated for a short distance. No adverse events experienced.   The quality of the prep was excellent, using MoviPrep  The instrument was then slowly withdrawn as the colon was fully examined.  COLON FINDINGS: A near circumferential patch of abnormal mucosa was found in the descending colon and sigmoid colon.  The mucosa was friable and had scarring and pseudopolyps.  Multiple biopsies of the area were performed.   Four sessile polyps measuring 3-4 mm in size were found in the rectum.  A polypectomy was performed with a cold snare.  The resection was complete and the polyp tissue was completely retrieved.   The colon was otherwise normal.  There was no diverticulosis, inflammation, polyps or cancers unless previously stated.  Retroflexed views revealed internal hemorrhoids. The time to cecum=3  minutes 04 seconds.  Withdrawal time=10 minutes 03 seconds.  The scope was withdrawn and the procedure completed. COMPLICATIONS: There were no complications.  ENDOSCOPIC IMPRESSION: 1.   Abnormal mucosa in the descending and sigmoid colon; multiple biopsies performed 2.   Four sessile polyps measuring 3-4 mm in the rectum; polypectomy performed with a cold snare 3.   Moderate internal hemorrhoids  RECOMMENDATIONS: 1.  Await pathology results 2.  Repeat Colonoscopy in 5 years.  eSigned:  MLadene Artist MD, FA[facility]04/21/2015 3:14 PM

## 2013-09-19 ENCOUNTER — Telehealth: Payer: Self-pay | Admitting: *Deleted

## 2013-09-19 NOTE — Telephone Encounter (Signed)
  Follow up Call-  Call back number 09/18/2013  Post procedure Call Back phone  # [phone]  Permission to leave phone message Yes     Patient questions:  Do you have a fever, pain , or abdominal swelling? no Pain Score  0 *  Have you tolerated food without any problems? yes  Have you been able to return to your normal activities? yes  Do you have any questions about your discharge instructions: Diet   no Medications  no Follow up visit  no  Do you have questions or concerns about your Care? no  Actions: * If pain score is 4 or above: No action needed, pain <4.

## 2013-09-26 ENCOUNTER — Encounter: Payer: Self-pay | Admitting: Gastroenterology

## 2013-11-05 ENCOUNTER — Other Ambulatory Visit: Payer: Self-pay | Admitting: Gastroenterology

## 2013-11-12 ENCOUNTER — Telehealth: Payer: Self-pay | Admitting: Gastroenterology

## 2013-11-12 ENCOUNTER — Telehealth: Payer: Self-pay | Admitting: *Deleted

## 2013-11-12 ENCOUNTER — Ambulatory Visit (INDEPENDENT_AMBULATORY_CARE_PROVIDER_SITE_OTHER): Admitting: Gastroenterology

## 2013-11-12 DIAGNOSIS — Z23 Encounter for immunization: Secondary | ICD-10-CM

## 2013-11-12 NOTE — Telephone Encounter (Signed)
Patient called back to clarify that her husband has not had face to face contact with their step-son with TB

## 2013-11-12 NOTE — Telephone Encounter (Signed)
Patient came for last Hep B injection today. She states that her step-son has TB and is currently undergoing treatment. Her husband was at a birthday party this weekend and was in direct contact with him.  She was NOT in direct contact (she stayed at home). She is on Humira for U.C. And is concerned about this. Dr Fuller Plan, please advise... Does she need repeat TB testing???

## 2013-11-12 NOTE — Telephone Encounter (Signed)
Other note from today updated.  See other phone note for additional information

## 2013-11-13 NOTE — Telephone Encounter (Signed)
By her report there is little concern for direct contact but its easy to do TB testing so let's proceed.

## 2013-11-14 NOTE — Telephone Encounter (Signed)
Left message for patient to call back  

## 2013-11-14 NOTE — Telephone Encounter (Signed)
I spoke with the patient and she  will come on Friday for PPD.

## 2013-11-16 ENCOUNTER — Ambulatory Visit (INDEPENDENT_AMBULATORY_CARE_PROVIDER_SITE_OTHER): Admitting: Gastroenterology

## 2013-11-16 DIAGNOSIS — Z23 Encounter for immunization: Secondary | ICD-10-CM

## 2013-11-16 DIAGNOSIS — K5190 Ulcerative colitis, unspecified, without complications: Secondary | ICD-10-CM

## 2013-11-19 LAB — TB SKIN TEST
Induration: 0 mm
TB Skin Test: NEGATIVE

## 2013-11-29 ENCOUNTER — Other Ambulatory Visit: Payer: Self-pay | Admitting: Gastroenterology

## 2013-12-25 ENCOUNTER — Other Ambulatory Visit: Payer: Self-pay | Admitting: Gynecology

## 2013-12-25 DIAGNOSIS — R921 Mammographic calcification found on diagnostic imaging of breast: Secondary | ICD-10-CM

## 2014-01-01 ENCOUNTER — Encounter (INDEPENDENT_AMBULATORY_CARE_PROVIDER_SITE_OTHER): Payer: Self-pay

## 2014-01-01 ENCOUNTER — Ambulatory Visit
Admission: RE | Admit: 2014-01-01 | Discharge: 2014-01-01 | Disposition: A | Discharge status: Home / Self Care | Source: Ambulatory Visit | Attending: Gynecology | Admitting: Gynecology

## 2014-01-01 DIAGNOSIS — R921 Mammographic calcification found on diagnostic imaging of breast: Secondary | ICD-10-CM

## 2014-01-22 ENCOUNTER — Other Ambulatory Visit: Payer: Self-pay | Admitting: Gastroenterology

## 2014-02-12 ENCOUNTER — Other Ambulatory Visit (INDEPENDENT_AMBULATORY_CARE_PROVIDER_SITE_OTHER)

## 2014-02-12 ENCOUNTER — Ambulatory Visit (INDEPENDENT_AMBULATORY_CARE_PROVIDER_SITE_OTHER): Admitting: Gastroenterology

## 2014-02-12 ENCOUNTER — Encounter: Payer: Self-pay | Admitting: Gastroenterology

## 2014-02-12 VITALS — BP 120/60 | HR 76 | Ht 64.0 in | Wt 176.4 lb

## 2014-02-12 DIAGNOSIS — K5010 Crohn's disease of large intestine without complications: Secondary | ICD-10-CM

## 2014-02-12 DIAGNOSIS — K219 Gastro-esophageal reflux disease without esophagitis: Secondary | ICD-10-CM

## 2014-02-12 LAB — BASIC METABOLIC PANEL
BUN: 15 mg/dL (ref 6–23)
CO2: 29 mEq/L (ref 19–32)
Calcium: 9.8 mg/dL (ref 8.4–10.5)
Chloride: 105 mEq/L (ref 96–112)
Creatinine, Ser: 0.7 mg/dL (ref 0.4–1.2)
GFR: 116.09 mL/min (ref >60.00)
Glucose, Bld: 91 mg/dL (ref 70–99)
Potassium: 4.2 mEq/L (ref 3.5–5.1)
Sodium: 141 mEq/L (ref 135–145)

## 2014-02-12 LAB — HEPATIC FUNCTION PANEL
ALT: 78 U/L — ABNORMAL HIGH (ref 0–35)
AST: 40 U/L — ABNORMAL HIGH (ref 0–37)
Albumin: 4.2 g/dL (ref 3.5–5.2)
Alkaline Phosphatase: 78 U/L (ref 39–117)
Bilirubin, Direct: 0.2 mg/dL (ref 0.0–0.3)
Total Bilirubin: 1.4 mg/dL — ABNORMAL HIGH (ref 0.2–1.2)
Total Protein: 7.6 g/dL (ref 6.0–8.3)

## 2014-02-12 LAB — TSH: TSH: 2.88 u[IU]/mL (ref 0.35–4.50)

## 2014-02-12 NOTE — Progress Notes (Signed)
    History of Present Illness: This is a 58 year old female with Crohn's colitis returning for followup. States she has had no I will mild transient epigastric pain associated with nausea on several occasions in the past few weeks. He states she does not take Mobic on a daily basis only as needed. She has no other gastrointestinal complaints.  Current Medications, Allergies, Past Medical History, Past Surgical History, Family History and Social History were reviewed in [employer] record.  Physical Exam: General: Well developed , well nourished, no acute distress Head: Normocephalic and atraumatic Eyes:  sclerae anicteric, EOMI Ears: Normal auditory acuity Mouth: No deformity or lesions Lungs: Clear throughout to auscultation Heart: Regular rate and rhythm; no murmurs, rubs or bruits Abdomen: Soft, non tender and non distended. No masses, hepatosplenomegaly or hernias noted. Normal Bowel sounds Musculoskeletal: Symmetrical with no gross deformities  Pulses:  Normal pulses noted Extremities: No clubbing, cyanosis, edema or deformities noted Neurological: Alert oriented x 4, grossly nonfocal Psychological:  Alert and cooperative. Normal mood and affect  Assessment and Recommendations:   1. Crohn's colitis, stable on Humira and 6 MP. She has been maintained on 6-MP and Humira since December 2013. Her Crohn's symptoms have been under excellent control for over one year. Will  discontinue 6-MP. Routine blood work today. Continue Humira. REV in 6 months.   2. GERD and mild intermittent epigastric pain. Intensify antireflux measures. Continue omeprazole 40 mg daily. She is advised to contact our office if her epigastric pain does not resolve.

## 2014-02-12 NOTE — Patient Instructions (Signed)
Your physician has requested that you go to the basement for the following lab work before leaving today:[place] health panel.  Stop taking your mercaptopurine.   Continue Humira.

## 2014-02-13 LAB — CBC WITH DIFFERENTIAL/PLATELET
Basophils Absolute: 0 10*3/uL (ref 0.0–0.1)
Basophils Relative: 0.7 % (ref 0.0–3.0)
Eosinophils Absolute: 0.2 10*3/uL (ref 0.0–0.7)
Eosinophils Relative: 7 % — ABNORMAL HIGH (ref 0.0–5.0)
HCT: 37.9 % (ref 36.0–46.0)
Hemoglobin: 13 g/dL (ref 12.0–15.0)
Lymphocytes Relative: 37.5 % (ref 12.0–46.0)
Lymphs Abs: 1 10*3/uL (ref 0.7–4.0)
MCHC: 34.3 g/dL (ref 30.0–36.0)
MCV: 98.2 fl (ref 78.0–100.0)
Monocytes Absolute: 0.3 10*3/uL (ref 0.1–1.0)
Monocytes Relative: 9.6 % (ref 3.0–12.0)
Neutro Abs: 1.2 10*3/uL — ABNORMAL LOW (ref 1.4–7.7)
Neutrophils Relative %: 45.2 % (ref 43.0–77.0)
Platelets: 289 10*3/uL (ref 150.0–400.0)
RBC: 3.86 Mil/uL — ABNORMAL LOW (ref 3.87–5.11)
RDW: 15.2 % (ref 11.5–15.5)
WBC: 2.7 10*3/uL — ABNORMAL LOW (ref 4.0–10.5)

## 2014-03-15 ENCOUNTER — Telehealth: Payer: Self-pay | Admitting: Gastroenterology

## 2014-03-15 NOTE — Telephone Encounter (Signed)
Left message for patient to call back  

## 2014-03-18 ENCOUNTER — Encounter: Payer: Self-pay | Admitting: Physician Assistant

## 2014-03-18 ENCOUNTER — Ambulatory Visit (INDEPENDENT_AMBULATORY_CARE_PROVIDER_SITE_OTHER): Admitting: Physician Assistant

## 2014-03-18 ENCOUNTER — Other Ambulatory Visit (INDEPENDENT_AMBULATORY_CARE_PROVIDER_SITE_OTHER)

## 2014-03-18 VITALS — BP 130/82 | HR 80 | Ht 64.0 in | Wt 180.2 lb

## 2014-03-18 DIAGNOSIS — R1013 Epigastric pain: Secondary | ICD-10-CM

## 2014-03-18 DIAGNOSIS — IMO0001 Reserved for inherently not codable concepts without codable children: Secondary | ICD-10-CM | POA: Insufficient documentation

## 2014-03-18 DIAGNOSIS — R1110 Vomiting, unspecified: Secondary | ICD-10-CM

## 2014-03-18 DIAGNOSIS — R112 Nausea with vomiting, unspecified: Secondary | ICD-10-CM

## 2014-03-18 DIAGNOSIS — K5090 Crohn's disease, unspecified, without complications: Secondary | ICD-10-CM

## 2014-03-18 LAB — CBC WITH DIFFERENTIAL/PLATELET
Basophils Absolute: 0 10*3/uL (ref 0.0–0.1)
Basophils Relative: 0.6 % (ref 0.0–3.0)
Eosinophils Absolute: 0 10*3/uL (ref 0.0–0.7)
Eosinophils Relative: 0.4 % (ref 0.0–5.0)
HCT: 39.8 % (ref 36.0–46.0)
Hemoglobin: 12.9 g/dL (ref 12.0–15.0)
Lymphocytes Relative: 28.2 % (ref 12.0–46.0)
Lymphs Abs: 1.6 10*3/uL (ref 0.7–4.0)
MCHC: 32.3 g/dL (ref 30.0–36.0)
MCV: 97 fl (ref 78.0–100.0)
Monocytes Absolute: 0.5 10*3/uL (ref 0.1–1.0)
Monocytes Relative: 9.2 % (ref 3.0–12.0)
Neutro Abs: 3.5 10*3/uL (ref 1.4–7.7)
Neutrophils Relative %: 61.6 % (ref 43.0–77.0)
Platelets: 269 10*3/uL (ref 150.0–400.0)
RBC: 4.1 Mil/uL (ref 3.87–5.11)
RDW: 14.1 % (ref 11.5–15.5)
WBC: 5.7 10*3/uL (ref 4.0–10.5)

## 2014-03-18 LAB — COMPREHENSIVE METABOLIC PANEL
ALT: 35 U/L (ref 0–35)
AST: 25 U/L (ref 0–37)
Albumin: 3.6 g/dL (ref 3.5–5.2)
Alkaline Phosphatase: 87 U/L (ref 39–117)
BUN: 19 mg/dL (ref 6–23)
CO2: 22 mEq/L (ref 19–32)
Calcium: 9.5 mg/dL (ref 8.4–10.5)
Chloride: 105 mEq/L (ref 96–112)
Creatinine, Ser: 0.7 mg/dL (ref 0.4–1.2)
GFR: 112.18 mL/min (ref >60.00)
Glucose, Bld: 89 mg/dL (ref 70–99)
Potassium: 3.5 mEq/L (ref 3.5–5.1)
Sodium: 142 mEq/L (ref 135–145)
Total Bilirubin: 0.9 mg/dL (ref 0.2–1.2)
Total Protein: 7.7 g/dL (ref 6.0–8.3)

## 2014-03-18 MED ORDER — MERCAPTOPURINE 50 MG PO TABS: ORAL_TABLET | ORAL | Status: DC

## 2014-03-18 NOTE — Telephone Encounter (Signed)
Patient to be seen this am by Arta Bruce, PA

## 2014-03-18 NOTE — Patient Instructions (Addendum)
You have been scheduled for an endoscopy. Please follow written instructions given to you at your visit today. If you use inhalers (even only as needed), please bring them with you on the day of your procedure. Your physician has requested that you go to www.startemmi.com and enter the access code given to you at your visit today. This web site gives a general overview about your procedure. However, you should still follow specific instructions given to you by our office regarding your preparation for the procedure.   Your physician has requested that you go to the basement for the following lab work before leaving today: CBC CMET Gastrointestinal Pathogen Panel   Prescription for Mercaptopurine(6 MP) was sent to your pharmacy so you can restart it as you where taking it  Keep your appointment with Dr. Fuller Plan for office follow up on 04-01-2014 at 10:45 am   Please come early to have CBC done before you see Dr. Fuller Plan

## 2014-03-18 NOTE — Progress Notes (Signed)
Patient ID: Brianna Santana, female   DOB: 05-Jun-1955, 58 y.o.   MRN: [id number]     History of Present Illness: This is a followup for this 58 year old female with Crohn's colitis. She was last seen in mid September at which time she was doing well and her 6-MP was discontinued. She is here today stating that since coming off her 6-MP she has been having increasing frequency of diarrhea. She has days of occasional formed stools but primarily days of mushy to loose stools. Last week she had severe crampy abdominal pain for 2 days which has since subsided but her stools are still mushy in consistency. She's had no bright red blood per rectum or no melena. She has been having increasing frequency of epigastric pain associated with nausea. Epigastric pain does not radiate. She states at least twice per week she will eat something or drink water and it will immediately come back up. She does not feel as if she is vomiting but says the food cannot state down. She does not feel as if her food is getting stuck. She has had no fever or chills. Her appetite has been good.Her epigastric pain is not alleviated or exacerbated by ingestion of food. She feel she was doing much better when she was still on 6-MP. She is currently on a Medrol Dosepak that she was started on several days ago for back pain this will be completed in 3 days. She is no longer using MOBIC.   Past Medical History  Diagnosis Date  . Internal hemorrhoids   . Crohn's colitis   . Asthma   . Anemia   . C. difficile colitis 2011 and 07/2010  . GERD (gastroesophageal reflux disease)   . Hyperplastic polyp of intestine 09/2007  . Hyperthyroidism     S/P I-131 RX  . Asthma   . Cervical dysplasia   . Osteopenia 09/2012    T score -2.2 FRAX 4.9%/0.4%    Past Surgical History  Procedure Laterality Date  . Tonsillectomy    . Colonoscopy    . Breast lumpectomy  1980's, 2000    Right Breast x 2  . Total abdominal hysterectomy  2000  .  Colposcopy    . Flexible sigmoidoscopy  03/29/2012    Procedure: FLEXIBLE SIGMOIDOSCOPY;  Surgeon: Ladene Artist, MD,FACG;  Location: [facility] ENDOSCOPY;  Service: Endoscopy;  Laterality: N/A;   Family History  Problem Relation Age of Onset  . Diabetes Father   . Diabetes Sister     x 2  . Hypertension Sister     x 2  . Diabetes Brother   . Hypertension Brother   . Colon cancer Neg Hx    History  Substance Use Topics  . Smoking status: Former Smoker -- 10 years    Types: Cigarettes    Quit date: 09/07/1988  . Smokeless tobacco: Never Used  . Alcohol Use: No   Current Outpatient Prescriptions  Medication Sig Dispense Refill  . albuterol (PROAIR HFA) 108 (90 BASE) MCG/ACT inhaler Inhale 2 puffs into the lungs every 4 (four) hours as needed. For shortness of breath or wheezing      . betamethasone valerate ointment (VALISONE) 0.1 % Apply topically 2 (two) times daily.  30 g  0  . budesonide-formoterol (SYMBICORT) 80-4.5 MCG/ACT inhaler Inhale 2 puffs into the lungs 2 (two) times daily.      . Calcium Carbonate-Vitamin D (CALCIUM 600+D) 600-400 MG-UNIT per tablet Take 1 tablet by mouth 2 (two) times daily.        Marland Kitchen  cetirizine (ZYRTEC) 10 MG tablet Take 10 mg by mouth daily.      . Cholecalciferol (VITAMIN D) 2000 UNITS tablet Take 2,000 Units by mouth daily.      . clobetasol cream (TEMOVATE) 2.84 % Apply 1 application topically as needed. For rash      . gabapentin (NEURONTIN) 300 MG capsule Take 300 mg by mouth 3 (three) times daily.      Marland Kitchen HUMIRA PEN 40 MG/0.8ML PNKT INJECT 40 MG SUBCUTANEOUSLY EVERY 2 WEEKS  2 each  1  . montelukast (SINGULAIR) 10 MG tablet Take 10 mg by mouth at bedtime.       . Multiple Vitamin (MULTIVITAMIN WITH MINERALS) TABS Take 1 tablet by mouth daily.      Marland Kitchen omeprazole (PRILOSEC) 40 MG capsule Take 1 capsule (40 mg total) by mouth daily. For heartburn  90 capsule  3  . oxyCODONE-acetaminophen (PERCOCET/ROXICET) 5-325 MG per tablet Take 1 tablet by mouth at  bedtime as needed for severe pain.      . prednisoLONE 5 MG TABS tablet Take 5 mg by mouth daily.      Marland Kitchen tiZANidine (ZANAFLEX) 4 MG capsule Take 4 mg by mouth at bedtime as needed for muscle spasms.      . vitamin B-12 (CYANOCOBALAMIN) 1000 MCG tablet Take 1,000 mcg by mouth daily.       No current facility-administered medications for this visit.   Allergies  Allergen Reactions  . Levofloxacin Other (See Comments)    Doesn't remember reaction      Review of Systems: Gen: Denies any fever, chills. CV: Denies chest pain. Resp: Denies dyspnea at rest, dyspnea with exercise. GI: Denies vomiting blood, jaundice, and fecal incontinence.   Denies dysphagia or odynophagia. GU : Denies urinary burning, blood in urine, urinary frequency, urinary hesitancy, nocturnal urination, and urinary incontinence. MS:  Admits to low back pain Derm: neg Psych:neg. Heme: neg Neuro:  neg Endo:  neg  LAB RESULTS: On 02/13/14: t bili 1.4, AST 40, ALT 78 On 9/115/15 wbc 2.7, Hgb 12.8, Hct 37.9. plt 289  IMAGING: *RADIOLOGY REPORT*  Clinical Data: Gallstones  ABDOMINAL ULTRASOUND COMPLETE  Comparison: 03/26/2012  Findings:  Gallbladder: Multiple stones are noted within the lumen of the  gallbladder. These measure up to 1.6 cm. No wall thickening or  pericholecystic fluid. Negative sonographic Murphy's sign.  Common Bile Duct: Within normal limits in caliber.  Liver: No focal mass lesion identified. Within normal limits in  parenchymal echogenicity.  IVC: Appears normal.  Pancreas: No abnormality identified.  Spleen: Within normal limits in size and echotexture.  Right kidney: Normal in size and parenchymal echogenicity. No  evidence of mass or hydronephrosis.  Left kidney: Normal in size and parenchymal echogenicity. No  evidence of mass or hydronephrosis.  Abdominal Aorta: No aneurysm identified.  IMPRESSION:  1. Gallstones.  Original Report Authenticated By: Angelita Ingles,  M.D.   PROCEDURES: ENDOSCOPIC IMPRESSION: 1. Abnormal mucosa in the descending and sigmoid colon; multiple biopsies performed 2. Four sessile polyps measuring 3-4 mm in the rectum; polypectomy performed with a cold snare 3. Moderate internal hemorrhoids RECOMMENDATIONS: 1. Await pathology results 2. Repeat Colonoscopy in 5 years.  Physical Exam: General: Pleasant, well developed , well nourished, AAfemale in no acute distress Head: Normocephalic and atraumatic Eyes:  sclerae anicteric, conjunctiva pink  Ears: Normal auditory acuity Lungs: Clear throughout to auscultation Heart: Regular rate and rhythm Abdomen: Soft, non distended, non-tender. No masses, no hepatomegaly. Normal bowel sounds Rectal: deferred  Musculoskeletal: Symmetrical with no gross deformities  Extremities: No edema  Neurological: Alert oriented x 4, grossly nonfocal Psychological:  Alert and cooperative. Normal mood and affect  Assessment and Recommendations: 1. Crohns colitis. Pt has been having increased loose stools and cramping since coming off 6MP. Will restart her 6 MP at 75 mg daily. CBC will be checked today, and again in 2 weeks. Stool antigen panel will be obtained today as well. Continue humira. Follow up in 3-4 weeks.  20. GERD and epigastric pain. Her nausea has increased since off 6MP and she is having more frequent regurgitation. Antireflux regimen reviewed. She will continue omeprazole 40 mg qd. She will be scheduled for an EGD to evaluate for gastritis, ulcers, esophagitis, possible UGI Crohns. The risks, benefits, and alternatives to endoscopy with possible biopsy and possible dilation were discussed with the patient and they consent to proceed. Further recommendations will be made pending the findings of her EGD.        Ms.E@ PA-C @TD

## 2014-03-18 NOTE — Progress Notes (Signed)
Reviewed and agree with management plan.  Elissia Spiewak T. Ailea Rhatigan, MD FACG 

## 2014-03-19 ENCOUNTER — Encounter: Payer: Self-pay | Admitting: Gastroenterology

## 2014-03-19 ENCOUNTER — Ambulatory Visit (AMBULATORY_SURGERY_CENTER): Admitting: Gastroenterology

## 2014-03-19 VITALS — BP 151/83 | HR 54 | Temp 98.3°F | Resp 15

## 2014-03-19 DIAGNOSIS — K219 Gastro-esophageal reflux disease without esophagitis: Secondary | ICD-10-CM

## 2014-03-19 DIAGNOSIS — IMO0001 Reserved for inherently not codable concepts without codable children: Secondary | ICD-10-CM

## 2014-03-19 DIAGNOSIS — R1110 Vomiting, unspecified: Secondary | ICD-10-CM

## 2014-03-19 DIAGNOSIS — R1013 Epigastric pain: Secondary | ICD-10-CM

## 2014-03-19 MED ORDER — SODIUM CHLORIDE 0.9 % IV SOLN: 500.0000 mL | INTRAVENOUS | Status: DC

## 2014-03-19 NOTE — Progress Notes (Signed)
Called to room to assist during endoscopic procedure.  Patient ID and intended procedure confirmed with present staff. Received instructions for my participation in the procedure from the performing physician.  

## 2014-03-19 NOTE — Op Note (Signed)
[place]  [address]. [place], [address]   ENDOSCOPY PROCEDURE REPORT  PATIENT: Brianna, Santana  MR#: [id number] BIRTHDATE: 05/16/1956 , 44  yrs. old GENDER: female ENDOSCOPIST: Ladene Artist, MD, [facility] PROCEDURE DATE:  03/19/2014 PROCEDURE:  EGD w/ biopsy ASA CLASS:     Class II INDICATIONS:  history of esophageal reflux and epigastric abdominal pain. MEDICATIONS: Monitored anesthesia care and Propofol 100 mg IV TOPICAL ANESTHETIC: none DESCRIPTION OF PROCEDURE: After the risks benefits and alternatives of the procedure were thoroughly explained, informed consent was obtained.  The LB [id number] endoscope was introduced through the mouth and advanced to the second portion of the duodenum , Without limitations.  The instrument was slowly withdrawn as the mucosa was fully examined.  ESOPHAGUS: The z-line was located 40cm from the incisors.  The z-line appeared variable. Multiple biopsies were obtained. The esophagus was otherwise normal. STOMACH: The mucosa and folds of the stomach appeared normal. DUODENUM: The duodenal mucosa showed no abnormalities in the bulb and 2nd part of the duodenum.  Retroflexed views revealed no abnormalities.     The scope was then withdrawn from the patient and the procedure completed.  COMPLICATIONS: There were no immediate complications.  ENDOSCOPIC IMPRESSION: 1.   Variable z-line at 40cm from the incisors 2.   The EGD was otherwise normal.  RECOMMENDATIONS: 1.  Anti-reflux regimen 2.  Await pathology results 3.  PPI bid: omeprazole 40 mg po bid, 1 year of refills 4.  OP follow-up in 3-4 weeks.  eSigned:  Ladene Artist, MD, [facility] 03/19/2014 1:49 PM

## 2014-03-19 NOTE — Progress Notes (Signed)
Stable to RR 

## 2014-03-19 NOTE — Patient Instructions (Signed)
YOU HAD AN ENDOSCOPIC PROCEDURE TODAY AT [facility]: Refer to the procedure report that was given to you for any specific questions about what was found during the examination.  If the procedure report does not answer your questions, please call your gastroenterologist to clarify.  If you requested that your care partner not be given the details of your procedure findings, then the procedure report has been included in a sealed envelope for you to review at your convenience later.  YOU SHOULD EXPECT: Some feelings of bloating in the abdomen. Passage of more gas than usual.  Walking can help get rid of the air that was put into your GI tract during the procedure and reduce the bloating. If you had a lower endoscopy (such as a colonoscopy or flexible sigmoidoscopy) you may notice spotting of blood in your stool or on the toilet paper. If you underwent a bowel prep for your procedure, then you may not have a normal bowel movement for a few days.  DIET: Your first meal following the procedure should be a light meal and then it is ok to progress to your normal diet.  A half-sandwich or bowl of soup is an example of a good first meal.  Heavy or fried foods are harder to digest and may make you feel nauseous or bloated.  Likewise meals heavy in dairy and vegetables can cause extra gas to form and this can also increase the bloating.  Drink plenty of fluids but you should avoid alcoholic beverages for 24 hours.  ACTIVITY: Your care partner should take you home directly after the procedure.  You should plan to take it easy, moving slowly for the rest of the day.  You can resume normal activity the day after the procedure however you should NOT DRIVE or use heavy machinery for 24 hours (because of the sedation medicines used during the test).    SYMPTOMS TO REPORT IMMEDIATELY: A gastroenterologist can be reached at any hour.  During normal business hours, 8:30 AM to 5:00 PM Monday through Friday,  call [phone].  After hours and on weekends, please call the GI answering service at [phone] who will take a message and have the physician on call contact you.    Following upper endoscopy (EGD)  Vomiting of blood or coffee ground material  New chest pain or pain under the shoulder blades  Painful or persistently difficult swallowing  New shortness of breath  Fever of 100F or higher  Black, tarry-looking stools  FOLLOW UP: If any biopsies were taken you will be contacted by phone or by letter within the next 1-3 weeks.  Call your gastroenterologist if you have not heard about the biopsies in 3 weeks.  Our staff will call the home number listed on your records the next business day following your procedure to check on you and address any questions or concerns that you may have at that time regarding the information given to you following your procedure. This is a courtesy call and so if there is no answer at the home number and we have not heard from you through the emergency physician on call, we will assume that you have returned to your regular daily activities without incident.  SIGNATURES/CONFIDENTIALITY: You and/or your care partner have signed paperwork which will be entered into your electronic medical record.  These signatures attest to the fact that that the information above on your After Visit Summary has been reviewed and is understood.  Full  responsibility of the confidentiality of this discharge information lies with you and/or your care-partner.  Anti-reflux regimen-handout given  Omeprazole 40 mg twice a day.  Office follow-up in 3-4 weeks.

## 2014-03-20 ENCOUNTER — Telehealth: Payer: Self-pay | Admitting: *Deleted

## 2014-03-20 NOTE — Telephone Encounter (Signed)
  Follow up Call-  Call back number 03/19/2014 09/18/2013  Post procedure Call Back phone  # [phone] hm [phone]  Permission to leave phone message Yes Yes     Patient questions:  Do you have a fever, pain , or abdominal swelling? No. Pain Score  0 *  Have you tolerated food without any problems? Yes.    Have you been able to return to your normal activities? Yes.    Do you have any questions about your discharge instructions: Diet   No. Medications  No. Follow up visit  No.  Do you have questions or concerns about your Care? No.  Actions: * If pain score is 4 or above: No action needed, pain <4.

## 2014-03-21 ENCOUNTER — Other Ambulatory Visit

## 2014-03-21 DIAGNOSIS — K5090 Crohn's disease, unspecified, without complications: Secondary | ICD-10-CM

## 2014-03-21 DIAGNOSIS — R1013 Epigastric pain: Secondary | ICD-10-CM

## 2014-03-22 LAB — GASTROINTESTINAL PATHOGEN PANEL PCR
C. difficile Tox A/B, PCR: NEGATIVE
Campylobacter, PCR: NEGATIVE
Cryptosporidium, PCR: NEGATIVE
E coli (ETEC) LT/ST PCR: NEGATIVE
E coli (STEC) stx1/stx2, PCR: NEGATIVE
E coli 0157, PCR: NEGATIVE
Giardia lamblia, PCR: NEGATIVE
Norovirus, PCR: NEGATIVE
Rotavirus A, PCR: NEGATIVE
Salmonella, PCR: NEGATIVE
Shigella, PCR: NEGATIVE

## 2014-03-25 ENCOUNTER — Encounter: Payer: Self-pay | Admitting: Gastroenterology

## 2014-03-27 ENCOUNTER — Ambulatory Visit: Admitting: Gastroenterology

## 2014-03-29 ENCOUNTER — Other Ambulatory Visit: Payer: Self-pay | Admitting: Gastroenterology

## 2014-04-01 ENCOUNTER — Other Ambulatory Visit: Payer: Self-pay

## 2014-04-01 ENCOUNTER — Ambulatory Visit (INDEPENDENT_AMBULATORY_CARE_PROVIDER_SITE_OTHER): Admitting: Gastroenterology

## 2014-04-01 ENCOUNTER — Other Ambulatory Visit (INDEPENDENT_AMBULATORY_CARE_PROVIDER_SITE_OTHER)

## 2014-04-01 ENCOUNTER — Encounter: Payer: Self-pay | Admitting: Gastroenterology

## 2014-04-01 VITALS — BP 112/68 | HR 68 | Ht 64.0 in | Wt 179.2 lb

## 2014-04-01 DIAGNOSIS — K5010 Crohn's disease of large intestine without complications: Secondary | ICD-10-CM

## 2014-04-01 LAB — CBC WITH DIFFERENTIAL/PLATELET
Basophils Absolute: 0 10*3/uL (ref 0.0–0.1)
Basophils Relative: 1.5 % (ref 0.0–3.0)
Eosinophils Absolute: 0.2 10*3/uL (ref 0.0–0.7)
Eosinophils Relative: 5.7 % — ABNORMAL HIGH (ref 0.0–5.0)
HCT: 39.7 % (ref 36.0–46.0)
Hemoglobin: 13 g/dL (ref 12.0–15.0)
Lymphocytes Relative: 33.3 % (ref 12.0–46.0)
Lymphs Abs: 1.1 10*3/uL (ref 0.7–4.0)
MCHC: 32.8 g/dL (ref 30.0–36.0)
MCV: 98.1 fl (ref 78.0–100.0)
Monocytes Absolute: 0.2 10*3/uL (ref 0.1–1.0)
Monocytes Relative: 7.2 % (ref 3.0–12.0)
Neutro Abs: 1.7 10*3/uL (ref 1.4–7.7)
Neutrophils Relative %: 52.3 % (ref 43.0–77.0)
Platelets: 236 10*3/uL (ref 150.0–400.0)
RBC: 4.05 Mil/uL (ref 3.87–5.11)
RDW: 13.2 % (ref 11.5–15.5)
WBC: 3.3 10*3/uL — ABNORMAL LOW (ref 4.0–10.5)

## 2014-04-01 LAB — COMPREHENSIVE METABOLIC PANEL
ALT: 43 U/L — ABNORMAL HIGH (ref 0–35)
AST: 36 U/L (ref 0–37)
Albumin: 3.6 g/dL (ref 3.5–5.2)
Alkaline Phosphatase: 77 U/L (ref 39–117)
BUN: 11 mg/dL (ref 6–23)
CO2: 25 mEq/L (ref 19–32)
Calcium: 9.6 mg/dL (ref 8.4–10.5)
Chloride: 106 mEq/L (ref 96–112)
Creatinine, Ser: 0.8 mg/dL (ref 0.4–1.2)
GFR: 93.22 mL/min (ref >60.00)
Glucose, Bld: 58 mg/dL — ABNORMAL LOW (ref 70–99)
Potassium: 3.8 mEq/L (ref 3.5–5.1)
Sodium: 141 mEq/L (ref 135–145)
Total Bilirubin: 1.5 mg/dL — ABNORMAL HIGH (ref 0.2–1.2)
Total Protein: 7.6 g/dL (ref 6.0–8.3)

## 2014-04-01 LAB — LIPASE: Lipase: 29 U/L (ref 11.0–59.0)

## 2014-04-01 MED ORDER — MERCAPTOPURINE 50 MG PO TABS: ORAL_TABLET | ORAL | Status: DC

## 2014-04-01 NOTE — Patient Instructions (Addendum)
Your physician has requested that you go to the basement for the following lab work before leaving today: CBC, CMET, Lipase  Please follow up with Dr Fuller Plan in 3 months.

## 2014-04-01 NOTE — Progress Notes (Signed)
    History of Present Illness: This is a 58 year old female returning for follow-up of GERD and Crohn's disease. Her epigastric pain and reflux symptoms have resolved on a higher dose of omeprazole. Recent upper endoscopy showed changes consistent with GERD. Her diarrhea has resolved since resuming 6-MP.  She has no gastrointestinal complaints today. She feels very well.  Current Medications, Allergies, Past Medical History, Past Surgical History, Family History and Social History were reviewed in [employer] record.  Physical Exam: General: Well developed , well nourished, no acute distress Head: Normocephalic and atraumatic Eyes:  sclerae anicteric, EOMI Ears: Normal auditory acuity Mouth: No deformity or lesions Lungs: Clear throughout to auscultation Heart: Regular rate and rhythm; no murmurs, rubs or bruits Abdomen: Soft, non tender and non distended. No masses, hepatosplenomegaly or hernias noted. Normal Bowel sounds Musculoskeletal: Symmetrical with no gross deformities  Pulses:  Normal pulses noted Extremities: No clubbing, cyanosis, edema or deformities noted Neurological: Alert oriented x 4, grossly nonfocal Psychological:  Alert and cooperative. Normal mood and affect  Assessment and Recommendations:  1. Crohns colitis. 6 MP 75 mg daily restarted last month. CBC, CMET, lipase today. Continue Humira. Follow up in 3 months.  2. GERD and epigastric pain, symptoms under control. Antireflux regimen reviewed. Continue omeprazole 40 mg qd.

## 2014-05-06 ENCOUNTER — Encounter: Payer: Self-pay | Admitting: Internal Medicine

## 2014-05-06 ENCOUNTER — Ambulatory Visit (INDEPENDENT_AMBULATORY_CARE_PROVIDER_SITE_OTHER): Admitting: Internal Medicine

## 2014-05-06 ENCOUNTER — Other Ambulatory Visit (INDEPENDENT_AMBULATORY_CARE_PROVIDER_SITE_OTHER)

## 2014-05-06 VITALS — BP 120/80 | HR 87 | Temp 98.3°F | Resp 20 | Ht 64.0 in | Wt 180.0 lb

## 2014-05-06 DIAGNOSIS — Z8639 Personal history of other endocrine, nutritional and metabolic disease: Secondary | ICD-10-CM | POA: Diagnosis not present

## 2014-05-06 DIAGNOSIS — Z0000 Encounter for general adult medical examination without abnormal findings: Secondary | ICD-10-CM

## 2014-05-06 DIAGNOSIS — E0590 Thyrotoxicosis, unspecified without thyrotoxic crisis or storm: Secondary | ICD-10-CM

## 2014-05-06 DIAGNOSIS — K219 Gastro-esophageal reflux disease without esophagitis: Secondary | ICD-10-CM

## 2014-05-06 DIAGNOSIS — J4520 Mild intermittent asthma, uncomplicated: Secondary | ICD-10-CM

## 2014-05-06 LAB — LIPID PANEL
Cholesterol: 200 mg/dL (ref 0–200)
HDL: 75.2 mg/dL (ref >39.00)
LDL Cholesterol: 118 mg/dL — ABNORMAL HIGH (ref 0–99)
NonHDL: 124.8
Total CHOL/HDL Ratio: 3
Triglycerides: 35 mg/dL (ref 0.0–149.0)
VLDL: 7 mg/dL (ref 0.0–40.0)

## 2014-05-06 LAB — T4, FREE: Free T4: 0.94 ng/dL (ref 0.60–1.60)

## 2014-05-06 LAB — TSH: TSH: 1.27 u[IU]/mL (ref 0.35–4.50)

## 2014-05-06 NOTE — Assessment & Plan Note (Signed)
Check lipid panel  

## 2014-05-06 NOTE — Assessment & Plan Note (Signed)
She is having minimal symptoms since increase of dose of omeprazole.

## 2014-05-06 NOTE — Assessment & Plan Note (Signed)
Status post radioactive iodine ablation. Check free T4 as well as TSH today.

## 2014-05-06 NOTE — Patient Instructions (Addendum)
We will check on your thyroid today. We will call you back with those results.  We will see back in about 6-12 months. If you have any problems or questions before then please feel free to call our office.  Health Maintenance Adopting a healthy lifestyle and getting preventive care can go a long way to promote health and wellness. Talk with your health care provider about what schedule of regular examinations is right for you. This is a good chance for you to check in with your provider about disease prevention and staying healthy. In between checkups, there are plenty of things you can do on your own. Experts have done a lot of research about which lifestyle changes and preventive measures are most likely to keep you healthy. Ask your health care provider for more information. WEIGHT AND DIET  Eat a healthy diet  Be sure to include plenty of vegetables, fruits, low-fat dairy products, and lean protein.  Do not eat a lot of foods high in solid fats, added sugars, or salt.  Get regular exercise. This is one of the most important things you can do for your health.  Most adults should exercise for at least 150 minutes each week. The exercise should increase your heart rate and make you sweat (moderate-intensity exercise).  Most adults should also do strengthening exercises at least twice a week. This is in addition to the moderate-intensity exercise.  Maintain a healthy weight  Body mass index (BMI) is a measurement that can be used to identify possible weight problems. It estimates body fat based on height and weight. Your health care provider can help determine your BMI and help you achieve or maintain a healthy weight.  For females 64 years of age and older:   A BMI below 18.5 is considered underweight.  A BMI of 18.5 to 24.9 is normal.  A BMI of 25 to 29.9 is considered overweight.  A BMI of 30 and above is considered obese.  Watch levels of cholesterol and blood lipids  You  should start having your blood tested for lipids and cholesterol at 58 years of age, then have this test every 5 years.  You may need to have your cholesterol levels checked more often if:  Your lipid or cholesterol levels are high.  You are older than 58 years of age.  You are at high risk for heart disease.  CANCER SCREENING   Lung Cancer  Lung cancer screening is recommended for adults 64-46 years old who are at high risk for lung cancer because of a history of smoking.  A yearly low-dose CT scan of the lungs is recommended for people who:  Currently smoke.  Have quit within the past 15 years.  Have at least a 30-pack-year history of smoking. A pack year is smoking an average of one pack of cigarettes a day for 1 year.  Yearly screening should continue until it has been 15 years since you quit.  Yearly screening should stop if you develop a health problem that would prevent you from having lung cancer treatment.  Breast Cancer  Practice breast self-awareness. This means understanding how your breasts normally appear and feel.  It also means doing regular breast self-exams. Let your health care provider know about any changes, no matter how small.  If you are in your 20s or 30s, you should have a clinical breast exam (CBE) by a health care provider every 1-3 years as part of a regular health exam.  If you  are 40 or older, have a CBE every year. Also consider having a breast X-ray (mammogram) every year.  If you have a family history of breast cancer, talk to your health care provider about genetic screening.  If you are at high risk for breast cancer, talk to your health care provider about having an MRI and a mammogram every year.  Breast cancer gene (BRCA) assessment is recommended for women who have family members with BRCA-related cancers. BRCA-related cancers include:  Breast.  Ovarian.  Tubal.  Peritoneal cancers.  Results of the assessment will determine  the need for genetic counseling and BRCA1 and BRCA2 testing. Cervical Cancer Routine pelvic examinations to screen for cervical cancer are no longer recommended for nonpregnant women who are considered low risk for cancer of the pelvic organs (ovaries, uterus, and vagina) and who do not have symptoms. A pelvic examination may be necessary if you have symptoms including those associated with pelvic infections. Ask your health care provider if a screening pelvic exam is right for you.   The Pap test is the screening test for cervical cancer for women who are considered at risk.  If you had a hysterectomy for a problem that was not cancer or a condition that could lead to cancer, then you no longer need Pap tests.  If you are older than 65 years, and you have had normal Pap tests for the past 10 years, you no longer need to have Pap tests.  If you have had past treatment for cervical cancer or a condition that could lead to cancer, you need Pap tests and screening for cancer for at least 20 years after your treatment.  If you no longer get a Pap test, assess your risk factors if they change (such as having a new sexual partner). This can affect whether you should start being screened again.  Some women have medical problems that increase their chance of getting cervical cancer. If this is the case for you, your health care provider may recommend more frequent screening and Pap tests.  The human papillomavirus (HPV) test is another test that may be used for cervical cancer screening. The HPV test looks for the virus that can cause cell changes in the cervix. The cells collected during the Pap test can be tested for HPV.  The HPV test can be used to screen women 30 years of age and older. Getting tested for HPV can extend the interval between normal Pap tests from three to five years.  An HPV test also should be used to screen women of any age who have unclear Pap test results.  After 58 years of  age, women should have HPV testing as often as Pap tests.  Colorectal Cancer  This type of cancer can be detected and often prevented.  Routine colorectal cancer screening usually begins at 58 years of age and continues through 58 years of age.  Your health care provider may recommend screening at an earlier age if you have risk factors for colon cancer.  Your health care provider may also recommend using home test kits to check for hidden blood in the stool.  A small camera at the end of a tube can be used to examine your colon directly (sigmoidoscopy or colonoscopy). This is done to check for the earliest forms of colorectal cancer.  Routine screening usually begins at age 50.  Direct examination of the colon should be repeated every 5-10 years through 58 years of age. However, you may   need to be screened more often if early forms of precancerous polyps or small growths are found. Skin Cancer  Check your skin from head to toe regularly.  Tell your health care provider about any new moles or changes in moles, especially if there is a change in a mole's shape or color.  Also tell your health care provider if you have a mole that is larger than the size of a pencil eraser.  Always use sunscreen. Apply sunscreen liberally and repeatedly throughout the day.  Protect yourself by wearing long sleeves, pants, a wide-brimmed hat, and sunglasses whenever you are outside. HEART DISEASE, DIABETES, AND HIGH BLOOD PRESSURE   Have your blood pressure checked at least every 1-2 years. High blood pressure causes heart disease and increases the risk of stroke.  If you are between 55 years and 79 years old, ask your health care provider if you should take aspirin to prevent strokes.  Have regular diabetes screenings. This involves taking a blood sample to check your fasting blood sugar level.  If you are at a normal weight and have a low risk for diabetes, have this test once every three years  after 58 years of age.  If you are overweight and have a high risk for diabetes, consider being tested at a younger age or more often. PREVENTING INFECTION  Hepatitis B  If you have a higher risk for hepatitis B, you should be screened for this virus. You are considered at high risk for hepatitis B if:  You were born in a country where hepatitis B is common. Ask your health care provider which countries are considered high risk.  Your parents were born in a high-risk country, and you have not been immunized against hepatitis B (hepatitis B vaccine).  You have HIV or AIDS.  You use needles to inject street drugs.  You live with someone who has hepatitis B.  You have had sex with someone who has hepatitis B.  You get hemodialysis treatment.  You take certain medicines for conditions, including cancer, organ transplantation, and autoimmune conditions. Hepatitis C  Blood testing is recommended for:  Everyone born from 1945 through 1965.  Anyone with known risk factors for hepatitis C. Sexually transmitted infections (STIs)  You should be screened for sexually transmitted infections (STIs) including gonorrhea and chlamydia if:  You are sexually active and are younger than 58 years of age.  You are older than 58 years of age and your health care provider tells you that you are at risk for this type of infection.  Your sexual activity has changed since you were last screened and you are at an increased risk for chlamydia or gonorrhea. Ask your health care provider if you are at risk.  If you do not have HIV, but are at risk, it may be recommended that you take a prescription medicine daily to prevent HIV infection. This is called pre-exposure prophylaxis (PrEP). You are considered at risk if:  You are sexually active and do not regularly use condoms or know the HIV status of your partner(s).  You take drugs by injection.  You are sexually active with a partner who has  HIV. Talk with your health care provider about whether you are at high risk of being infected with HIV. If you choose to begin PrEP, you should first be tested for HIV. You should then be tested every 3 months for as long as you are taking PrEP.  PREGNANCY   If you are   premenopausal and you may become pregnant, ask your health care provider about preconception counseling.  If you may become pregnant, take 400 to 800 micrograms (mcg) of folic acid every day.  If you want to prevent pregnancy, talk to your health care provider about birth control (contraception). OSTEOPOROSIS AND MENOPAUSE   Osteoporosis is a disease in which the bones lose minerals and strength with aging. This can result in serious bone fractures. Your risk for osteoporosis can be identified using a bone density scan.  If you are 27 years of age or older, or if you are at risk for osteoporosis and fractures, ask your health care provider if you should be screened.  Ask your health care provider whether you should take a calcium or vitamin D supplement to lower your risk for osteoporosis.  Menopause may have certain physical symptoms and risks.  Hormone replacement therapy may reduce some of these symptoms and risks. Talk to your health care provider about whether hormone replacement therapy is right for you.  HOME CARE INSTRUCTIONS   Schedule regular health, dental, and eye exams.  Stay current with your immunizations.   Do not use any tobacco products including cigarettes, chewing tobacco, or electronic cigarettes.  If you are pregnant, do not drink alcohol.  If you are breastfeeding, limit how much and how often you drink alcohol.  Limit alcohol intake to no more than 1 drink per day for nonpregnant women. One drink equals 12 ounces of beer, 5 ounces of wine, or 1 ounces of hard liquor.  Do not use street drugs.  Do not share needles.  Ask your health care provider for help if you need support or  information about quitting drugs.  Tell your health care provider if you often feel depressed.  Tell your health care provider if you have ever been abused or do not feel safe at home. Document Released: 11/30/2010 Document Revised: 10/01/2013 Document Reviewed: 04/18/2013 [facility] Patient Information 2015 [place], [place]. This information is not intended to replace advice given to you by your health care provider. Make sure you discuss any questions you have with your health care provider.

## 2014-05-06 NOTE — Assessment & Plan Note (Signed)
Doing well at this time and not having to use her in inhaler much. She does use Symbicort daily and albuterol when necessary. She has never been intubated with her asthma and has not been hospitalized in many years for asthma.

## 2014-05-06 NOTE — Progress Notes (Signed)
   Subjective:    Patient ID: Brianna Santana, female    DOB: [birth year], 58 y.o.   MRN: [id number]  HPI The patient is a 58 year old female who comes in today to establish care. She has had past medical history of Crohn's disease, GERD, asthma, osteoarthritis. She has been having some back pains for which she has been using gabapentin successfully. She is doing very well with her Crohn's since going back on 6-MP. She denies any abdominal symptoms such as pain bloating diarrhea. She does not have any blood in her stools. She denies any chest pain, shortness of breath, joint pain or swelling.  Review of Systems  Constitutional: Negative for fever, activity change, appetite change, fatigue and unexpected weight change.  HENT: Negative.   Eyes: Negative.   Respiratory: Negative for cough, chest tightness, shortness of breath and wheezing.   Cardiovascular: Negative for chest pain, palpitations and leg swelling.  Gastrointestinal: Negative for nausea, abdominal pain, diarrhea, constipation, blood in stool and abdominal distention.  Genitourinary: Negative.   Musculoskeletal: Positive for back pain. Negative for myalgias, arthralgias and gait problem.  Skin: Negative.   Neurological: Negative.       Objective:   Physical Exam  Constitutional: She is oriented to person, place, and time. She appears well-developed and well-nourished.  HENT:  Head: Normocephalic and atraumatic.  Eyes: EOM are normal.  Neck: Normal range of motion.  Cardiovascular: Normal rate and regular rhythm.   Pulmonary/Chest: Effort normal and breath sounds normal. No respiratory distress. She has no wheezes. She has no rales.  Abdominal: Soft. Bowel sounds are normal. She exhibits no distension. There is no tenderness. There is no rebound.  Neurological: She is alert and oriented to person, place, and time. Coordination normal.  Skin: Skin is warm and dry.   Filed Vitals:   05/06/14 1053  BP: 120/80  Pulse: 87    Temp: 98.3 F (36.8 C)  TempSrc: Oral  Resp: 20  Height: 5' 4"  (1.626 m)  Weight: 180 lb (81.647 kg)  SpO2: 96%      Assessment & Plan:

## 2014-05-06 NOTE — Progress Notes (Signed)
Pre visit review using our clinic review tool, if applicable. No additional management support is needed unless otherwise documented below in the visit note. 

## 2014-05-08 ENCOUNTER — Telehealth: Payer: Self-pay | Admitting: Internal Medicine

## 2014-05-08 NOTE — Telephone Encounter (Signed)
Received medical records, 39 pages, from [facility]. Sent to Dr. Doug Sou. 05/08/14/ss

## 2014-05-15 ENCOUNTER — Other Ambulatory Visit: Payer: Self-pay | Admitting: Gastroenterology

## 2014-05-18 ENCOUNTER — Other Ambulatory Visit: Payer: Self-pay | Admitting: Gastroenterology

## 2014-05-20 ENCOUNTER — Telehealth: Payer: Self-pay | Admitting: Gastroenterology

## 2014-05-20 NOTE — Telephone Encounter (Signed)
Brianna Santana notified that casual contact is not a concern for transmission of MRSA.  Her mother-in-law has MRSA and she visited with her this weekend.  She will call back for any additional questions or concerns

## 2014-05-27 ENCOUNTER — Other Ambulatory Visit: Payer: Self-pay | Admitting: Gastroenterology

## 2014-06-04 ENCOUNTER — Encounter: Payer: Self-pay | Admitting: Internal Medicine

## 2014-06-18 ENCOUNTER — Telehealth: Payer: Self-pay | Admitting: Gastroenterology

## 2014-06-18 MED ORDER — OMEPRAZOLE 40 MG PO CPDR: DELAYED_RELEASE_CAPSULE | ORAL | Status: DC

## 2014-06-18 MED ORDER — ADALIMUMAB 40 MG/0.8ML ~~LOC~~ AJKT: 40.0000 mg | AUTO-INJECTOR | SUBCUTANEOUS | Status: DC

## 2014-06-18 MED ORDER — MERCAPTOPURINE 50 MG PO TABS: ORAL_TABLET | ORAL | Status: DC

## 2014-06-18 NOTE — Telephone Encounter (Signed)
Patient states it is much cheaper for her to get her prescriptions through [facility] now instead of [facility]. Patient would like 90 day supply of all her prescriptions. Sent all prescriptions electronically to [facility]. Called patient to notify her to call us back though if she does not receive her Humira by Sunday because she will run out. Patient states she will. Patient gave me another number to call to [facility] [phone]. Patient states her medication will not go to [facility] but to [place] instead. I told patient I will call that number just to verify it goes to the right place. [place] and they state that all medications need to be sent to the [place] location and will be filtered out to other states.

## 2014-07-09 ENCOUNTER — Encounter: Payer: Self-pay | Admitting: Gastroenterology

## 2014-07-09 ENCOUNTER — Other Ambulatory Visit (INDEPENDENT_AMBULATORY_CARE_PROVIDER_SITE_OTHER)

## 2014-07-09 ENCOUNTER — Ambulatory Visit (INDEPENDENT_AMBULATORY_CARE_PROVIDER_SITE_OTHER): Admitting: Gastroenterology

## 2014-07-09 VITALS — BP 110/76 | HR 76 | Ht 64.0 in | Wt 183.8 lb

## 2014-07-09 DIAGNOSIS — R7401 Elevation of levels of liver transaminase levels: Secondary | ICD-10-CM

## 2014-07-09 DIAGNOSIS — R740 Nonspecific elevation of levels of transaminase and lactic acid dehydrogenase [LDH]: Secondary | ICD-10-CM

## 2014-07-09 DIAGNOSIS — K5010 Crohn's disease of large intestine without complications: Secondary | ICD-10-CM

## 2014-07-09 DIAGNOSIS — K219 Gastro-esophageal reflux disease without esophagitis: Secondary | ICD-10-CM

## 2014-07-09 DIAGNOSIS — Z79899 Other long term (current) drug therapy: Secondary | ICD-10-CM

## 2014-07-09 LAB — CBC WITH DIFFERENTIAL/PLATELET
Basophils Absolute: 0 10*3/uL (ref 0.0–0.1)
Basophils Relative: 0.7 % (ref 0.0–3.0)
Eosinophils Absolute: 0.2 10*3/uL (ref 0.0–0.7)
Eosinophils Relative: 4.5 % (ref 0.0–5.0)
HCT: 37.8 % (ref 36.0–46.0)
Hemoglobin: 12.9 g/dL (ref 12.0–15.0)
Lymphocytes Relative: 31.1 % (ref 12.0–46.0)
Lymphs Abs: 1.2 10*3/uL (ref 0.7–4.0)
MCHC: 34.2 g/dL (ref 30.0–36.0)
MCV: 93.6 fl (ref 78.0–100.0)
Monocytes Absolute: 0.4 10*3/uL (ref 0.1–1.0)
Monocytes Relative: 10.5 % (ref 3.0–12.0)
Neutro Abs: 2.1 10*3/uL (ref 1.4–7.7)
Neutrophils Relative %: 53.2 % (ref 43.0–77.0)
Platelets: 284 10*3/uL (ref 150.0–400.0)
RBC: 4.04 Mil/uL (ref 3.87–5.11)
RDW: 14.2 % (ref 11.5–15.5)
WBC: 3.9 10*3/uL — ABNORMAL LOW (ref 4.0–10.5)

## 2014-07-09 LAB — COMPREHENSIVE METABOLIC PANEL
ALT: 27 U/L (ref 0–35)
AST: 23 U/L (ref 0–37)
Albumin: 4.2 g/dL (ref 3.5–5.2)
Alkaline Phosphatase: 78 U/L (ref 39–117)
BUN: 14 mg/dL (ref 6–23)
CO2: 30 mEq/L (ref 19–32)
Calcium: 9.6 mg/dL (ref 8.4–10.5)
Chloride: 106 mEq/L (ref 96–112)
Creatinine, Ser: 0.74 mg/dL (ref 0.40–1.20)
GFR: 103.37 mL/min (ref >60.00)
Glucose, Bld: 89 mg/dL (ref 70–99)
Potassium: 4.7 mEq/L (ref 3.5–5.1)
Sodium: 141 mEq/L (ref 135–145)
Total Bilirubin: 0.7 mg/dL (ref 0.2–1.2)
Total Protein: 7.2 g/dL (ref 6.0–8.3)

## 2014-07-09 LAB — LIPASE: Lipase: 23 U/L (ref 11.0–59.0)

## 2014-07-09 MED ORDER — MERCAPTOPURINE 50 MG PO TABS: ORAL_TABLET | ORAL | Status: DC

## 2014-07-09 NOTE — Progress Notes (Signed)
History of Present Illness: This is a 59 year old female with Crohn's colitis returning for follow-up. She continues to do well on Humira and 6-MP. Blood work in November showed a minimally elevated ALT at 43, a slightly low WBC at 3.3 and a slightly elevated eosinophil count at 5.7%. Her reflux symptoms are well controlled.  Allergies  Allergen Reactions  . Levofloxacin Other (See Comments)    Doesn't remember reaction   Outpatient Prescriptions Prior to Visit  Medication Sig Dispense Refill  . Adalimumab (HUMIRA PEN) 40 MG/0.8ML PNKT Inject 40 mg into the skin every 14 (fourteen) days. 6 each 1  . albuterol (PROAIR HFA) 108 (90 BASE) MCG/ACT inhaler Inhale 2 puffs into the lungs every 4 (four) hours as needed. For shortness of breath or wheezing    . betamethasone valerate ointment (VALISONE) 0.1 % Apply topically 2 (two) times daily. 30 g 0  . budesonide-formoterol (SYMBICORT) 80-4.5 MCG/ACT inhaler Inhale 2 puffs into the lungs 2 (two) times daily.    . Calcium Carbonate-Vitamin D (CALCIUM 600+D) 600-400 MG-UNIT per tablet Take 1 tablet by mouth 2 (two) times daily.      . cetirizine (ZYRTEC) 10 MG tablet Take 10 mg by mouth daily.    . Cholecalciferol (VITAMIN D) 2000 UNITS tablet Take 2,000 Units by mouth daily.    . clobetasol cream (TEMOVATE) 6.50 % Apply 1 application topically as needed. For rash    . gabapentin (NEURONTIN) 300 MG capsule Take 300 mg by mouth 3 (three) times daily.    . mercaptopurine (PURINETHOL) 50 MG tablet Take 1 1/2 tablet by mouth daily 135 tablet 1  . montelukast (SINGULAIR) 10 MG tablet Take 10 mg by mouth at bedtime.     . Multiple Vitamin (MULTIVITAMIN WITH MINERALS) TABS Take 1 tablet by mouth daily.    Marland Kitchen omeprazole (PRILOSEC) 40 MG capsule TAKE 1 CAPSULE BY MOUTH DAILY FOR HEARTBURN 90 capsule 1  . tiZANidine (ZANAFLEX) 4 MG capsule Take 4 mg by mouth at bedtime as needed for muscle spasms.    . vitamin B-12 (CYANOCOBALAMIN) 1000 MCG tablet Take  1,000 mcg by mouth daily.    Marland Kitchen oxyCODONE-acetaminophen (PERCOCET/ROXICET) 5-325 MG per tablet Take 1 tablet by mouth at bedtime as needed for severe pain.     No facility-administered medications prior to visit.   Past Medical History  Diagnosis Date  . Internal hemorrhoids   . Crohn's colitis   . Asthma   . Anemia   . C. difficile colitis 2011 and 07/2010  . GERD (gastroesophageal reflux disease)   . Hyperplastic polyp of intestine 09/2007  . Hyperthyroidism     S/P I-131 RX  . Asthma   . Cervical dysplasia   . Osteopenia 09/2012    T score -2.2 FRAX 4.9%/0.4%  . Allergy   . Arthritis    Past Surgical History  Procedure Laterality Date  . Tonsillectomy    . Colonoscopy    . Breast lumpectomy  1980's, 2000    Right Breast x 2  . Total abdominal hysterectomy  2000  . Colposcopy    . Flexible sigmoidoscopy  03/29/2012    Procedure: FLEXIBLE SIGMOIDOSCOPY;  Surgeon: Ladene Artist, MD,FACG;  Location: [facility] ENDOSCOPY;  Service: Endoscopy;  Laterality: N/A;   History   Social History  . Marital Status: Married    Spouse Name: N/A    Number of Children: 3  . Years of Education: N/A   Occupational History  . Retired  Social History Main Topics  . Smoking status: Former Smoker -- 10 years    Types: Cigarettes    Quit date: 09/07/1988  . Smokeless tobacco: Never Used  . Alcohol Use: No  . Drug Use: No  . Sexual Activity: Not Currently    Birth Control/ Protection: Surgical   Other Topics Concern  . None   Social History Narrative   Family History  Problem Relation Age of Onset  . Diabetes Father   . Diabetes Sister     x 2  . Hypertension Sister     x 2  . Diabetes Brother   . Hypertension Brother   . Colon cancer Neg Hx   . Esophageal cancer Neg Hx   . Pancreatic cancer Neg Hx   . Rectal cancer Neg Hx   . Stomach cancer Neg Hx      Physical Exam: General: Well developed , well nourished, no acute distress Head: Normocephalic and atraumatic Eyes:   sclerae anicteric, EOMI Ears: Normal auditory acuity Mouth: No deformity or lesions Lungs: Clear throughout to auscultation Heart: Regular rate and rhythm; no murmurs, rubs or bruits Abdomen: Soft, non tender and non distended. No masses, hepatosplenomegaly or hernias noted. Normal Bowel sounds Musculoskeletal: Symmetrical with no gross deformities  Pulses:  Normal pulses noted Extremities: No clubbing, cyanosis, edema or deformities noted Neurological: Alert oriented x 4, grossly nonfocal Psychological:  Alert and cooperative. Normal mood and affect  Assessment and Recommendations:  1. Crohns colitis. 6 MP 75 mg daily restarted in 02/2014. CBC, CMET, lipase today. Continue Humira and 6 MP at current dosages. Follow up in 3 months.  2. GERD and epigastric pain, symptoms under control. Antireflux regimen reviewed. Continue omeprazole 40 mg qd.   3. Mildly elevated transaminase. Repeat blood work today.

## 2014-07-09 NOTE — Patient Instructions (Addendum)
We have sent the following medications to your mail order pharmacy for you: mercaptopurine.   Your physician has requested that you go to the basement for the following lab work before leaving today: CBC, CMP, and Lipase.  Thank you for choosing me and [facility].  Pricilla Riffle. Dagoberto Ligas., MD., Marval Regal

## 2014-07-22 ENCOUNTER — Encounter: Payer: Self-pay | Admitting: Internal Medicine

## 2014-07-22 ENCOUNTER — Ambulatory Visit (INDEPENDENT_AMBULATORY_CARE_PROVIDER_SITE_OTHER): Admitting: Internal Medicine

## 2014-07-22 VITALS — BP 112/58 | HR 104 | Temp 98.3°F | Resp 14 | Ht 63.0 in | Wt 182.0 lb

## 2014-07-22 DIAGNOSIS — J0110 Acute frontal sinusitis, unspecified: Secondary | ICD-10-CM

## 2014-07-22 MED ORDER — TRAMADOL HCL 50 MG PO TABS: 50.0000 mg | ORAL_TABLET | Freq: Three times a day (TID) | ORAL | Status: DC | PRN

## 2014-07-22 MED ORDER — AMOXICILLIN-POT CLAVULANATE 875-125 MG PO TABS: 1.0000 | ORAL_TABLET | Freq: Two times a day (BID) | ORAL | Status: DC

## 2014-07-22 NOTE — Progress Notes (Signed)
Pre visit review using our clinic review tool, if applicable. No additional management support is needed unless otherwise documented below in the visit note. 

## 2014-07-22 NOTE — Progress Notes (Signed)
   Subjective:    Patient ID: Brianna Santana, female    DOB: 10-Nov-1955, 59 y.o.   MRN: [id number]  HPI The patient is a 59 YO female who is coming in today for sinus problem. It is a new problem going on the last 1-2 weeks. She started with fevers and is still having some chills. She is having nasal congestion, ear pain, headache, sinus pressure. She denies cough, sore throat, SOB. She has tried alleve cold and sinus with no relief. She is also taking zyrtec with no relief. She had tried a nose spray but it gave her nosebleeds and did not help much. She has had trouble with her sinuses before and generally happens several times per year.   Review of Systems  Constitutional: Positive for chills. Negative for fever, activity change, appetite change and fatigue.  HENT: Positive for congestion, ear pain and sinus pressure. Negative for ear discharge, facial swelling and sore throat.   Eyes: Negative.   Respiratory: Negative for cough, chest tightness, shortness of breath and wheezing.   Cardiovascular: Negative.   Gastrointestinal: Negative.       Objective:   Physical Exam  Constitutional: She appears well-developed and well-nourished.  HENT:  Head: Normocephalic and atraumatic.  Right Ear: External ear normal.  Left Ear: External ear normal.  Facial tenderness frontal sinuses, nose with erythema and swelling, yellow crusting. Oropharynx with redness, no exudates  Eyes: EOM are normal.  Neck: Normal range of motion.  Cardiovascular: Normal rate and regular rhythm.   Pulmonary/Chest: Effort normal and breath sounds normal. No respiratory distress. She has no wheezes. She has no rales.  Abdominal: Soft.   Filed Vitals:   07/22/14 0948  BP: 112/58  Pulse: 104  Temp: 98.3 F (36.8 C)  TempSrc: Oral  Resp: 14  Height: 5' 3"  (1.6 m)  Weight: 182 lb (82.555 kg)  SpO2: 98%      Assessment & Plan:

## 2014-07-22 NOTE — Patient Instructions (Signed)
We will send in an antibiotic called augementin which will help to clear up the sinuses. Take 1 pill twice a day for 10 days.   In the meantime we will give you some tramadol for the headaches until the sinuses are clear. You can take 1-2 pills every 8 hours for the pain. I would recommend to start with 1 pill and see how it does. I do not recommend driving after taking it until you know how it will affect you.

## 2014-07-23 DIAGNOSIS — J019 Acute sinusitis, unspecified: Secondary | ICD-10-CM | POA: Insufficient documentation

## 2014-07-23 NOTE — Assessment & Plan Note (Signed)
Duration of 2 weeks and failed conservative therapy for sinuses. Will do augmentin 10 days. Advised to continue zyrtec.

## 2014-07-24 ENCOUNTER — Telehealth: Payer: Self-pay | Admitting: Internal Medicine

## 2014-07-24 ENCOUNTER — Telehealth: Payer: Self-pay | Admitting: *Deleted

## 2014-07-24 NOTE — Telephone Encounter (Signed)
PLEASE NOTE: All timestamps contained within this report are represented as [place] Standard Time. CONFIDENTIALTY NOTICE: This fax transmission is intended only for the addressee. It contains information that is legally privileged, confidential or otherwise protected from use or disclosure. If you are not the intended recipient, you are strictly prohibited from reviewing, disclosing, copying using or disseminating any of this information or taking any action in reliance on or regarding this information. If you have received this fax in error, please notify us immediately by telephone so that we can arrange for its return to [name]. Phone: [phone], Toll-Free: [phone], Fax: [phone] Page: 1 of 1 Call Id: [id number] [facility] [place] Patient Name: Brianna Santana DOB: 1956-05-21 Initial Comment Caller States saw the dr on monday and was given tramadol for headaches but the med is making her head spin and hurt worse. wants to know if something else can be called in. has not taken the med today. Nurse Assessment Nurse: Rock Nephew, RN, Juliann Pulse Date/Time (Eastern Time): 07/24/2014 10:07:14 AM Confirm and document reason for call. If symptomatic, describe symptoms. ---Caller states she saw Dr. Doug Sou on Monday and was given Tramadol for headaches but the med is making her head spin and hurt worse. She wants to know if something else can be called in. Previously she was given Oxycodone for her headaches. Has the patient traveled out of the country within the last 30 days? ---Not Applicable Does the patient require triage? ---No Please document clinical information provided and list any resource used. ---I advised caller that I would forward a message to her doctor but to call back with further needs. Guidelines Guideline Title Affirmed Question Affirmed Notes Medication Question Call Caller requesting a NON-URGENT  new prescription or refill and triager unable to refill per unit policy Final Disposition User Call PCP within 24 Hours [place], RN, Juliann Pulse

## 2014-07-24 NOTE — Telephone Encounter (Signed)
Is there a different rx per pt request?

## 2014-07-24 NOTE — Telephone Encounter (Signed)
[facility] [place] [facility] Patient Name: Brianna Santana Gender: Female DOB: [birth year] Age: 59 Y 10 M 8 D Return Phone Number: [phone] (Primary) Address: [address] City/State/Zip: [place] [address] Client [facility] - Client Client [facility] [place] [place] Type Call Call Type Triage / Clinical Relationship To Patient Self Appointment Disposition EMR Appointment Not Necessary Return Phone Number [phone] (Primary) Chief Complaint Headache Initial Comment Caller States saw the dr on monday and was given tramadol for headaches but the med is making her head spin and hurt worse. wants to know if something else can be called in. has not taken the med today. PreDisposition Call Doctor Info pasted into Epic Yes Nurse Assessment Nurse: Rock Nephew, RN, Juliann Pulse Date/Time (Eastern Time): 07/24/2014 10:07:14 AM Confirm and document reason for call. If symptomatic, describe symptoms. ---Caller states she saw Dr. Doug Sou on Monday and was given Tramadol for headaches but the med is making her head spin and hurt worse. She wants to know if something else can be called in. Previously she was given Oxycodone for her headaches. Has the patient traveled out of the country within the last 30 days? ---Not Applicable Does the patient require triage? ---No Please document clinical information provided and list any resource used. ---I advised caller that I would forward a message to her doctor but to call back with further needs. Guidelines Guideline Title Affirmed Question Affirmed Notes Nurse Date/Time Eilene Ghazi Time) Medication Question Call Caller requesting a NON-URGENT new prescription or refill and triager unable to refill per unit policy Rock Nephew, RN, Juliann Pulse 07/24/2014 10:09:06 AM Disp. Time Eilene Ghazi Time) Disposition Final User 07/24/2014 9:23:58 AM  Send To Clinical Follow Up [facility], RN, Susie 07/24/2014 10:09:38 AM Call PCP within 24 Hours Yes Rock Nephew, RN, Juliann Pulse PLEASE NOTE: All timestamps contained within this report are represented as [place] Standard Time. CONFIDENTIALTY NOTICE: This fax transmission is intended only for the addressee. It contains information that is legally privileged, confidential or otherwise protected from use or disclosure. If you are not the intended recipient, you are strictly prohibited from reviewing, disclosing, copying using or disseminating any of this information or taking any action in reliance on or regarding this information. If you have received this fax in error, please notify us immediately by telephone so that we can arrange for its return to [name]. Phone: [phone], Toll-Free: [phone], Fax: [phone] Page: 2 of 2 Call Id: [id number] Caller Understands: Yes Disagree/Comply: Comply Care Advice Given Per Guideline CALL PCP WITHIN 24 HOURS: You need to discuss this with your doctor within the next 24 hours. * IF OFFICE WILL BE OPEN: Call the office when it opens tomorrow morning. CARE ADVICE given per Medication Question Call (Adult) guideline. After Care Instructions Given Call Event Type User Date / Time Description

## 2014-07-25 MED ORDER — ACETAMINOPHEN-CODEINE #3 300-30 MG PO TABS: 1.0000 | ORAL_TABLET | Freq: Four times a day (QID) | ORAL | Status: DC | PRN

## 2014-07-25 NOTE — Telephone Encounter (Signed)
Left msg on triage concerning msg below. Pls advise...Brianna Santana

## 2014-07-25 NOTE — Telephone Encounter (Signed)
Is she taking the flonase? Likely if the tramadol is making her feel bad the hydrocodone would be worse. We can try some tylenol #3 and will print and sign.

## 2014-07-25 NOTE — Telephone Encounter (Signed)
Tried to call patient. No answer.  Faxed to pharmacy.

## 2014-07-25 NOTE — Telephone Encounter (Signed)
Patient has called back about this today.  She is requesting Hydrocodone.

## 2014-07-30 ENCOUNTER — Ambulatory Visit (INDEPENDENT_AMBULATORY_CARE_PROVIDER_SITE_OTHER): Admitting: Family

## 2014-07-30 ENCOUNTER — Encounter: Payer: Self-pay | Admitting: Family

## 2014-07-30 VITALS — BP 130/88 | HR 64 | Temp 98.0°F | Resp 18 | Ht 63.0 in | Wt 182.4 lb

## 2014-07-30 DIAGNOSIS — Z889 Allergy status to unspecified drugs, medicaments and biological substances status: Secondary | ICD-10-CM | POA: Insufficient documentation

## 2014-07-30 MED ORDER — AZITHROMYCIN 250 MG PO TABS: ORAL_TABLET | ORAL | Status: DC

## 2014-07-30 MED ORDER — PREDNISONE 10 MG PO TABS: ORAL_TABLET | ORAL | Status: DC

## 2014-07-30 NOTE — Progress Notes (Signed)
Pre visit review using our clinic review tool, if applicable. No additional management support is needed unless otherwise documented below in the visit note. 

## 2014-07-30 NOTE — Progress Notes (Signed)
Subjective:    Patient ID: Brianna Santana, female    DOB: [birth year], 59 y.o.   MRN: [id number]  Chief Complaint  Patient presents with  . Pruritis    States that she has been itching since taking the amoxicillin thinks thats causing a reaction, no rash, no hives, just itching    HPI:  Brianna Santana is a 59 y.o. female who presents today for an acute visit.  This is a new problem. Associated symptoms of generalized itching has been going on for about 3-4 days. She was recently prescribed Augmentin for a sinus infection. Denies any rashes or hives. Notes that her face may have been slightly swollen a few days ago. Has tried benedryl which did help with the itching.    Allergies  Allergen Reactions  . Amoxicillin Itching  . Levofloxacin Other (See Comments)    Doesn't remember reaction    Current Outpatient Prescriptions on File Prior to Visit  Medication Sig Dispense Refill  . acetaminophen-codeine (TYLENOL #3) 300-30 MG per tablet Take 1 tablet by mouth every 6 (six) hours as needed for moderate pain. 30 tablet 0  . Adalimumab (HUMIRA PEN) 40 MG/0.8ML PNKT Inject 40 mg into the skin every 14 (fourteen) days. 6 each 1  . albuterol (PROAIR HFA) 108 (90 BASE) MCG/ACT inhaler Inhale 2 puffs into the lungs every 4 (four) hours as needed. For shortness of breath or wheezing    . amoxicillin-clavulanate (AUGMENTIN) 875-125 MG per tablet Take 1 tablet by mouth 2 (two) times daily. 20 tablet 0  . betamethasone valerate ointment (VALISONE) 0.1 % Apply topically 2 (two) times daily. 30 g 0  . budesonide-formoterol (SYMBICORT) 80-4.5 MCG/ACT inhaler Inhale 2 puffs into the lungs 2 (two) times daily.    . Calcium Carbonate-Vitamin D (CALCIUM 600+D) 600-400 MG-UNIT per tablet Take 1 tablet by mouth 2 (two) times daily.      . cetirizine (ZYRTEC) 10 MG tablet Take 10 mg by mouth daily.    . Cholecalciferol (VITAMIN D) 2000 UNITS tablet Take 2,000 Units by mouth daily.    . clobetasol cream  (TEMOVATE) 1.01 % Apply 1 application topically as needed. For rash    . gabapentin (NEURONTIN) 300 MG capsule Take 300 mg by mouth 3 (three) times daily.    . mercaptopurine (PURINETHOL) 50 MG tablet Take 1 1/2 tablet by mouth daily 135 tablet 1  . montelukast (SINGULAIR) 10 MG tablet Take 10 mg by mouth at bedtime.     . Multiple Vitamin (MULTIVITAMIN WITH MINERALS) TABS Take 1 tablet by mouth daily.    Marland Kitchen omeprazole (PRILOSEC) 40 MG capsule TAKE 1 CAPSULE BY MOUTH DAILY FOR HEARTBURN 90 capsule 1  . tiZANidine (ZANAFLEX) 4 MG capsule Take 4 mg by mouth at bedtime as needed for muscle spasms.    . traMADol (ULTRAM) 50 MG tablet Take 1-2 tablets (50-100 mg total) by mouth every 8 (eight) hours as needed (headache). 30 tablet 0  . vitamin B-12 (CYANOCOBALAMIN) 1000 MCG tablet Take 1,000 mcg by mouth daily.     No current facility-administered medications on file prior to visit.    Review of Systems  Respiratory: Negative for chest tightness, shortness of breath and wheezing.   Skin:       Positive for itching.       Objective:    BP 130/88 mmHg  Pulse 64  Temp(Src) 98 F (36.7 C) (Oral)  Resp 18  Ht 5' 3"  (1.6 m)  Wt 182 lb  6.4 oz (82.736 kg)  BMI 32.32 kg/m2  SpO2 97% Nursing note and vital signs reviewed.  Physical Exam  Constitutional: She is oriented to person, place, and time. She appears well-developed and well-nourished. No distress.  Patient seated in the chair and observed itching her arms.   Cardiovascular: Normal rate, regular rhythm, normal heart sounds and intact distal pulses.   Pulmonary/Chest: Effort normal and breath sounds normal.  Neurological: She is alert and oriented to person, place, and time.  Skin: Skin is warm and dry.  Redness of arms noted where patient is itching.   Psychiatric: She has a normal mood and affect. Her behavior is normal. Judgment and thought content normal.       Assessment & Plan:

## 2014-07-30 NOTE — Assessment & Plan Note (Signed)
Symptoms and exam consistent with drug allergy to amoxicillin. Stop amoxicillin. Start prednisone taper. Continue Benadryl as needed for sleep. Follow up if symptoms worsen or fail to improve. Patient instructed to go the emergency room if symptoms of shortness of breath or difficulty breathing develop.

## 2014-07-30 NOTE — Patient Instructions (Signed)
Thank you for choosing [facility].  Summary/Instructions:  Your prescription(s) have been submitted to your pharmacy or been printed and provided for you. Please take as directed and contact our office if you believe you are having problem(s) with the medication(s) or have any questions.  If your symptoms worsen or fail to improve, please contact our office for further instruction, or in case of emergency go directly to the emergency room at the closest medical facility.    Drug Allergy Allergic reactions to medicines are common. Some allergic reactions are mild. A delayed type of drug allergy that occurs 1 week or more after exposure to a medicine or vaccine is called serum sickness. A life-threatening, sudden (acute) allergic reaction that involves the whole body is called anaphylaxis. CAUSES  "True" drug allergies occur when there is an allergic reaction to a medicine. This is caused by overactivity of the immune system. First, the body becomes sensitized. The immune system is triggered by your first exposure to the medicine. Following this first exposure, future exposure to the same medicine may be life-threatening. Almost any medicine can cause an allergic reaction. Common ones are:  Penicillin.  Sulfonamides (sulfa drugs).  Local anesthetics.  X-ray dyes that contain iodine. SYMPTOMS  Common symptoms of a minor allergic reaction are:  Swelling around the mouth.  An itchy red rash or hives.  Vomiting or diarrhea. Anaphylaxis can cause swelling of the mouth and throat. This makes it difficult to breathe and swallow. Severe reactions can be fatal within seconds, even after exposure to only a trace amount of the drug that causes the reaction. HOME CARE INSTRUCTIONS   If you are unsure of what caused your reaction, keep a diary of foods and medicines used. Include the symptoms that followed. Avoid anything that causes reactions.  You may want to follow up with an allergy  specialist after the reaction has cleared in order to be tested to confirm the allergy. It is important to confirm that your reaction is an allergy, not just a side effect to the medicine. If you have a true allergy to a medicine, this may prevent that medicine and related medicines from being given to you when you are very ill.  If you have hives or a rash:  Take medicines as directed by your caregiver.  You may use an over-the-counter antihistamine (diphenhydramine) as needed.  Apply cold compresses to the skin or take baths in cool water. Avoid hot baths or showers.  If you are severely allergic:  Continuous observation after a severe reaction may be needed. Hospitalization is often required.  Wear a medical alert bracelet or necklace stating your allergy.  You and your family must learn how to use an anaphylaxis kit or give an epinephrine injection to temporarily treat an emergency allergic reaction. If you have had a severe reaction, always carry your epinephrine injection or anaphylaxis kit with you. This can be lifesaving if you have a severe reaction.  Do not drive or perform tasks after treatment until the medicines used to treat your reaction have worn off, or until your caregiver says it is okay. SEEK MEDICAL CARE IF:   You think you had an allergic reaction. Symptoms usually start within 30 minutes after exposure.  Symptoms are getting worse rather than better.  You develop new symptoms.  The symptoms that brought you to your caregiver return. SEEK IMMEDIATE MEDICAL CARE IF:   You have swelling of the mouth, difficulty breathing, or wheezing.  You have a  tight feeling in your chest or throat.  You develop hives, swelling, or itching all over your body.  You develop severe vomiting or diarrhea.  You feel faint or pass out. This is an emergency. Use your epinephrine injection or anaphylaxis kit as you have been instructed. Call for emergency medical help. Even if you  improve after the injection, you need to be examined at a hospital emergency department. MAKE SURE YOU:   Understand these instructions.  Will watch your condition.  Will get help right away if you are not doing well or get worse. Document Released: 05/17/2005 Document Revised: 08/09/2011 Document Reviewed: 10/21/2010 [facility] Patient Information 2015 [place], [place]. This information is not intended to replace advice given to you by your health care provider. Make sure you discuss any questions you have with your health care provider.

## 2014-08-01 ENCOUNTER — Ambulatory Visit: Admitting: Family

## 2014-09-13 ENCOUNTER — Ambulatory Visit (INDEPENDENT_AMBULATORY_CARE_PROVIDER_SITE_OTHER): Admitting: Women's Health

## 2014-09-13 ENCOUNTER — Encounter: Payer: Self-pay | Admitting: Women's Health

## 2014-09-13 VITALS — BP 124/80 | Ht 63.0 in | Wt 185.0 lb

## 2014-09-13 DIAGNOSIS — Z13820 Encounter for screening for osteoporosis: Secondary | ICD-10-CM | POA: Diagnosis not present

## 2014-09-13 DIAGNOSIS — M8580 Other specified disorders of bone density and structure, unspecified site: Secondary | ICD-10-CM

## 2014-09-13 DIAGNOSIS — Z01419 Encounter for gynecological examination (general) (routine) without abnormal findings: Secondary | ICD-10-CM | POA: Diagnosis not present

## 2014-09-13 NOTE — Patient Instructions (Signed)
Health Recommendations for Postmenopausal Women Respected and ongoing research has looked at the most common causes of death, disability, and poor quality of life in postmenopausal women. The causes include heart disease, diseases of blood vessels, diabetes, depression, cancer, and bone loss (osteoporosis). Many things can be done to help lower the chances of developing these and other common problems. CARDIOVASCULAR DISEASE Heart Disease: A heart attack is a medical emergency. Know the signs and symptoms of a heart attack. Below are things women can do to reduce their risk for heart disease.   Do not smoke. If you smoke, quit.  Aim for a healthy weight. Being overweight causes many preventable deaths. Eat a healthy and balanced diet and drink an adequate amount of liquids.  Get moving. Make a commitment to be more physically active. Aim for 30 minutes of activity on most, if not all days of the week.  Eat for heart health. Choose a diet that is low in saturated fat and cholesterol and eliminate trans fat. Include whole grains, vegetables, and fruits. Read and understand the labels on food containers before buying.  Know your numbers. Ask your caregiver to check your blood pressure, cholesterol (total, HDL, LDL, triglycerides) and blood glucose. Work with your caregiver on improving your entire clinical picture.  High blood pressure. Limit or stop your table salt intake (try salt substitute and food seasonings). Avoid salty foods and drinks. Read labels on food containers before buying. Eating well and exercising can help control high blood pressure. STROKE  Stroke is a medical emergency. Stroke may be the result of a blood clot in a blood vessel in the brain or by a brain hemorrhage (bleeding). Know the signs and symptoms of a stroke. To lower the risk of developing a stroke:  Avoid fatty foods.  Quit smoking.  Control your diabetes, blood pressure, and irregular heart rate. THROMBOPHLEBITIS  (BLOOD CLOT) OF THE LEG  Becoming overweight and leading a stationary lifestyle may also contribute to developing blood clots. Controlling your diet and exercising will help lower the risk of developing blood clots. CANCER SCREENING  Breast Cancer: Take steps to reduce your risk of breast cancer.  You should practice "breast self-awareness." This means understanding the normal appearance and feel of your breasts and should include breast self-examination. Any changes detected, no matter how small, should be reported to your caregiver.  After age 4, you should have a clinical breast exam (CBE) every year.  Starting at age 48, you should consider having a mammogram (breast X-ray) every year.  If you have a family history of breast cancer, talk to your caregiver about genetic screening.  If you are at high risk for breast cancer, talk to your caregiver about having an MRI and a mammogram every year.  Intestinal or Stomach Cancer: Tests to consider are a rectal exam, fecal occult blood, sigmoidoscopy, and colonoscopy. Women who are high risk may need to be screened at an earlier age and more often.  Cervical Cancer:  Beginning at age 72, you should have a Pap test every 3 years as long as the past 3 Pap tests have been normal.  If you have had past treatment for cervical cancer or a condition that could lead to cancer, you need Pap tests and screening for cancer for at least 20 years after your treatment.  If you had a hysterectomy for a problem that was not cancer or a condition that could lead to cancer, then you no longer need Pap tests.  If you are between ages 65 and 70, and you have had normal Pap tests going back 10 years, you no longer need Pap tests.  If Pap tests have been discontinued, risk factors (such as a new sexual partner) need to be reassessed to determine if screening should be resumed.  Some medical problems can increase the chance of getting cervical cancer. In these  cases, your caregiver may recommend more frequent screening and Pap tests.  Uterine Cancer: If you have vaginal bleeding after reaching menopause, you should notify your caregiver.  Ovarian Cancer: Other than yearly pelvic exams, there are no reliable tests available to screen for ovarian cancer at this time except for yearly pelvic exams.  Lung Cancer: Yearly chest X-rays can detect lung cancer and should be done on high risk women, such as cigarette smokers and women with chronic lung disease (emphysema).  Skin Cancer: A complete body skin exam should be done at your yearly examination. Avoid overexposure to the sun and ultraviolet light lamps. Use a strong sun block cream when in the sun. All of these things are important for lowering the risk of skin cancer. MENOPAUSE Menopause Symptoms: Hormone therapy products are effective for treating symptoms associated with menopause:  Moderate to severe hot flashes.  Night sweats.  Mood swings.  Headaches.  Tiredness.  Loss of sex drive.  Insomnia.  Other symptoms. Hormone replacement carries certain risks, especially in older women. Women who use or are thinking about using estrogen or estrogen with progestin treatments should discuss that with their caregiver. Your caregiver will help you understand the benefits and risks. The ideal dose of hormone replacement therapy is not known. The Food and Drug Administration (FDA) has concluded that hormone therapy should be used only at the lowest doses and for the shortest amount of time to reach treatment goals.  OSTEOPOROSIS Protecting Against Bone Loss and Preventing Fracture If you use hormone therapy for prevention of bone loss (osteoporosis), the risks for bone loss must outweigh the risk of the therapy. Ask your caregiver about other medications known to be safe and effective for preventing bone loss and fractures. To guard against bone loss or fractures, the following is recommended:  If  you are younger than age 50, take 1000 mg of calcium and at least 600 mg of Vitamin D per day.  If you are older than age 50 but younger than age 70, take 1200 mg of calcium and at least 600 mg of Vitamin D per day.  If you are older than age 70, take 1200 mg of calcium and at least 800 mg of Vitamin D per day. Smoking and excessive alcohol intake increases the risk of osteoporosis. Eat foods rich in calcium and vitamin D and do weight bearing exercises several times a week as your caregiver suggests. DIABETES Diabetes Mellitus: If you have type I or type 2 diabetes, you should keep your blood sugar under control with diet, exercise, and recommended medication. Avoid starchy and fatty foods, and too many sweets. Being overweight can make diabetes control more difficult. COGNITION AND MEMORY Cognition and Memory: Menopausal hormone therapy is not recommended for the prevention of cognitive disorders such as Alzheimer's disease or memory loss.  DEPRESSION  Depression may occur at any age, but it is common in elderly women. This may be because of physical, medical, social (loneliness), or financial problems and needs. If you are experiencing depression because of medical problems and control of symptoms, talk to your caregiver about this. Physical   activity and exercise may help with mood and sleep. Community and volunteer involvement may improve your sense of value and worth. If you have depression and you feel that the problem is getting worse or becoming severe, talk to your caregiver about which treatment options are best for you. ACCIDENTS  Accidents are common and can be serious in elderly woman. Prepare your house to prevent accidents. Eliminate throw rugs, place hand bars in bath, shower, and toilet areas. Avoid wearing high heeled shoes or walking on wet, snowy, and icy areas. Limit or stop driving if you have vision or hearing problems, or if you feel you are unsteady with your movements and  reflexes. HEPATITIS C Hepatitis C is a type of viral infection affecting the liver. It is spread mainly through contact with blood from an infected person. It can be treated, but if left untreated, it can lead to severe liver damage over the years. Many people who are infected do not know that the virus is in their blood. If you are a "baby-boomer", it is recommended that you have one screening test for Hepatitis C. IMMUNIZATIONS  Several immunizations are important to consider having during your senior years, including:   Tetanus, diphtheria, and pertussis booster shot.  Influenza every year before the flu season begins.  Pneumonia vaccine.  Shingles vaccine.  Others, as indicated based on your specific needs. Talk to your caregiver about these. Document Released: 07/09/2005 Document Revised: 10/01/2013 Document Reviewed: 03/04/2008 ExitCare Patient Information 2015 [employer]. This information is not intended to replace advice given to you by your health care provider. Make sure you discuss any questions you have with your health care provider. Exercise to Stay Healthy Exercise helps you become and stay healthy. EXERCISE IDEAS AND TIPS Choose exercises that:  You enjoy.  Fit into your day. You do not need to exercise really hard to be healthy. You can do exercises at a slow or medium level and stay healthy. You can:  Stretch before and after working out.  Try yoga, Pilates, or tai chi.  Lift weights.  Walk fast, swim, jog, run, climb stairs, bicycle, dance, or rollerskate.  Take aerobic classes. Exercises that burn about 150 calories:  Running 1  miles in 15 minutes.  Playing volleyball for 45 to 60 minutes.  Washing and waxing a car for 45 to 60 minutes.  Playing touch football for 45 minutes.  Walking 1  miles in 35 minutes.  Pushing a stroller 1  miles in 30 minutes.  Playing basketball for 30 minutes.  Raking leaves for 30 minutes.  Bicycling 5  miles in 30 minutes.  Walking 2 miles in 30 minutes.  Dancing for 30 minutes.  Shoveling snow for 15 minutes.  Swimming laps for 20 minutes.  Walking up stairs for 15 minutes.  Bicycling 4 miles in 15 minutes.  Gardening for 30 to 45 minutes.  Jumping rope for 15 minutes.  Washing windows or floors for 45 to 60 minutes. Document Released: 06/19/2010 Document Revised: 08/09/2011 Document Reviewed: 06/19/2010 ExitCare Patient Information 2015 [employer]. This information is not intended to replace advice given to you by your health care provider. Make sure you discuss any questions you have with your health care provider.  

## 2014-09-13 NOTE — Progress Notes (Signed)
Brianna Santana [birth year] [id number]    History:    Presents for annual exam.  2002 TVH for fibroids/no HRT. History of hyperthyroidism with normal TSH at primary care. 2014 mammogram showed calcifications resolution on 2015 mammogram. Normal Pap history. 2014 T score -2.2 at spine FRAX 4.9%/0.4% declined treatment at that time. 2013 diagnosed with Crohn's on Humira with good symptom relief, gastroenterologist manages colonoscopies. Has had Pneumovax.  Past medical history, past surgical history, family history and social history were all reviewed and documented in the EPIC chart. 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 3 children all doing well, one daughter living in [place]. Father, sister, brother diabetes and hypertension. Mother died age 57 unknown cause.  ROS:  A ROS was performed and pertinent positives and negatives are included.  Exam:  Filed Vitals:   09/13/14 0942  BP: 124/80    General appearance:  Normal Thyroid:  Symmetrical, normal in size, without palpable masses or nodularity. Respiratory  Auscultation:  Clear without wheezing or rhonchi Cardiovascular  Auscultation:  Regular rate, without rubs, murmurs or gallops  Edema/varicosities:  Not grossly evident Abdominal  Soft,nontender, without masses, guarding or rebound.  Liver/spleen:  No organomegaly noted  Hernia:  None appreciated  Skin  Inspection:  Grossly normal   Breasts: Examined lying and sitting.     Right: Without masses, retractions, discharge or axillary adenopathy.     Left: Without masses, retractions, discharge or axillary adenopathy. Gentitourinary   Inguinal/mons:  Normal without inguinal adenopathy  External genitalia:  Normal  BUS/Urethra/Skene's glands:  Normal  Vagina:  Normal  Cervix:  Absent  Uterus:  Absent  Adnexa/parametria:     Rt: Without masses or tenderness.   Lt: Without masses or tenderness.  Anus and perineum: Normal  Digital rectal exam: Normal sphincter tone without palpated masses or  tenderness  Assessment/Plan:  59 y.o. MBF G3P3 for annual exam with no complaints.  TAH for fibroids no HRT Osteopeniai without elevated FRAX Crohn's onHumira good symptom relief per GI Primary care manages labs  Plan: Repeat DEXA, home safety, fall prevention and importance of weightbearing exercise reviewed. Vitamin D 2000 daily encouraged. SBE's, continue annual mammogram, calcium rich diet encouraged. Teresa Coombs [facility], 10:53 AM 09/13/2014

## 2014-09-14 LAB — URINALYSIS W MICROSCOPIC + REFLEX CULTURE
Bacteria, UA: NONE SEEN
Bilirubin Urine: NEGATIVE
Casts: NONE SEEN
Crystals: NONE SEEN
Glucose, UA: NEGATIVE mg/dL
Hgb urine dipstick: NEGATIVE
Ketones, ur: NEGATIVE mg/dL
Leukocytes, UA: NEGATIVE
Nitrite: NEGATIVE
Protein, ur: NEGATIVE mg/dL
Specific Gravity, Urine: 1.022 (ref 1.005–1.030)
Squamous Epithelial / LPF: NONE SEEN
Urobilinogen, UA: 0.2 mg/dL (ref 0.0–1.0)
pH: 5 (ref 5.0–8.0)

## 2014-09-16 ENCOUNTER — Other Ambulatory Visit: Payer: Self-pay | Admitting: Gynecology

## 2014-09-16 DIAGNOSIS — Z13820 Encounter for screening for osteoporosis: Secondary | ICD-10-CM

## 2014-09-16 DIAGNOSIS — M8580 Other specified disorders of bone density and structure, unspecified site: Secondary | ICD-10-CM

## 2014-10-01 ENCOUNTER — Ambulatory Visit (INDEPENDENT_AMBULATORY_CARE_PROVIDER_SITE_OTHER)

## 2014-10-01 ENCOUNTER — Encounter: Payer: Self-pay | Admitting: Gynecology

## 2014-10-01 DIAGNOSIS — Z13820 Encounter for screening for osteoporosis: Secondary | ICD-10-CM | POA: Diagnosis not present

## 2014-10-01 DIAGNOSIS — M8580 Other specified disorders of bone density and structure, unspecified site: Secondary | ICD-10-CM

## 2014-10-02 ENCOUNTER — Encounter: Payer: Self-pay | Admitting: Gastroenterology

## 2014-11-11 ENCOUNTER — Other Ambulatory Visit: Payer: Self-pay | Admitting: Orthopaedic Surgery

## 2014-11-11 DIAGNOSIS — M5136 Other intervertebral disc degeneration, lumbar region: Secondary | ICD-10-CM

## 2014-11-11 DIAGNOSIS — M5002 Cervical disc disorder with myelopathy, mid-cervical region, unspecified level: Secondary | ICD-10-CM

## 2014-11-18 ENCOUNTER — Telehealth: Payer: Self-pay | Admitting: Gastroenterology

## 2014-11-18 NOTE — Telephone Encounter (Signed)
Patient scheduled for f/u visit with Dr. Fuller Plan on 01/13/15 and PPD skin test on 11/28/14.

## 2014-11-21 ENCOUNTER — Ambulatory Visit (INDEPENDENT_AMBULATORY_CARE_PROVIDER_SITE_OTHER): Admitting: Internal Medicine

## 2014-11-21 VITALS — BP 134/82 | HR 84 | Temp 98.3°F | Resp 16 | Wt 176.0 lb

## 2014-11-21 DIAGNOSIS — L299 Pruritus, unspecified: Secondary | ICD-10-CM | POA: Diagnosis not present

## 2014-11-21 DIAGNOSIS — R58 Hemorrhage, not elsewhere classified: Secondary | ICD-10-CM | POA: Diagnosis not present

## 2014-11-21 MED ORDER — DOXYCYCLINE HYCLATE 100 MG PO TABS: 100.0000 mg | ORAL_TABLET | Freq: Two times a day (BID) | ORAL | Status: DC

## 2014-11-21 NOTE — Progress Notes (Signed)
   Subjective:    Patient ID: Brianna Santana, female    DOB: 31-May-1956, 59 y.o.   MRN: [id number]  HPI  She noted itching of the upper back in the shower 11/18/14. Her husband noticed some "bumps" in the area  As of 11/19/14 she had some rhinitis and cough for which she took Aleve and Robitussin.  The lesions on the back have remained unchanged . She's been applying hydrogen peroxide, alcohol, over-the-counter topical cortisone, and Neosporin. All have caused burning.  Her swim instructor thought it might be a spider bite.  Review of Systems She denies fever, chills, sweats. She has no headaches, nausea, vomiting, abdominal pain, or diarrhea.  There are no intraoral lesions. She's had no itchy, watery eyes, or sneezing. She's had no swelling of the lips or tongue.    Objective:   Physical Exam  Pertinent or positive findings include: There is an area 4 x 2 cm which appears to be irregular abrasion & ecchymosis over the upper back in the midline. There are 2 small papules with possible entry points inside this area.  General appearance :adequately nourished; in no distress.  Eyes: No conjunctival inflammation or scleral icterus is present.  Oral exam:  Lips and gums are healthy appearing.There is no oropharyngeal erythema or exudate noted. Dental hygiene is good.  Heart:  Normal rate and regular rhythm. S1 and S2 normal without gallop, murmur, click, rub or other extra sounds    Lungs:Chest clear to auscultation; no wheezes, rhonchi,rales ,or rubs present.No increased work of breathing.   Abdomen: bowel sounds normal, soft and non-tender without masses, organomegaly or hernias noted.  No guarding or rebound.   Vascular : all pulses equal ; no bruits present.  Skin:Warm & dry.  Intact without suspicious lesions or rashes ; no tenting or jaundice   Lymphatic: No lymphadenopathy is noted about the head, neck, axilla  Neuro: Strength, tone  normal.        Assessment & Plan:    #1. Dermatitis, pruritic. The lack of an ulcer and significant systemic symptoms rules against a spider bite. The ecchymosis is most likely posttraumatic from her scratching it. To cover any associated cellulitis doxycycline will be prescribed

## 2014-11-21 NOTE — Progress Notes (Signed)
Pre visit review using our clinic review tool, if applicable. No additional management support is needed unless otherwise documented below in the visit note. 

## 2014-11-21 NOTE — Patient Instructions (Signed)
Dip gauze in  sterile saline and applied to the wound twice a day. Cover the wound with Telfa , non stick dressing  without any antibiotic ointment. The saline can be purchased at the drugstore or you can make your own .Boil cup of salt in a gallon of water. Store mixture  in a clean container.Report Warning  signs as discussed (red streaks, pus, fever, increasing pain).

## 2014-11-22 ENCOUNTER — Encounter: Payer: Self-pay | Admitting: Internal Medicine

## 2014-11-22 ENCOUNTER — Ambulatory Visit
Admission: RE | Admit: 2014-11-22 | Discharge: 2014-11-22 | Disposition: A | Discharge status: Home / Self Care | Source: Ambulatory Visit | Attending: Orthopaedic Surgery | Admitting: Orthopaedic Surgery

## 2014-11-22 DIAGNOSIS — M5002 Cervical disc disorder with myelopathy, mid-cervical region, unspecified level: Secondary | ICD-10-CM

## 2014-11-22 DIAGNOSIS — M5136 Other intervertebral disc degeneration, lumbar region: Secondary | ICD-10-CM

## 2014-11-28 ENCOUNTER — Encounter: Admitting: Gastroenterology

## 2014-12-03 ENCOUNTER — Ambulatory Visit (INDEPENDENT_AMBULATORY_CARE_PROVIDER_SITE_OTHER): Admitting: Gastroenterology

## 2014-12-03 DIAGNOSIS — K50119 Crohn's disease of large intestine with unspecified complications: Secondary | ICD-10-CM | POA: Diagnosis not present

## 2014-12-05 LAB — TB SKIN TEST
Induration: 0 mm
TB Skin Test: NEGATIVE

## 2014-12-12 ENCOUNTER — Telehealth: Payer: Self-pay | Admitting: Internal Medicine

## 2014-12-12 NOTE — Telephone Encounter (Signed)
Do you want patient to come back in for office visit---please advise, i will call her, thanks

## 2014-12-12 NOTE — Telephone Encounter (Signed)
Brianna Santana from Dr. Towanda Malkin office would like you to call her. She is faxing a report on her MRI Her [phone]

## 2014-12-12 NOTE — Telephone Encounter (Signed)
Patient has called in regard requesting a call back as well.  States MRI showed 3.6 mass in right thyroid and would like to know if she needs to come in.  Patient can be reached at [phone].

## 2014-12-12 NOTE — Telephone Encounter (Signed)
Yes would recommend a visit and likely next steps would be lab work for thyroid and thyroid ultrasound.

## 2014-12-13 NOTE — Telephone Encounter (Signed)
Left message advising patient to call back in to schedule office visit with dr Doug Sou

## 2014-12-16 ENCOUNTER — Ambulatory Visit (INDEPENDENT_AMBULATORY_CARE_PROVIDER_SITE_OTHER): Admitting: Family

## 2014-12-16 ENCOUNTER — Encounter: Payer: Self-pay | Admitting: Family

## 2014-12-16 ENCOUNTER — Other Ambulatory Visit (INDEPENDENT_AMBULATORY_CARE_PROVIDER_SITE_OTHER)

## 2014-12-16 VITALS — BP 140/80 | HR 81 | Temp 98.0°F | Resp 18 | Ht 63.0 in | Wt 178.8 lb

## 2014-12-16 DIAGNOSIS — R221 Localized swelling, mass and lump, neck: Secondary | ICD-10-CM | POA: Diagnosis not present

## 2014-12-16 LAB — CBC WITH DIFFERENTIAL/PLATELET
Basophils Absolute: 0 10*3/uL (ref 0.0–0.1)
Basophils Relative: 1 % (ref 0.0–3.0)
Eosinophils Absolute: 0.2 10*3/uL (ref 0.0–0.7)
Eosinophils Relative: 5.7 % — ABNORMAL HIGH (ref 0.0–5.0)
HCT: 36.9 % (ref 36.0–46.0)
Hemoglobin: 12.7 g/dL (ref 12.0–15.0)
Lymphocytes Relative: 50.7 % — ABNORMAL HIGH (ref 12.0–46.0)
Lymphs Abs: 1.5 10*3/uL (ref 0.7–4.0)
MCHC: 34.5 g/dL (ref 30.0–36.0)
MCV: 97.2 fl (ref 78.0–100.0)
Monocytes Absolute: 0.2 10*3/uL (ref 0.1–1.0)
Monocytes Relative: 7.7 % (ref 3.0–12.0)
Neutro Abs: 1.1 10*3/uL — ABNORMAL LOW (ref 1.4–7.7)
Neutrophils Relative %: 34.9 % — ABNORMAL LOW (ref 43.0–77.0)
Platelets: 274 10*3/uL (ref 150.0–400.0)
RBC: 3.8 Mil/uL — ABNORMAL LOW (ref 3.87–5.11)
RDW: 16.2 % — ABNORMAL HIGH (ref 11.5–15.5)
WBC: 3 10*3/uL — ABNORMAL LOW (ref 4.0–10.5)

## 2014-12-16 LAB — T4: T4, Total: 7.1 ug/dL (ref 4.5–12.0)

## 2014-12-16 LAB — T3: T3, Total: 105.2 ng/dL (ref 80.0–204.0)

## 2014-12-16 LAB — TSH: TSH: 2.81 u[IU]/mL (ref 0.35–4.50)

## 2014-12-16 NOTE — Assessment & Plan Note (Signed)
Symptoms and exam of a mass located in the right lower quadrant of her neck consistent with potential thyroid as confirmed by MRI. Obtain US of neck and soft tissue. Obtain CBC with Diff, TSH, T3, and T4. Follow up pending ultrasound results.

## 2014-12-16 NOTE — Progress Notes (Signed)
Pre visit review using our clinic review tool, if applicable. No additional management support is needed unless otherwise documented below in the visit note. 

## 2014-12-16 NOTE — Patient Instructions (Signed)
Thank you for choosing [facility].  Summary/Instructions:  If your symptoms worsen or fail to improve, please contact our office for further instruction, or in case of emergency go directly to the emergency room at the closest medical facility.

## 2014-12-16 NOTE — Progress Notes (Signed)
Subjective:    Patient ID: Brianna Santana, female    DOB: 1956-04-25, 59 y.o.   MRN: [id number]  Chief Complaint  Patient presents with  . Mass    3.72m mass on the right side of her thyroid, was at [facility] and they sent her to MRI and they found a mass    HPI:  Brianna KARPOWICZis a 59y.o. female with a PMH of asthma, Crohn's disease, hyperthyroidism, and osteopenia who presents today for office follow up.    1.) Mass on thyroid - recently seen [facility] and during an MRI she was noted to have a 3.6 cm mass located on her right thyroid. Radiology recommends a follow up thyroid ultrasound. Describes some swelling of her neck in the morning that goes down over the course of the day. Denies any pain or tenderness.  Allergies  Allergen Reactions  . Amoxicillin Itching  . Levofloxacin Other (See Comments)    Doesn't remember reaction  . Tramadol     Major Headaches    Current Outpatient Prescriptions on File Prior to Visit  Medication Sig Dispense Refill  . acetaminophen-codeine (TYLENOL #3) 300-30 MG per tablet Take 1 tablet by mouth every 6 (six) hours as needed for moderate pain. 30 tablet 0  . Adalimumab (HUMIRA PEN) 40 MG/0.8ML PNKT Inject 40 mg into the skin every 14 (fourteen) days. 6 each 1  . albuterol (PROAIR HFA) 108 (90 BASE) MCG/ACT inhaler Inhale 2 puffs into the lungs every 4 (four) hours as needed. For shortness of breath or wheezing    . betamethasone valerate ointment (VALISONE) 0.1 % Apply topically 2 (two) times daily. 30 g 0  . budesonide-formoterol (SYMBICORT) 80-4.5 MCG/ACT inhaler Inhale 2 puffs into the lungs 2 (two) times daily.    . Calcium Carbonate-Vitamin D (CALCIUM 600+D) 600-400 MG-UNIT per tablet Take 1 tablet by mouth 2 (two) times daily.      . cetirizine (ZYRTEC) 10 MG tablet Take 10 mg by mouth daily.    . Cholecalciferol (VITAMIN D) 2000 UNITS tablet Take 2,000 Units by mouth daily.    . clobetasol cream  (TEMOVATE) 02.35% Apply 1 application topically as needed. For rash    . doxycycline (VIBRA-TABS) 100 MG tablet Take 1 tablet (100 mg total) by mouth 2 (two) times daily. 14 tablet 0  . gabapentin (NEURONTIN) 300 MG capsule Take 300 mg by mouth 2 (two) times daily.     . mercaptopurine (PURINETHOL) 50 MG tablet Take 1 1/2 tablet by mouth daily 135 tablet 1  . montelukast (SINGULAIR) 10 MG tablet Take 10 mg by mouth at bedtime.     . Multiple Vitamin (MULTIVITAMIN WITH MINERALS) TABS Take 1 tablet by mouth daily.    .Marland Kitchenomeprazole (PRILOSEC) 40 MG capsule TAKE 1 CAPSULE BY MOUTH DAILY FOR HEARTBURN 90 capsule 1  . vitamin B-12 (CYANOCOBALAMIN) 1000 MCG tablet Take 1,000 mcg by mouth daily.     No current facility-administered medications on file prior to visit.    Review of Systems  Constitutional: Negative for fever, chills and fatigue.  Endocrine: Negative for cold intolerance and heat intolerance.  Neurological: Negative for headaches.      Objective:    BP 140/80 mmHg  Pulse 81  Temp(Src) 98 F (36.7 C) (Oral)  Resp 18  Ht 5' 3"  (1.6 m)  Wt 178 lb 12.8 oz (81.103 kg)  BMI 31.68 kg/m2  SpO2 96% Nursing note and vital  signs reviewed.  Physical Exam  Constitutional: She is oriented to person, place, and time. She appears well-developed and well-nourished. No distress.  Neck:  Palpable mass located near the right lower quadrant of thyroid that is firm to touch and non-tender.   Cardiovascular: Normal rate, regular rhythm, normal heart sounds and intact distal pulses.   Pulmonary/Chest: Effort normal and breath sounds normal.  Neurological: She is alert and oriented to person, place, and time.  Skin: Skin is warm and dry.  Psychiatric: She has a normal mood and affect. Her behavior is normal. Judgment and thought content normal.       Assessment & Plan:   Problem List Items Addressed This Visit      Other   Mass in neck - Primary    Symptoms and exam of a mass located in  the right lower quadrant of her neck consistent with potential thyroid as confirmed by MRI. Obtain US of neck and soft tissue. Obtain CBC with Diff, TSH, T3, and T4. Follow up pending ultrasound results.       Relevant Orders   CBC w/Diff   TSH   T4   T3   US Soft Tissue Head/Neck

## 2014-12-18 ENCOUNTER — Telehealth: Payer: Self-pay | Admitting: Family

## 2014-12-18 NOTE — Telephone Encounter (Signed)
Please inform patient that her blood work shows that there is no evidence of infection or thyroid dysfunction.

## 2014-12-18 NOTE — Telephone Encounter (Signed)
Patient is returning your call.  

## 2014-12-18 NOTE — Telephone Encounter (Signed)
LVM for pt to call back.

## 2014-12-18 NOTE — Telephone Encounter (Signed)
Pt aware of results 

## 2014-12-20 ENCOUNTER — Ambulatory Visit
Admission: RE | Admit: 2014-12-20 | Discharge: 2014-12-20 | Disposition: A | Discharge status: Home / Self Care | Source: Ambulatory Visit | Attending: Family | Admitting: Family

## 2014-12-20 DIAGNOSIS — R221 Localized swelling, mass and lump, neck: Secondary | ICD-10-CM

## 2014-12-23 ENCOUNTER — Telehealth: Payer: Self-pay | Admitting: Family

## 2014-12-23 DIAGNOSIS — R221 Localized swelling, mass and lump, neck: Secondary | ICD-10-CM

## 2014-12-23 NOTE — Telephone Encounter (Signed)
Left HIPPA appropriate voicemail to return call.

## 2014-12-23 NOTE — Telephone Encounter (Signed)
Patient returned phone call again.  Patient is leaving house for the evening but states you can reach on [phone].

## 2014-12-23 NOTE — Telephone Encounter (Signed)
Returned patient's call and left message to call back.

## 2014-12-23 NOTE — Telephone Encounter (Signed)
Patient is calling back.  She can be reached at [phone]

## 2014-12-24 ENCOUNTER — Telehealth: Payer: Self-pay | Admitting: Family

## 2014-12-24 NOTE — Telephone Encounter (Signed)
Spoke with patient regarding results and referral to endocrinology. All questions were answered. Follow up pending endocrinology visit.

## 2014-12-24 NOTE — Telephone Encounter (Signed)
Patient has called back. She has aerobics this morning and will be back home after 10 this morning.  She can be reached at both numbers [phone] and [phone]

## 2015-01-01 ENCOUNTER — Telehealth: Payer: Self-pay | Admitting: Gastroenterology

## 2015-01-01 MED ORDER — MERCAPTOPURINE 50 MG PO TABS: ORAL_TABLET | ORAL | Status: DC

## 2015-01-01 MED ORDER — ADALIMUMAB 40 MG/0.8ML ~~LOC~~ AJKT: 40.0000 mg | AUTO-INJECTOR | SUBCUTANEOUS | Status: DC

## 2015-01-01 NOTE — Telephone Encounter (Signed)
Humira prescription sent to [facility].  Patient notified and patient states she also needs mercaptopurine sent as well. Sent mercaptopurine to [facility] also.

## 2015-01-13 ENCOUNTER — Ambulatory Visit (INDEPENDENT_AMBULATORY_CARE_PROVIDER_SITE_OTHER): Admitting: Gastroenterology

## 2015-01-13 ENCOUNTER — Other Ambulatory Visit (INDEPENDENT_AMBULATORY_CARE_PROVIDER_SITE_OTHER)

## 2015-01-13 ENCOUNTER — Encounter: Payer: Self-pay | Admitting: Gastroenterology

## 2015-01-13 VITALS — BP 112/70 | HR 76 | Ht 64.0 in | Wt 175.1 lb

## 2015-01-13 DIAGNOSIS — K219 Gastro-esophageal reflux disease without esophagitis: Secondary | ICD-10-CM | POA: Diagnosis not present

## 2015-01-13 DIAGNOSIS — K5010 Crohn's disease of large intestine without complications: Secondary | ICD-10-CM | POA: Diagnosis not present

## 2015-01-13 LAB — COMPREHENSIVE METABOLIC PANEL
ALT: 49 U/L — ABNORMAL HIGH (ref 0–35)
AST: 24 U/L (ref 0–37)
Albumin: 4.4 g/dL (ref 3.5–5.2)
Alkaline Phosphatase: 88 U/L (ref 39–117)
BUN: 20 mg/dL (ref 6–23)
CO2: 30 mEq/L (ref 19–32)
Calcium: 9.7 mg/dL (ref 8.4–10.5)
Chloride: 105 mEq/L (ref 96–112)
Creatinine, Ser: 0.69 mg/dL (ref 0.40–1.20)
GFR: 111.86 mL/min (ref >60.00)
Glucose, Bld: 93 mg/dL (ref 70–99)
Potassium: 4.4 mEq/L (ref 3.5–5.1)
Sodium: 141 mEq/L (ref 135–145)
Total Bilirubin: 1.1 mg/dL (ref 0.2–1.2)
Total Protein: 7.5 g/dL (ref 6.0–8.3)

## 2015-01-13 LAB — CBC WITH DIFFERENTIAL/PLATELET
Basophils Absolute: 0.1 10*3/uL (ref 0.0–0.1)
Basophils Relative: 1.9 % (ref 0.0–3.0)
Eosinophils Absolute: 0.2 10*3/uL (ref 0.0–0.7)
Eosinophils Relative: 5.7 % — ABNORMAL HIGH (ref 0.0–5.0)
HCT: 39.3 % (ref 36.0–46.0)
Hemoglobin: 13.2 g/dL (ref 12.0–15.0)
Lymphocytes Relative: 42.5 % (ref 12.0–46.0)
Lymphs Abs: 1.2 10*3/uL (ref 0.7–4.0)
MCHC: 33.6 g/dL (ref 30.0–36.0)
MCV: 97.1 fl (ref 78.0–100.0)
Monocytes Absolute: 0.3 10*3/uL (ref 0.1–1.0)
Monocytes Relative: 11.5 % (ref 3.0–12.0)
Neutro Abs: 1.1 10*3/uL — ABNORMAL LOW (ref 1.4–7.7)
Neutrophils Relative %: 38.4 % — ABNORMAL LOW (ref 43.0–77.0)
Platelets: 253 10*3/uL (ref 150.0–400.0)
RBC: 4.05 Mil/uL (ref 3.87–5.11)
RDW: 15.3 % (ref 11.5–15.5)
WBC: 2.7 10*3/uL — ABNORMAL LOW (ref 4.0–10.5)

## 2015-01-13 LAB — LIPASE: Lipase: 20 U/L (ref 11.0–59.0)

## 2015-01-13 NOTE — Patient Instructions (Signed)
Your physician has requested that you go to the basement for the following lab work before leaving today:CBC, Cmet and Lipase.  Thank you for choosing me and [facility].  Pricilla Riffle. Dagoberto Ligas., MD., Marval Regal

## 2015-01-13 NOTE — Progress Notes (Signed)
    History of Present Illness: This is a 59 year old female returning for follow-up of Crohn's colitis. Symptoms remain under very good control on 6-MP and Humira.  Current Medications, Allergies, Past Medical History, Past Surgical History, Family History and Social History were reviewed in [employer] record.  Physical Exam: General: Well developed , well nourished, no acute distress Head: Normocephalic and atraumatic Eyes:  sclerae anicteric, EOMI Ears: Normal auditory acuity Mouth: No deformity or lesions Lungs: Clear throughout to auscultation Heart: Regular rate and rhythm; no murmurs, rubs or bruits Abdomen: Soft, non tender and non distended. No masses, hepatosplenomegaly or hernias noted. Normal Bowel sounds Musculoskeletal: Symmetrical with no gross deformities  Pulses:  Normal pulses noted Extremities: No clubbing, cyanosis, edema or deformities noted Neurological: Alert oriented x 4, grossly nonfocal Psychological:  Alert and cooperative. Normal mood and affect  Assessment and Recommendations:  1. Crohns colitis. 6 MP 75 mg daily restarted in 02/2014. CBC, CMET, lipase today. Continue Humira and 6 MP at current dosages. Follow up in 6 months.  2. GERD and epigastric pain, symptoms under control. Antireflux regimen reviewed. Continue omeprazole 40 mg qam.   3. History of mildly elevated ALT. LFTs in 07/2014 were normal. Repeat blood work today.

## 2015-01-14 ENCOUNTER — Other Ambulatory Visit: Payer: Self-pay

## 2015-01-14 DIAGNOSIS — K5090 Crohn's disease, unspecified, without complications: Secondary | ICD-10-CM

## 2015-01-16 ENCOUNTER — Encounter: Payer: Self-pay | Admitting: Endocrinology

## 2015-01-16 ENCOUNTER — Ambulatory Visit (INDEPENDENT_AMBULATORY_CARE_PROVIDER_SITE_OTHER): Admitting: Endocrinology

## 2015-01-16 ENCOUNTER — Other Ambulatory Visit (HOSPITAL_COMMUNITY)
Admission: RE | Admit: 2015-01-16 | Discharge: 2015-01-16 | Disposition: A | Discharge status: Home / Self Care | Source: Ambulatory Visit | Attending: Endocrinology | Admitting: Endocrinology

## 2015-01-16 VITALS — BP 122/78 | HR 75 | Temp 98.1°F | Ht 64.0 in | Wt 176.0 lb

## 2015-01-16 DIAGNOSIS — E079 Disorder of thyroid, unspecified: Secondary | ICD-10-CM | POA: Diagnosis not present

## 2015-01-16 DIAGNOSIS — R221 Localized swelling, mass and lump, neck: Secondary | ICD-10-CM

## 2015-01-16 DIAGNOSIS — E041 Nontoxic single thyroid nodule: Secondary | ICD-10-CM | POA: Insufficient documentation

## 2015-01-16 NOTE — Progress Notes (Signed)
Subjective:    Patient ID: Brianna Santana, female    DOB: 11/17/1955, 59 y.o.   MRN: [id number]  HPI In 2000, i saw this patient, and I rx'ed I-131 for hyperthyroidism.  Prior to the rx, she had elevated iodine uptake, but she had neither [name] nor nuc med imaging.  She now reports moderate swelling at the right anterior neck, but no assoc pain.   Past Medical History  Diagnosis Date  . Crohn's colitis   . Anemia   . C. difficile colitis 2011 and 07/2010  . Hyperplastic polyp of intestine 09/2007  . Cervical dysplasia   . Osteopenia 09/2014    T score -2.2 FRAX 7.3%/0.8%  . Allergy   . Arthritis   . Hyperthyroidism     S/P I-131 RX    Past Surgical History  Procedure Laterality Date  . Tonsillectomy    . Colonoscopy    . Breast lumpectomy  1980's, 2000    Right Breast x 2  . Total abdominal hysterectomy  2000  . Colposcopy    . Flexible sigmoidoscopy  03/29/2012    Procedure: FLEXIBLE SIGMOIDOSCOPY;  Surgeon: Ladene Artist, MD,FACG;  Location: [facility] ENDOSCOPY;  Service: Endoscopy;  Laterality: N/A;    Social History   Social History  . Marital Status: Married    Spouse Name: N/A  . Number of Children: 3  . Years of Education: N/A   Occupational History  . Retired    Social History Main Topics  . Smoking status: Former Smoker -- 10 years    Types: Cigarettes    Quit date: 09/07/1988  . Smokeless tobacco: Never Used  . Alcohol Use: No  . Drug Use: No  . Sexual Activity: Yes    Birth Control/ Protection: Surgical     Comment: INTERCOURSE AGE 68, SEXUAL PARTNERS LESS THAN 5   Other Topics Concern  . Not on file   Social History Narrative    Current Outpatient Prescriptions on File Prior to Visit  Medication Sig Dispense Refill  . acetaminophen-codeine (TYLENOL #3) 300-30 MG per tablet Take 1 tablet by mouth every 6 (six) hours as needed for moderate pain. 30 tablet 0  . Adalimumab (HUMIRA PEN) 40 MG/0.8ML PNKT Inject 40 mg into the skin every 14 (fourteen)  days. 6 each 1  . albuterol (PROAIR HFA) 108 (90 BASE) MCG/ACT inhaler Inhale 2 puffs into the lungs every 4 (four) hours as needed. For shortness of breath or wheezing    . betamethasone valerate ointment (VALISONE) 0.1 % Apply topically 2 (two) times daily. 30 g 0  . budesonide-formoterol (SYMBICORT) 80-4.5 MCG/ACT inhaler Inhale 2 puffs into the lungs 2 (two) times daily.    . Calcium Carbonate-Vitamin D (CALCIUM 600+D) 600-400 MG-UNIT per tablet Take 1 tablet by mouth 2 (two) times daily.      . cetirizine (ZYRTEC) 10 MG tablet Take 10 mg by mouth daily.    . Cholecalciferol (VITAMIN D) 2000 UNITS tablet Take 2,000 Units by mouth daily.    . clobetasol cream (TEMOVATE) 9.62 % Apply 1 application topically as needed. For rash    . gabapentin (NEURONTIN) 300 MG capsule Take 300 mg by mouth 2 (two) times daily.     . meloxicam (MOBIC) 15 MG tablet Take 15 mg by mouth daily.    . mercaptopurine (PURINETHOL) 50 MG tablet Take 1 1/2 tablet by mouth daily 135 tablet 1  . montelukast (SINGULAIR) 10 MG tablet Take 10 mg by mouth at bedtime.     Marland Kitchen  Multiple Vitamin (MULTIVITAMIN WITH MINERALS) TABS Take 1 tablet by mouth daily.    Marland Kitchen omeprazole (PRILOSEC) 40 MG capsule TAKE 1 CAPSULE BY MOUTH DAILY FOR HEARTBURN 90 capsule 1  . vitamin B-12 (CYANOCOBALAMIN) 1000 MCG tablet Take 1,000 mcg by mouth daily.     No current facility-administered medications on file prior to visit.    Allergies  Allergen Reactions  . Amoxicillin Itching  . Levofloxacin Other (See Comments)    Doesn't remember reaction  . Tramadol     Major Headaches    Family History  Problem Relation Age of Onset  . Diabetes Father   . Diabetes Sister     x 2  . Hypertension Sister     x 2  . Diabetes Brother   . Hypertension Brother   . Colon cancer Neg Hx   . Esophageal cancer Neg Hx   . Pancreatic cancer Neg Hx   . Rectal cancer Neg Hx   . Stomach cancer Neg Hx     BP 122/78 mmHg  Pulse 75  Temp(Src) 98.1 F (36.7  C) (Oral)  Ht 5' 4"  (1.626 m)  Wt 176 lb (79.833 kg)  BMI 30.20 kg/m2  SpO2 97%  Review of Systems Denies dysphagia, hoarseness, visual loss, hearing loss, and sob.  She has gained weight.  She has cold intolerance itching, palpitations, and easy bruising.      Objective:   Physical Exam VS: see vs page GEN: no distress HEAD: head: no deformity eyes: no periorbital swelling, no proptosis external nose and ears are normal mouth: no lesion seen NECK: the right thyroid mass is easily palpable.  It is freely mobile.   CHEST WALL: no deformity LUNGS:  Clear to auscultation.  CV: reg rate and rhythm, no murmur MUSCULOSKELETAL: muscle bulk and strength are grossly normal.  no obvious joint swelling.  gait is normal and steady EXTEMITIES: no deformity.  no ulcer on the feet.  feet are of normal color and temp.  no edema PULSES: dorsalis pedis intact bilat.  no carotid bruit NEURO:  cn 2-12 grossly intact.   readily moves all 4's.  sensation is intact to touch on the feet SKIN:  Normal texture and temperature.  No rash or suspicious lesion is visible.   NODES:  None palpable at the neck PSYCH: alert, well-oriented.  Does not appear anxious nor depressed.    Radiol: 2003: chest CT: no mention is made of any goiter  thyroid needle bx: consent obtained, signed form on chart The area is first sprayed with cooling agent local: xylocaine 2%, with epinephrine prep: alcohol pad 4 bxs are done with 25 and 09F needles no complications  Lab Results  Component Value Date   TSH 2.81 12/16/2014   T3TOTAL 105.2 12/16/2014   T4TOTAL 7.1 12/16/2014      Assessment & Plan:  Hyperthyroidism, resolved with I-131 rx Right thyroid mass, chronic. bx is done today.  Patient is advised the following: Patient Instructions  We'll let you know about the biopsy results. Please return in 1 year.

## 2015-01-16 NOTE — Patient Instructions (Signed)
We'll let you know about the biopsy results. Please return in 1 year.

## 2015-03-04 ENCOUNTER — Other Ambulatory Visit: Payer: Self-pay

## 2015-03-04 DIAGNOSIS — Z1231 Encounter for screening mammogram for malignant neoplasm of breast: Secondary | ICD-10-CM

## 2015-03-12 ENCOUNTER — Ambulatory Visit
Admission: RE | Admit: 2015-03-12 | Discharge: 2015-03-12 | Disposition: A | Discharge status: Home / Self Care | Source: Ambulatory Visit

## 2015-03-12 DIAGNOSIS — Z1231 Encounter for screening mammogram for malignant neoplasm of breast: Secondary | ICD-10-CM

## 2015-04-01 ENCOUNTER — Telehealth: Payer: Self-pay | Admitting: Gastroenterology

## 2015-04-01 MED ORDER — OMEPRAZOLE 40 MG PO CPDR: DELAYED_RELEASE_CAPSULE | ORAL | Status: DC

## 2015-04-01 NOTE — Telephone Encounter (Signed)
Informed patient that I sent her omeprazole to her mail order pharmacy. Pt verbalized understanding.

## 2015-04-14 ENCOUNTER — Other Ambulatory Visit: Payer: Self-pay | Admitting: Neurology

## 2015-04-14 MED ORDER — ALBUTEROL SULFATE HFA 108 (90 BASE) MCG/ACT IN AERS: 2.0000 | INHALATION_SPRAY | RESPIRATORY_TRACT | Status: DC | PRN

## 2015-04-21 ENCOUNTER — Other Ambulatory Visit (INDEPENDENT_AMBULATORY_CARE_PROVIDER_SITE_OTHER): Payer: Self-pay

## 2015-04-21 DIAGNOSIS — K5090 Crohn's disease, unspecified, without complications: Secondary | ICD-10-CM

## 2015-04-21 LAB — COMPREHENSIVE METABOLIC PANEL
ALT: 20 U/L (ref 0–35)
AST: 16 U/L (ref 0–37)
Albumin: 4.3 g/dL (ref 3.5–5.2)
Alkaline Phosphatase: 92 U/L (ref 39–117)
BUN: 17 mg/dL (ref 6–23)
CO2: 25 mEq/L (ref 19–32)
Calcium: 9.5 mg/dL (ref 8.4–10.5)
Chloride: 107 mEq/L (ref 96–112)
Creatinine, Ser: 0.73 mg/dL (ref 0.40–1.20)
GFR: 104.73 mL/min (ref >60.00)
Glucose, Bld: 99 mg/dL (ref 70–99)
Potassium: 4 mEq/L (ref 3.5–5.1)
Sodium: 141 mEq/L (ref 135–145)
Total Bilirubin: 1 mg/dL (ref 0.2–1.2)
Total Protein: 7.1 g/dL (ref 6.0–8.3)

## 2015-04-22 LAB — CBC WITH DIFFERENTIAL/PLATELET
Basophils Absolute: 0 10*3/uL (ref 0.0–0.1)
Basophils Relative: 1.4 % (ref 0.0–3.0)
Eosinophils Absolute: 0.3 10*3/uL (ref 0.0–0.7)
Eosinophils Relative: 10.3 % — ABNORMAL HIGH (ref 0.0–5.0)
HCT: 38.1 % (ref 36.0–46.0)
Hemoglobin: 12.8 g/dL (ref 12.0–15.0)
Lymphocytes Relative: 48.8 % — ABNORMAL HIGH (ref 12.0–46.0)
Lymphs Abs: 1.3 10*3/uL (ref 0.7–4.0)
MCHC: 33.6 g/dL (ref 30.0–36.0)
MCV: 96.8 fl (ref 78.0–100.0)
Monocytes Absolute: 0.3 10*3/uL (ref 0.1–1.0)
Monocytes Relative: 10.4 % (ref 3.0–12.0)
Neutro Abs: 0.8 10*3/uL — ABNORMAL LOW (ref 1.4–7.7)
Neutrophils Relative %: 29.1 % — ABNORMAL LOW (ref 43.0–77.0)
Platelets: 268 10*3/uL (ref 150.0–400.0)
RBC: 3.94 Mil/uL (ref 3.87–5.11)
RDW: 14.8 % (ref 11.5–15.5)
WBC: 2.7 10*3/uL — ABNORMAL LOW (ref 4.0–10.5)

## 2015-05-02 ENCOUNTER — Encounter: Payer: Self-pay | Admitting: Internal Medicine

## 2015-05-02 ENCOUNTER — Ambulatory Visit (INDEPENDENT_AMBULATORY_CARE_PROVIDER_SITE_OTHER): Admitting: Internal Medicine

## 2015-05-02 VITALS — BP 128/88 | HR 87 | Temp 98.7°F | Resp 16 | Ht 64.0 in | Wt 178.0 lb

## 2015-05-02 DIAGNOSIS — Z23 Encounter for immunization: Secondary | ICD-10-CM

## 2015-05-02 DIAGNOSIS — Z0000 Encounter for general adult medical examination without abnormal findings: Secondary | ICD-10-CM | POA: Insufficient documentation

## 2015-05-02 NOTE — Progress Notes (Signed)
Pre visit review using our clinic review tool, if applicable. No additional management support is needed unless otherwise documented below in the visit note. 

## 2015-05-02 NOTE — Progress Notes (Signed)
   Subjective:    Patient ID: Brianna Santana, female    DOB: July 22, 1955, 59 y.o.   MRN: [id number]  HPI The patient is a 58 YO female coming in for wellness. No new concerns. Doing water aerobics and walking. Non-smoker.   Review of Systems  Constitutional: Negative for fever, chills, activity change, appetite change and fatigue.  HENT: Negative for congestion, ear discharge, ear pain, facial swelling, sinus pressure and sore throat.   Eyes: Negative.   Respiratory: Negative for cough, chest tightness, shortness of breath and wheezing.   Cardiovascular: Negative for chest pain, palpitations and leg swelling.  Gastrointestinal: Positive for rectal pain. Negative for nausea, abdominal pain, diarrhea, constipation and abdominal distention.  Musculoskeletal: Negative.   Skin: Negative.   Neurological: Negative.   Psychiatric/Behavioral: Negative.       Objective:   Physical Exam  Constitutional: She is oriented to person, place, and time. She appears well-developed and well-nourished.  HENT:  Head: Normocephalic and atraumatic.  Eyes: EOM are normal.  Neck: Normal range of motion.  Cardiovascular: Normal rate and regular rhythm.   Carotids clear  Pulmonary/Chest: Effort normal and breath sounds normal. No respiratory distress. She has no wheezes. She has no rales.  Abdominal: Soft. Bowel sounds are normal. She exhibits no distension. There is no tenderness. There is no rebound.  Musculoskeletal: She exhibits no edema.  Neurological: She is alert and oriented to person, place, and time. Coordination normal.  Skin: Skin is warm and dry.  Psychiatric: She has a normal mood and affect.   Filed Vitals:   05/02/15 1419  BP: 128/88  Pulse: 87  Temp: 98.7 F (37.1 C)  TempSrc: Oral  Resp: 16  Height: 5' 4"  (1.626 m)  Weight: 178 lb (80.74 kg)  SpO2: 98%      Assessment & Plan:

## 2015-05-02 NOTE — Patient Instructions (Signed)
Keep up the good work with your health! You are doing well.   Keep doing the water aerobics.   Health Maintenance, Female Adopting a healthy lifestyle and getting preventive care can go a long way to promote health and wellness. Talk with your health care provider about what schedule of regular examinations is right for you. This is a good chance for you to check in with your provider about disease prevention and staying healthy. In between checkups, there are plenty of things you can do on your own. Experts have done a lot of research about which lifestyle changes and preventive measures are most likely to keep you healthy. Ask your health care provider for more information. WEIGHT AND DIET  Eat a healthy diet  Be sure to include plenty of vegetables, fruits, low-fat dairy products, and lean protein.  Do not eat a lot of foods high in solid fats, added sugars, or salt.  Get regular exercise. This is one of the most important things you can do for your health.  Most adults should exercise for at least 150 minutes each week. The exercise should increase your heart rate and make you sweat (moderate-intensity exercise).  Most adults should also do strengthening exercises at least twice a week. This is in addition to the moderate-intensity exercise.  Maintain a healthy weight  Body mass index (BMI) is a measurement that can be used to identify possible weight problems. It estimates body fat based on height and weight. Your health care provider can help determine your BMI and help you achieve or maintain a healthy weight.  For females 24 years of age and older:   A BMI below 18.5 is considered underweight.  A BMI of 18.5 to 24.9 is normal.  A BMI of 25 to 29.9 is considered overweight.  A BMI of 30 and above is considered obese.  Watch levels of cholesterol and blood lipids  You should start having your blood tested for lipids and cholesterol at 59 years of age, then have this test  every 5 years.  You may need to have your cholesterol levels checked more often if:  Your lipid or cholesterol levels are high.  You are older than 59 years of age.  You are at high risk for heart disease.  CANCER SCREENING   Lung Cancer  Lung cancer screening is recommended for adults 64-58 years old who are at high risk for lung cancer because of a history of smoking.  A yearly low-dose CT scan of the lungs is recommended for people who:  Currently smoke.  Have quit within the past 15 years.  Have at least a 30-pack-year history of smoking. A pack year is smoking an average of one pack of cigarettes a day for 1 year.  Yearly screening should continue until it has been 15 years since you quit.  Yearly screening should stop if you develop a health problem that would prevent you from having lung cancer treatment.  Breast Cancer  Practice breast self-awareness. This means understanding how your breasts normally appear and feel.  It also means doing regular breast self-exams. Let your health care provider know about any changes, no matter how small.  If you are in your 20s or 30s, you should have a clinical breast exam (CBE) by a health care provider every 1-3 years as part of a regular health exam.  If you are 21 or older, have a CBE every year. Also consider having a breast X-ray (mammogram) every year.  If you have a family history of breast cancer, talk to your health care provider about genetic screening.  If you are at high risk for breast cancer, talk to your health care provider about having an MRI and a mammogram every year.  Breast cancer gene (BRCA) assessment is recommended for women who have family members with BRCA-related cancers. BRCA-related cancers include:  Breast.  Ovarian.  Tubal.  Peritoneal cancers.  Results of the assessment will determine the need for genetic counseling and BRCA1 and BRCA2 testing. Cervical Cancer Your health care provider  may recommend that you be screened regularly for cancer of the pelvic organs (ovaries, uterus, and vagina). This screening involves a pelvic examination, including checking for microscopic changes to the surface of your cervix (Pap test). You may be encouraged to have this screening done every 3 years, beginning at age 43.  For women ages 59-65, health care providers may recommend pelvic exams and Pap testing every 3 years, or they may recommend the Pap and pelvic exam, combined with testing for human papilloma virus (HPV), every 5 years. Some types of HPV increase your risk of cervical cancer. Testing for HPV may also be done on women of any age with unclear Pap test results.  Other health care providers may not recommend any screening for nonpregnant women who are considered low risk for pelvic cancer and who do not have symptoms. Ask your health care provider if a screening pelvic exam is right for you.  If you have had past treatment for cervical cancer or a condition that could lead to cancer, you need Pap tests and screening for cancer for at least 20 years after your treatment. If Pap tests have been discontinued, your risk factors (such as having a new sexual partner) need to be reassessed to determine if screening should resume. Some women have medical problems that increase the chance of getting cervical cancer. In these cases, your health care provider may recommend more frequent screening and Pap tests. Colorectal Cancer  This type of cancer can be detected and often prevented.  Routine colorectal cancer screening usually begins at 59 years of age and continues through 59 years of age.  Your health care provider may recommend screening at an earlier age if you have risk factors for colon cancer.  Your health care provider may also recommend using home test kits to check for hidden blood in the stool.  A small camera at the end of a tube can be used to examine your colon directly  (sigmoidoscopy or colonoscopy). This is done to check for the earliest forms of colorectal cancer.  Routine screening usually begins at age 55.  Direct examination of the colon should be repeated every 5-10 years through 59 years of age. However, you may need to be screened more often if early forms of precancerous polyps or small growths are found. Skin Cancer  Check your skin from head to toe regularly.  Tell your health care provider about any new moles or changes in moles, especially if there is a change in a mole's shape or color.  Also tell your health care provider if you have a mole that is larger than the size of a pencil eraser.  Always use sunscreen. Apply sunscreen liberally and repeatedly throughout the day.  Protect yourself by wearing long sleeves, pants, a wide-brimmed hat, and sunglasses whenever you are outside. HEART DISEASE, DIABETES, AND HIGH BLOOD PRESSURE   High blood pressure causes heart disease and increases the risk  of stroke. High blood pressure is more likely to develop in:  People who have blood pressure in the high end of the normal range (130-139/85-89 mm Hg).  People who are overweight or obese.  People who are African American.  If you are 77-22 years of age, have your blood pressure checked every 3-5 years. If you are 9 years of age or older, have your blood pressure checked every year. You should have your blood pressure measured twice--once when you are at a hospital or clinic, and once when you are not at a hospital or clinic. Record the average of the two measurements. To check your blood pressure when you are not at a hospital or clinic, you can use:  An automated blood pressure machine at a pharmacy.  A home blood pressure monitor.  If you are between 44 years and 53 years old, ask your health care provider if you should take aspirin to prevent strokes.  Have regular diabetes screenings. This involves taking a blood sample to check your  fasting blood sugar level.  If you are at a normal weight and have a low risk for diabetes, have this test once every three years after 59 years of age.  If you are overweight and have a high risk for diabetes, consider being tested at a younger age or more often. PREVENTING INFECTION  Hepatitis B  If you have a higher risk for hepatitis B, you should be screened for this virus. You are considered at high risk for hepatitis B if:  You were born in a country where hepatitis B is common. Ask your health care provider which countries are considered high risk.  Your parents were born in a high-risk country, and you have not been immunized against hepatitis B (hepatitis B vaccine).  You have HIV or AIDS.  You use needles to inject street drugs.  You live with someone who has hepatitis B.  You have had sex with someone who has hepatitis B.  You get hemodialysis treatment.  You take certain medicines for conditions, including cancer, organ transplantation, and autoimmune conditions. Hepatitis C  Blood testing is recommended for:  Everyone born from [birth year] through 1965.  Anyone with known risk factors for hepatitis C. Sexually transmitted infections (STIs)  You should be screened for sexually transmitted infections (STIs) including gonorrhea and chlamydia if:  You are sexually active and are younger than 59 years of age.  You are older than 60 years of age and your health care provider tells you that you are at risk for this type of infection.  Your sexual activity has changed since you were last screened and you are at an increased risk for chlamydia or gonorrhea. Ask your health care provider if you are at risk.  If you do not have HIV, but are at risk, it may be recommended that you take a prescription medicine daily to prevent HIV infection. This is called pre-exposure prophylaxis (PrEP). You are considered at risk if:  You are sexually active and do not regularly use condoms or  know the HIV status of your partner(s).  You take drugs by injection.  You are sexually active with a partner who has HIV. Talk with your health care provider about whether you are at high risk of being infected with HIV. If you choose to begin PrEP, you should first be tested for HIV. You should then be tested every 3 months for as long as you are taking PrEP.  PREGNANCY  If you are premenopausal and you may become pregnant, ask your health care provider about preconception counseling.  If you may become pregnant, take 400 to 800 micrograms (mcg) of folic acid every day.  If you want to prevent pregnancy, talk to your health care provider about birth control (contraception). OSTEOPOROSIS AND MENOPAUSE   Osteoporosis is a disease in which the bones lose minerals and strength with aging. This can result in serious bone fractures. Your risk for osteoporosis can be identified using a bone density scan.  If you are 36 years of age or older, or if you are at risk for osteoporosis and fractures, ask your health care provider if you should be screened.  Ask your health care provider whether you should take a calcium or vitamin D supplement to lower your risk for osteoporosis.  Menopause may have certain physical symptoms and risks.  Hormone replacement therapy may reduce some of these symptoms and risks. Talk to your health care provider about whether hormone replacement therapy is right for you.  HOME CARE INSTRUCTIONS   Schedule regular health, dental, and eye exams.  Stay current with your immunizations.   Do not use any tobacco products including cigarettes, chewing tobacco, or electronic cigarettes.  If you are pregnant, do not drink alcohol.  If you are breastfeeding, limit how much and how often you drink alcohol.  Limit alcohol intake to no more than 1 drink per day for nonpregnant women. One drink equals 12 ounces of beer, 5 ounces of wine, or 1 ounces of hard liquor.  Do  not use street drugs.  Do not share needles.  Ask your health care provider for help if you need support or information about quitting drugs.  Tell your health care provider if you often feel depressed.  Tell your health care provider if you have ever been abused or do not feel safe at home.   This information is not intended to replace advice given to you by your health care provider. Make sure you discuss any questions you have with your health care provider.   Document Released: 11/30/2010 Document Revised: 06/07/2014 Document Reviewed: 04/18/2013 Elsevier Interactive Patient Education [employer].

## 2015-05-02 NOTE — Assessment & Plan Note (Signed)
Reviewed recent labs, talked to her about overdue health maintenance. She is exercising and non-smoker. Given 10 year screening recommendations.

## 2015-05-08 ENCOUNTER — Encounter: Admitting: Internal Medicine

## 2015-06-19 ENCOUNTER — Telehealth: Payer: Self-pay | Admitting: Gastroenterology

## 2015-06-19 MED ORDER — ADALIMUMAB 40 MG/0.8ML ~~LOC~~ AJKT: 40.0000 mg | AUTO-INJECTOR | SUBCUTANEOUS | Status: DC

## 2015-06-19 NOTE — Telephone Encounter (Signed)
rx sent Patient notified

## 2015-07-21 ENCOUNTER — Ambulatory Visit (INDEPENDENT_AMBULATORY_CARE_PROVIDER_SITE_OTHER): Admitting: Gastroenterology

## 2015-07-21 ENCOUNTER — Encounter: Payer: Self-pay | Admitting: Gastroenterology

## 2015-07-21 ENCOUNTER — Other Ambulatory Visit (INDEPENDENT_AMBULATORY_CARE_PROVIDER_SITE_OTHER)

## 2015-07-21 VITALS — BP 118/70 | HR 76 | Ht 64.0 in | Wt 178.2 lb

## 2015-07-21 DIAGNOSIS — K5010 Crohn's disease of large intestine without complications: Secondary | ICD-10-CM

## 2015-07-21 LAB — CBC WITH DIFFERENTIAL/PLATELET
Basophils Absolute: 0 10*3/uL (ref 0.0–0.1)
Basophils Relative: 1 % (ref 0.0–3.0)
Eosinophils Absolute: 0.1 10*3/uL (ref 0.0–0.7)
Eosinophils Relative: 4.9 % (ref 0.0–5.0)
HCT: 37.4 % (ref 36.0–46.0)
Hemoglobin: 12.8 g/dL (ref 12.0–15.0)
Lymphocytes Relative: 43 % (ref 12.0–46.0)
Lymphs Abs: 1.2 10*3/uL (ref 0.7–4.0)
MCHC: 34.3 g/dL (ref 30.0–36.0)
MCV: 93.6 fl (ref 78.0–100.0)
Monocytes Absolute: 0.3 10*3/uL (ref 0.1–1.0)
Monocytes Relative: 9.8 % (ref 3.0–12.0)
Neutro Abs: 1.2 10*3/uL — ABNORMAL LOW (ref 1.4–7.7)
Neutrophils Relative %: 41.3 % — ABNORMAL LOW (ref 43.0–77.0)
Platelets: 260 10*3/uL (ref 150.0–400.0)
RBC: 4 Mil/uL (ref 3.87–5.11)
RDW: 13.5 % (ref 11.5–15.5)
WBC: 2.9 10*3/uL — ABNORMAL LOW (ref 4.0–10.5)

## 2015-07-21 LAB — COMPREHENSIVE METABOLIC PANEL
ALT: 22 U/L (ref 0–35)
AST: 19 U/L (ref 0–37)
Albumin: 4.6 g/dL (ref 3.5–5.2)
Alkaline Phosphatase: 82 U/L (ref 39–117)
BUN: 16 mg/dL (ref 6–23)
CO2: 29 mEq/L (ref 19–32)
Calcium: 9.6 mg/dL (ref 8.4–10.5)
Chloride: 106 mEq/L (ref 96–112)
Creatinine, Ser: 0.69 mg/dL (ref 0.40–1.20)
GFR: 111.67 mL/min (ref >60.00)
Glucose, Bld: 91 mg/dL (ref 70–99)
Potassium: 3.9 mEq/L (ref 3.5–5.1)
Sodium: 141 mEq/L (ref 135–145)
Total Bilirubin: 0.9 mg/dL (ref 0.2–1.2)
Total Protein: 7.3 g/dL (ref 6.0–8.3)

## 2015-07-21 LAB — LIPASE: Lipase: 18 U/L (ref 11.0–59.0)

## 2015-07-21 NOTE — Patient Instructions (Signed)
Your physician has requested that you go to the basement for the following lab work before leaving today:CMP, CBC, Lipase.  Thank you for choosing me and [facility].  Pricilla Riffle. Dagoberto Ligas., MD., Marval Regal

## 2015-07-21 NOTE — Progress Notes (Signed)
    History of Present Illness: This is a 60 year old female returning for follow-up of Crohn's colitis. She states she has occasional brief left upper quadrant pain associated with movement that may last a few minutes. Brief LUQ pain is not associated with any GI function. She has no other complaints.  Current Medications, Allergies, Past Medical History, Past Surgical History, Family History and Social History were reviewed in [employer] record.  Physical Exam: General: Well developed, well nourished, no acute distress Head: Normocephalic and atraumatic Eyes:  sclerae anicteric, EOMI Ears: Normal auditory acuity Mouth: No deformity or lesions Lungs: Clear throughout to auscultation Heart: Regular rate and rhythm; no murmurs, rubs or bruits Abdomen: Soft, non tender and non distended. No masses, hepatosplenomegaly or hernias noted. Normal Bowel sounds Musculoskeletal: Symmetrical with no gross deformities  Pulses:  Normal pulses noted Extremities: No clubbing, cyanosis, edema or deformities noted Neurological: Alert oriented x 4, grossly nonfocal Psychological:  Alert and cooperative. Normal mood and affect  Assessment and Recommendations:  1. Crohns colitis. Continue Humira and 6 MP at current dosages. CBC, CMET, lipase today. Follow up in 3 months.  2. GERD and epigastric pain, symptoms under control. Antireflux regimen reviewed. Continue omeprazole 40 mg qam.   3. History of mildly elevated ALT. LFTs in 07/2014 were normal. Repeat blood work today.

## 2015-08-13 ENCOUNTER — Encounter: Payer: Self-pay | Admitting: Podiatry

## 2015-08-13 ENCOUNTER — Ambulatory Visit (INDEPENDENT_AMBULATORY_CARE_PROVIDER_SITE_OTHER): Admitting: Podiatry

## 2015-08-13 VITALS — BP 124/75 | HR 80 | Temp 98.2°F | Resp 14

## 2015-08-13 DIAGNOSIS — M2040 Other hammer toe(s) (acquired), unspecified foot: Secondary | ICD-10-CM

## 2015-08-13 DIAGNOSIS — M2011 Hallux valgus (acquired), right foot: Secondary | ICD-10-CM

## 2015-08-13 DIAGNOSIS — M2012 Hallux valgus (acquired), left foot: Secondary | ICD-10-CM

## 2015-08-13 NOTE — Patient Instructions (Signed)
Today your foot examination revealed adequate pulsation and feelings the right and left feet There were to darkened areas under side of the great toenails were the second toe pressed against the big toenail causing small bleeding in the area. Continue to observe these areas to make sure that the areas continue to grow out towards and your toe. Return if these areas do not continue to gradually grow towards any of toe in 3-6 months The remaining toenails to right and 3 left are normal trophic in appearance and knee no active treatment The fifth toenails are thick associated with repetitive microtrauma from the rotation of the fifth toe against the shoe Overall the bunion bunions and overlapping toes probably cause crowding in your shoes and may cause occasional from an irritation to the toes are toenails

## 2015-08-13 NOTE — Progress Notes (Signed)
   Subjective:    Patient ID: Brianna Santana, female    DOB: 02/22/1956, 60 y.o.   MRN: [id number]  HPI   This patient presents today concerned about symmetrical black.areas beneath the hallux toenails noticed after she remove her toenail polish in the last several weeks. There is no pain in these areas. Also patient describes a lifting of these second right and third left toenails in the past 3 months which she trimmed. At this time she has noticed a slight yellow appearance in the area and would like to have these nails evaluated.  Review of Systems  Eyes: Positive for itching.  Respiratory: Positive for cough, shortness of breath and wheezing.   Gastrointestinal: Positive for abdominal pain and diarrhea.       Abdominal pain  Musculoskeletal: Positive for back pain.       Joint pain  Skin:       Change in nails  Neurological: Positive for numbness.  Hematological: Bruises/bleeds easily.  All other systems reviewed and are negative.      Objective:   Physical Exam  Orientated 3  Vascular: No peripheral edema bilaterally DP and PT pulses 2/4 bilaterally Capillary reflex immediate bilaterally  Neurological: Sensation to 10 g monofilament wire intact 5/5 bilaterally Vibratory sensation reactive bilaterally Ankle reflex equal and reactive bilaterally  Dermatological: No open skin lesions bilaterally On the lateral aspect of the hallux nails there is a 1 mm punctate darkened area. These areas are symmetrical in appearance. Please note that patient has HAV deformity with overlapping second toes compressing against the hallux toenails Texture and turgor of toenails 1-4 bilaterally within normal limits Fifth toenails are hypertrophic associated with varus rotation of fifth toes  Musculoskeletal: HAV bilaterally Overlapping second toes bilaterally Varus rotation fifth toes bilaterally         Assessment & Plan:   Assessment: Satisfactory neurovascular  status Punctate dried blood beneath the lateral aspect the hallux toenails associated with trauma of overlapping second toes and HAV deformities, bilaterally Non-dystrophic toenails 1-4 bilaterally Hypertrophic fifth toenail plates most likely associate with microtrauma 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 I made her aware that because the darkened areas were exactly symmetrical exact same size and developed at the same time there were associated with the overlapping second toe compressing against the hallux nail, bilaterally. I instructed her to observe these areas to make sure the darkened areas gradually migrated distally and resolved in the next 3-6 months. I told her that if he did not resolve to return for further evaluation.  No treatment indicated at this time for the lesser toenails  Reappoint at patient's request

## 2015-09-02 ENCOUNTER — Other Ambulatory Visit: Payer: Self-pay | Admitting: Allergy and Immunology

## 2015-09-02 MED ORDER — MONTELUKAST SODIUM 10 MG PO TABS: 10.0000 mg | ORAL_TABLET | Freq: Every day | ORAL | Status: DC

## 2015-09-02 NOTE — Telephone Encounter (Signed)
Sent script into [facility]. Informed patient.

## 2015-09-02 NOTE — Telephone Encounter (Signed)
Pt called and said that she needs a refill on montelukast 90 days. Call into [facility].

## 2015-09-03 ENCOUNTER — Telehealth: Payer: Self-pay | Admitting: Gastroenterology

## 2015-09-03 MED ORDER — ADALIMUMAB 40 MG/0.8ML ~~LOC~~ AJKT: 40.0000 mg | AUTO-INJECTOR | SUBCUTANEOUS | Status: DC

## 2015-09-03 MED ORDER — MERCAPTOPURINE 50 MG PO TABS: ORAL_TABLET | ORAL | Status: DC

## 2015-09-03 NOTE — Telephone Encounter (Signed)
Patient needs refill for mercaptopurine and Humira. Prescriptions sent to [facility].

## 2015-09-16 ENCOUNTER — Ambulatory Visit (INDEPENDENT_AMBULATORY_CARE_PROVIDER_SITE_OTHER): Admitting: Women's Health

## 2015-09-16 ENCOUNTER — Encounter: Payer: Self-pay | Admitting: Women's Health

## 2015-09-16 VITALS — BP 124/76 | Ht 63.75 in | Wt 178.0 lb

## 2015-09-16 DIAGNOSIS — M8580 Other specified disorders of bone density and structure, unspecified site: Secondary | ICD-10-CM

## 2015-09-16 DIAGNOSIS — Z01419 Encounter for gynecological examination (general) (routine) without abnormal findings: Secondary | ICD-10-CM

## 2015-09-16 DIAGNOSIS — N898 Other specified noninflammatory disorders of vagina: Secondary | ICD-10-CM

## 2015-09-16 DIAGNOSIS — R21 Rash and other nonspecific skin eruption: Secondary | ICD-10-CM

## 2015-09-16 DIAGNOSIS — N9489 Other specified conditions associated with female genital organs and menstrual cycle: Secondary | ICD-10-CM | POA: Diagnosis not present

## 2015-09-16 LAB — WET PREP FOR TRICH, YEAST, CLUE
Clue Cells Wet Prep HPF POC: NONE SEEN
Trich, Wet Prep: NONE SEEN
WBC, Wet Prep HPF POC: NONE SEEN
Yeast Wet Prep HPF POC: NONE SEEN

## 2015-09-16 MED ORDER — CLOBETASOL PROPIONATE 0.05 % EX CREA: 1.0000 "application " | TOPICAL_CREAM | CUTANEOUS | Status: DC | PRN

## 2015-09-16 NOTE — Progress Notes (Signed)
CHAI VERDEJO 1955-08-06 [id number]    History:    Presents for annual exam.  2002 TVH for fibroids. Normal Pap and mammogram history. 2016 T score -2.2 at spine and -1.3 at hip FRAX 7.8%/0.8% stable from previous DEXA's. 2013 diagnosed with Crohn's doing well, 2015 colonoscopy negative polyps 60-year-old recall. Has had Pneumovax not Zostavax.  Past medical history, past surgical history, family history and social history were all reviewed and documented in the EPIC chart. Does present history work. Has 3 children. One daughter lives in [place] in the [employer]. Numerous family members with diabetes and hypertension. Mother died at age 15 and lived with an aunt and uncle which was very stressful.  ROS:  A ROS was performed and pertinent positives and negatives are included.  Exam:  Filed Vitals:   09/16/15 1047  BP: 124/76    General appearance:  Normal Thyroid:  Enlarged, without palpable masses or nodularity. Respiratory  Auscultation:  Clear without wheezing or rhonchi Cardiovascular  Auscultation:  Regular rate, without rubs, murmurs or gallops  Edema/varicosities:  Not grossly evident Abdominal  Soft,nontender, without masses, guarding or rebound.  Liver/spleen:  No organomegaly noted  Hernia:  None appreciated  Skin  Inspection:  Grossly normal   Breasts: Examined lying and sitting.     Right: Without masses, retractions, discharge or axillary adenopathy.     Left: Without masses, retractions, discharge or axillary adenopathy. Gentitourinary   Inguinal/mons:  Normal without inguinal adenopathy  External genitalia:  Normal  BUS/Urethra/Skene's glands:  Normal  Vagina:  Normal  Cervix:  Absent uterus absent Adnexa/parametria:     Rt: Without masses or tenderness.   Lt: Without masses or tenderness.  Anus and perineum: Normal  Digital rectal exam: Normal sphincter tone without palpated masses or tenderness  Assessment/Plan:  60 y.o. MBF G3 P3 for annual exam with  complaint of vaginal odor, wet prep negative.  2002 TVH for fibroids on no HRT. Osteopenia without elevated FRAX 2013 Crohn's Labs-primary care Enlarged thyroid status post radiation/endocrinologist  Plan: SBE's, continue annual screening mammogram 3-D tomography reviewed and encouraged history of dense breast. Reviewed importance of increasing regular exercise, home safety and fall prevention discussed, calcium rich diet and vitamin D 2000 daily encouraged. Pap with HR HPV typing, new screening guidelines reviewed. Encouraged Zostavax at primary care.    Huel [facility], 12:50 PM 09/16/2015

## 2015-09-16 NOTE — Patient Instructions (Signed)

## 2016-01-16 ENCOUNTER — Ambulatory Visit (INDEPENDENT_AMBULATORY_CARE_PROVIDER_SITE_OTHER): Admitting: Endocrinology

## 2016-01-16 ENCOUNTER — Encounter: Payer: Self-pay | Admitting: Endocrinology

## 2016-01-16 VITALS — BP 122/84 | HR 85 | Ht 63.5 in | Wt 178.0 lb

## 2016-01-16 DIAGNOSIS — E079 Disorder of thyroid, unspecified: Secondary | ICD-10-CM

## 2016-01-16 LAB — TSH: TSH: 3.72 u[IU]/mL (ref 0.35–4.50)

## 2016-01-16 NOTE — Progress Notes (Signed)
Subjective:    Patient ID: Brianna Santana, female    DOB: 1956/02/10, 60 y.o.   MRN: [id number]  HPI Pt returns for f/u of right thyroid mass (dx'ed 2000; she had hyperthyroidism, so she was rx'ed with RAI; prior to the rx, she had elevated iodine uptake, but she had neither [name] nor nuc med imaging; in 2016, she had bx, which showed Beth Cat 2; also in 2016, she had [name], which showed diffuse nodular enlargement of the right lobe measuring just under 5 cm in maximum diameter).  Pt says she does not notice the nodule.  pt states she feels well in general.   Past Medical History:  Diagnosis Date  . Allergy   . Arthritis   . C. difficile colitis 2011 and 07/2010  . Cervical dysplasia   . Hyperplastic polyp of intestine 09/2007    Past Surgical History:  Procedure Laterality Date  . BREAST LUMPECTOMY  1980's, 2000   Right Breast x 2  . COLONOSCOPY    . COLPOSCOPY    . FLEXIBLE SIGMOIDOSCOPY  03/29/2012   Procedure: FLEXIBLE SIGMOIDOSCOPY;  Surgeon: Ladene Artist, MD,FACG;  Location: [facility];  Service: Endoscopy;  Laterality: N/A;  . TONSILLECTOMY    . TOTAL ABDOMINAL HYSTERECTOMY  2000    Social History   Social History  . Marital status: Married    Spouse name: N/A  . Number of children: 3  . Years of education: N/A   Occupational History  . Retired Unemployed   Social History Main Topics  . Smoking status: Former Smoker    Years: 10.00    Types: Cigarettes    Quit date: 09/07/1988  . Smokeless tobacco: Never Used  . Alcohol use No  . Drug use: No  . Sexual activity: Yes    Birth control/ protection: Surgical     Comment: INTERCOURSE AGE 44, SEXUAL PARTNERS LESS THAN 5   Other Topics Concern  . Not on file   Social History Narrative  . No narrative on file    Current Outpatient Prescriptions on File Prior to Visit  Medication Sig Dispense Refill  . Adalimumab (HUMIRA PEN) 40 MG/0.8ML PNKT Inject 40 mg into the skin every 14 (fourteen) days. 6 each 1  .  albuterol (PROAIR HFA) 108 (90 BASE) MCG/ACT inhaler Inhale 2 puffs into the lungs every 4 (four) hours as needed. For shortness of breath or wheezing    . albuterol (PROAIR HFA) 108 (90 BASE) MCG/ACT inhaler Inhale 2 puffs into the lungs every 4 (four) hours as needed for wheezing or shortness of breath. 1 Inhaler 0  . betamethasone valerate ointment (VALISONE) 0.1 % Apply topically 2 (two) times daily. 30 g 0  . budesonide-formoterol (SYMBICORT) 80-4.5 MCG/ACT inhaler Inhale 2 puffs into the lungs 2 (two) times daily.    . cetirizine (ZYRTEC) 10 MG tablet Take 10 mg by mouth daily.    . clobetasol cream (TEMOVATE) 3.09 % Apply 1 application topically as needed. For rash 30 g 1  . gabapentin (NEURONTIN) 300 MG capsule Take 300 mg by mouth 2 (two) times daily.     . meloxicam (MOBIC) 15 MG tablet Take 15 mg by mouth daily.    . mercaptopurine (PURINETHOL) 50 MG tablet Take 1 1/2 tablet by mouth daily 135 tablet 1  . montelukast (SINGULAIR) 10 MG tablet Take 1 tablet (10 mg total) by mouth at bedtime. 90 tablet 1  . Multiple Vitamin (MULTIVITAMIN WITH MINERALS) TABS Take 1 tablet by  mouth daily.    Marland Kitchen omeprazole (PRILOSEC) 40 MG capsule TAKE 1 CAPSULE BY MOUTH DAILY FOR HEARTBURN 90 capsule 3  . acetaminophen-codeine (TYLENOL #3) 300-30 MG per tablet Take 1 tablet by mouth every 6 (six) hours as needed for moderate pain. (Patient not taking: Reported on 01/16/2016) 30 tablet 0   No current facility-administered medications on file prior to visit.     Allergies  Allergen Reactions  . Amoxicillin Itching  . Levofloxacin Other (See Comments)    Doesn't remember reaction  . Seasonal Ic [Cholestatin]   . Tramadol     Major Headaches    Family History  Problem Relation Age of Onset  . Diabetes Father   . Diabetes Sister     x 2  . Hypertension Sister     x 2  . Diabetes Brother   . Hypertension Brother   . Colon cancer Neg Hx   . Esophageal cancer Neg Hx   . Pancreatic cancer Neg Hx   .  Rectal cancer Neg Hx   . Stomach cancer Neg Hx     BP 122/84   Pulse 85   Ht 5' 3.5" (1.613 m)   Wt 178 lb (80.7 kg)   SpO2 95%   BMI 31.04 kg/m   Review of Systems Denies dysphagia and sob.      Objective:   Physical Exam VITAL SIGNS:  See vs page.   GENERAL: no distress.   NECK: the right thyroid mass is easily palpable.  It is freely mobile.     Lab Results  Component Value Date   TSH 3.72 01/16/2016   T3TOTAL 105.2 12/16/2014   T4TOTAL 7.1 12/16/2014       Assessment & Plan:  Thyroid mass, clinically stable.  Euthyroid after RAI

## 2016-01-16 NOTE — Patient Instructions (Signed)
A thyroid blood test is requested for you today.  We'll let you know about the results. Let's recheck the ultrasound.  you will receive a phone call, about a day and time for an appointment. Please return in 1 year.

## 2016-01-23 ENCOUNTER — Ambulatory Visit
Admission: RE | Admit: 2016-01-23 | Discharge: 2016-01-23 | Disposition: A | Discharge status: Home / Self Care | Source: Ambulatory Visit | Attending: Endocrinology | Admitting: Endocrinology

## 2016-01-23 DIAGNOSIS — E079 Disorder of thyroid, unspecified: Secondary | ICD-10-CM

## 2016-02-17 ENCOUNTER — Ambulatory Visit: Admitting: Endocrinology

## 2016-02-25 ENCOUNTER — Ambulatory Visit (INDEPENDENT_AMBULATORY_CARE_PROVIDER_SITE_OTHER): Admitting: Allergy & Immunology

## 2016-02-25 ENCOUNTER — Encounter: Payer: Self-pay | Admitting: Allergy & Immunology

## 2016-02-25 ENCOUNTER — Encounter (INDEPENDENT_AMBULATORY_CARE_PROVIDER_SITE_OTHER): Payer: Self-pay

## 2016-02-25 VITALS — BP 110/70 | HR 70 | Temp 98.2°F | Resp 16 | Ht 64.0 in | Wt 179.8 lb

## 2016-02-25 DIAGNOSIS — J310 Chronic rhinitis: Secondary | ICD-10-CM | POA: Diagnosis not present

## 2016-02-25 DIAGNOSIS — J4540 Moderate persistent asthma, uncomplicated: Secondary | ICD-10-CM | POA: Diagnosis not present

## 2016-02-25 MED ORDER — ALBUTEROL SULFATE HFA 108 (90 BASE) MCG/ACT IN AERS: 4.0000 | INHALATION_SPRAY | RESPIRATORY_TRACT | 1 refills | Status: DC | PRN

## 2016-02-25 MED ORDER — CETIRIZINE HCL 10 MG PO TABS: 10.0000 mg | ORAL_TABLET | Freq: Every day | ORAL | 2 refills | Status: DC

## 2016-02-25 MED ORDER — BUDESONIDE-FORMOTEROL FUMARATE 80-4.5 MCG/ACT IN AERO: 2.0000 | INHALATION_SPRAY | Freq: Two times a day (BID) | RESPIRATORY_TRACT | 6 refills | Status: DC

## 2016-02-25 MED ORDER — MONTELUKAST SODIUM 10 MG PO TABS: 10.0000 mg | ORAL_TABLET | Freq: Every day | ORAL | 3 refills | Status: DC

## 2016-02-25 NOTE — Progress Notes (Signed)
FOLLOW UP  Date of Service/Encounter:  02/25/16   Assessment:   Moderate persistent asthma, uncomplicated  Chronic rhinitis   Asthma Reportables:  Severity: moderate persistent  Risk: low Control: not well controlled  Seasonal Influenza Vaccine: no but encouraged    Plan/Recommendations:   1. Moderate persistent asthma, uncomplicated - Continue with Singular 51m daily. - Continue with Symbicort 80/4.5 two puffs twice daily. - Use a spacer OR the three finger technique.  - Continue with albuterol 4 puffs every 4-6 hours as needed for coughing/wheezing. - Lung function testing looks good today.   2. Chronic rhinitis - Continue with nasal saline spray as needed.  - The singulair should help your nasal symptoms as well.  3. Return in about 6 months (around 08/24/2016).   Subjective:   DMICHAELLA IMAIis a 60y.o. female presenting today for follow up of  Chief Complaint  Patient presents with  . Medication Refill    Singulair  .  DKatrine Cohohas a history of the following: Patient Active Problem List   Diagnosis Date Noted  . Routine general medical examination at a health care facility 05/02/2015  . Thyroid mass 01/16/2015  . Drug allergy 07/30/2014  . Sinusitis, acute 07/23/2014  . Crohn's disease ([place] 03/18/2014  . Osteopenia 09/11/2013  . Hyperthyroidism   . GERD 11/02/2007  . Asthma 10/31/2007    History obtained from: chart review and patient.  DKatrine Cohowas referred by EHoyt Koch MD.     DCathlynis a 60y.o. female presenting for a follow up visit for asthma and allergies. DLucindawas last seen in September 2016 by Dr. BVerlin Fester At that time, she was doing well. Her spirometry was stable and there were considerations of stopping her Symbicort. She also has a history of mixed rhinitis. Her last testing was performed in January 2013 and was positive to dust mites, cockroach, and equivocal to a couple of molds.  Juli's  asthma has been well controlled. She has not required rescue medication, experienced nocturnal awakenings due to lower respiratory symptoms, nor have activities of daily living been limited. She remains Singular 145mdaily, Symbicort 80/4.5 two puffs twice daily. She does not use a space and does not use the three finger technique either. She uses nasal saline spray for her nose. She is also on Singulair, which should provide some help. She is quite happy with how well she is doing overall. She is not interested in stepdown therapy at this time. She has had no ER visits or prednisone courses.  Ms. ClKreischers otherwise doing well. She does have a history of back pain and feels like she is developing lordosis. There is a strong family history of osteoporosis, including in some males. She does not think that she has ever had a DEXA scan to evaluate for this. Otherwise, there have been no changes to the past medical history, surgical history, family history, or social history.     Review of Systems: a 14-point review of systems is pertinent for what is mentioned in HPI.  Otherwise, all other systems were negative. Constitutional: negative other than that listed in the HPI Eyes: negative other than that listed in the HPI Ears, nose, mouth, throat, and face: negative other than that listed in the HPI Respiratory: negative other than that listed in the HPI Cardiovascular: negative other than that listed in the HPI Gastrointestinal: negative other than that listed in the HPI Genitourinary: negative other than that listed  in the HPI Integument: negative other than that listed in the HPI Hematologic: negative other than that listed in the HPI Musculoskeletal: negative other than that listed in the HPI Neurological: negative other than that listed in the HPI Allergy/Immunologic: negative other than that listed in the HPI    Objective:   Blood pressure 110/70, pulse 70, temperature 98.2 F (36.8 C),  temperature source Oral, resp. rate 16, height 5' 4"  (1.626 m), weight 179 lb 12.8 oz (81.6 kg), SpO2 97 %. Body mass index is 30.86 kg/m.   Physical Exam:  General: Alert, interactive, in no acute distress. Very pleasant and smiling female. HEENT: TMs pearly gray, turbinates non-edematous without discharge, post-pharynx mildly erythematous. Neck: Supple without thyromegaly. Lungs: Clear to auscultation without wheezing, rhonchi or rales. No increased work of breathing. CV: Normal S1, S2 without murmurs. Capillary refill <2 seconds.  Abdomen: Nondistended, nontender. No guarding. Skin: Warm and dry, without lesions or rashes. Extremities:  No clubbing, cyanosis or edema. Neuro:   Grossly intact. No focal deficits.   Diagnostic studies:  Spirometry: results normal (FEV1: 1.2/88%, FVC: 2.49/97%, FEV1/FVC: 73%).    Spirometry consistent with normal pattern   Allergy Studies: None     Salvatore Marvel, MD [facility] [place]

## 2016-02-25 NOTE — Patient Instructions (Addendum)
1. Moderate persistent asthma, uncomplicated - Continue with Singular 35m daily. - Continue with Symbicort 80/4.5 two puffs twice daily. - Use a spacer OR the three finger technique.  - Continue with albuterol 4 puffs every 4-6 hours as needed for coughing/wheezing. - Lung function testing looks good today.   2. Chronic rhinitis - Continue with nasal saline spray as needed.  - The singulair should help your nasal symptoms as well.  3. Return in about 6 months (around 08/24/2016).  Please inform u[place]of any Emergency Department visits, hospitalizations, or changes in symptoms. Call u[place]abefore going to the ED for breathing or allergy symptoms since we might be able to fit you in for a sick visit. Feel free to contact u[place]anytime with any questions, problems, or concerns.  It was a pleasure to meet you today!   Websites that have reliable patient information: 1. American Academy of Asthma, Allergy, and Immunology: www.aaaai.org 2. Food Allergy Research and Education (FARE): foodallergy.org 3. Mothers of Asthmatics: http://www.asthmacommunitynetwork.org 4. American College of Allergy, Asthma, and Immunology: www.acaai.org

## 2016-02-26 ENCOUNTER — Other Ambulatory Visit: Payer: Self-pay | Admitting: Allergy & Immunology

## 2016-02-26 MED ORDER — ALBUTEROL SULFATE HFA 108 (90 BASE) MCG/ACT IN AERS: 4.0000 | INHALATION_SPRAY | RESPIRATORY_TRACT | 1 refills | Status: DC | PRN

## 2016-02-26 MED ORDER — BUDESONIDE-FORMOTEROL FUMARATE 80-4.5 MCG/ACT IN AERO: 2.0000 | INHALATION_SPRAY | Freq: Two times a day (BID) | RESPIRATORY_TRACT | 6 refills | Status: DC

## 2016-02-26 NOTE — Telephone Encounter (Signed)
Scripts sent into [facility].

## 2016-02-26 NOTE — Telephone Encounter (Signed)
Patient was seen yesterday, 02/25/16, by Dr. Ernst Bowler. She wanted to let his nurse know that she does have an up to date Pro air that is good until 11/2016. She would like a new Pro air and Symbicort called into [facility]. She was given a sample of Symbicort yesterday and she said to go ahead and call it in.

## 2016-03-04 ENCOUNTER — Telehealth: Payer: Self-pay | Admitting: Gastroenterology

## 2016-03-04 MED ORDER — MERCAPTOPURINE 50 MG PO TABS: ORAL_TABLET | ORAL | 1 refills | Status: DC

## 2016-03-04 MED ORDER — ADALIMUMAB 40 MG/0.8ML ~~LOC~~ AJKT: 40.0000 mg | AUTO-INJECTOR | SUBCUTANEOUS | 1 refills | Status: DC

## 2016-03-04 NOTE — Telephone Encounter (Signed)
Pt states she also needs a refill of mercaptopurine (PURINETHOL).

## 2016-03-04 NOTE — Telephone Encounter (Signed)
Pt said she is returning your call °

## 2016-03-04 NOTE — Telephone Encounter (Signed)
Left a message for patient to return my call. 

## 2016-03-04 NOTE — Telephone Encounter (Signed)
Informed patient she is due for follow up visit. Patient scheduled appt for 04/26/16. Mercaptopurine and Humira have been sent to mail order pharmacy. Meds by mail [facility]. Patient verbalized understanding.

## 2016-03-26 ENCOUNTER — Other Ambulatory Visit: Payer: Self-pay | Admitting: Internal Medicine

## 2016-03-26 DIAGNOSIS — Z1231 Encounter for screening mammogram for malignant neoplasm of breast: Secondary | ICD-10-CM

## 2016-03-30 ENCOUNTER — Ambulatory Visit
Admission: RE | Admit: 2016-03-30 | Discharge: 2016-03-30 | Disposition: A | Discharge status: Home / Self Care | Source: Ambulatory Visit | Attending: Internal Medicine | Admitting: Internal Medicine

## 2016-03-30 DIAGNOSIS — Z1231 Encounter for screening mammogram for malignant neoplasm of breast: Secondary | ICD-10-CM

## 2016-04-05 ENCOUNTER — Encounter: Payer: Self-pay | Admitting: Internal Medicine

## 2016-04-05 ENCOUNTER — Ambulatory Visit (INDEPENDENT_AMBULATORY_CARE_PROVIDER_SITE_OTHER): Admitting: Internal Medicine

## 2016-04-05 ENCOUNTER — Ambulatory Visit (INDEPENDENT_AMBULATORY_CARE_PROVIDER_SITE_OTHER)
Admission: RE | Admit: 2016-04-05 | Discharge: 2016-04-05 | Disposition: A | Discharge status: Home / Self Care | Source: Ambulatory Visit | Attending: Internal Medicine | Admitting: Internal Medicine

## 2016-04-05 ENCOUNTER — Telehealth: Payer: Self-pay | Admitting: *Deleted

## 2016-04-05 VITALS — BP 132/76 | HR 73 | Temp 98.2°F | Resp 18 | Ht 64.0 in | Wt 177.0 lb

## 2016-04-05 DIAGNOSIS — M546 Pain in thoracic spine: Secondary | ICD-10-CM | POA: Diagnosis not present

## 2016-04-05 DIAGNOSIS — Z23 Encounter for immunization: Secondary | ICD-10-CM

## 2016-04-05 MED ORDER — ACETAMINOPHEN-CODEINE #3 300-30 MG PO TABS: 1.0000 | ORAL_TABLET | Freq: Four times a day (QID) | ORAL | 0 refills | Status: DC | PRN

## 2016-04-05 MED ORDER — TIZANIDINE HCL 2 MG PO CAPS: 2.0000 mg | ORAL_CAPSULE | Freq: Every day | ORAL | 6 refills | Status: DC

## 2016-04-05 MED ORDER — POTASSIUM CHLORIDE ER 10 MEQ PO TBCR: 10.0000 meq | EXTENDED_RELEASE_TABLET | Freq: Every day | ORAL | 3 refills | Status: DC

## 2016-04-05 NOTE — Assessment & Plan Note (Signed)
Checking CXR due to immunosupression for atypical pneumonia. No rash on the skin and not helped by gabapentin making shingles unlikely. Will try tylenol #3 for pain. Suspect muscular etiology.

## 2016-04-05 NOTE — Progress Notes (Signed)
Pre visit review using our clinic review tool, if applicable. No additional management support is needed unless otherwise documented below in the visit note. 

## 2016-04-05 NOTE — Patient Instructions (Signed)
We have given you the tylenol #3 to try for the pain. We are going to check an x-ray of the chest to look for any cause.   We have also sent in the refills of the potassium and the tizanidine.

## 2016-04-05 NOTE — Progress Notes (Signed)
   Subjective:    Patient ID: Brianna Santana, female    DOB: 02-Sep-1955, 60 y.o.   MRN: [id number]  HPI The patient is a 60 YO female coming in for left side pain in the shoulder blade coming around and into the left breast. Going on for about 3 weeks. Had mammogram last week which was normal. No new lumps in her breast. Denies injury or overuse prior to the pain starting. She does have chronic pain in the shoulder and left arm s/p accident but this is different. No change in numbness or weakness in her arms. No chest pains. No SOB or breathing problems. Denies cough. No fevers or chills. Does not worsen with breathing. Some worse with twisting.   Review of Systems  Constitutional: Negative for activity change, appetite change, fatigue, fever and unexpected weight change.  HENT: Negative.   Respiratory: Negative.   Cardiovascular: Negative.   Gastrointestinal: Negative.   Musculoskeletal: Positive for arthralgias, back pain and myalgias. Negative for neck pain and neck stiffness.  Neurological: Negative.       Objective:   Physical Exam  Constitutional: She is oriented to person, place, and time. She appears well-developed and well-nourished.  HENT:  Head: Normocephalic and atraumatic.  Eyes: EOM are normal.  Neck: Normal range of motion.  Cardiovascular: Normal rate and regular rhythm.   Pulmonary/Chest: Effort normal and breath sounds normal. No respiratory distress. She has no wheezes. She has no rales.  Abdominal: Soft. She exhibits no distension. There is no tenderness. There is no rebound.  Musculoskeletal: She exhibits tenderness. She exhibits no edema.  Pain in the left shoulder blade and around to the left breast, no rash on the skin and no LAD in the axillae and no lumps or lesions in the breast.   Neurological: She is alert and oriented to person, place, and time. Coordination normal.  Skin: Skin is warm and dry.   Vitals:   04/05/16 1110  BP: 132/76  Pulse: 73  Resp:  18  Temp: 98.2 F (36.8 C)  TempSrc: Oral  SpO2: 98%  Weight: 177 lb (80.3 kg)  Height: 5' 4"  (1.626 m)      Assessment & Plan:  Flu shot given at visit.

## 2016-04-05 NOTE — Telephone Encounter (Signed)
Pt called c/o left upper back discomfort x 3 weeks now sharp pain, pt did radiate to left breast, but no breast pain,. Pt asked what should she do, had recent mammogram was normal, she did not feel any breast lumps. I advised pt to contact PCP to be seen due to pain, as the pain is not located in breast. Pt verbalized she understood.

## 2016-04-26 ENCOUNTER — Ambulatory Visit (INDEPENDENT_AMBULATORY_CARE_PROVIDER_SITE_OTHER): Admitting: Gastroenterology

## 2016-04-26 ENCOUNTER — Encounter: Payer: Self-pay | Admitting: Gastroenterology

## 2016-04-26 ENCOUNTER — Other Ambulatory Visit (INDEPENDENT_AMBULATORY_CARE_PROVIDER_SITE_OTHER)

## 2016-04-26 VITALS — BP 102/72 | HR 80 | Ht 64.0 in | Wt 178.5 lb

## 2016-04-26 DIAGNOSIS — K219 Gastro-esophageal reflux disease without esophagitis: Secondary | ICD-10-CM

## 2016-04-26 DIAGNOSIS — K5010 Crohn's disease of large intestine without complications: Secondary | ICD-10-CM

## 2016-04-26 LAB — CBC WITH DIFFERENTIAL/PLATELET
Basophils Absolute: 0 10*3/uL (ref 0.0–0.1)
Basophils Relative: 0.8 % (ref 0.0–3.0)
Eosinophils Absolute: 0 10*3/uL (ref 0.0–0.7)
Eosinophils Relative: 0.1 % (ref 0.0–5.0)
HCT: 37.8 % (ref 36.0–46.0)
Hemoglobin: 12.7 g/dL (ref 12.0–15.0)
Lymphocytes Relative: 21.1 % (ref 12.0–46.0)
Lymphs Abs: 0.8 10*3/uL (ref 0.7–4.0)
MCHC: 33.6 g/dL (ref 30.0–36.0)
MCV: 93.2 fl (ref 78.0–100.0)
Monocytes Absolute: 0.2 10*3/uL (ref 0.1–1.0)
Monocytes Relative: 5.5 % (ref 3.0–12.0)
Neutro Abs: 2.7 10*3/uL (ref 1.4–7.7)
Neutrophils Relative %: 72.5 % (ref 43.0–77.0)
Platelets: 321 10*3/uL (ref 150.0–400.0)
RBC: 4.05 Mil/uL (ref 3.87–5.11)
RDW: 14.4 % (ref 11.5–15.5)
WBC: 3.7 10*3/uL — ABNORMAL LOW (ref 4.0–10.5)

## 2016-04-26 LAB — COMPREHENSIVE METABOLIC PANEL
ALT: 34 U/L (ref 0–35)
AST: 20 U/L (ref 0–37)
Albumin: 4.6 g/dL (ref 3.5–5.2)
Alkaline Phosphatase: 75 U/L (ref 39–117)
BUN: 20 mg/dL (ref 6–23)
CO2: 30 mEq/L (ref 19–32)
Calcium: 9.4 mg/dL (ref 8.4–10.5)
Chloride: 103 mEq/L (ref 96–112)
Creatinine, Ser: 0.75 mg/dL (ref 0.40–1.20)
GFR: 101.16 mL/min (ref >60.00)
Glucose, Bld: 104 mg/dL — ABNORMAL HIGH (ref 70–99)
Potassium: 4.2 mEq/L (ref 3.5–5.1)
Sodium: 140 mEq/L (ref 135–145)
Total Bilirubin: 0.9 mg/dL (ref 0.2–1.2)
Total Protein: 7.7 g/dL (ref 6.0–8.3)

## 2016-04-26 LAB — LIPASE: Lipase: 22 U/L (ref 11.0–59.0)

## 2016-04-26 LAB — TSH: TSH: 1.96 u[IU]/mL (ref 0.35–4.50)

## 2016-04-26 MED ORDER — OMEPRAZOLE 40 MG PO CPDR: DELAYED_RELEASE_CAPSULE | ORAL | 3 refills | Status: DC

## 2016-04-26 NOTE — Patient Instructions (Signed)
Your physician has requested that you go to the basement for lab work before leaving today.  We have sent the following prescriptions to your mail in pharmacy: omeprazole.   If you have not heard from your mail in pharmacy within 1 week or if you have not received your medication in the mail, please contact us at [phone] so we may find out why.  Thank you for choosing me and [facility].  Pricilla Riffle. Dagoberto Ligas., MD., [facility]

## 2016-04-26 NOTE — Progress Notes (Signed)
    History of Present Illness: This is a 60 year old female returning for follow-up of GERD and Crohn's colitis maintained on Humira. Her reflux symptoms are well controlled. She has no active gastrointestinal complaints. He was recently diagnosed with bronchitis being treated with a course of antibiotics. Her symptoms are improving.  Current Medications, Allergies, Past Medical History, Past Surgical History, Family History and Social History were reviewed in [employer] record.  Physical Exam: General: Well developed, well nourished, no acute distress Head: Normocephalic and atraumatic Eyes:  sclerae anicteric, EOMI Ears: Normal auditory acuity Mouth: No deformity or lesions Lungs: Clear throughout to auscultation Heart: Regular rate and rhythm; no murmurs, rubs or bruits Abdomen: Soft, non tender and non distended. No masses, hepatosplenomegaly or hernias noted. Normal Bowel sounds Musculoskeletal: Symmetrical with no gross deformities  Pulses:  Normal pulses noted Extremities: No clubbing, cyanosis, edema or deformities noted Neurological: Alert oriented x 4, grossly nonfocal Psychological:  Alert and cooperative. Normal mood and affect  Assessment and Recommendations:  1. Crohn's colitis. Asymptomatic. CMP, CBC, lipase, TSH, quant gold, HBsAg. Continue Humira and 6 MP. REV in 6 months.   2. GERD. Continue omeprazole 40 mg daily.   3. Bronchitis. Follow up with PCP.

## 2016-04-27 LAB — HEPATITIS B SURFACE ANTIGEN: Hepatitis B Surface Ag: NEGATIVE

## 2016-04-28 LAB — QUANTIFERON TB GOLD ASSAY (BLOOD)
Interferon Gamma Release Assay: NEGATIVE
Mitogen-Nil: 1.27 IU/mL
Quantiferon Nil Value: 0.01 IU/mL
Quantiferon Tb Ag Minus Nil Value: 0 IU/mL

## 2016-04-29 ENCOUNTER — Ambulatory Visit (INDEPENDENT_AMBULATORY_CARE_PROVIDER_SITE_OTHER): Admitting: Internal Medicine

## 2016-04-29 ENCOUNTER — Encounter: Payer: Self-pay | Admitting: Internal Medicine

## 2016-04-29 DIAGNOSIS — J4521 Mild intermittent asthma with (acute) exacerbation: Secondary | ICD-10-CM | POA: Diagnosis not present

## 2016-04-29 MED ORDER — HYDROCODONE-HOMATROPINE 5-1.5 MG/5ML PO SYRP: 5.0000 mL | ORAL_SOLUTION | Freq: Three times a day (TID) | ORAL | 0 refills | Status: DC | PRN

## 2016-04-29 MED ORDER — BENZONATATE 100 MG PO CAPS: 100.0000 mg | ORAL_CAPSULE | Freq: Two times a day (BID) | ORAL | 0 refills | Status: DC | PRN

## 2016-04-29 MED ORDER — PREDNISONE 20 MG PO TABS: 40.0000 mg | ORAL_TABLET | Freq: Every day | ORAL | 0 refills | Status: DC

## 2016-04-29 MED ORDER — MAGNESIUM 30 MG PO TABS: 30.0000 mg | ORAL_TABLET | Freq: Every day | ORAL | 3 refills | Status: DC

## 2016-04-29 NOTE — Progress Notes (Signed)
Pre visit review using our clinic review tool, if applicable. No additional management support is needed unless otherwise documented below in the visit note. 

## 2016-04-29 NOTE — Progress Notes (Signed)
   Subjective:    Patient ID: Brianna Santana, female    DOB: 08-15-1955, 60 y.o.   MRN: [id number]  HPI The patient is a 60 YO female coming in for cough going on for about 2 weeks. Overall is stable to worsening. Has tried some otc cold medications which have not been effective. She was seen at an urgent care and they gave her clarithromycin which she finished. They also gave her some codeine cough syrup which was not helpful. She has taken tessalon perles in the past which did help. She is having less SOB. Not using her albuterol anymore. Still using symbicort BID. No fevers or chills. Some nasal congestion still. No ear pain or drainage.   Review of Systems  Constitutional: Positive for activity change. Negative for appetite change, chills, fatigue, fever and unexpected weight change.  HENT: Positive for congestion and rhinorrhea. Negative for ear discharge, ear pain, postnasal drip, sinus pain, sinus pressure, sore throat and trouble swallowing.   Eyes: Negative.   Respiratory: Positive for cough. Negative for chest tightness, shortness of breath and wheezing.   Cardiovascular: Negative.   Gastrointestinal: Negative.   Musculoskeletal: Negative.       Objective:   Physical Exam  Constitutional: She is oriented to person, place, and time. She appears well-developed and well-nourished.  HENT:  Head: Normocephalic and atraumatic.  Right Ear: External ear normal.  Left Ear: External ear normal.  Oropharynx with redness and clear drainage.   Eyes: EOM are normal.  Neck: Normal range of motion.  Cardiovascular: Normal rate and regular rhythm.   Pulmonary/Chest: Effort normal and breath sounds normal. No respiratory distress. She has no wheezes. She has no rales. She exhibits no tenderness.  Abdominal: Soft. She exhibits no distension. There is no tenderness.  Neurological: She is alert and oriented to person, place, and time.  Skin: Skin is warm and dry.   Vitals:   04/29/16 1357    BP: 128/70  Pulse: 76  Resp: 18  Temp: 98.4 F (36.9 C)  TempSrc: Oral  SpO2: 98%  Weight: 175 lb (79.4 kg)  Height: 5' 4"  (1.626 m)      Assessment & Plan:

## 2016-04-29 NOTE — Patient Instructions (Signed)
We have sent in the tessalon perles which you can take 3 times a day as needed for the cough.   We have given you the prescription for the cough syrup which is stronger.   We have sent in prednisone. If you get worse this weekend start taking it. Take 2 pills daily for 5 days then stop.   It could take 2-3 weeks for this cough to fully clear.

## 2016-04-29 NOTE — Assessment & Plan Note (Signed)
With recent flare which is not quite over. She is taking symbicort BID and albuterol prn. Rx for tessalon perles and hycodan cough syrup. Rx for prednisone which she will fill and take only if worsening this weekend.

## 2016-05-03 ENCOUNTER — Encounter: Admitting: Internal Medicine

## 2016-05-10 ENCOUNTER — Encounter: Payer: Self-pay | Admitting: Family

## 2016-05-10 ENCOUNTER — Ambulatory Visit (INDEPENDENT_AMBULATORY_CARE_PROVIDER_SITE_OTHER): Admitting: Family

## 2016-05-10 VITALS — BP 126/80 | HR 85 | Temp 98.3°F | Resp 18 | Ht 64.0 in | Wt 182.0 lb

## 2016-05-10 DIAGNOSIS — R05 Cough: Secondary | ICD-10-CM

## 2016-05-10 DIAGNOSIS — J4521 Mild intermittent asthma with (acute) exacerbation: Secondary | ICD-10-CM | POA: Diagnosis not present

## 2016-05-10 DIAGNOSIS — R059 Cough, unspecified: Secondary | ICD-10-CM

## 2016-05-10 MED ORDER — DOXYCYCLINE HYCLATE 100 MG PO TABS: 100.0000 mg | ORAL_TABLET | Freq: Two times a day (BID) | ORAL | 0 refills | Status: DC

## 2016-05-10 MED ORDER — PROMETHAZINE-CODEINE 6.25-10 MG/5ML PO SYRP: 5.0000 mL | ORAL_SOLUTION | Freq: Four times a day (QID) | ORAL | 0 refills | Status: DC | PRN

## 2016-05-10 MED ORDER — FLUTICASONE FUROATE-VILANTEROL 200-25 MCG/INH IN AEPB: 1.0000 | INHALATION_SPRAY | Freq: Every day | RESPIRATORY_TRACT | 0 refills | Status: DC

## 2016-05-10 NOTE — Assessment & Plan Note (Addendum)
Congestion and cough that have had some improvement with clindamycin although continues to be bothersome. Sinusitis versus asthmatic bronchitis. Does not appear to be infectious and some improvement indicating gradual resolution. Written prescription for doxycycline provided with instructions for watchful waiting. Start promethazine-codeine as needed for cough and sleep. Hold Symbicort and start Breo. Continue over the counter medications as needed for symptom relief and supportive care.

## 2016-05-10 NOTE — Progress Notes (Signed)
Subjective:    Patient ID: Brianna Santana, female    DOB: 11/02/1955, 60 y.o.   MRN: [id number]  Chief Complaint  Patient presents with  . Cough    has went through 2 rounds of antibiotics for bronchitis, has had some sob which she has been using inhalers for, still has cough and congestion    HPI:  Brianna Santana is a 60 y.o. female who  has a past medical history of Allergy; Arthritis; C. difficile colitis (2011 and 07/2010); Cervical dysplasia; and Hyperplastic polyp of intestine (09/2007). and presents today for an acute office visit.  Associated symptom of cough, congestion and shortness of breath have been going on for 3 weeks and refractory to 2 rounds of clindamycin. She reports completing the last of the prednisone today. Endorses some improvements with the phelgm is clear and denies any fevers. Severity of the symptoms is enough to effect her sleep. She continues to use her Symbicort and albuterol. Having to use the albuterol 2x per day in addition to the Symbicort.    Allergies  Allergen Reactions  . Amoxicillin Itching  . Levofloxacin Other (See Comments)    Doesn't remember reaction  . Seasonal Ic [Cholestatin]   . Tramadol     Major Headaches      Outpatient Medications Prior to Visit  Medication Sig Dispense Refill  . acetaminophen-codeine (TYLENOL #3) 300-30 MG tablet Take 1 tablet by mouth every 6 (six) hours as needed for moderate pain. 30 tablet 0  . Adalimumab (HUMIRA PEN) 40 MG/0.8ML PNKT Inject 40 mg into the skin every 14 (fourteen) days. 6 each 1  . albuterol (PROAIR HFA) 108 (90 Base) MCG/ACT inhaler Inhale 4 puffs into the lungs every 4 (four) hours as needed. For shortness of breath or wheezing 1 Inhaler 1  . benzonatate (TESSALON) 100 MG capsule Take 1 capsule (100 mg total) by mouth 2 (two) times daily as needed for cough. 60 capsule 0  . betamethasone valerate ointment (VALISONE) 0.1 % Apply topically 2 (two) times daily. 30 g 0  .  budesonide-formoterol (SYMBICORT) 80-4.5 MCG/ACT inhaler Inhale 2 puffs into the lungs 2 (two) times daily. 1 Inhaler 6  . cetirizine (ZYRTEC) 10 MG tablet Take 1 tablet (10 mg total) by mouth daily. 90 tablet 2  . clobetasol cream (TEMOVATE) 6.16 % Apply 1 application topically as needed. For rash 30 g 1  . gabapentin (NEURONTIN) 300 MG capsule Take 300 mg by mouth 2 (two) times daily.     Marland Kitchen guaiFENesin (ROBITUSSIN) 100 MG/5ML SOLN Take 5 mLs by mouth every 4 (four) hours as needed for cough or to loosen phlegm.    . magnesium 30 MG tablet Take 1 tablet (30 mg total) by mouth daily. 90 tablet 3  . meloxicam (MOBIC) 15 MG tablet Take 15 mg by mouth daily.    . mercaptopurine (PURINETHOL) 50 MG tablet Take 1 1/2 tablet by mouth daily 135 tablet 1  . montelukast (SINGULAIR) 10 MG tablet Take 1 tablet (10 mg total) by mouth at bedtime. 90 tablet 3  . Multiple Vitamin (MULTIVITAMIN WITH MINERALS) TABS Take 1 tablet by mouth daily.    Marland Kitchen omeprazole (PRILOSEC) 40 MG capsule TAKE 1 CAPSULE BY MOUTH DAILY FOR HEARTBURN 90 capsule 3  . potassium chloride (K-DUR) 10 MEQ tablet Take 1 tablet (10 mEq total) by mouth daily. 90 tablet 3  . tizanidine (ZANAFLEX) 2 MG capsule Take 1 capsule (2 mg total) by mouth at bedtime. 30 capsule  6  . clarithromycin (BIAXIN) 500 MG tablet Take 500 mg by mouth 2 (two) times daily.    Marland Kitchen HYDROcodone-homatropine (HYCODAN) 5-1.5 MG/5ML syrup Take 5 mLs by mouth every 8 (eight) hours as needed for cough. 120 mL 0  . predniSONE (DELTASONE) 20 MG tablet Take 2 tablets (40 mg total) by mouth daily with breakfast. 10 tablet 0   No facility-administered medications prior to visit.     Review of Systems  Constitutional: Negative for chills and fever.  HENT: Positive for congestion and sinus pressure. Negative for sore throat.   Respiratory: Positive for cough, shortness of breath and wheezing.   Cardiovascular: Positive for chest pain.  Neurological: Negative for headaches.        Objective:    BP 126/80 (BP Location: Left Arm, Patient Position: Sitting, Cuff Size: Large)   Pulse 85   Temp 98.3 F (36.8 C) (Oral)   Resp 18   Ht 5' 4"  (1.626 m)   Wt 182 lb (82.6 kg)   SpO2 97%   BMI 31.24 kg/m  Nursing note and vital signs reviewed.  Physical Exam  Constitutional: She is oriented to person, place, and time. She appears well-developed and well-nourished.  Non-toxic appearance. She does not have a sickly appearance. She does not appear ill. No distress.  HENT:  Right Ear: Hearing, tympanic membrane, external ear and ear canal normal.  Left Ear: Hearing, tympanic membrane, external ear and ear canal normal.  Nose: Right sinus exhibits maxillary sinus tenderness. Right sinus exhibits no frontal sinus tenderness. Left sinus exhibits maxillary sinus tenderness. Left sinus exhibits no frontal sinus tenderness.  Mouth/Throat: Uvula is midline and mucous membranes are normal. Posterior oropharyngeal erythema present. No oropharyngeal exudate, posterior oropharyngeal edema or tonsillar abscesses.  Cardiovascular: Normal rate, regular rhythm, normal heart sounds and intact distal pulses.   Pulmonary/Chest: Effort normal and breath sounds normal.  Neurological: She is alert and oriented to person, place, and time.  Skin: Skin is warm and dry.  Psychiatric: She has a normal mood and affect. Her behavior is normal. Judgment and thought content normal.       Assessment & Plan:   Problem List Items Addressed This Visit      Respiratory   Asthma    Continues to appear consistent with exacerbation and contributer to cough. Hold Symbicort and start Breo. Continue albuterol as needed. Prednisone has not been as effective so will hold off at this time. Continue to monitor.         Other   Cough - Primary    Congestion and cough that have had some improvement with clindamycin although continues to be bothersome. Sinusitis versus asthmatic bronchitis. Does not appear to be  infectious and some improvement indicating gradual resolution. Written prescription for doxycycline provided with instructions for watchful waiting. Start promethazine-codeine as needed for cough and sleep. Hold Symbicort and start Breo. Continue over the counter medications as needed for symptom relief and supportive care.       Relevant Medications   promethazine-codeine (PHENERGAN WITH CODEINE) 6.25-10 MG/5ML syrup   doxycycline (VIBRA-TABS) 100 MG tablet       I have discontinued Ms. Gaynor's clarithromycin, predniSONE, and HYDROcodone-homatropine. I am also having her start on promethazine-codeine and doxycycline. Additionally, I am having her maintain her multivitamin with minerals, betamethasone valerate ointment, gabapentin, meloxicam, clobetasol cream, cetirizine, montelukast, budesonide-formoterol, albuterol, mercaptopurine, Adalimumab, tizanidine, acetaminophen-codeine, potassium chloride, guaiFENesin, omeprazole, benzonatate, and magnesium.   Meds ordered this encounter  Medications  .  promethazine-codeine (PHENERGAN WITH CODEINE) 6.25-10 MG/5ML syrup    Sig: Take 5 mLs by mouth every 6 (six) hours as needed for cough.    Dispense:  120 mL    Refill:  0    Order Specific Question:   Supervising Provider    Answer:   Pricilla Holm A [[id number]]  . doxycycline (VIBRA-TABS) 100 MG tablet    Sig: Take 1 tablet (100 mg total) by mouth 2 (two) times daily.    Dispense:  20 tablet    Refill:  0    Order Specific Question:   Supervising Provider    Answer:   Pricilla Holm A [[id number]]     Follow-up: Return if symptoms worsen or fail to improve.  Mauricio Po, FNP

## 2016-05-10 NOTE — Patient Instructions (Signed)
Thank you for choosing [facility].  SUMMARY AND INSTRUCTIONS:   Medication:  Your prescription(s) have been submitted to your pharmacy or been printed and provided for you. Please take as directed and contact our office if you believe you are having problem(s) with the medication(s) or have any questions.   Follow up:  If your symptoms worsen or fail to improve, please contact our office for further instruction, or in case of emergency go directly to the emergency room at the closest medical facility.    General Recommendations:    Please drink plenty of fluids.  Get plenty of rest   Sleep in humidified air  Use saline nasal sprays  Netti pot   OTC Medications:  Decongestants - helps relieve congestion   Flonase (generic fluticasone) or Nasacort (generic triamcinolone) - please make sure to use the "cross-over" technique at a 45 degree angle towards the opposite eye as opposed to straight up the nasal passageway.   Sudafed (generic pseudoephedrine - Note this is the one that is available behind the pharmacy counter); Products with phenylephrine (-PE) may also be used but is often not as effective as pseudoephedrine.   If you have HIGH BLOOD PRESSURE - Coricidin HBP; AVOID any product that is -D as this contains pseudoephedrine which may increase your blood pressure.  Afrin (oxymetazoline) every 6-8 hours for up to 3 days.   Allergies - helps relieve runny nose, itchy eyes and sneezing   Claritin (generic loratidine), Allegra (fexofenidine), or Zyrtec (generic cyrterizine) for runny nose. These medications should not cause drowsiness.  Note - Benadryl (generic diphenhydramine) may be used however may cause drowsiness  Cough -   Delsym or Robitussin (generic dextromethorphan)  Expectorants - helps loosen mucus to ease removal   Mucinex (generic guaifenesin) as directed on the package.  Headaches / General Aches   Tylenol (generic acetaminophen) - DO  NOT EXCEED 3 grams (3,000 mg) in a 24 hour time period  Advil/Motrin (generic ibuprofen)   Sore Throat -   Salt water gargle   Chloraseptic (generic benzocaine) spray or lozenges / Sucrets (generic dyclonine)

## 2016-05-10 NOTE — Assessment & Plan Note (Signed)
Continues to appear consistent with exacerbation and contributer to cough. Hold Symbicort and start Breo. Continue albuterol as needed. Prednisone has not been as effective so will hold off at this time. Continue to monitor.

## 2016-07-28 ENCOUNTER — Encounter: Payer: Self-pay | Admitting: Allergy & Immunology

## 2016-07-28 ENCOUNTER — Encounter (INDEPENDENT_AMBULATORY_CARE_PROVIDER_SITE_OTHER): Payer: Self-pay

## 2016-07-28 ENCOUNTER — Ambulatory Visit (INDEPENDENT_AMBULATORY_CARE_PROVIDER_SITE_OTHER): Admitting: Allergy & Immunology

## 2016-07-28 VITALS — BP 130/90 | HR 71 | Temp 98.4°F | Resp 18

## 2016-07-28 DIAGNOSIS — J019 Acute sinusitis, unspecified: Secondary | ICD-10-CM

## 2016-07-28 DIAGNOSIS — J4540 Moderate persistent asthma, uncomplicated: Secondary | ICD-10-CM | POA: Diagnosis not present

## 2016-07-28 DIAGNOSIS — J3089 Other allergic rhinitis: Secondary | ICD-10-CM

## 2016-07-28 LAB — PULMONARY FUNCTION TEST

## 2016-07-28 NOTE — Patient Instructions (Addendum)
1. Moderate persistent asthma, uncomplicated - Lung function looked great today. - Daily controller medication(s): Singulair 53m daily + Symbicort 80/4.5 two puffs twice daily with spacer OR three finger technique - Rescue medications: ProAir 4 puffs every 4-6 hours as needed - Asthma control goals:  * Full participation in all desired activities (may need albuterol before activity) * Albuterol use two time or less a week on average (not counting use with activity) * Cough interfering with sleep two time or less a month * Oral steroids no more than once a year * No hospitalizations  2. Chronic rhinitis - with acute sinusitis (likely viral) - Continue with nasal saline spray twice daily. - Start the prednisone dose pack provided in clinic today. - Call [place] your symptoms are not improving by Friday.   3. Return in about 6 months (around 01/25/2017).  Please inform [place] any Emergency Department visits, hospitalizations, or changes in symptoms. Call [place]efore going to the ED for breathing or allergy symptoms since we might be able to fit you in for a sick visit. Feel free to contact [place]time with any questions, problems, or concerns.  It was a pleasure to see you again today!   Websites that have reliable patient information: 1. American Academy of Asthma, Allergy, and Immunology: www.aaaai.org 2. Food Allergy Research and Education (FARE): foodallergy.org 3. Mothers of Asthmatics: http://www.asthmacommunitynetwork.org 4. American College of Allergy, Asthma, and Immunology: www.acaai.org

## 2016-07-28 NOTE — Progress Notes (Signed)
FOLLOW UP  Date of Service/Encounter:  07/28/16   Assessment:   Acute sinusitis, recurrence not specified, unspecified location  Moderate persistent asthma, uncomplicated  Chronic nonseasonal allergic rhinitis due to fungal spores   Asthma Reportables:  Severity: moderate persistent  Risk: low Control: not well controlled due to inhaler technique   Plan/Recommendations:   1. Moderate persistent asthma, uncomplicated - Lung function looked great today. - Daily controller medication(s): Singulair 53m daily + Symbicort 80/4.5 two puffs twice daily with spacer OR three finger technique - Rescue medications: ProAir 4 puffs every 4-6 hours as needed - Asthma control goals:  * Full participation in all desired activities (may need albuterol before activity) * Albuterol use two time or less a week on average (not counting use with activity) * Cough interfering with sleep two time or less a month * Oral steroids no more than once a year * No hospitalizations  2. Chronic rhinitis - with acute sinusitis (likely viral) - Continue with nasal saline spray twice daily. - Start the prednisone dose pack provided in clinic today. - Call [place] your symptoms are not improving by Friday.   3. Return in about 6 months (around 01/25/2017).   Subjective:   Brianna BUCHBINDERis a 61y.o. female presenting today for follow up of  Chief Complaint  Patient presents with  . Asthma  . Cough    started on sunday  . Nasal Congestion    Brianna LIZAOLAhas a history of the following: Patient Active Problem List   Diagnosis Date Noted  . Cough 05/10/2016  . Acute left-sided thoracic back pain 04/05/2016  . Routine general medical examination Brianna a health care facility 05/02/2015  . Thyroid mass 01/16/2015  . Drug allergy 07/30/2014  . [place]'s [place] 03/18/2014  . Osteopenia 09/11/2013  . Hyperthyroidism   . GERD 11/02/2007  . Asthma 10/31/2007    History obtained from: chart  review and patient.  Brianna Cohowas referred by Brianna Koch MD.     DAshantyis a 61y.o. female presenting for a follow up visit. I last saw her in September 2017. Brianna that time, I continued her on Symbicort 80 2 puffs morning and 2009 as well as Singulair. Her last testing was performed in January 2013 and was positive to dust mites, cockroach, and equivocal to a couple of molds.  Since last visit, However, since Saturday, she has had coughing, rhinorrhea, eye pressure, and sore throat. Overall, she has gotten progressively better. However, her cough continues. She has had no fever. She denies sinus pressure Brianna this time. She remains on her nasal saline which she is using religiously as of late.   Brianna Santana's asthma has been well controlled. She has not required rescue medication, experienced nocturnal awakenings due to lower respiratory symptoms, nor have activities of daily living been limited. However, she did have a prolonged episode of bronchitis in November [place] time, she was placed on clarithromycin as well as a prednisone burst. When she came home, she saw her primary care doctor who put her on another course of prednisone. She remains on Symbicort, but does not use a spacer or the 3 finger technique because she "feel[s] like [she] gets in in better". She has been on  Breo in the past, but it was more expensive with her insurance plan ([place]  [name] have a history  of Crohn's disease which is well controlled. Otherwise, there have been no changes to her past medical history, surgical history, family history, or social history. 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     Review of Systems: a 14-point review of systems is pertinent for what is mentioned in HPI.  Otherwise, all other systems were negative. Constitutional: negative other than that listed in the HPI Eyes:  negative other than that listed in the HPI Ears, nose, mouth, throat, and face: negative other than that listed in the HPI Respiratory: negative other than that listed in the HPI Cardiovascular: negative other than that listed in the HPI Gastrointestinal: negative other than that listed in the HPI Genitourinary: negative other than that listed in the HPI Integument: negative other than that listed in the HPI Hematologic: negative other than that listed in the HPI Musculoskeletal: negative other than that listed in the HPI Neurological: negative other than that listed in the HPI Allergy/Immunologic: negative other than that listed in the HPI    Objective:   Blood pressure 130/90, pulse 71, temperature 98.4 F (36.9 C), resp. rate 18, SpO2 99 %. There is no height or weight on file to calculate BMI.   Physical Exam:  General: Alert, interactive, in no acute distress. Pleasant.  Eyes: No conjunctival injection present on the right, No conjunctival injection present on the left, PERRL bilaterally, No discharge on the right, No discharge on the left and No Horner-Trantas dots present Ears: Right TM pearly gray with normal light reflex, Left TM pearly gray with normal light reflex, Right TM intact without perforation and Left TM intact without perforation.  Nose/Throat: External nose within normal limits and septum midline, turbinates edematous without discharge, post-pharynx moderately erythematous with cobblestoning in the posterior oropharynx. Tonsils 2+ without exudates Neck: Supple without thyromegaly. Lungs: Clear to auscultation without wheezing, rhonchi or rales. No increased work of breathing. CV: Normal S1/S2, no murmurs. Capillary refill <2 seconds.  Skin: Warm and dry, without lesions or rashes. Neuro:   Grossly intact. No focal deficits appreciated. Responsive to questions.   Diagnostic studies:  Spirometry: results normal (FEV1: 2.21/104%, FVC: 2.77/103%, FEV1/FVC: 80%).     Spirometry consistent with normal pattern.   Allergy Studies: None   Salvatore Marvel, MD [facility]

## 2016-08-12 ENCOUNTER — Encounter: Payer: Self-pay | Admitting: Allergy & Immunology

## 2016-08-18 ENCOUNTER — Ambulatory Visit: Admitting: Allergy & Immunology

## 2016-08-31 ENCOUNTER — Telehealth: Payer: Self-pay | Admitting: Gastroenterology

## 2016-09-01 MED ORDER — ADALIMUMAB 40 MG/0.8ML ~~LOC~~ AJKT: 40.0000 mg | AUTO-INJECTOR | SUBCUTANEOUS | 2 refills | Status: DC

## 2016-09-01 NOTE — Telephone Encounter (Signed)
rx sent Patient notified

## 2016-09-15 ENCOUNTER — Ambulatory Visit (INDEPENDENT_AMBULATORY_CARE_PROVIDER_SITE_OTHER): Admitting: Women's Health

## 2016-09-15 ENCOUNTER — Encounter: Payer: Self-pay | Admitting: Women's Health

## 2016-09-15 VITALS — BP 122/76 | Ht 64.0 in | Wt 181.0 lb

## 2016-09-15 DIAGNOSIS — Z01419 Encounter for gynecological examination (general) (routine) without abnormal findings: Secondary | ICD-10-CM | POA: Diagnosis not present

## 2016-09-15 DIAGNOSIS — Z13820 Encounter for screening for osteoporosis: Secondary | ICD-10-CM

## 2016-09-15 NOTE — Progress Notes (Signed)
Brianna Santana [birth year] [id number]    History:    Presents for annual exam.  2002 [facility] for fibroids. Normal Pap and mammogram history. 08/2013 colonoscopy with benign polyp 5 year follow-up. Crohn's disease and rheumatoid arthritis long-term history of steroid use. 09/2014 DEXA T score -2.2 at spine back 7.3%/0.8%. Has had Zostavax.  Past medical history, past surgical history, family history and social history were all reviewed and documented in the EPIC chart. Does Ministry work. 3 children all doing well. Husband prostate cancer, radiation treatment.  ROS:  A ROS was performed and pertinent positives and negatives are included.  Exam:  Vitals:   09/15/16 1101  BP: 122/76  Weight: 181 lb (82.1 kg)  Height: 5' 4"  (1.626 m)   Body mass index is 31.07 kg/m.   General appearance:  Normal Thyroid:  Symmetrical,Enlarged history of nodules without palpable masses or nodularity. Respiratory  Auscultation:  Clear without wheezing or rhonchi Cardiovascular  Auscultation:  Regular rate, without rubs, murmurs or gallops  Edema/varicosities:  Not grossly evident Abdominal  Soft,nontender, without masses, guarding or rebound.  Liver/spleen:  No organomegaly noted  Hernia:  None appreciated  Skin  Inspection:  Grossly normal   Breasts: Examined lying and sitting.     Right: Without masses, retractions, discharge or axillary adenopathy.     Left: Without masses, retractions, discharge or axillary adenopathy. Gentitourinary   Inguinal/mons:  Normal without inguinal adenopathy  External genitalia:  Normal  BUS/Urethra/Skene's glands:  Normal  Vagina:  Atrophic  Cervix:  And uterus absent  Adnexa/parametria:     Rt: Without masses or tenderness.   Lt: Without masses or tenderness.  Anus and perineum: Normal  Digital rectal exam: Normal sphincter tone without palpated masses or tenderness  Assessment/Plan:  61 y.o. MBF G3 P3  for annual exam with no complaints.  2002 TVH for  fibroids no HRT Osteopenia without elevated FRAX Colon polyps 5. Follow-up 2020 Crohn's-GI manages RA rheumatologist  labs at primary care Benign thyroid nodules/hyperthyroid-endocrinologist manages  Plan: Repeat DEXA. Home safety, fall prevention and importance of weightbearing exercise, is doing water aerobics and walking. SBE's, continue annual screening mammogram, calcium rich diet, vitamin D 2000 daily encouraged. Normal Pap history, new screening guidelines reviewed.    [place], 11:35 AM 09/15/2016

## 2016-09-15 NOTE — Patient Instructions (Signed)
Health Maintenance for Postmenopausal Women Menopause is a normal process in which your reproductive ability comes to an end. This process happens gradually over a span of months to years, usually between the ages of 33 and 38. Menopause is complete when you have missed 12 consecutive menstrual periods. It is important to talk with your health care provider about some of the most common conditions that affect postmenopausal women, such as heart disease, cancer, and bone loss (osteoporosis). Adopting a healthy lifestyle and getting preventive care can help to promote your health and wellness. Those actions can also lower your chances of developing some of these common conditions. What should I know about menopause? During menopause, you may experience a number of symptoms, such as:  Moderate-to-severe hot flashes.  Night sweats.  Decrease in sex drive.  Mood swings.  Headaches.  Tiredness.  Irritability.  Memory problems.  Insomnia. Choosing to treat or not to treat menopausal changes is an individual decision that you make with your health care provider. What should I know about hormone replacement therapy and supplements? Hormone therapy products are effective for treating symptoms that are associated with menopause, such as hot flashes and night sweats. Hormone replacement carries certain risks, especially as you become older. If you are thinking about using estrogen or estrogen with progestin treatments, discuss the benefits and risks with your health care provider. What should I know about heart disease and stroke? Heart disease, heart attack, and stroke become more likely as you age. This may be due, in part, to the hormonal changes that your body experiences during menopause. These can affect how your body processes dietary fats, triglycerides, and cholesterol. Heart attack and stroke are both medical emergencies. There are many things that you can do to help prevent heart disease  and stroke:  Have your blood pressure checked at least every 1-2 years. High blood pressure causes heart disease and increases the risk of stroke.  If you are 48-61 years old, ask your health care provider if you should take aspirin to prevent a heart attack or a stroke.  Do not use any tobacco products, including cigarettes, chewing tobacco, or electronic cigarettes. If you need help quitting, ask your health care provider.  It is important to eat a healthy diet and maintain a healthy weight.  Be sure to include plenty of vegetables, fruits, low-fat dairy products, and lean protein.  Avoid eating foods that are high in solid fats, added sugars, or salt (sodium).  Get regular exercise. This is one of the most important things that you can do for your health.  Try to exercise for at least 150 minutes each week. The type of exercise that you do should increase your heart rate and make you sweat. This is known as moderate-intensity exercise.  Try to do strengthening exercises at least twice each week. Do these in addition to the moderate-intensity exercise.  Know your numbers.Ask your health care provider to check your cholesterol and your blood glucose. Continue to have your blood tested as directed by your health care provider. What should I know about cancer screening? There are several types of cancer. Take the following steps to reduce your risk and to catch any cancer development as early as possible. Breast Cancer  Practice breast self-awareness.  This means understanding how your breasts normally appear and feel.  It also means doing regular breast self-exams. Let your health care provider know about any changes, no matter how small.  If you are 40 or older,  have a clinician do a breast exam (clinical breast exam or CBE) every year. Depending on your age, family history, and medical history, it may be recommended that you also have a yearly breast X-ray (mammogram).  If you  have a family history of breast cancer, talk with your health care provider about genetic screening.  If you are at high risk for breast cancer, talk with your health care provider about having an MRI and a mammogram every year.  Breast cancer (BRCA) gene test is recommended for women who have family members with BRCA-related cancers. Results of the assessment will determine the need for genetic counseling and BRCA1 and for BRCA2 testing. BRCA-related cancers include these types:  Breast. This occurs in males or females.  Ovarian.  Tubal. This may also be called fallopian tube cancer.  Cancer of the abdominal or pelvic lining (peritoneal cancer).  Prostate.  Pancreatic. Cervical, Uterine, and Ovarian Cancer  Your health care provider may recommend that you be screened regularly for cancer of the pelvic organs. These include your ovaries, uterus, and vagina. This screening involves a pelvic exam, which includes checking for microscopic changes to the surface of your cervix (Pap test).  For women ages 21-65, health care providers may recommend a pelvic exam and a Pap test every three years. For women ages 23-65, they may recommend the Pap test and pelvic exam, combined with testing for human papilloma virus (HPV), every five years. Some types of HPV increase your risk of cervical cancer. Testing for HPV may also be done on women of any age who have unclear Pap test results.  Other health care providers may not recommend any screening for nonpregnant women who are considered low risk for pelvic cancer and have no symptoms. Ask your health care provider if a screening pelvic exam is right for you.  If you have had past treatment for cervical cancer or a condition that could lead to cancer, you need Pap tests and screening for cancer for at least 20 years after your treatment. If Pap tests have been discontinued for you, your risk factors (such as having a new sexual partner) need to be reassessed  to determine if you should start having screenings again. Some women have medical problems that increase the chance of getting cervical cancer. In these cases, your health care provider may recommend that you have screening and Pap tests more often.  If you have a family history of uterine cancer or ovarian cancer, talk with your health care provider about genetic screening.  If you have vaginal bleeding after reaching menopause, tell your health care provider.  There are currently no reliable tests available to screen for ovarian cancer. Lung Cancer  Lung cancer screening is recommended for adults 99-83 years old who are at high risk for lung cancer because of a history of smoking. A yearly low-dose CT scan of the lungs is recommended if you:  Currently smoke.  Have a history of at least 30 pack-years of smoking and you currently smoke or have quit within the past 15 years. A pack-year is smoking an average of one pack of cigarettes per day for one year. Yearly screening should:  Continue until it has been 15 years since you quit.  Stop if you develop a health problem that would prevent you from having lung cancer treatment. Colorectal Cancer  This type of cancer can be detected and can often be prevented.  Routine colorectal cancer screening usually begins at age 72 and continues  through age 75.  If you have risk factors for colon cancer, your health care provider may recommend that you be screened at an earlier age.  If you have a family history of colorectal cancer, talk with your health care provider about genetic screening.  Your health care provider may also recommend using home test kits to check for hidden blood in your stool.  A small camera at the end of a tube can be used to examine your colon directly (sigmoidoscopy or colonoscopy). This is done to check for the earliest forms of colorectal cancer.  Direct examination of the colon should be repeated every 5-10 years until  age 75. However, if early forms of precancerous polyps or small growths are found or if you have a family history or genetic risk for colorectal cancer, you may need to be screened more often. Skin Cancer  Check your skin from head to toe regularly.  Monitor any moles. Be sure to tell your health care provider:  About any new moles or changes in moles, especially if there is a change in a mole's shape or color.  If you have a mole that is larger than the size of a pencil eraser.  If any of your family members has a history of skin cancer, especially at a Faiza Bansal age, talk with your health care provider about genetic screening.  Always use sunscreen. Apply sunscreen liberally and repeatedly throughout the day.  Whenever you are outside, protect yourself by wearing long sleeves, pants, a wide-brimmed hat, and sunglasses. What should I know about osteoporosis? Osteoporosis is a condition in which bone destruction happens more quickly than new bone creation. After menopause, you may be at an increased risk for osteoporosis. To help prevent osteoporosis or the bone fractures that can happen because of osteoporosis, the following is recommended:  If you are 19-50 years old, get at least 1,000 mg of calcium and at least 600 mg of vitamin D per day.  If you are older than age 50 but younger than age 70, get at least 1,200 mg of calcium and at least 600 mg of vitamin D per day.  If you are older than age 70, get at least 1,200 mg of calcium and at least 800 mg of vitamin D per day. Smoking and excessive alcohol intake increase the risk of osteoporosis. Eat foods that are rich in calcium and vitamin D, and do weight-bearing exercises several times each week as directed by your health care provider. What should I know about how menopause affects my mental health? Depression may occur at any age, but it is more common as you become older. Common symptoms of depression include:  Low or sad  mood.  Changes in sleep patterns.  Changes in appetite or eating patterns.  Feeling an overall lack of motivation or enjoyment of activities that you previously enjoyed.  Frequent crying spells. Talk with your health care provider if you think that you are experiencing depression. What should I know about immunizations? It is important that you get and maintain your immunizations. These include:  Tetanus, diphtheria, and pertussis (Tdap) booster vaccine.  Influenza every year before the flu season begins.  Pneumonia vaccine.  Shingles vaccine. Your health care provider may also recommend other immunizations. This information is not intended to replace advice given to you by your health care provider. Make sure you discuss any questions you have with your health care provider. Document Released: 07/09/2005 Document Revised: 12/05/2015 Document Reviewed: 02/18/2015 Elsevier Interactive Patient   Education  2017 [employer].

## 2016-09-16 ENCOUNTER — Encounter: Admitting: Women's Health

## 2016-09-16 LAB — URINALYSIS W MICROSCOPIC + REFLEX CULTURE
Bacteria, UA: NONE SEEN [HPF]
Bilirubin Urine: NEGATIVE
Casts: NONE SEEN [LPF]
Crystals: NONE SEEN [HPF]
Glucose, UA: NEGATIVE
Hgb urine dipstick: NEGATIVE
Ketones, ur: NEGATIVE
Leukocytes, UA: NEGATIVE
Nitrite: NEGATIVE
Protein, ur: NEGATIVE
RBC / HPF: NONE SEEN RBC/HPF (ref <=2)
Specific Gravity, Urine: 1.019 (ref 1.001–1.035)
Squamous Epithelial / LPF: NONE SEEN [HPF] (ref <=5)
WBC, UA: NONE SEEN WBC/HPF (ref <=5)
Yeast: NONE SEEN [HPF]
pH: 5.5 (ref 5.0–8.0)

## 2016-09-20 ENCOUNTER — Encounter: Admitting: Women's Health

## 2016-10-05 ENCOUNTER — Telehealth: Payer: Self-pay | Admitting: Gastroenterology

## 2016-10-05 MED ORDER — MERCAPTOPURINE 50 MG PO TABS: ORAL_TABLET | ORAL | 0 refills | Status: DC

## 2016-10-05 NOTE — Telephone Encounter (Signed)
Informed patient that she is due for 6 month f/u visit but I will go ahead and send her medication to the pharmacy until appt. Appt scheduled for 11/22/16 10:15am.

## 2016-10-05 NOTE — Telephone Encounter (Signed)
Left a message for patient to return my call. 

## 2016-10-28 ENCOUNTER — Other Ambulatory Visit: Payer: Self-pay | Admitting: Gynecology

## 2016-10-28 DIAGNOSIS — Z13820 Encounter for screening for osteoporosis: Secondary | ICD-10-CM

## 2016-10-29 DIAGNOSIS — M8580 Other specified disorders of bone density and structure, unspecified site: Secondary | ICD-10-CM

## 2016-10-29 HISTORY — DX: Other specified disorders of bone density and structure, unspecified site: M85.80

## 2016-11-04 ENCOUNTER — Ambulatory Visit (INDEPENDENT_AMBULATORY_CARE_PROVIDER_SITE_OTHER)

## 2016-11-04 DIAGNOSIS — M8589 Other specified disorders of bone density and structure, multiple sites: Secondary | ICD-10-CM

## 2016-11-04 DIAGNOSIS — Z13820 Encounter for screening for osteoporosis: Secondary | ICD-10-CM

## 2016-11-05 ENCOUNTER — Encounter: Payer: Self-pay | Admitting: Gynecology

## 2016-11-05 ENCOUNTER — Telehealth: Payer: Self-pay | Admitting: Gynecology

## 2016-11-05 DIAGNOSIS — Z1321 Encounter for screening for nutritional disorder: Secondary | ICD-10-CM

## 2016-11-05 NOTE — Telephone Encounter (Signed)
Tell patient her bone density almost hits osteoporosis. Her value was 2.4 where 2.5 is osteoporosis. Her calculated fracture risk is low and I do not think we need to start medicine at this time. I would recommend that she have a vitamin D level checked either here or through her primary physician's office. If it is low then supplementing vitamin D will certainly help. Weight-bearing exercise like walking on a regular basis will also help. Recommend follow up bone density in 2 years

## 2016-11-08 ENCOUNTER — Other Ambulatory Visit: Payer: Self-pay | Admitting: Gynecology

## 2016-11-08 DIAGNOSIS — M8589 Other specified disorders of bone density and structure, multiple sites: Secondary | ICD-10-CM

## 2016-11-08 DIAGNOSIS — Z13820 Encounter for screening for osteoporosis: Secondary | ICD-10-CM

## 2016-11-09 NOTE — Telephone Encounter (Signed)
Left message for pt to call.

## 2016-11-09 NOTE — Telephone Encounter (Signed)
Pt informed will come tomorrow for vitamin d level.

## 2016-11-10 ENCOUNTER — Encounter: Payer: Self-pay | Admitting: Podiatry

## 2016-11-10 ENCOUNTER — Other Ambulatory Visit

## 2016-11-10 ENCOUNTER — Ambulatory Visit (INDEPENDENT_AMBULATORY_CARE_PROVIDER_SITE_OTHER): Admitting: Podiatry

## 2016-11-10 VITALS — BP 131/77 | HR 70

## 2016-11-10 DIAGNOSIS — S90221A Contusion of right lesser toe(s) with damage to nail, initial encounter: Secondary | ICD-10-CM

## 2016-11-10 DIAGNOSIS — Z1321 Encounter for screening for nutritional disorder: Secondary | ICD-10-CM

## 2016-11-10 DIAGNOSIS — S90222A Contusion of left lesser toe(s) with damage to nail, initial encounter: Secondary | ICD-10-CM

## 2016-11-10 NOTE — Patient Instructions (Signed)
There is evidence of dried blood beneath the second toenails on right and left feet after a pedicure occurring on March 2018. No active treatment is needed at this time. Allow the nails to regrow if you notice any drainage, or loosening of the nail percent for further evaluation. The time to regrow nail may be between 6-9 months. After the nail regrows there is any persistence of color changes beneath the nail percent for further evaluation. Okay to probably nail polish over the nails

## 2016-11-10 NOTE — Progress Notes (Signed)
   Subjective:    Patient ID: Katrine Coho, female    DOB: 12-Jun-1955, 61 y.o.   MRN: [id number]  HPI This patient presents today complaining of color change in her second toenails that occurred after a pedicure on 08/21/2016. She noticed toenails changed to evaluate darkened black color and had some locals swelling around the nails. Patient soaked in Epsom salts and put topical medication and the swelling has resolved, however, the color change has persisted. At this time she denies any discomfort in the nails   Review of Systems  Skin: Positive for color change.       Objective:   Physical Exam     Orientated 3  Vascular: No peripheral edema bilaterally DP and PT pulses 2/4 bilaterally Capillary reflex immediate bilaterally  Neurological: Sensation to 10 g monofilament wire intact 5/5 bilaterally Vibratory sensation reactive bilaterally Ankle reflex equal and reactive bilaterally  Dermatological: No open skin lesions bilaterally The second toenails, bilaterally have mild dystrophic changes in the distal third of the nail plate with darkened debris from the distal nail bed to the proximal nail fold. There is no mobility in the nail plates The fifth toenails have mild dystrophic changes   .Musculoskeletal: HAV bilaterally Overlapping second toes bilaterally Varus rotation fifth toes bilaterally Manual motor testing dorsi flexion, plantar flexion, inversion, eversion 5/5 bilaterally There is no restriction ankle, subtalar, midtarsal joints bilaterally       Assessment & Plan:   Assessment: Satisfactory neurovascular status Subungual hematoma second toes bilaterally  Plan: Reviewed the results exam with patient today informed that she had subungual hematomas that needed no active treatment. Informed that they eventually the placement nail should appear to have a relatively normal color without darkened debris beneath it and the next 6-9 months. If the nail  continues to be uncomfortable, color change present for further evaluation

## 2016-11-11 LAB — VITAMIN D 25 HYDROXY (VIT D DEFICIENCY, FRACTURES): Vit D, 25-Hydroxy: 39 ng/mL (ref 30–100)

## 2016-11-22 ENCOUNTER — Other Ambulatory Visit (INDEPENDENT_AMBULATORY_CARE_PROVIDER_SITE_OTHER)

## 2016-11-22 ENCOUNTER — Other Ambulatory Visit: Payer: Self-pay

## 2016-11-22 ENCOUNTER — Ambulatory Visit (INDEPENDENT_AMBULATORY_CARE_PROVIDER_SITE_OTHER): Admitting: Gastroenterology

## 2016-11-22 ENCOUNTER — Encounter: Payer: Self-pay | Admitting: Gastroenterology

## 2016-11-22 VITALS — BP 118/64 | HR 84 | Ht 64.0 in | Wt 179.1 lb

## 2016-11-22 DIAGNOSIS — R945 Abnormal results of liver function studies: Principal | ICD-10-CM

## 2016-11-22 DIAGNOSIS — K219 Gastro-esophageal reflux disease without esophagitis: Secondary | ICD-10-CM | POA: Diagnosis not present

## 2016-11-22 DIAGNOSIS — K5010 Crohn's disease of large intestine without complications: Secondary | ICD-10-CM | POA: Diagnosis not present

## 2016-11-22 DIAGNOSIS — R7989 Other specified abnormal findings of blood chemistry: Secondary | ICD-10-CM

## 2016-11-22 LAB — CBC WITH DIFFERENTIAL/PLATELET
Basophils Absolute: 0.1 10*3/uL (ref 0.0–0.1)
Basophils Relative: 2 % (ref 0.0–3.0)
Eosinophils Absolute: 0.2 10*3/uL (ref 0.0–0.7)
Eosinophils Relative: 6.5 % — ABNORMAL HIGH (ref 0.0–5.0)
HCT: 38.7 % (ref 36.0–46.0)
Hemoglobin: 13.2 g/dL (ref 12.0–15.0)
Lymphocytes Relative: 34.9 % (ref 12.0–46.0)
Lymphs Abs: 1 10*3/uL (ref 0.7–4.0)
MCHC: 34.1 g/dL (ref 30.0–36.0)
MCV: 95.2 fl (ref 78.0–100.0)
Monocytes Absolute: 0.3 10*3/uL (ref 0.1–1.0)
Monocytes Relative: 11.2 % (ref 3.0–12.0)
Neutro Abs: 1.3 10*3/uL — ABNORMAL LOW (ref 1.4–7.7)
Neutrophils Relative %: 45.4 % (ref 43.0–77.0)
Platelets: 248 10*3/uL (ref 150.0–400.0)
RBC: 4.06 Mil/uL (ref 3.87–5.11)
RDW: 13.7 % (ref 11.5–15.5)
WBC: 2.8 10*3/uL — ABNORMAL LOW (ref 4.0–10.5)

## 2016-11-22 LAB — COMPREHENSIVE METABOLIC PANEL
ALT: 46 U/L — ABNORMAL HIGH (ref 0–35)
AST: 38 U/L — ABNORMAL HIGH (ref 0–37)
Albumin: 4.6 g/dL (ref 3.5–5.2)
Alkaline Phosphatase: 80 U/L (ref 39–117)
BUN: 16 mg/dL (ref 6–23)
CO2: 30 mEq/L (ref 19–32)
Calcium: 10.2 mg/dL (ref 8.4–10.5)
Chloride: 105 mEq/L (ref 96–112)
Creatinine, Ser: 0.77 mg/dL (ref 0.40–1.20)
GFR: 97.95 mL/min (ref >60.00)
Glucose, Bld: 96 mg/dL (ref 70–99)
Potassium: 4.3 mEq/L (ref 3.5–5.1)
Sodium: 142 mEq/L (ref 135–145)
Total Bilirubin: 1.1 mg/dL (ref 0.2–1.2)
Total Protein: 7.4 g/dL (ref 6.0–8.3)

## 2016-11-22 LAB — TSH: TSH: 2.01 u[IU]/mL (ref 0.35–4.50)

## 2016-11-22 MED ORDER — ADALIMUMAB 40 MG/0.8ML ~~LOC~~ AJKT: 40.0000 mg | AUTO-INJECTOR | SUBCUTANEOUS | 1 refills | Status: DC

## 2016-11-22 MED ORDER — OMEPRAZOLE 40 MG PO CPDR: DELAYED_RELEASE_CAPSULE | ORAL | 3 refills | Status: DC

## 2016-11-22 NOTE — Patient Instructions (Signed)
Your physician has requested that you go to the basement for lab work before leaving today.   Stop taking mercaptopurine.   We have sent the following prescriptions to your mail in pharmacy: omeprazole and Humira.   If you have not heard from your mail in pharmacy within 1 week or if you have not received your medication in the mail, please contact us at [phone] so we may find out why.  Thank you for choosing me and [facility].  Pricilla Riffle. Dagoberto Ligas., MD., [facility]

## 2016-11-22 NOTE — Progress Notes (Signed)
    History of Present Illness: This is a 61 year old female with Crohn's colitis and GERD. She has no gastrointestinal complaints and feels well. She occasionally has some breakthrough acid reflux symptoms but the symptoms are very infrequent.  Current Medications, Allergies, Past Medical History, Past Surgical History, Family History and Social History were reviewed in [employer] record.  Physical Exam: General: Well developed, well nourished, no acute distress Head: Normocephalic and atraumatic Eyes:  sclerae anicteric, EOMI Ears: Normal auditory acuity Mouth: No deformity or lesions Lungs: Clear throughout to auscultation Heart: Regular rate and rhythm; no murmurs, rubs or bruits Abdomen: Soft, non tender and non distended. No masses, hepatosplenomegaly or hernias noted. Normal Bowel sounds Musculoskeletal: Symmetrical with no gross deformities  Pulses:  Normal pulses noted Extremities: No clubbing, cyanosis, edema or deformities noted Neurological: Alert oriented x 4, grossly nonfocal Psychological:  Alert and cooperative. Normal mood and affect  Assessment and Recommendations:  1. Crohn's colitis, asymptomatic. Currently maintained on Humira and 6-MP. We discussed again today, as we have previously, discontinuing 6-MP. She has been very reluctant to do so since her Crohn's colitis has been so well controlled. We discussed the risks and benefits of taking 6-MP along with a biologic. I advised her to discontinue 6-MP and she agrees. CMP, CBC, TSH today. Continue Humira 40 mg sq every 14 days. REV in 6 months.  2. GERD. Continue standard antireflux measures and omeprazole 40 mg daily.  I spent 15 minutes of face-to-face time with the patient. Greater than 50% of the time was spent counseling and coordinating care.

## 2016-12-20 ENCOUNTER — Encounter: Payer: Self-pay | Admitting: Allergy and Immunology

## 2016-12-20 ENCOUNTER — Ambulatory Visit (INDEPENDENT_AMBULATORY_CARE_PROVIDER_SITE_OTHER): Admitting: Allergy and Immunology

## 2016-12-20 VITALS — BP 118/82 | HR 88 | Resp 16

## 2016-12-20 DIAGNOSIS — J4540 Moderate persistent asthma, uncomplicated: Secondary | ICD-10-CM

## 2016-12-20 DIAGNOSIS — J019 Acute sinusitis, unspecified: Secondary | ICD-10-CM | POA: Insufficient documentation

## 2016-12-20 DIAGNOSIS — J3089 Other allergic rhinitis: Secondary | ICD-10-CM

## 2016-12-20 DIAGNOSIS — J0100 Acute maxillary sinusitis, unspecified: Secondary | ICD-10-CM | POA: Diagnosis not present

## 2016-12-20 MED ORDER — PREDNISONE 1 MG PO TABS: 10.0000 mg | ORAL_TABLET | Freq: Every day | ORAL | Status: AC

## 2016-12-20 MED ORDER — AZELASTINE HCL 0.1 % NA SOLN: NASAL | 5 refills | Status: DC

## 2016-12-20 NOTE — Progress Notes (Signed)
Follow-up Note  RE: Brianna Santana MRN: [id number] DOB: Mar 06, 1956 Date of Office Visit: 12/20/2016  Primary care provider: Hoyt Koch, MD Referring provider: Hoyt Koch, *  History of present illness: Brianna Santana is a 61 y.o. female with persistent asthma and allergic rhinitis presenting today for sick visit.  She was last seen in this clinic on 07/28/2016.  Over this past 2 weeks she has been experiencing severe persistent nasal congestion, sinus pressure over the cheekbones and forehead, and thick postnasal drainage.  She she has only been using nasal saline and montelukast as means of controlling her nasal symptoms.  She reports that she may have experienced a brief low-grade fever, however is uncertain.  She does not complain of discolored mucus.  Her asthma has been relatively well-controlled with Symbicort 80/4.5 g, 2 inhalations via spacer device twice a day, and montelukast 10 mg daily bedtime.   Assessment and plan: Acute sinusitis  Prednisone has been provided, 20 mg x 4 days, 10 mg x1 day, then stop.  A prescription has been provided for azelastine nasal spray, 1-2 sprays per nostril 2 times daily as needed. Proper nasal spray technique has been discussed and demonstrated.   Nasal saline lavage (NeilMed) has been recommended as needed and prior to medicated nasal sprays along with instructions for proper administration.  For thick post nasal drainage, add guaifenesin [phone] mg (Mucinex)  twice daily as needed with adequate hydration as discussed.  The patient has been asked to contact me if her symptoms persist, progress, or if she becomes febrile.   Moderate persistent asthma Stable.  Continue Symbicort 80/4.5 g, 2 inhalations via spacer device twice a day, montelukast 10 mg daily bedtime, and albuterol HFA, 1-2 inhalations every 4-6 hours as needed.  Subjective and objective measures of pulmonary function will be followed and the treatment  plan will be adjusted accordingly.  Other allergic rhinitis  Continue appropriate allergen avoidance measures and montelukast 10 mg daily at bedtime.  Azelastine nasal spray and nasal saline lavage have been recommended (as above).   Meds ordered this encounter  Medications  . azelastine (ASTELIN) 0.1 % nasal spray    Sig: Spray 1-2 sprays per nostril twice daily    Dispense:  30 mL    Refill:  5  . predniSONE (DELTASONE) tablet 10 mg    Diagnostics: Spirometry:  Normal with an FEV1 of 95% predicted.  Please see scanned spirometry results for details.    Physical examination: Blood pressure 118/82, pulse 88, resp. rate 16.  General: Alert, interactive, in no acute distress. HEENT: TMs pearly gray, turbinates edematous with thick discharge, post-pharynx erythematous. Neck: Supple without lymphadenopathy. Lungs: Clear to auscultation without wheezing, rhonchi or rales. CV: Normal S1, S2 without murmurs. Skin: Warm and dry, without lesions or rashes.  The following portions of the patient's history were reviewed and updated as appropriate: allergies, current medications, past family history, past medical history, past social history, past surgical history and problem list.  Allergies as of 12/20/2016      Reactions   Amoxicillin Itching   Levofloxacin Other (See Comments)   Doesn't remember reaction   Seasonal Ic [cholestatin]    Tramadol    Major Headaches      Medication List       Accurate as of 12/20/16 10:17 PM. Always use your most recent med list.          acetaminophen-codeine 300-30 MG tablet Commonly known as:  TYLENOL #3 Take 1  tablet by mouth every 6 (six) hours as needed for moderate pain.   Adalimumab 40 MG/0.8ML Pnkt Commonly known as:  HUMIRA PEN Inject 40 mg into the skin every 14 (fourteen) days.   albuterol 108 (90 Base) MCG/ACT inhaler Commonly known as:  PROAIR HFA Inhale 4 puffs into the lungs every 4 (four) hours as needed. For shortness  of breath or wheezing   azelastine 0.1 % nasal spray Commonly known as:  ASTELIN Spray 1-2 sprays per nostril twice daily   budesonide-formoterol 80-4.5 MCG/ACT inhaler Commonly known as:  SYMBICORT Inhale 2 puffs into the lungs 2 (two) times daily.   cetirizine 10 MG tablet Commonly known as:  ZYRTEC Take 1 tablet (10 mg total) by mouth daily.   clobetasol cream 0.05 % Commonly known as:  TEMOVATE Apply 1 application topically as needed. For rash   gabapentin 300 MG capsule Commonly known as:  NEURONTIN Take 300 mg by mouth 2 (two) times daily.   guaiFENesin 100 MG/5ML Soln Commonly known as:  ROBITUSSIN Take 5 mLs by mouth every 4 (four) hours as needed for cough or to loosen phlegm.   magnesium 30 MG tablet Take 1 tablet (30 mg total) by mouth daily.   meloxicam 15 MG tablet Commonly known as:  MOBIC Take 15 mg by mouth daily.   montelukast 10 MG tablet Commonly known as:  SINGULAIR Take 1 tablet (10 mg total) by mouth at bedtime.   multivitamin with minerals Tabs tablet Take 1 tablet by mouth daily.   omeprazole 40 MG capsule Commonly known as:  PRILOSEC TAKE 1 CAPSULE BY MOUTH DAILY FOR HEARTBURN   potassium chloride 10 MEQ tablet Commonly known as:  K-DUR Take 1 tablet (10 mEq total) by mouth daily.   tiZANidine 4 MG capsule Commonly known as:  ZANAFLEX Take 4 mg by mouth 3 (three) times daily.   Vitamin D3 2000 units Tabs Take 2 tablets by mouth daily.       Allergies  Allergen Reactions  . Amoxicillin Itching  . Levofloxacin Other (See Comments)    Doesn't remember reaction  . Seasonal Ic [Cholestatin]   . Tramadol     Major Headaches   Review of systems: Review of systems negative except as noted in HPI / PMHx or noted below: Constitutional: Negative.  HENT: Negative.   Eyes: Negative.  Respiratory: Negative.   Cardiovascular: Negative.  Gastrointestinal: Negative.  Genitourinary: Negative.  Musculoskeletal: Negative.    Neurological: Negative.  Endo/Heme/Allergies: Negative.  Cutaneous: Negative.  Past Medical History:  Diagnosis Date  . Arthritis   . C. difficile colitis 2011 and 07/2010  . Cervical dysplasia   . Hyperplastic polyp of intestine 09/2007  . Osteopenia 10/2016   T score -2.4 FRAX 3%/0.2%  . Urinary incontinence     Family History  Problem Relation Age of Onset  . Diabetes Father   . Diabetes Sister        x 2  . Hypertension Sister        x 2  . Diabetes Brother   . Hypertension Brother   . Colon cancer Neg Hx   . Esophageal cancer Neg Hx   . Pancreatic cancer Neg Hx   . Rectal cancer Neg Hx   . Stomach cancer Neg Hx     Social History   Social History  . Marital status: Married    Spouse name: N/A  . Number of children: 3  . Years of education: N/A   Occupational History  .  Retired Unemployed   Social History Main Topics  . Smoking status: Former Smoker    Years: 10.00    Types: Cigarettes    Quit date: 09/07/1988  . Smokeless tobacco: Never Used  . Alcohol use No  . Drug use: No  . Sexual activity: Yes    Birth control/ protection: Surgical     Comment: INTERCOURSE AGE 55, SEXUAL PARTNERS LESS THAN 5   Other Topics Concern  . Not on file   Social History Narrative  . No narrative on file    I appreciate the opportunity to take part in Annalese's care. Please do not hesitate to contact me with questions.  Sincerely,   R. Edgar Frisk, MD

## 2016-12-20 NOTE — Assessment & Plan Note (Signed)
Stable.  Continue Symbicort 80/4.5 g, 2 inhalations via spacer device twice a day, montelukast 10 mg daily bedtime, and albuterol HFA, 1-2 inhalations every 4-6 hours as needed.  Subjective and objective measures of pulmonary function will be followed and the treatment plan will be adjusted accordingly.

## 2016-12-20 NOTE — Assessment & Plan Note (Signed)
   Prednisone has been provided, 20 mg x 4 days, 10 mg x1 day, then stop.  A prescription has been provided for azelastine nasal spray, 1-2 sprays per nostril 2 times daily as needed. Proper nasal spray technique has been discussed and demonstrated.   Nasal saline lavage (NeilMed) has been recommended as needed and prior to medicated nasal sprays along with instructions for proper administration.  For thick post nasal drainage, add guaifenesin [phone] mg (Mucinex)  twice daily as needed with adequate hydration as discussed.  The patient has been asked to contact me if her symptoms persist, progress, or if she becomes febrile.

## 2016-12-20 NOTE — Assessment & Plan Note (Signed)
   Continue appropriate allergen avoidance measures and montelukast 10 mg daily at bedtime.  Azelastine nasal spray and nasal saline lavage have been recommended (as above).

## 2016-12-20 NOTE — Patient Instructions (Addendum)
Acute sinusitis  Prednisone has been provided, 20 mg x 4 days, 10 mg x1 day, then stop.  A prescription has been provided for azelastine nasal spray, 1-2 sprays per nostril 2 times daily as needed. Proper nasal spray technique has been discussed and demonstrated.   Nasal saline lavage (NeilMed) has been recommended as needed and prior to medicated nasal sprays along with instructions for proper administration.  For thick post nasal drainage, add guaifenesin [phone] mg (Mucinex)  twice daily as needed with adequate hydration as discussed.  The patient has been asked to contact me if her symptoms persist, progress, or if she becomes febrile.   Moderate persistent asthma Stable.  Continue Symbicort 80/4.5 g, 2 inhalations via spacer device twice a day, montelukast 10 mg daily bedtime, and albuterol HFA, 1-2 inhalations every 4-6 hours as needed.  Subjective and objective measures of pulmonary function will be followed and the treatment plan will be adjusted accordingly.  Other allergic rhinitis  Continue appropriate allergen avoidance measures and montelukast 10 mg daily at bedtime.  Azelastine nasal spray and nasal saline lavage have been recommended (as above).   Return in 4 months or sooner if needed.

## 2016-12-22 ENCOUNTER — Ambulatory Visit: Admitting: Allergy & Immunology

## 2017-01-17 ENCOUNTER — Encounter: Payer: Self-pay | Admitting: Endocrinology

## 2017-01-17 ENCOUNTER — Ambulatory Visit (INDEPENDENT_AMBULATORY_CARE_PROVIDER_SITE_OTHER): Admitting: Endocrinology

## 2017-01-17 ENCOUNTER — Ambulatory Visit
Admission: RE | Admit: 2017-01-17 | Discharge: 2017-01-17 | Disposition: A | Discharge status: Home / Self Care | Source: Ambulatory Visit | Attending: Endocrinology | Admitting: Endocrinology

## 2017-01-17 VITALS — BP 132/84 | HR 77 | Wt 179.6 lb

## 2017-01-17 DIAGNOSIS — E079 Disorder of thyroid, unspecified: Secondary | ICD-10-CM

## 2017-01-17 NOTE — Patient Instructions (Addendum)
Let's recheck the ultrasound.  you will receive a phone call, about a day and time for an appointment. If it is slightly larger, we should consider redoing the nuclear medicine test, that you had many years ago.   Please return in 1 year.

## 2017-01-17 NOTE — Progress Notes (Signed)
Subjective:    Patient ID: Brianna Santana, female    DOB: 09/13/1955, 61 y.o.   MRN: [id number]  HPI Pt returns for f/u of right thyroid mass (dx'ed 2000; she had hyperthyroidism, so she was rx'ed with RAI; prior to the rx, she had elevated iodine uptake, but she had neither [name] nor nuc med imaging; she has remained euthyroid off any rx; in 2016, she had bx, which showed Beth Cat 2; also in 2016, she had [name], which showed diffuse nodular enlargement of the right lobe measuring just under 5 cm in maximum diameter).  Pt says the thyroid mass is slightly larger.  pt states she feels well in general, except for fatigue.   Past Medical History:  Diagnosis Date  . Arthritis   . C. difficile colitis 2011 and 07/2010  . Cervical dysplasia   . Hyperplastic polyp of intestine 09/2007  . Osteopenia 10/2016   T score -2.4 FRAX 3%/0.2%  . Urinary incontinence     Past Surgical History:  Procedure Laterality Date  . BREAST LUMPECTOMY  1980's, 2000   Right Breast x 2  . COLONOSCOPY    . COLPOSCOPY    . FLEXIBLE SIGMOIDOSCOPY  03/29/2012   Procedure: FLEXIBLE SIGMOIDOSCOPY;  Surgeon: Ladene Artist, MD,FACG;  Location: [facility];  Service: Endoscopy;  Laterality: N/A;  . TONSILLECTOMY    . TOTAL ABDOMINAL HYSTERECTOMY  2000    Social History   Social History  . Marital status: Married    Spouse name: N/A  . Number of children: 3  . Years of education: N/A   Occupational History  . Retired Unemployed   Social History Main Topics  . Smoking status: Former Smoker    Years: 10.00    Types: Cigarettes    Quit date: 09/07/1988  . Smokeless tobacco: Never Used  . Alcohol use No  . Drug use: No  . Sexual activity: Yes    Birth control/ protection: Surgical     Comment: INTERCOURSE AGE 80, SEXUAL PARTNERS LESS THAN 5   Other Topics Concern  . Not on file   Social History Narrative  . No narrative on file    Current Outpatient Prescriptions on File Prior to Visit  Medication Sig  Dispense Refill  . Adalimumab (HUMIRA PEN) 40 MG/0.8ML PNKT Inject 40 mg into the skin every 14 (fourteen) days. 6 each 1  . albuterol (PROAIR HFA) 108 (90 Base) MCG/ACT inhaler Inhale 4 puffs into the lungs every 4 (four) hours as needed. For shortness of breath or wheezing 1 Inhaler 1  . azelastine (ASTELIN) 0.1 % nasal spray Spray 1-2 sprays per nostril twice daily 30 mL 5  . budesonide-formoterol (SYMBICORT) 80-4.5 MCG/ACT inhaler Inhale 2 puffs into the lungs 2 (two) times daily. 1 Inhaler 6  . Cholecalciferol (VITAMIN D3) 2000 units TABS Take 2 tablets by mouth daily.    . clobetasol cream (TEMOVATE) 4.01 % Apply 1 application topically as needed. For rash 30 g 1  . gabapentin (NEURONTIN) 300 MG capsule Take 300 mg by mouth 2 (two) times daily.     . magnesium 30 MG tablet Take 1 tablet (30 mg total) by mouth daily. 90 tablet 3  . meloxicam (MOBIC) 15 MG tablet Take 15 mg by mouth daily.    . montelukast (SINGULAIR) 10 MG tablet Take 1 tablet (10 mg total) by mouth at bedtime. 90 tablet 3  . Multiple Vitamin (MULTIVITAMIN WITH MINERALS) TABS Take 1 tablet by mouth daily.    Marland Kitchen  omeprazole (PRILOSEC) 40 MG capsule TAKE 1 CAPSULE BY MOUTH DAILY FOR HEARTBURN 90 capsule 3  . potassium chloride (K-DUR) 10 MEQ tablet Take 1 tablet (10 mEq total) by mouth daily. 90 tablet 3  . tiZANidine (ZANAFLEX) 4 MG capsule Take 4 mg by mouth 3 (three) times daily.    Marland Kitchen acetaminophen-codeine (TYLENOL #3) 300-30 MG tablet Take 1 tablet by mouth every 6 (six) hours as needed for moderate pain. (Patient not taking: Reported on 01/17/2017) 30 tablet 0  . cetirizine (ZYRTEC) 10 MG tablet Take 1 tablet (10 mg total) by mouth daily. 90 tablet 2  . guaiFENesin (ROBITUSSIN) 100 MG/5ML SOLN Take 5 mLs by mouth every 4 (four) hours as needed for cough or to loosen phlegm.     No current facility-administered medications on file prior to visit.     Allergies  Allergen Reactions  . Amoxicillin Itching  . Levofloxacin  Other (See Comments)    Doesn't remember reaction  . Seasonal Ic [Cholestatin]   . Tramadol     Major Headaches    Family History  Problem Relation Age of Onset  . Diabetes Father   . Diabetes Sister        x 2  . Hypertension Sister        x 2  . Diabetes Brother   . Hypertension Brother   . Colon cancer Neg Hx   . Esophageal cancer Neg Hx   . Pancreatic cancer Neg Hx   . Rectal cancer Neg Hx   . Stomach cancer Neg Hx     BP 132/84   Pulse 77   Wt 179 lb 9.6 oz (81.5 kg)   SpO2 96%   BMI 30.83 kg/m    Review of Systems Denies sob and dysphagia.      Objective:   Physical Exam VITAL SIGNS:  See vs page.   GENERAL: no distress.   NECK: the right thyroid mass is easily palpable.  It is freely mobile.   Lab Results  Component Value Date   TSH 2.01 11/22/2016   T3TOTAL 105.2 12/16/2014   T4TOTAL 7.1 12/16/2014      Assessment & Plan:  Thyroid mass, due for recheck.   Patient Instructions  Let's recheck the ultrasound.  you will receive a phone call, about a day and time for an appointment. If it is slightly larger, we should consider redoing the nuclear medicine test, that you had many years ago.   Please return in 1 year.

## 2017-01-18 ENCOUNTER — Other Ambulatory Visit: Payer: Self-pay | Admitting: Endocrinology

## 2017-01-19 ENCOUNTER — Telehealth: Payer: Self-pay | Admitting: Endocrinology

## 2017-01-19 NOTE — Progress Notes (Signed)
Called & spoke with patient's husband. She should be returning call when she returns home.

## 2017-01-19 NOTE — Telephone Encounter (Signed)
Patient returning missed phone call to discuss labs. Please call patient and advise.

## 2017-01-19 NOTE — Telephone Encounter (Signed)
Spoke with patient and notified her she would get a call to set up appt for biopsy.

## 2017-01-20 ENCOUNTER — Telehealth: Payer: Self-pay | Admitting: Endocrinology

## 2017-01-20 NOTE — Telephone Encounter (Signed)
[facility] needs an additional thyroid biopsy order placed in epic due to having to check both thyroids, an order needed for each. Call the number provided for [place] and select Option 1 followed by Option 4.

## 2017-01-20 NOTE — Telephone Encounter (Signed)
The larger nodule has already been biopsied, so there is no need.

## 2017-01-20 NOTE — Telephone Encounter (Signed)
Routing to you °

## 2017-01-21 NOTE — Telephone Encounter (Signed)
Spoke with [facility] imaging & they will be in contact with radiologist since they are recommending the second biopsy. They will be back in touch with [name] after speaking to Dr. Has(radiologist).

## 2017-02-02 ENCOUNTER — Ambulatory Visit (INDEPENDENT_AMBULATORY_CARE_PROVIDER_SITE_OTHER): Admitting: Allergy & Immunology

## 2017-02-02 ENCOUNTER — Encounter: Payer: Self-pay | Admitting: Allergy & Immunology

## 2017-02-02 VITALS — BP 120/70 | HR 80 | Temp 98.0°F | Resp 17

## 2017-02-02 DIAGNOSIS — J4540 Moderate persistent asthma, uncomplicated: Secondary | ICD-10-CM

## 2017-02-02 DIAGNOSIS — J3089 Other allergic rhinitis: Secondary | ICD-10-CM | POA: Diagnosis not present

## 2017-02-02 MED ORDER — FLUTICASONE FUROATE-VILANTEROL 200-25 MCG/INH IN AEPB: 1.0000 | INHALATION_SPRAY | Freq: Every day | RESPIRATORY_TRACT | 5 refills | Status: DC

## 2017-02-02 NOTE — Progress Notes (Signed)
FOLLOW UP  Date of Service/Encounter:  02/02/17   Assessment:   Moderate persistent asthma without complication  Perennial allergic rhinitis (cockroach, dust mite, molds)  Complex medical history, including Crohn's disease   Asthma Reportables:  Severity: moderate persistent  Risk: high Control: well controlled  Plan/Recommendations:   1. Moderate persistent asthma, uncomplicated - Lung function looked great today. - We will change you to a Breo sample and try to get this approved by the [facility].  - Daily controller medication(s): Singulair 75m daily + Breo 200/25 one puff once daily - Rescue medications: ProAir 4 puffs every 4-6 hours as needed - Asthma control goals:  * Full participation in all desired activities (may need albuterol before activity) * Albuterol use two time or less a week on average (not counting use with activity) * Cough interfering with sleep two time or less a month * Oral steroids no more than once a year * No hospitalizations  2. Chronic perennial rhinitis (cockroach, dust mite, molds) - Continue with nasal saline spray twice daily. - Continue with azelastine nasal spray two sprays per nostril 1-2 times daily as needed.  - Continue with fluticasone nasal spray two sprays per nostril 1-2 times daily as needed. - We will start allergy shots to see if this helps with your mucous and postnasal drip. - Make an appointment in two weeks for the first injection.   3. Return in about 3 months (around 05/04/2017).   Subjective:   DMARG MACMASTERis a 61y.o. female presenting today for follow up of  Chief Complaint  Patient presents with  . Asthma    DKatrine Cohohas a history of the following: Patient Active Problem List   Diagnosis Date Noted  . Acute sinusitis 12/20/2016  . Perennial allergic rhinitis 12/20/2016  . Cough 05/10/2016  . Acute left-sided thoracic back pain 04/05/2016  . Routine general medical examination at a health care  facility 05/02/2015  . Thyroid mass 01/16/2015  . Drug allergy 07/30/2014  . Crohn's disease ([facility] 03/18/2014  . Osteopenia 09/11/2013  . Hyperthyroidism   . GERD 11/02/2007  . Moderate persistent asthma without complication 034/[id number]   History obtained from: chart review and patient.  DDerek JackPrimary Care Provider is CHoyt Koch MD.     DLindsyis a 61y.o. female presenting for a follow up visit. She was last seen by me in February 2018. At that time, we continued her on Singulair 10 mg daily in conjunction with Symbicort 80/4.52 puffs twice daily. For her chronic rhinitis, we continued her on nasal saline rinses twice daily. She also had a viral sinusitis at that time and I provided a prednisone pack to start if her symptoms worsen. Her last testing was performed in January 2013 and was positive to dust mites, cockroach, and a couple of molds.   Since the last visit, she has done well. She did see Dr. BVerlin Festerin July 2018 for sinusitis. Dr. BVerlin Festerprovided a prednisone taper as well as the addition of as lasting nasal spray. He also recommended nasal saline lavages as well as Mucinex twice daily. She continues to have postnasal drip with some yellow discharge. The sinus pain is much better, but the phlegm has continued to be stable. She is using nasal saline rinses and her two nasal sprays. She has never been on allergy shots in the past. She does have a history of dust mite and cockroach allergy as well as some isolated  molds. She does not think she has any cockroach issues. She does have dust mite coverings on her bedding, but there is a fair amount of carpeting in the home.  In fact, she actually thinks that she develops worsening phlegm production with ingestion of certain foods. She knows that pork and maybe some other triggers make her symptoms worse. However this is not always the case. She does have GERD and takes omeprazole.   Her asthma is well controlled. She  is on Symbicort 80/4.5 g 2 puffs in the morning and 2 puffs at night. She would like to change to something easier and reports to me that she has been on Breo in the past and loved it. She thinks that she just got a sample from a previous provider and she never received it through the [place].  She does have a history of Crohn's disease and was on mercaptopurine. She was taken off because her GI doctor thought that the 6MP was exacerbating her reflux; this was in June 2018. She is followed closely dr Dr. Kennedy Bucker. She remains on her Humira and has had no worsening flares of her colitis. She actually feels that since stopping the 6MP.   Otherwise, there have been no changes to her past medical history, surgical history, family history, or social history.    Review of Systems: a 14-point review of systems is pertinent for what is mentioned in HPI.  Otherwise, all other systems were negative. Constitutional: negative other than that listed in the HPI Eyes: negative other than that listed in the HPI Ears, nose, mouth, throat, and face: negative other than that listed in the HPI Respiratory: negative other than that listed in the HPI Cardiovascular: negative other than that listed in the HPI Gastrointestinal: negative other than that listed in the HPI Genitourinary: negative other than that listed in the HPI Integument: negative other than that listed in the HPI Hematologic: negative other than that listed in the HPI Musculoskeletal: negative other than that listed in the HPI Neurological: negative other than that listed in the HPI Allergy/Immunologic: negative other than that listed in the HPI    Objective:   Blood pressure 120/70, pulse 80, temperature 98 F (36.7 C), temperature source Oral, resp. rate 17, SpO2 98 %. There is no height or weight on file to calculate BMI.   Physical Exam:  General: Alert, interactive, in no acute distress. Very adorable appreciative female.  Eyes: No  conjunctival injection present on the right, No conjunctival injection present on the left, PERRL bilaterally, No discharge on the right, No discharge on the left and No Horner-Trantas dots present Ears: Right TM pearly gray with normal light reflex, Left TM pearly gray with normal light reflex, Right TM intact without perforation and Left TM intact without perforation.  Nose/Throat: External nose within normal limits and septum midline, turbinates edematous and pale with clear discharge, post-pharynx erythematous with cobblestoning in the posterior oropharynx. Tonsils 2+ without exudates Neck: Supple without thyromegaly. Lungs: Clear to auscultation without wheezing, rhonchi or rales. No increased work of breathing. CV: Normal S1/S2, no murmurs. Capillary refill <2 seconds.  Skin: Warm and dry, without lesions or rashes. Neuro:   Grossly intact. No focal deficits appreciated. Responsive to questions.   Diagnostic studies:   Spirometry: results normal (FEV1: 2.03/99%, FVC: 2.85/1121%, FEV1/FVC: 70%).    Spirometry consistent with normal pattern.   Allergy Studies: none     Salvatore Marvel, MD [facility]

## 2017-02-02 NOTE — Patient Instructions (Addendum)
1. Moderate persistent asthma, uncomplicated - Lung function looked great today. - We will change you to a Breo sample and try to get this approved by the [facility].  - Daily controller medication(s): Singulair 51m daily + Breo 200/25 one puff once daily - Rescue medications: ProAir 4 puffs every 4-6 hours as needed - Asthma control goals:  * Full participation in all desired activities (may need albuterol before activity) * Albuterol use two time or less a week on average (not counting use with activity) * Cough interfering with sleep two time or less a month * Oral steroids no more than once a year * No hospitalizations  2. Chronic perennial rhinitis (cockroach, dust mite, molds) - Continue with nasal saline spray twice daily. - Continue with azelastine nasal spray two sprays per nostril 1-2 times daily as needed.  - Continue with fluticasone nasal spray two sprays per nostril 1-2 times daily as needed. - We will start allergy shots to see if this helps with your mucous and postnasal drip. - Make an appointment in two weeks for the first injection.   3. Return in about 3 months (around 05/04/2017).  Please inform u[place]of any Emergency Department visits, hospitalizations, or changes in symptoms. Call [place]efore going to the ED for breathing or allergy symptoms since we might be able to fit you in for a sick visit. Feel free to contact u[place]anytime with any questions, problems, or concerns.  It was a pleasure to see you again today! Enjoy the upcoming fall season!  Websites that have reliable patient information: 1. American Academy of Asthma, Allergy, and Immunology: www.aaaai.org 2. Food Allergy Research and Education (FARE): foodallergy.org 3. Mothers of Asthmatics: http://www.asthmacommunitynetwork.org 4. American College of Allergy, Asthma, and Immunology: www.acaai.org   Election Day is coming up on Tuesday, November 6th! Make your voice heard! Register to vote at vote.org!

## 2017-03-08 ENCOUNTER — Other Ambulatory Visit (HOSPITAL_COMMUNITY)
Admission: RE | Admit: 2017-03-08 | Discharge: 2017-03-08 | Disposition: A | Discharge status: Home / Self Care | Source: Ambulatory Visit | Attending: Student | Admitting: Student

## 2017-03-08 ENCOUNTER — Telehealth: Payer: Self-pay | Admitting: Allergy & Immunology

## 2017-03-08 ENCOUNTER — Ambulatory Visit
Admission: RE | Admit: 2017-03-08 | Discharge: 2017-03-08 | Disposition: A | Discharge status: Home / Self Care | Source: Ambulatory Visit | Attending: Endocrinology | Admitting: Endocrinology

## 2017-03-08 DIAGNOSIS — E079 Disorder of thyroid, unspecified: Secondary | ICD-10-CM | POA: Diagnosis not present

## 2017-03-08 MED ORDER — MONTELUKAST SODIUM 10 MG PO TABS: 10.0000 mg | ORAL_TABLET | Freq: Every day | ORAL | 0 refills | Status: DC

## 2017-03-08 NOTE — Telephone Encounter (Signed)
90 day supply sent to [facility].

## 2017-03-08 NOTE — Telephone Encounter (Signed)
Pt called and needs to have montelukast 10 mg. Called into [facility]. [phone].

## 2017-03-22 ENCOUNTER — Telehealth: Payer: Self-pay | Admitting: Gastroenterology

## 2017-03-22 NOTE — Telephone Encounter (Signed)
Informed patient that her appt in December is fine and she will be due for blood work at her appt. She can get it her labs at her appt. Patient verbalized understanding.

## 2017-05-05 ENCOUNTER — Ambulatory Visit (INDEPENDENT_AMBULATORY_CARE_PROVIDER_SITE_OTHER): Admitting: Allergy & Immunology

## 2017-05-05 ENCOUNTER — Encounter: Payer: Self-pay | Admitting: Allergy & Immunology

## 2017-05-05 VITALS — BP 110/75 | HR 75 | Temp 98.5°F | Resp 17

## 2017-05-05 DIAGNOSIS — J3089 Other allergic rhinitis: Secondary | ICD-10-CM

## 2017-05-05 DIAGNOSIS — J4540 Moderate persistent asthma, uncomplicated: Secondary | ICD-10-CM | POA: Diagnosis not present

## 2017-05-05 NOTE — Progress Notes (Signed)
FOLLOW UP  Date of Service/Encounter:  05/05/17   Assessment:   Moderate persistent asthma without complication  Perennial allergic rhinitis  (cockroach, molds, dust mites)   Asthma Reportables:  Severity: moderate persistent  Risk: low Control: well controlled   Plan/Recommendations:   1. Moderate persistent asthma, uncomplicated - Lung function looked great today. - Samples of Symbicort provided.  - Daily controller medication(s): Singulair 48m daily + Symbicort 160/4.5 two puffs twice daily with spacer - Rescue medications: ProAir 4 puffs every 4-6 hours as needed - Asthma control goals:  * Full participation in all de r less a week on average (not counting use with activity) * Cough interfering with sleep two time or less a month * Oral steroids no more than once a year * No hospitalizations  2. Chronic perennial rhinitis (cockroach, dust mite, molds) - Continue with nasal saline spray twice daily. - Continue with azelastine nasal spray two sprays per nostril 1-2 times daily as needed.  - Continue with fluticasone nasal spray two sprays per nostril 1-2 times daily as needed. - Call your insurance company to make sure that they are covered.  - Give [place] call when you make a decision.   3. Return in about 6 months (around 11/03/2017).  Subjective:   Brianna LAMYis a 61y.o. female presenting today for follow up of  Chief Complaint  Patient presents with  . Asthma    Brianna Santana a history of the following: Patient Active Problem List   Diagnosis Date Noted  . Acute sinusitis 12/20/2016  . Perennial allergic rhinitis 12/20/2016  . Cough 05/10/2016  . Acute left-sided thoracic back pain 04/05/2016  . Routine general medical examination at a health care facility 05/02/2015  . Thyroid mass 01/16/2015  . Drug allergy 07/30/2014  . Crohn's disease ([place] 03/18/2014  . Osteopenia 09/11/2013  . Hyperthyroidism   . GERD 11/02/2007  . Moderate  persistent asthma without complication 063/05/6008   History obtained from: chart review and patient.  DDerek JackPrimary Care Provider is BKristen Loader Santana.     Brianna Santana a 61y.o. female presenting for a follow up visit. She was last seen in September 2018. At that time, she was doing well. We changed her [facility]icort [facility] wanted something that was easier to use and required less dosing. She had previously done well on Breo and was excited about this change. We kept her on her nasal sprays for her allergic rhinitis.   Since the last visit, she has done well. She restarted the Symbicort since this was less expensive than Breo. The BMemory Dancewas going to cost her more than $100, where the Symbicort was costing her around $75 per month. With the Symbicort, she has done well. Brianna Santana's asthma has been well controlled. She has not required rescue medication, experienced nocturnal awakenings due to lower respiratory symptoms, nor have activities of daily living been limited. She has required no Emergency Department or Urgent Care visits for her asthma. She has required zero courses of systemic steroids for asthma exacerbations since the last visit. ACT score today is 16, indicating subpar asthma symptom control. ACT score is so low due to recent viral URI.   For Thanksgiving, she went [place] was on the trip, she has had some marked mucous production and ended up treating this with a homeopathic medication ([facility]  Root syrup), which she obtained at [facility] [employer] store. Overall her symptoms are improiving. She remains on her nasal sprays as well as nasal saline. She never did check with her insurance to see how much allergy shots would cost, but she does remain interested in this.   Otherwise, there have been no changes to her past medical history, surgical history, family history, or social  history.    Review of Systems: a 14-point review of systems is pertinent for what is mentioned in HPI.  Otherwise, all other systems were negative. Constitutional: negative other than that listed in the HPI Eyes: negative other than that listed in the HPI Ears, nose, mouth, throat, and face: negative other than that listed in the HPI Respiratory: negative other than that listed in the HPI Cardiovascular: negative other than that listed in the HPI Gastrointestinal: negative other than that listed in the HPI Genitourinary: negative other than that listed in the HPI Integument: negative other, than that listed in the HPI Hematologic: negative other than that listed in the HPI Musculoskeletal: negative other than that listed in the HPI Neurological: negative other than that listed in the HPI Allergy/Immunologic: negative other than that listed in the HPI    Objective:   Blood pressure 110/75, pulse 75, temperature 98.5 F (36.9 C), temperature source Oral, resp. rate 17, SpO2 98 %. There is no height or weight on file to calculate BMI.   Physical Exam:  General: Alert, interactive, in no acute distress. Pleasant female. Delightful and talkative.  Eyes: No conjunctival injection bilaterally, no discharge on the right, no discharge on the left and no Horner-Trantas dots present. PERRL bilaterally. EOMI without pain. No photophobia.  Ears: Right TM pearly gray with normal light reflex, Left TM pearly gray with normal light reflex, Right TM intact without perforation and Left TM intact without perforation.  Nose/Throat: External nose within normal limits and septum midline. Turbinates edematous without discharge. Posterior oropharynx mildly erythematous without cobblestoning in the posterior oropharynx. Tonsils 2+ without exudates.  Tongue without thrush. Adenopathy: no enlarged lymph nodes appreciated in the anterior cervical, occipital, axillary, epitrochlear, inguinal, or popliteal  regions. Lungs: Clear to auscultation without wheezing, rhonchi or rales. No increased work of breathing. CV: Normal S1/S2. No murmurs. Capillary refill <2 seconds.  Skin: Warm and dry, without lesions or rashes. Neuro:   Grossly intact. No focal deficits appreciated. Responsive to questions.  Diagnostic studies: none    Salvatore Marvel, MD [facility]

## 2017-05-05 NOTE — Patient Instructions (Addendum)
1. Moderate persistent asthma, uncomplicated - Lung function looked great today. - Samples of Symbicort provided.  - Daily controller medication(s): Singulair 52m daily + Symbicort 160/4.5 two puffs twice daily with spacer - Rescue medications: ProAir 4 puffs every 4-6 hours as needed - Asthma control goals:  * Full participation in all de r less a week on average (not counting use with activity) * Cough interfering with sleep two time or less a month * Oral steroids no more than once a year * No hospitalizations  2. Chronic perennial rhinitis (cockroach, dust mite, molds) - Continue with nasal saline spray twice daily. - Continue with azelastine nasal spray two sprays per nostril 1-2 times daily as needed.  - Continue with fluticasone nasal spray two sprays per nostril 1-2 times daily as needed. - Call your insurance company to make sure that they are covered.  - Give [place] call when you make a decision.   3. Return in about 6 months (around 11/03/2017).    Please inform u[place]of any Emergency Department visits, hospitalizations, or changes in symptoms. Call u[place]efore going to the ED for breathing or allergy symptoms since we might be able to fit you in for a sick visit. Feel free to contact u[place]oreaanytime with any questions, problems, or concerns.  It was a pleasure to see you and your family again today! Enjoy the holiday season!  Websites that have reliable patient information: 1. American Academy of Asthma, Allergy, and Immunology: www.aaaai.org 2. Food Allergy Research and Education (FARE): foodallergy.org 3. Mothers of Asthmatics: http://www.asthmacommunitynetwork.org 4. American College of Allergy, Asthma, and Immunology: www.acaai.org

## 2017-05-12 ENCOUNTER — Ambulatory Visit (INDEPENDENT_AMBULATORY_CARE_PROVIDER_SITE_OTHER): Admitting: Gastroenterology

## 2017-05-12 ENCOUNTER — Encounter: Payer: Self-pay | Admitting: Gastroenterology

## 2017-05-12 ENCOUNTER — Other Ambulatory Visit (INDEPENDENT_AMBULATORY_CARE_PROVIDER_SITE_OTHER)

## 2017-05-12 VITALS — BP 116/70 | HR 68 | Ht 64.0 in | Wt 177.2 lb

## 2017-05-12 DIAGNOSIS — K5010 Crohn's disease of large intestine without complications: Secondary | ICD-10-CM

## 2017-05-12 DIAGNOSIS — K219 Gastro-esophageal reflux disease without esophagitis: Secondary | ICD-10-CM | POA: Diagnosis not present

## 2017-05-12 DIAGNOSIS — R7989 Other specified abnormal findings of blood chemistry: Secondary | ICD-10-CM

## 2017-05-12 DIAGNOSIS — R945 Abnormal results of liver function studies: Secondary | ICD-10-CM | POA: Diagnosis not present

## 2017-05-12 LAB — HEPATIC FUNCTION PANEL
ALT: 21 U/L (ref 0–35)
AST: 20 U/L (ref 0–37)
Albumin: 4.6 g/dL (ref 3.5–5.2)
Alkaline Phosphatase: 82 U/L (ref 39–117)
Bilirubin, Direct: 0.2 mg/dL (ref 0.0–0.3)
Total Bilirubin: 0.8 mg/dL (ref 0.2–1.2)
Total Protein: 7.8 g/dL (ref 6.0–8.3)

## 2017-05-12 MED ORDER — ADALIMUMAB 40 MG/0.8ML ~~LOC~~ AJKT: 40.0000 mg | AUTO-INJECTOR | SUBCUTANEOUS | 1 refills | Status: DC

## 2017-05-12 NOTE — Progress Notes (Signed)
    History of Present Illness: This is a 61 year old female with Crohn's colitis and GERD.  She occasionally has transient left upper quadrant pain that is described as very mild and brief duration.  It is not appear to be related to bowel movements or meals.  She has no other complaints.  Current Medications, Allergies, Past Medical History, Past Surgical History, Family History and Social History were reviewed in [employer] record.  Physical Exam: General: Well developed, well nourished, no acute distress Head: Normocephalic and atraumatic Eyes:  sclerae anicteric, EOMI Ears: Normal auditory acuity Mouth: No deformity or lesions Lungs: Clear throughout to auscultation Heart: Regular rate and rhythm; no murmurs, rubs or bruits Abdomen: Soft, non tender and non distended. No masses, hepatosplenomegaly or hernias noted. Normal Bowel sounds Rectal: not done Musculoskeletal: Symmetrical with no gross deformities  Pulses:  Normal pulses noted Extremities: No clubbing, cyanosis, edema or deformities noted Neurological: Alert oriented x 4, grossly nonfocal Psychological:  Alert and cooperative. Normal mood and affect  Assessment and Recommendations:  1. Crohn's colitis, asymptomatic.  Continue Humira 40 mg q14 days.  Obtain recent blood work done by her PCP.  QuantiFERON gold and hep H surface antigen today. REV in 6 months.   2.  GERD.  Follow antireflux measures and continue omeprazole 40 mg daily.

## 2017-05-12 NOTE — Patient Instructions (Signed)
Your physician has requested that you go to the basement for lab work before leaving today.  We have sent the following prescriptions to your mail in pharmacy: Humira   If you have not heard from your mail in pharmacy within 1 week or if you have not received your medication in the mail, please contact us at [phone] so we may find out why.  Thank you for choosing me and [facility].  Pricilla Riffle. Dagoberto Ligas., MD., Marval Regal

## 2017-05-13 LAB — TIQ-NTM

## 2017-05-13 LAB — QUANTIFERON TB GOLD ASSAY (BLOOD)

## 2017-05-16 ENCOUNTER — Other Ambulatory Visit: Payer: Self-pay

## 2017-05-16 DIAGNOSIS — K50119 Crohn's disease of large intestine with unspecified complications: Secondary | ICD-10-CM

## 2017-05-18 ENCOUNTER — Other Ambulatory Visit

## 2017-05-18 DIAGNOSIS — K50119 Crohn's disease of large intestine with unspecified complications: Secondary | ICD-10-CM

## 2017-05-18 LAB — QUANTIFERON-TB GOLD PLUS
Mitogen-NIL: >10 IU/mL
NIL: 0.03 IU/mL
QuantiFERON-TB Gold Plus: NEGATIVE
TB1-NIL: 0.01 IU/mL
TB2-NIL: 0 IU/mL

## 2017-05-18 LAB — TEST AUTHORIZATION

## 2017-05-18 LAB — HEPATITIS B SURFACE ANTIGEN: Hepatitis B Surface Ag: NONREACTIVE

## 2017-05-18 LAB — QUANTIFERON(R)-TB

## 2017-05-20 LAB — QUANTIFERON-TB GOLD PLUS
Mitogen-NIL: >10 IU/mL
NIL: 0.03 IU/mL
QuantiFERON-TB Gold Plus: NEGATIVE
TB1-NIL: 0 IU/mL
TB2-NIL: 0 IU/mL

## 2017-06-07 ENCOUNTER — Telehealth: Payer: Self-pay | Admitting: Allergy & Immunology

## 2017-06-07 MED ORDER — MONTELUKAST SODIUM 10 MG PO TABS: 10.0000 mg | ORAL_TABLET | Freq: Every day | ORAL | 1 refills | Status: DC

## 2017-06-07 MED ORDER — ALBUTEROL SULFATE HFA 108 (90 BASE) MCG/ACT IN AERS: 4.0000 | INHALATION_SPRAY | RESPIRATORY_TRACT | 1 refills | Status: DC | PRN

## 2017-06-07 MED ORDER — BUDESONIDE-FORMOTEROL FUMARATE 80-4.5 MCG/ACT IN AERO: 2.0000 | INHALATION_SPRAY | Freq: Two times a day (BID) | RESPIRATORY_TRACT | 5 refills | Status: DC

## 2017-06-07 NOTE — Telephone Encounter (Signed)
Pt called and needs to have refills of symbicort, proair, montelukast called into [facility] on [address]. [phone]

## 2017-06-07 NOTE — Telephone Encounter (Signed)
rx refill sent in

## 2017-07-04 ENCOUNTER — Other Ambulatory Visit: Payer: Self-pay | Admitting: Internal Medicine

## 2017-07-04 ENCOUNTER — Other Ambulatory Visit: Payer: Self-pay | Admitting: Family Medicine

## 2017-07-04 DIAGNOSIS — Z1231 Encounter for screening mammogram for malignant neoplasm of breast: Secondary | ICD-10-CM

## 2017-07-26 ENCOUNTER — Ambulatory Visit
Admission: RE | Admit: 2017-07-26 | Discharge: 2017-07-26 | Disposition: A | Discharge status: Home / Self Care | Source: Ambulatory Visit | Attending: Family Medicine | Admitting: Family Medicine

## 2017-07-26 ENCOUNTER — Ambulatory Visit

## 2017-07-26 DIAGNOSIS — Z1231 Encounter for screening mammogram for malignant neoplasm of breast: Secondary | ICD-10-CM

## 2017-08-28 ENCOUNTER — Emergency Department (HOSPITAL_COMMUNITY)
Admission: EM | Admit: 2017-08-28 | Discharge: 2017-08-28 | Disposition: A | Discharge status: Home / Self Care | Attending: Emergency Medicine | Admitting: Emergency Medicine

## 2017-08-28 ENCOUNTER — Emergency Department (HOSPITAL_COMMUNITY)

## 2017-08-28 ENCOUNTER — Encounter (HOSPITAL_COMMUNITY): Payer: Self-pay | Admitting: Emergency Medicine

## 2017-08-28 DIAGNOSIS — Z79899 Other long term (current) drug therapy: Secondary | ICD-10-CM | POA: Diagnosis not present

## 2017-08-28 DIAGNOSIS — Y929 Unspecified place or not applicable: Secondary | ICD-10-CM | POA: Insufficient documentation

## 2017-08-28 DIAGNOSIS — Z87891 Personal history of nicotine dependence: Secondary | ICD-10-CM | POA: Diagnosis not present

## 2017-08-28 DIAGNOSIS — Y9301 Activity, walking, marching and hiking: Secondary | ICD-10-CM | POA: Diagnosis not present

## 2017-08-28 DIAGNOSIS — S99922A Unspecified injury of left foot, initial encounter: Secondary | ICD-10-CM | POA: Diagnosis present

## 2017-08-28 DIAGNOSIS — Y999 Unspecified external cause status: Secondary | ICD-10-CM | POA: Diagnosis not present

## 2017-08-28 DIAGNOSIS — S92515A Nondisplaced fracture of proximal phalanx of left lesser toe(s), initial encounter for closed fracture: Secondary | ICD-10-CM | POA: Diagnosis not present

## 2017-08-28 DIAGNOSIS — W228XXA Striking against or struck by other objects, initial encounter: Secondary | ICD-10-CM | POA: Diagnosis not present

## 2017-08-28 MED ORDER — ACETAMINOPHEN 325 MG PO TABS
650.0000 mg | ORAL_TABLET | Freq: Once | ORAL | Status: AC
  Administered 2017-08-28: 650 mg via ORAL
  Filled 2017-08-28: qty 2

## 2017-08-28 NOTE — ED Provider Notes (Signed)
[facility] DEPT Provider Note   CSN: [id number] Arrival date & time: 08/28/17  1313     History   Chief Complaint Chief Complaint  Patient presents with  . Foot Pain    HPI Brianna Santana is a 62 y.o. female with a history of Crohn's disease and arthritis who presents to the emergency department today complaining of left foot injury that occurred yesterday.  Patient states that she was ambulating when she bumped her third toe on a chair.  She has had constant pain to the third toe and forefoot since injury.  Rates her pain an 8 out of 10 in severity at present.  States that she tried some ibuprofen last night without significant improvement.  Pain is worse when ambulating/weightbearing.  States she had some associated bruising and swelling.  Denies numbness, tingling, or weakness.  No other areas of injury.  HPI  Past Medical History:  Diagnosis Date  . Arthritis   . C. difficile colitis 2011 and 07/2010  . Cervical dysplasia   . Hyperplastic polyp of intestine 09/2007  . Osteopenia 10/2016   T score -2.4 FRAX 3%/0.2%  . Urinary incontinence     Patient Active Problem List   Diagnosis Date Noted  . Acute sinusitis 12/20/2016  . Perennial allergic rhinitis 12/20/2016  . Cough 05/10/2016  . Acute left-sided thoracic back pain 04/05/2016  . Routine general medical examination at a health care facility 05/02/2015  . Thyroid mass 01/16/2015  . Drug allergy 07/30/2014  . Crohn's disease ([place]) 03/18/2014  . Osteopenia 09/11/2013  . Hyperthyroidism   . GERD 11/02/2007  . Moderate persistent asthma without complication 91/63/8466    Past Surgical History:  Procedure Laterality Date  . BREAST CYST ASPIRATION Right    2000  . BREAST EXCISIONAL BIOPSY Right    1980's  . COLONOSCOPY    . COLPOSCOPY    . FLEXIBLE SIGMOIDOSCOPY  03/29/2012   Procedure: FLEXIBLE SIGMOIDOSCOPY;  Surgeon: Ladene Artist, MD,FACG;  Location: [facility];  Service:  Endoscopy;  Laterality: N/A;  . TONSILLECTOMY    . TOTAL ABDOMINAL HYSTERECTOMY  2000     OB History    Gravida  3   Para  3   Term  3   Preterm      AB      Living  3     SAB      TAB      Ectopic      Multiple      Live Births               Home Medications    Prior to Admission medications   Medication Sig Start Date End Date Taking? Authorizing Provider  Adalimumab (HUMIRA PEN) 40 MG/0.8ML PNKT Inject 40 mg into the skin every 14 (fourteen) days. 05/12/17   Ladene Artist, MD  albuterol (PROAIR HFA) 108 (90 Base) MCG/ACT inhaler Inhale 4 puffs into the lungs every 4 (four) hours as needed. For shortness of breath or wheezing 06/07/17   Valentina Shaggy, MD  azelastine (ASTELIN) 0.1 % nasal spray Spray 1-2 sprays per nostril twice daily 12/20/16   Bobbitt, Sedalia Muta, MD  budesonide-formoterol [facility]) 80-4.5 MCG/ACT inhaler Inhale 2 puffs into the lungs 2 (two) times daily. 06/07/17   Valentina Shaggy, MD  cetirizine (ZYRTEC) 10 MG tablet Take 1 tablet (10 mg total) by mouth daily. 02/25/16 05/12/17  Valentina Shaggy, MD  Cholecalciferol (VITAMIN D3) 2000 units TABS  Take 2 tablets by mouth daily.    [provider]  clobetasol cream (TEMOVATE) 5.05 % Apply 1 application topically as needed. For rash 09/16/15   Huel Cote, NP  gabapentin (NEURONTIN) 300 MG capsule Take 300 mg by mouth 2 (two) times daily.     [provider]  magnesium 30 MG tablet Take 1 tablet (30 mg total) by mouth daily. 04/29/16   Hoyt Koch, MD  meloxicam (MOBIC) 15 MG tablet Take 15 mg by mouth daily.    [provider]  montelukast (SINGULAIR) 10 MG tablet Take 1 tablet (10 mg total) by mouth at bedtime. 06/07/17   Valentina Shaggy, MD  Multiple Vitamin (MULTIVITAMIN WITH MINERALS) TABS Take 1 tablet by mouth daily.    [provider]  omeprazole (PRILOSEC) 40 MG capsule TAKE 1 CAPSULE BY MOUTH DAILY FOR HEARTBURN  11/22/16   Ladene Artist, MD  potassium chloride (K-DUR) 10 MEQ tablet Take 1 tablet (10 mEq total) by mouth daily. 04/05/16   Hoyt Koch, MD  tiZANidine (ZANAFLEX) 4 MG capsule Take 4 mg by mouth 3 (three) times daily.    [provider]    Family History Family History  Problem Relation Age of Onset  . Diabetes Father   . Diabetes Sister        x 2  . Hypertension Sister        x 2  . Diabetes Brother   . Hypertension Brother   . Colon cancer Neg Hx   . Esophageal cancer Neg Hx   . Pancreatic cancer Neg Hx   . Rectal cancer Neg Hx   . Stomach cancer Neg Hx     Social History Social History   Tobacco Use  . Smoking status: Former Smoker    Years: 10.00    Types: Cigarettes    Last attempt to quit: 09/07/1988    Years since quitting: 28.9  . Smokeless tobacco: Never Used  Substance Use Topics  . Alcohol use: No    Alcohol/week: 0.0 oz  . Drug use: No     Allergies   Amoxicillin; Levofloxacin; Seasonal ic [cholestatin]; and Tramadol   Review of Systems Review of Systems  Musculoskeletal:       Positive for pain to the left third digit and left forefoot.  Neurological: Negative for weakness and numbness.    Physical Exam Updated Vital Signs BP 136/73 (BP Location: Right Arm)   Pulse 76   Temp 98.5 F (36.9 C) (Oral)   Resp 18   SpO2 97%   Physical Exam  Constitutional: She appears well-developed and well-nourished. No distress.  HENT:  Head: Normocephalic and atraumatic.  Eyes: Conjunctivae are normal. Right eye exhibits no discharge. Left eye exhibits no discharge.  Cardiovascular:  Pulses:      Dorsalis pedis pulses are 2+ on the right side, and 2+ on the left side.       Posterior tibial pulses are 2+ on the right side, and 2+ on the left side.  Musculoskeletal:  Lower extremities: Patient has some ecchymosis and mild soft tissue swelling to the left third proximal phalanx as well as to the central dorsal forefoot.  She is able  to move all toes, she has full range of motion at all joints.  She is tender to palpation over the entire third digit extending to the second third and fourth metacarpals.  No other bony tenderness.  Specifically no tenderness at the base of the  fifth, navicular bone, or medial/lateral malleoli.  Neurological: She is alert.  Clear speech.  Sensation grossly intact bilateral lower extremities.  Gait is antalgic.  Patient is able to move all toes.  Skin: Capillary refill takes less than 2 seconds.  Psychiatric: She has a normal mood and affect. Her behavior is normal. Thought content normal.  Nursing note and vitals reviewed.    ED Treatments / Results  Labs (all labs ordered are listed, but only abnormal results are displayed) Labs Reviewed - No data to display  EKG None  Radiology Dg Foot Complete Left  Result Date: 08/28/2017 CLINICAL DATA:  Third toe pain EXAM: LEFT FOOT - COMPLETE 3+ VIEW COMPARISON:  None. FINDINGS: There is an oblique fracture of shaft of the third proximal phalanx. There is no evidence of arthropathy or other focal bone abnormality. Soft tissues are unremarkable. IMPRESSION: Acute oblique fracture of shaft of the third proximal phalanx. Electronically Signed   By: Kathreen Devoid   On: 08/28/2017 14:20    Procedures Procedures (including critical care time)  Medications Ordered in ED Medications  acetaminophen (TYLENOL) tablet 650 mg (has no administration in time range)     Initial Impression / Assessment and Plan / ED Course  I have reviewed the triage vital signs and the nursing notes.  Pertinent labs & imaging results that were available during my care of the patient were reviewed by me and considered in my medical decision making (see chart for details).   Patient presents with left foot injury, acute oblique fracture of the shaft of the third proximal phalanx identified via x-ray.  Patient is neurovascularly intact distally.  Will buddy tape toes and  place patient in a postop shoe with crutches.  Recommended Tylenol per over-the-counter dosing instructions, NSAIDs avoided given history of Crohn's disease. I discussed results, treatment plan, need for orthopedics follow-up, and return precautions with the patient. Provided opportunity for questions, patient confirmed understanding and is in agreement with plan.   Final Clinical Impressions(s) / ED Diagnoses   Final diagnoses:  Closed nondisplaced fracture of proximal phalanx of lesser toe of left foot, initial encounter    ED Discharge Orders    None       Amaryllis Dyke, PA-C 08/28/17 1751    Pattricia Boss, MD 08/31/17 [phone]

## 2017-08-28 NOTE — ED Triage Notes (Signed)
Patient here from with complaints of left foot pain. Reports hitting foot on chair. Pain 9/10.

## 2017-08-28 NOTE — Discharge Instructions (Addendum)
Please read and follow all provided instructions.  You have been seen today for a left foot injury.   Tests performed today include: An x-ray of the affected area revealed a fracture of your toe- specifically a fracture of your 3rd proximal phalanx.  Vital signs. See below for your results today.   Home care instructions: -- *PRICE in the first 24-48 hours after injury: Protect (with brace, splint, sling), if given by your provider Rest Ice- Do not apply ice pack directly to your skin, place towel or similar between your skin and ice/ice pack. Apply ice for 20 min, then remove for 40 min while awake Compression- Wear brace, elastic bandage, splint as directed by your provider Elevate affected extremity above the level of your heart when not walking around for the first 24-48 hours   Take tylenol per over the counter dosing instructions for pain.   Follow-up instructions: Please follow-up with your primary care provider or the provided orthopedic physician (bone specialist) if you continue to have significant pain in 1 week. In this case you may have a more severe injury that requires further care.   Return instructions:  Please return if your toes or feet are numb or tingling, appear gray or blue, or you have severe pain (also elevate the leg and loosen splint or wrap if you were given one) Please return to the Emergency Department if you experience worsening symptoms.  Please return if you have any other emergent concerns. Additional Information:  Your vital signs today were: BP 136/73 (BP Location: Right Arm)    Pulse 76    Temp 98.5 F (36.9 C) (Oral)    Resp 18    SpO2 97%  If your blood pressure (BP) was elevated above 135/85 this visit, please have this repeated by your doctor within one month. ---------------

## 2017-09-19 ENCOUNTER — Encounter: Admitting: Women's Health

## 2017-10-04 ENCOUNTER — Encounter: Admitting: Women's Health

## 2017-11-03 ENCOUNTER — Encounter: Payer: Self-pay | Admitting: Allergy & Immunology

## 2017-11-03 ENCOUNTER — Ambulatory Visit (INDEPENDENT_AMBULATORY_CARE_PROVIDER_SITE_OTHER): Admitting: Allergy & Immunology

## 2017-11-03 VITALS — BP 124/74 | HR 80 | Temp 97.8°F | Resp 18

## 2017-11-03 DIAGNOSIS — J4540 Moderate persistent asthma, uncomplicated: Secondary | ICD-10-CM

## 2017-11-03 DIAGNOSIS — J3089 Other allergic rhinitis: Secondary | ICD-10-CM | POA: Diagnosis not present

## 2017-11-03 MED ORDER — EPINEPHRINE 0.3 MG/0.3ML IJ SOAJ: 0.3000 mg | Freq: Once | INTRAMUSCULAR | 2 refills | Status: AC

## 2017-11-03 NOTE — Progress Notes (Signed)
FOLLOW UP  Date of Service/Encounter:  11/03/17   Assessment:   Moderate persistent asthma without complication  Perennial allergic rhinitis  (cockroach, molds, dust mites) - interested in starting allergy shots   Asthma Reportables:  Severity: moderate persistent  Risk: low Control: well controlled   Plan/Recommendations:   1. Moderate persistent asthma, uncomplicated - Lung function looked great today. - Samples of Symbicort provided.  - Daily controller medication(s): Singulair 69m daily + Symbicort 80/4.5 two puffs twice daily with spacer - Rescue medications: ProAir 4 puffs every 4-6 hours as needed - Asthma control goals:  * Full participation in all de r less a week on average (not counting use with activity) * Cough interfering with sleep two time or less a month * Oral steroids no more than once a year * No hospitalizations  2. Chronic perennial rhinitis (cockroach, dust mite, molds) - We will get blood work to check on any new allergies. - We may consider doing skin allergy testing if this is not revealing.  - Make an appointment after your JAngolatrip to start allergy shots.  - Allergy consent signed today and EpiPen sent in.  - Continue with nasal saline spray twice daily. - Continue with azelastine nasal spray two sprays per nostril 1-2 times daily as needed.  - Continue with fluticasone nasal spray two sprays per nostril 1-2 times daily as needed.  3. Return in about 6 months (around 05/05/2018).  Subjective:   Brianna PERRENis a 62y.o. female presenting today for follow up of  Chief Complaint  Patient presents with  . Asthma  . Allergic Rhinitis     Brianna DYKEShas a history of the following: Patient Active Problem List   Diagnosis Date Noted  . Acute sinusitis 12/20/2016  . Perennial allergic rhinitis 12/20/2016  . Cough 05/10/2016  . Acute left-sided thoracic back pain 04/05/2016  . Routine general medical examination at a  health care facility 05/02/2015  . Thyroid mass 01/16/2015  . Drug allergy 07/30/2014  . [place]/18[place]  . Osteopenia 09/11/2013  . Hyperthyroidism   . GERD 11/02/2007  . Moderate persistent asthma without complication [id number]   History obtained from: chart review and patient.  DDerek JackPrimary Care Provider is BKristen Loader FNP.     DChazis a 62y.o. female presenting for a follow up visit. We last saw her in December 2018. At that time, her lung function looked great. We continued her on Singulair 115mdaily with Symbicort 160/4.5 two puffs BID. For her perennial rhinitis, we continued her on nasal saline rinses twice daily, azelastine two sprays per nostril 1-2 times daily, and fluticasone two sprays per nostril 1-2 times daily. We did discuss allergy shots.   Since the last visit, she has done well. She remains on the Symbicort two puffs in the morning. She has been trying to use it twice daily but she has actually been doing well on once daily so she has continued to be doing this. Otherwise, Brianna Santana's asthma has been well controlled. She has not required rescue medication, experienced nocturnal awakenings due to lower respiratory symptoms, nor have activities of daily living been limited. She has required no Emergency Department or Urgent Care visits for her asthma. She has required zero courses of systemic steroids for asthma exacerbations since the last visit. ACT score today is 19, indicating excellent asthma symptom control.   Her rhinitis symptoms continue to be a problem, but  she is not great about taking her nasal sprays. She is interested in starting allergy shots, and she was under the impression that we were getting approval from her insurance company. Brianna Santana gets her care through the [place], however, which readily covers allergy shots so approval would not have been necessary. In any case, she is open to continuing with the process. We did discuss  allergy shots in detail today.   She has a history of Crohn's disease and is followed by Dr. Kennedy Bucker. She remains on Humira 4m every two weeks. Her most recent Quantiferon Gold was negative. She was on 6-MP for a time, but was able to wean off of this with the addition of the Humira.   Otherwise, there have been no changes to her past medical history, surgical history, family history, or social history.    Review of Systems: a 14-point review of systems is pertinent for what is mentioned in HPI.  Otherwise, all other systems were negative. Constitutional: negative other than that listed in the HPI Eyes: negative other than that listed in the HPI Ears, nose, mouth, throat, and face: negative other than that listed in the HPI Respiratory: negative other than that listed in the HPI Cardiovascular: negative other than that listed in the HPI Gastrointestinal: negative other than that listed in the HPI Genitourinary: negative other than that listed in the HPI Integument: negative other than that listed in the HPI Hematologic: negative other than that listed in the HPI Musculoskeletal: negative other than that listed in the HPI Neurological: negative other than that listed in the HPI Allergy/Immunologic: negative other than that listed in the HPI    Objective:   Blood pressure 124/74, pulse 80, temperature 97.8 F (36.6 C), temperature source Oral, resp. rate 18, SpO2 96 %. There is no height or weight on file to calculate BMI.   Physical Exam:  General: Alert, interactive, in no acute distress. Pleasant female. Very smiley and laughing today.  Eyes: No conjunctival injection bilaterally, no discharge on the right, no discharge on the left and no Horner-Trantas dots present. PERRL bilaterally. EOMI without pain. No photophobia.  Ears: Right TM pearly gray with normal light reflex, Left TM pearly gray with normal light reflex, Right TM intact without perforation and Left TM intact  without perforation.  Nose/Throat: External nose within normal limits, nasal crease present and septum midline. Turbinates edematous and pale with clear discharge. Posterior oropharynx erythematous with cobblestoning in the posterior oropharynx. Tonsils 2+ with exudates.  Tongue without thrush. Lungs: Clear to auscultation without wheezing, rhonchi or rales. No increased work of breathing. CV: Normal S1/S2. No murmurs. Capillary refill <2 seconds.  Skin: Warm and dry, without lesions or rashes. Neuro:   Grossly intact. No focal deficits appreciated. Responsive to questions.  Diagnostic studies:   Spirometry: results normal (FEV1: 2.28/111%, FVC: 2.86/109%, FEV1/FVC: 80%).    Spirometry consistent with normal pattern.   Allergy Studies: none      JSalvatore Marvel MD  [facility]

## 2017-11-03 NOTE — Patient Instructions (Addendum)
1. Moderate persistent asthma, uncomplicated - Lung function looked great today. - Samples of Symbicort provided.  - Daily controller medication(s): Singulair 75m daily + Symbicort 80/4.5 two puffs twice daily with spacer - Rescue medications: ProAir 4 puffs every 4-6 hours as needed - Asthma control goals:  * Full participation in all de r less a week on average (not counting use with activity) * Cough interfering with sleep two time or less a month * Oral steroids no more than once a year * No hospitalizations  2. Chronic perennial rhinitis (cockroach, dust mite, molds) - We will get blood work to check on any new allergies. - We may consider doing skin allergy testing if this is not revealing.  - Make an appointment after your JAngolatrip to start allergy shots.  - Continue with nasal saline spray twice daily. - Continue with azelastine nasal spray two sprays per nostril 1-2 times daily as needed.  - Continue with fluticasone nasal spray two sprays per nostril 1-2 times daily as needed.  3. Return in about 6 months (around 05/05/2018).   Please inform [place] any Emergency Department visits, hospitalizations, or changes in symptoms. Call u[place]efore going to the ED for breathing or allergy symptoms since we might be able to fit you in for a sick visit. Feel free to contact u[place]anytime with any questions, problems, or concerns.  It was a pleasure to see you again today! Have fun in [place]!   Websites that have reliable patient information: 1. American Academy of Asthma, Allergy, and Immunology: www.aaaai.org 2. Food Allergy Research and Education (FARE): foodallergy.org 3. Mothers of Asthmatics: http://www.asthmacommunitynetwork.org 4. American College of Allergy, Asthma, and Immunology: wMonthlyElectricBill.co.uk  Make sure you are registered to vote!    Allergy Shots   Allergies are the result of a chain reaction that starts in the immune system. Your immune system controls how your  body defends itself. For instance, if you have an allergy to pollen, your immune system identifies pollen as an invader or allergen. Your immune system overreacts by producing antibodies called Immunoglobulin E (IgE). These antibodies travel to cells that release chemicals, causing an allergic reaction.  The concept behind allergy immunotherapy, whether it is received in the form of shots or tablets, is that the immune system can be desensitized to specific allergens that trigger allergy symptoms. Although it requires time and patience, the payback can be long-term relief.  How Do Allergy Shots Work?  Allergy shots work much like a vaccine. Your body responds to injected amounts of a particular allergen given in increasing doses, eventually developing a resistance and tolerance to it. Allergy shots can lead to decreased, minimal or no allergy symptoms.  There generally are two phases: build-up and maintenance. Build-up often ranges from three to six months and involves receiving injections with increasing amounts of the allergens. The shots are typically given once or twice a week, though more rapid build-up schedules are sometimes used.  The maintenance phase begins when the most effective dose is reached. This dose is different for each person, depending on how allergic you are and your response to the build-up injections. Once the maintenance dose is reached, there are longer periods between injections, typically two to four weeks.  Occasionally doctors give cortisone-type shots that can temporarily reduce allergy symptoms. These types of shots are different and should not be confused with allergy immunotherapy shots.  Who Can Be Treated with Allergy Shots?  Allergy shots may be a good treatment  approach for people with allergic rhinitis (hay fever), allergic asthma, conjunctivitis (eye allergy) or stinging insect allergy.   Before deciding to begin allergy shots, you should consider:  . The  length of allergy season and the severity of your symptoms . Whether medications and/or changes to your environment can control your symptoms . Your desire to avoid long-term medication use . Time: allergy immunotherapy requires a major time commitment . Cost: may vary depending on your insurance coverage  Allergy shots for children age 19 and older are effective and often well tolerated. They might prevent the onset of new allergen sensitivities or the progression to asthma.  Allergy shots are not started on patients who are pregnant but can be continued on patients who become pregnant while receiving them. In some patients with other medical conditions or who take certain common medications, allergy shots may be of risk. It is important to mention other medications you talk to your allergist.   When Will I Feel Better?  Some may experience decreased allergy symptoms during the build-up phase. For others, it may take as long as 12 months on the maintenance dose. If there is no improvement after a year of maintenance, your allergist will discuss other treatment options with you.  If you aren't responding to allergy shots, it may be because there is not enough dose of the allergen in your vaccine or there are missing allergens that were not identified during your allergy testing. Other reasons could be that there are high levels of the allergen in your environment or major exposure to non-allergic triggers like tobacco smoke.  What Is the Length of Treatment?  Once the maintenance dose is reached, allergy shots are generally continued for three to five years. The decision to stop should be discussed with your allergist at that time. Some people may experience a permanent reduction of allergy symptoms. Others may relapse and a longer course of allergy shots can be considered.  What Are the Possible Reactions?  The two types of adverse reactions that can occur with allergy shots are local and  systemic. Common local reactions include very mild redness and swelling at the injection site, which can happen immediately or several hours after. A systemic reaction, which is less common, affects the entire body or a particular body system. They are usually mild and typically respond quickly to medications. Signs include increased allergy symptoms such as sneezing, a stuffy nose or hives.  Rarely, a serious systemic reaction called anaphylaxis can develop. Symptoms include swelling in the throat, wheezing, a feeling of tightness in the chest, nausea or dizziness. Most serious systemic reactions develop within 30 minutes of allergy shots. This is why it is strongly recommended you wait in your doctor's office for 30 minutes after your injections. Your allergist is trained to watch for reactions, and his or her staff is trained and equipped with the proper medications to identify and treat them.  Who Should Administer Allergy Shots?  The preferred location for receiving shots is your prescribing allergist's office. Injections can sometimes be given at another facility where the physician and staff are trained to recognize and treat reactions, and have received instructions by your prescribing allergist.

## 2017-11-09 ENCOUNTER — Ambulatory Visit (INDEPENDENT_AMBULATORY_CARE_PROVIDER_SITE_OTHER): Admitting: Women's Health

## 2017-11-09 ENCOUNTER — Encounter: Payer: Self-pay | Admitting: Women's Health

## 2017-11-09 VITALS — BP 122/80 | Ht 64.0 in | Wt 172.0 lb

## 2017-11-09 DIAGNOSIS — Z01419 Encounter for gynecological examination (general) (routine) without abnormal findings: Secondary | ICD-10-CM

## 2017-11-09 LAB — IGE+ALLERGENS ZONE 2(30)

## 2017-11-09 NOTE — Progress Notes (Signed)
VIALS EXP 11-10-18

## 2017-11-09 NOTE — Addendum Note (Signed)
Addended by: Valentina Shaggy on: 11/09/2017 11:00 AM   Modules accepted: Orders

## 2017-11-09 NOTE — Progress Notes (Signed)
KATRINNA TRAVIESO 04/06/1956 [id number]   History:  62 y.o. MBF G3P3 presents for an annual exam with no complaints. 2002 TVH for fibroids. Normal Pap and Mammogram history. Sexually active in a monogamous relationship with husband. 08/2013 colonoscopy with benign polyp 5 year follow-up next year. Crohn's disease and RA long-term history of steroid use. 06/18 DEXA Tscore -2.4 at [facility], -1.6 at right femoral neck, -1.3 total bilateral hip average, FRAX 3%/0.2%. Fractured her 3rd metatarsal in March. Has had Pnemovax and Zostavax.  Past medical history, past surgical history, family history and social history were all reviewed and documented in the EPIC chart. 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 Has 3 children that are all doing well. Husband has a history of prostate cancer, and radiation treatment. Does water aerobics, and walks  for exercise.   ROS:  A ROS was performed and pertinent positives and negatives are included.  Exam:  Vitals:   11/09/17 1059  BP: 122/80  Weight: 172 lb (78 kg)  Height: 5' 4"  (1.626 m)   Body mass index is 29.52 kg/m.   General appearance:  Normal Thyroid:  Symmetrical, normal in size, without palpable masses or nodularity. Respiratory  Auscultation:  Clear without wheezing or rhonchi Cardiovascular  Auscultation:  Regular rate, without rubs, murmurs or gallops  Edema/varicosities:  Not grossly evident Abdominal  Soft,nontender, without masses, guarding or rebound.  Liver/spleen:  No organomegaly noted  Hernia:  None appreciated  Skin  Inspection:  Grossly normal   Breasts: Examined lying and sitting.     Right: Without masses, retractions, discharge or axillary adenopathy.     Left: Without masses, retractions, discharge or axillary adenopathy. Gentitourinary   Inguinal/mons:  Normal without inguinal adenopathy  External genitalia:  Normal  BUS/Urethra/Skene's glands:  Normal  Vagina:  Atrophic  Cervix:  Absent  Adnexa/parametria:      Rt: Without masses or tenderness.   Lt: Without masses or tenderness.  Anus and perineum: Normal  Assessment/Plan:  62 y.o. MBF G3P3 presents for an annual exam with no complaints.     2002 TVH for fibroids no HRT/ vaginal atrophy Osteopenia without elevated FRAX Benign  colon polyps 2015. Follow-up 2020 Crohn's-GI manages RA rheumatologist  labs at primary care Benign thyroid nodules/hyperthyroid-endocrinologist manages  1) Home safety, fall prevention and importance of continuing weightbearing exercise. 2) SBE's, continue annual screening mammogram,  3) Calcium rich/low carb/low calorie diet, vitamin D 2000 daily, MVI encouraged. Normal Pap history, new screening guidelines reviewed. 4) Encouraged patient to continue leisure activities. 5) Encouraged patient to use lubrication if needed for vaginal dryness, and to take more time to relax before sex.     Crocker, 11:41 AM 11/09/2017

## 2017-11-11 ENCOUNTER — Telehealth: Payer: Self-pay | Admitting: Gastroenterology

## 2017-11-11 ENCOUNTER — Telehealth: Payer: Self-pay | Admitting: *Deleted

## 2017-11-11 MED ORDER — ADALIMUMAB 40 MG/0.8ML ~~LOC~~ AJKT: 40.0000 mg | AUTO-INJECTOR | SUBCUTANEOUS | 0 refills | Status: DC

## 2017-11-11 NOTE — Telephone Encounter (Signed)
Prescription sent to patient's mail order pharmacy. Patient scheduled f/u visit with Dr. Fuller Plan on 01/26/18 at 9:00am. Patient notified and verbalized understanding.

## 2017-11-11 NOTE — Telephone Encounter (Signed)
The patient called and I spoke with her about her test results for the allergy zone 2 and that Dr. Ernst Bowler has advised her to start immunotherapy, she already has an appointment set up to begin injections.

## 2017-11-11 NOTE — Telephone Encounter (Signed)
Patient states she needs a refill on her humira pen sent through [facility].

## 2017-11-14 DIAGNOSIS — J3089 Other allergic rhinitis: Secondary | ICD-10-CM | POA: Diagnosis not present

## 2017-12-05 ENCOUNTER — Telehealth: Payer: Self-pay | Admitting: Allergy & Immunology

## 2017-12-05 ENCOUNTER — Ambulatory Visit (INDEPENDENT_AMBULATORY_CARE_PROVIDER_SITE_OTHER)

## 2017-12-05 DIAGNOSIS — J309 Allergic rhinitis, unspecified: Secondary | ICD-10-CM | POA: Diagnosis not present

## 2017-12-05 MED ORDER — MONTELUKAST SODIUM 10 MG PO TABS: 10.0000 mg | ORAL_TABLET | Freq: Every day | ORAL | 1 refills | Status: DC

## 2017-12-05 NOTE — Telephone Encounter (Signed)
Pt came up to me and needs a refill on her Montelukast. [facility] [phone].

## 2017-12-05 NOTE — Telephone Encounter (Signed)
Left detailed message advising of refill being sent.

## 2017-12-05 NOTE — Telephone Encounter (Signed)
Refill has been sent in.

## 2017-12-07 ENCOUNTER — Ambulatory Visit (INDEPENDENT_AMBULATORY_CARE_PROVIDER_SITE_OTHER): Admitting: *Deleted

## 2017-12-07 DIAGNOSIS — J309 Allergic rhinitis, unspecified: Secondary | ICD-10-CM | POA: Diagnosis not present

## 2017-12-12 ENCOUNTER — Ambulatory Visit (INDEPENDENT_AMBULATORY_CARE_PROVIDER_SITE_OTHER): Admitting: *Deleted

## 2017-12-12 DIAGNOSIS — J309 Allergic rhinitis, unspecified: Secondary | ICD-10-CM

## 2017-12-14 ENCOUNTER — Ambulatory Visit (INDEPENDENT_AMBULATORY_CARE_PROVIDER_SITE_OTHER)

## 2017-12-14 DIAGNOSIS — J309 Allergic rhinitis, unspecified: Secondary | ICD-10-CM | POA: Diagnosis not present

## 2017-12-21 ENCOUNTER — Ambulatory Visit (INDEPENDENT_AMBULATORY_CARE_PROVIDER_SITE_OTHER): Admitting: *Deleted

## 2017-12-21 DIAGNOSIS — J309 Allergic rhinitis, unspecified: Secondary | ICD-10-CM

## 2017-12-23 ENCOUNTER — Ambulatory Visit (INDEPENDENT_AMBULATORY_CARE_PROVIDER_SITE_OTHER)

## 2017-12-23 DIAGNOSIS — J309 Allergic rhinitis, unspecified: Secondary | ICD-10-CM | POA: Diagnosis not present

## 2017-12-26 ENCOUNTER — Ambulatory Visit (INDEPENDENT_AMBULATORY_CARE_PROVIDER_SITE_OTHER)

## 2017-12-26 DIAGNOSIS — J309 Allergic rhinitis, unspecified: Secondary | ICD-10-CM | POA: Diagnosis not present

## 2017-12-28 ENCOUNTER — Ambulatory Visit (INDEPENDENT_AMBULATORY_CARE_PROVIDER_SITE_OTHER): Admitting: *Deleted

## 2017-12-28 DIAGNOSIS — J309 Allergic rhinitis, unspecified: Secondary | ICD-10-CM

## 2018-01-05 ENCOUNTER — Ambulatory Visit (INDEPENDENT_AMBULATORY_CARE_PROVIDER_SITE_OTHER): Admitting: *Deleted

## 2018-01-05 DIAGNOSIS — J309 Allergic rhinitis, unspecified: Secondary | ICD-10-CM | POA: Diagnosis not present

## 2018-01-09 ENCOUNTER — Ambulatory Visit (INDEPENDENT_AMBULATORY_CARE_PROVIDER_SITE_OTHER)

## 2018-01-09 DIAGNOSIS — J309 Allergic rhinitis, unspecified: Secondary | ICD-10-CM | POA: Diagnosis not present

## 2018-01-11 ENCOUNTER — Ambulatory Visit (INDEPENDENT_AMBULATORY_CARE_PROVIDER_SITE_OTHER): Admitting: *Deleted

## 2018-01-11 DIAGNOSIS — J309 Allergic rhinitis, unspecified: Secondary | ICD-10-CM

## 2018-01-16 ENCOUNTER — Ambulatory Visit (INDEPENDENT_AMBULATORY_CARE_PROVIDER_SITE_OTHER): Admitting: *Deleted

## 2018-01-16 DIAGNOSIS — J309 Allergic rhinitis, unspecified: Secondary | ICD-10-CM | POA: Diagnosis not present

## 2018-01-17 ENCOUNTER — Ambulatory Visit: Admitting: Endocrinology

## 2018-01-18 ENCOUNTER — Ambulatory Visit (INDEPENDENT_AMBULATORY_CARE_PROVIDER_SITE_OTHER): Admitting: *Deleted

## 2018-01-18 DIAGNOSIS — J309 Allergic rhinitis, unspecified: Secondary | ICD-10-CM | POA: Diagnosis not present

## 2018-01-23 ENCOUNTER — Ambulatory Visit (INDEPENDENT_AMBULATORY_CARE_PROVIDER_SITE_OTHER): Admitting: *Deleted

## 2018-01-23 DIAGNOSIS — J309 Allergic rhinitis, unspecified: Secondary | ICD-10-CM | POA: Diagnosis not present

## 2018-01-25 ENCOUNTER — Ambulatory Visit (INDEPENDENT_AMBULATORY_CARE_PROVIDER_SITE_OTHER)

## 2018-01-25 DIAGNOSIS — J309 Allergic rhinitis, unspecified: Secondary | ICD-10-CM

## 2018-01-26 ENCOUNTER — Ambulatory Visit (INDEPENDENT_AMBULATORY_CARE_PROVIDER_SITE_OTHER): Admitting: Gastroenterology

## 2018-01-26 ENCOUNTER — Other Ambulatory Visit (INDEPENDENT_AMBULATORY_CARE_PROVIDER_SITE_OTHER)

## 2018-01-26 ENCOUNTER — Encounter: Payer: Self-pay | Admitting: Gastroenterology

## 2018-01-26 VITALS — BP 124/70 | HR 74 | Ht 64.0 in | Wt 177.2 lb

## 2018-01-26 DIAGNOSIS — K5010 Crohn's disease of large intestine without complications: Secondary | ICD-10-CM

## 2018-01-26 DIAGNOSIS — K219 Gastro-esophageal reflux disease without esophagitis: Secondary | ICD-10-CM | POA: Diagnosis not present

## 2018-01-26 LAB — COMPREHENSIVE METABOLIC PANEL
ALT: 16 U/L (ref 0–35)
AST: 17 U/L (ref 0–37)
Albumin: 4.4 g/dL (ref 3.5–5.2)
Alkaline Phosphatase: 81 U/L (ref 39–117)
BUN: 11 mg/dL (ref 6–23)
CO2: 31 mEq/L (ref 19–32)
Calcium: 9.7 mg/dL (ref 8.4–10.5)
Chloride: 104 mEq/L (ref 96–112)
Creatinine, Ser: 0.82 mg/dL (ref 0.40–1.20)
GFR: 90.74 mL/min (ref >60.00)
Glucose, Bld: 88 mg/dL (ref 70–99)
Potassium: 4.4 mEq/L (ref 3.5–5.1)
Sodium: 141 mEq/L (ref 135–145)
Total Bilirubin: 0.8 mg/dL (ref 0.2–1.2)
Total Protein: 7.4 g/dL (ref 6.0–8.3)

## 2018-01-26 LAB — CBC WITH DIFFERENTIAL/PLATELET
Basophils Absolute: 0.1 10*3/uL (ref 0.0–0.1)
Basophils Relative: 2.3 % (ref 0.0–3.0)
Eosinophils Absolute: 0.3 10*3/uL (ref 0.0–0.7)
Eosinophils Relative: 9.3 % — ABNORMAL HIGH (ref 0.0–5.0)
HCT: 38.5 % (ref 36.0–46.0)
Hemoglobin: 13 g/dL (ref 12.0–15.0)
Lymphocytes Relative: 45.1 % (ref 12.0–46.0)
Lymphs Abs: 1.5 10*3/uL (ref 0.7–4.0)
MCHC: 33.6 g/dL (ref 30.0–36.0)
MCV: 89.9 fl (ref 78.0–100.0)
Monocytes Absolute: 0.4 10*3/uL (ref 0.1–1.0)
Monocytes Relative: 13.2 % — ABNORMAL HIGH (ref 3.0–12.0)
Neutro Abs: 1 10*3/uL — ABNORMAL LOW (ref 1.4–7.7)
Neutrophils Relative %: 30.1 % — ABNORMAL LOW (ref 43.0–77.0)
Platelets: 296 10*3/uL (ref 150.0–400.0)
RBC: 4.28 Mil/uL (ref 3.87–5.11)
RDW: 12.7 % (ref 11.5–15.5)
WBC: 3.3 10*3/uL — ABNORMAL LOW (ref 4.0–10.5)

## 2018-01-26 MED ORDER — ADALIMUMAB 40 MG/0.8ML ~~LOC~~ AJKT: 40.0000 mg | AUTO-INJECTOR | SUBCUTANEOUS | 0 refills | Status: DC

## 2018-01-26 MED ORDER — OMEPRAZOLE 40 MG PO CPDR: DELAYED_RELEASE_CAPSULE | ORAL | 3 refills | Status: DC

## 2018-01-26 NOTE — Progress Notes (Signed)
    History of Present Illness: This is a 62 year old female Crohn's colitis and GERD.  All symptoms are well controlled on current medications.  She is not having any difficulties with Humira injections.  She has no gastrointestinal complaints.  QuantiFERON gold and HBsAg negative in December 2018.   Current Medications, Allergies, Past Medical History, Past Surgical History, Family History and Social History were reviewed in [employer] record.  Physical Exam: General: Well developed, well nourished, no acute distress Head: Normocephalic and atraumatic Eyes:  sclerae anicteric, EOMI Ears: Normal auditory acuity Mouth: No deformity or lesions Lungs: Clear throughout to auscultation Heart: Regular rate and rhythm; no murmurs, rubs or bruits Abdomen: Soft, non tender and non distended. No masses, hepatosplenomegaly or hernias noted. Normal Bowel sounds Rectal: Not done  Musculoskeletal: Symmetrical with no gross deformities  Pulses:  Normal pulses noted Extremities: No clubbing, cyanosis, edema or deformities noted Neurological: Alert oriented x 4, grossly nonfocal Psychological:  Alert and cooperative. Normal mood and affect  Assessment and Recommendations:  1. Crohn's colitis, asymptomatic. Colonoscopy in 08/2018. CMET, CBC today.  Continue Humira 40 mg injection every 14 days. Colonoscopy recommended in April 2020.  2. GERD.  Follow standard antireflux measures and continue omeprazole 40 mg daily.  I spent 15 minutes of face-to-face time with the patient. Greater than 50% of the time was spent counseling and coordinating care.

## 2018-01-26 NOTE — Patient Instructions (Signed)
Your provider has requested that you go to the basement level for lab work before leaving today. Press "B" on the elevator. The lab is located at the first door on the left as you exit the elevator.  We have sent the following prescriptions to your mail in pharmacy:omeprazole and Humira.    If you have not heard from your mail in pharmacy within 1 week or if you have not received your medication in the mail, please contact us at [phone] so we may find out why.  Normal BMI (Body Mass Index- based on height and weight) is between 19 and 25. Your BMI today is Body mass index is 30.42 kg/m. Marland Kitchen Please consider follow up  regarding your BMI with your Primary Care Provider.  Thank you for choosing me and [facility].  Pricilla Riffle. Dagoberto Ligas., MD., Marval Regal

## 2018-01-31 ENCOUNTER — Encounter: Payer: Self-pay | Admitting: Women's Health

## 2018-01-31 ENCOUNTER — Ambulatory Visit (INDEPENDENT_AMBULATORY_CARE_PROVIDER_SITE_OTHER)

## 2018-01-31 ENCOUNTER — Ambulatory Visit (INDEPENDENT_AMBULATORY_CARE_PROVIDER_SITE_OTHER): Admitting: Women's Health

## 2018-01-31 VITALS — BP 134/80

## 2018-01-31 DIAGNOSIS — J309 Allergic rhinitis, unspecified: Secondary | ICD-10-CM

## 2018-01-31 DIAGNOSIS — N898 Other specified noninflammatory disorders of vagina: Secondary | ICD-10-CM | POA: Diagnosis not present

## 2018-01-31 DIAGNOSIS — B373 Candidiasis of vulva and vagina: Secondary | ICD-10-CM | POA: Diagnosis not present

## 2018-01-31 DIAGNOSIS — B3731 Acute candidiasis of vulva and vagina: Secondary | ICD-10-CM

## 2018-01-31 LAB — WET PREP FOR TRICH, YEAST, CLUE

## 2018-01-31 MED ORDER — TERCONAZOLE 0.4 % VA CREA: 1.0000 | TOPICAL_CREAM | Freq: Every day | VAGINAL | 0 refills | Status: DC

## 2018-01-31 NOTE — Progress Notes (Signed)
62 year old MBF G3, P3 presents with complaint of vaginal irritation with itching for the past 2 to 3 weeks.  Has used over-the-counter A&D ointment, antibiotic cream with minimal relief.  Has slight burning at initiation of urination, no pain at end of stream, suprapubic pain, or increased frequency.  Denies abdominal pain, changes in GI, nausea or fever.  2002 TVH for fibroids on no HRT.  Same partner but questions his fidelity.  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  Exam: Appears well.  No CVAT.  Abdomen soft without rebound or pain.  External genitalia within normal limits, slight erythema at introitus, vaginal walls atrophic, no visible discharge, no odor noted, wet prep negative.  Probable external yeast vaginitis  Plan: Terazol 7 apply small amount at bedtime for 1 week, loose clothing, instructed to call if no relief.  GC/Chlamydia from urine pending.  Denies need for HIV, hepatitis or RPR.

## 2018-01-31 NOTE — Patient Instructions (Signed)

## 2018-02-01 LAB — C. TRACHOMATIS/N. GONORRHOEAE RNA
C. trachomatis RNA, TMA: NOT DETECTED
N. gonorrhoeae RNA, TMA: NOT DETECTED

## 2018-02-02 ENCOUNTER — Ambulatory Visit (INDEPENDENT_AMBULATORY_CARE_PROVIDER_SITE_OTHER): Admitting: *Deleted

## 2018-02-02 DIAGNOSIS — J309 Allergic rhinitis, unspecified: Secondary | ICD-10-CM

## 2018-02-06 ENCOUNTER — Ambulatory Visit (INDEPENDENT_AMBULATORY_CARE_PROVIDER_SITE_OTHER): Admitting: *Deleted

## 2018-02-06 DIAGNOSIS — J309 Allergic rhinitis, unspecified: Secondary | ICD-10-CM | POA: Diagnosis not present

## 2018-02-08 ENCOUNTER — Ambulatory Visit (INDEPENDENT_AMBULATORY_CARE_PROVIDER_SITE_OTHER): Admitting: *Deleted

## 2018-02-08 DIAGNOSIS — J309 Allergic rhinitis, unspecified: Secondary | ICD-10-CM

## 2018-02-13 ENCOUNTER — Ambulatory Visit (INDEPENDENT_AMBULATORY_CARE_PROVIDER_SITE_OTHER): Admitting: *Deleted

## 2018-02-13 DIAGNOSIS — J309 Allergic rhinitis, unspecified: Secondary | ICD-10-CM | POA: Diagnosis not present

## 2018-02-16 ENCOUNTER — Ambulatory Visit (INDEPENDENT_AMBULATORY_CARE_PROVIDER_SITE_OTHER): Admitting: *Deleted

## 2018-02-16 DIAGNOSIS — J309 Allergic rhinitis, unspecified: Secondary | ICD-10-CM

## 2018-02-22 ENCOUNTER — Ambulatory Visit (INDEPENDENT_AMBULATORY_CARE_PROVIDER_SITE_OTHER)

## 2018-02-22 DIAGNOSIS — J309 Allergic rhinitis, unspecified: Secondary | ICD-10-CM

## 2018-03-02 ENCOUNTER — Ambulatory Visit (INDEPENDENT_AMBULATORY_CARE_PROVIDER_SITE_OTHER): Admitting: *Deleted

## 2018-03-02 DIAGNOSIS — J309 Allergic rhinitis, unspecified: Secondary | ICD-10-CM

## 2018-03-13 ENCOUNTER — Ambulatory Visit (INDEPENDENT_AMBULATORY_CARE_PROVIDER_SITE_OTHER): Admitting: *Deleted

## 2018-03-13 DIAGNOSIS — J309 Allergic rhinitis, unspecified: Secondary | ICD-10-CM

## 2018-03-20 ENCOUNTER — Ambulatory Visit (INDEPENDENT_AMBULATORY_CARE_PROVIDER_SITE_OTHER)

## 2018-03-20 DIAGNOSIS — J309 Allergic rhinitis, unspecified: Secondary | ICD-10-CM | POA: Diagnosis not present

## 2018-03-28 ENCOUNTER — Ambulatory Visit (INDEPENDENT_AMBULATORY_CARE_PROVIDER_SITE_OTHER): Admitting: *Deleted

## 2018-03-28 DIAGNOSIS — J309 Allergic rhinitis, unspecified: Secondary | ICD-10-CM

## 2018-03-28 NOTE — Progress Notes (Signed)
Vial exp 03-29-19

## 2018-03-29 DIAGNOSIS — J301 Allergic rhinitis due to pollen: Secondary | ICD-10-CM | POA: Diagnosis not present

## 2018-04-03 ENCOUNTER — Ambulatory Visit (INDEPENDENT_AMBULATORY_CARE_PROVIDER_SITE_OTHER): Admitting: *Deleted

## 2018-04-03 DIAGNOSIS — J309 Allergic rhinitis, unspecified: Secondary | ICD-10-CM | POA: Diagnosis not present

## 2018-04-10 ENCOUNTER — Ambulatory Visit (INDEPENDENT_AMBULATORY_CARE_PROVIDER_SITE_OTHER): Admitting: *Deleted

## 2018-04-10 DIAGNOSIS — J309 Allergic rhinitis, unspecified: Secondary | ICD-10-CM

## 2018-04-19 ENCOUNTER — Ambulatory Visit (INDEPENDENT_AMBULATORY_CARE_PROVIDER_SITE_OTHER): Admitting: *Deleted

## 2018-04-19 DIAGNOSIS — J309 Allergic rhinitis, unspecified: Secondary | ICD-10-CM

## 2018-05-02 ENCOUNTER — Ambulatory Visit: Payer: Self-pay | Admitting: *Deleted

## 2018-05-02 ENCOUNTER — Ambulatory Visit (INDEPENDENT_AMBULATORY_CARE_PROVIDER_SITE_OTHER): Admitting: Allergy & Immunology

## 2018-05-02 ENCOUNTER — Telehealth: Payer: Self-pay | Admitting: Gastroenterology

## 2018-05-02 ENCOUNTER — Encounter: Payer: Self-pay | Admitting: Allergy & Immunology

## 2018-05-02 VITALS — BP 108/70 | HR 88 | Temp 97.9°F | Resp 16 | Ht 64.0 in | Wt 176.4 lb

## 2018-05-02 DIAGNOSIS — J4540 Moderate persistent asthma, uncomplicated: Secondary | ICD-10-CM | POA: Diagnosis not present

## 2018-05-02 DIAGNOSIS — J3089 Other allergic rhinitis: Secondary | ICD-10-CM

## 2018-05-02 MED ORDER — ADALIMUMAB 40 MG/0.8ML ~~LOC~~ AJKT: 40.0000 mg | AUTO-INJECTOR | SUBCUTANEOUS | 1 refills | Status: DC

## 2018-05-02 MED ORDER — MONTELUKAST SODIUM 10 MG PO TABS: 10.0000 mg | ORAL_TABLET | Freq: Every day | ORAL | 1 refills | Status: DC

## 2018-05-02 NOTE — Patient Instructions (Addendum)
1. Moderate persistent asthma, uncomplicated - Lung function looked great today.   - We will not make any medication changes at this time.  - Daily controller medication(s): Singulair 3m daily + Symbicort 80/4.5 two puffs twice daily with spacer  - Rescue medications: ProAir 4 puffs every 4-6 hours as needed - Asthma control goals:  * Full participation in all de r less a week on average (not counting use with activity) * Cough interfering with sleep two time or less a month * Oral steroids no more than once a year * No hospitalizations  2. Chronic perennial rhinitis (cockroach, dust mite, molds) - Continue with allergy shots at the same schedule.  - Continue with nasal saline spray twice daily. - Continue with azelastine nasal spray two sprays per nostril 1-2 times daily as needed.  - Continue with fluticasone nasal spray two sprays per nostril 1-2 times daily as needed.  3. Return in about 6 months (around 11/01/2018).   Please inform u[place]of any Emergency Department visits, hospitalizations, or changes in symptoms. Call u[place]efore going to the ED for breathing or allergy symptoms since we might be able to fit you in for a sick visit. Feel free to contact u[place]anytime with any questions, problems, or concerns.  It was a pleasure to see you again today!  Websites that have reliable patient information: 1. American Academy of Asthma, Allergy, and Immunology: www.aaaai.org 2. Food Allergy Research and Education (FARE): foodallergy.org 3. Mothers of Asthmatics: http://www.asthmacommunitynetwork.org 4. American College of Allergy, Asthma, and Immunology: wMonthlyElectricBill.co.uk  Make sure you are registered to vote! If you have moved or changed any of your contact information, you will need to get this updated before voting!

## 2018-05-02 NOTE — Progress Notes (Signed)
FOLLOW UP  Date of Service/Encounter:  05/02/18   Assessment:   Moderate persistent asthma without complication  Perennial allergic rhinitis(cockroach, molds, dust mites)    [name] presents for follow-up visit.  She started allergen immunotherapy shortly after the last visit, and she seems to be doing well with this.  We will keep same regimen.  I am optimistic that we can some of her medications future when she has been on maintenance for a longer period of time. Asthma is under good control with the Symbicort two puffs twice daily. We did discuss stepping down her therapy but she prefers to remain stable at this point since she has done so well on this regimen.   Plan/Recommendations:   1. Moderate persistent asthma, uncomplicated - Lung function looked great today.   - We will not make any medication changes at this time.  - Daily controller medication(s): Singulair 90m daily + Symbicort 80/4.5 two puffs twice daily with spacer  - Rescue medications: ProAir 4 puffs every 4-6 hours as needed - Asthma control goals:  * Full participation in all de r less a week on average (not counting use with activity) * Cough interfering with sleep two time or less a month * Oral steroids no more than once a year * No hospitalizations  2. Chronic perennial rhinitis (cockroach, dust mite, molds) - Continue with allergy shots at the same schedule.  - Continue with nasal saline spray twice daily. - Continue with azelastine nasal spray two sprays per nostril 1-2 times daily as needed.  - Continue with fluticasone nasal spray two sprays per nostril 1-2 times daily as needed.  3. Return in about 6 months (around 11/01/2018).  Subjective:   Brianna AZIZIis a 62y.o. female presenting today for follow up of  Chief Complaint  Patient presents with  . Follow-up    Brianna POMAhas a history of the following: Patient Active Problem List   Diagnosis Date Noted  . Acute sinusitis  12/20/2016  . Perennial allergic rhinitis 12/20/2016  . Cough 05/10/2016  . Acute left-sided thoracic back pain 04/05/2016  . Routine general medical examination at a health care facility 05/02/2015  . Thyroid mass 01/16/2015  . Drug allergy 07/30/2014  . Crohn's disease ([place] 03/18/2014  . Osteopenia 09/11/2013  . Hyperthyroidism   . GERD 11/02/2007  . Moderate persistent asthma without complication 081/19/1478   History obtained from: chart review and patient.  DDerek JackPrimary Care Provider is BKristen Loader FNP.     DAvivis a 62y.o. female presenting for a follow up visit.  She was last seen in June 2019.  At that time, her lung function looked great.  We continued her on Singulair 10 mg daily as well as Symbicort 80/4.5 mcg 2 puffs twice daily.  For her rhinitis, we continued her on Astelin as well as Flonase.  I also made the decision to start allergen immunotherapy.  She has since started allergen immunotherapy in July 2019.  Since the last visit, she is done very well.  She has tolerated the advance for allergy shots without adverse reaction.  She does feel like they are providing some relief of her symptoms.  She remains on Singulair as well as Symbicort 2 puffs twice daily.  She is not interested in making any changes since she has remained stable for so long.  However, she is paying around $100 per month for her Symbicort alone.  Evidently, her  [facility] insurance does not cover this.  This has something to do with how long her husband was in the [employer].  In any case, Brianna Santana's asthma has been well controlled. She has not required rescue medication, experienced nocturnal awakenings due to lower respiratory symptoms, nor have activities of daily living been limited. She has required no Emergency Department or Urgent Care visits for her asthma. She has required zero courses of systemic steroids for asthma exacerbations since the last visit. ACT score today is 21,  indicating excellent asthma symptom control.   [name] is on allergen immunotherapy. She receives one injection. Immunotherapy script #1 contains molds, dust mites and cockroach. She currently receives 0.32m of the RED vial (1/100). She started shots July of 2019 and reached maintenance in September of 2019. She has had no adverse side effects from the allergen immunotherapy.  Otherwise, there have been no changes to her past medical history, surgical history, family history, or social history.    Review of Systems: a 14-point review of systems is pertinent for what is mentioned in HPI.  Otherwise, all other systems were negative.  Constitutional: negative other than that listed in the HPI Eyes: negative other than that listed in the HPI Ears, nose, mouth, throat, and face: negative other than that listed in the HPI Respiratory: negative other than that listed in the HPI Cardiovascular: negative other than that listed in the HPI Gastrointestinal: negative other than that listed in the HPI Genitourinary: negative other than that listed in the HPI Integument: negative other than that listed in the HPI Hematologic: negative other than that listed in the HPI Musculoskeletal: negative other than that listed in the HPI Neurological: negative other than that listed in the HPI Allergy/Immunologic: negative other than that listed in the HPI    Objective:   Blood pressure 108/70, pulse 88, temperature 97.9 F (36.6 C), temperature source Oral, resp. rate 16, height 5' 4"  (1.626 m), weight 176 lb 6.4 oz (80 kg), SpO2 95 %. Body mass index is 30.28 kg/m.   Physical Exam:  General: Alert, interactive, in no acute distress. Talkative.  Eyes: No conjunctival injection bilaterally, no discharge on the right, no discharge on the left and no Horner-Trantas dots present. PERRL bilaterally. EOMI without pain. No photophobia.  Ears: Right TM pearly gray with normal light reflex, Left TM pearly gray  with normal light reflex, Right TM intact without perforation and Left TM intact without perforation.  Nose/Throat: External nose within normal limits and septum midline. Turbinates edematous with clear discharge. Posterior oropharynx erythematous without cobblestoning in the posterior oropharynx. Tonsils 2+ without exudates.  Tongue without thrush. Lungs: Clear to auscultation without wheezing, rhonchi or rales. No increased work of breathing. CV: Normal S1/S2. No murmurs. Capillary refill <2 seconds.  Skin: Warm and dry, without lesions or rashes. Neuro:   Grossly intact. No focal deficits appreciated. Responsive to questions.  Diagnostic studies:   Spirometry: results normal (FEV1: 2.71/103%, FVC: 73%, FEV1/FVC: 73%).    Spirometry consistent with normal pattern.   Allergy Studies: none        JSalvatore Marvel MD  [facility]

## 2018-05-02 NOTE — Telephone Encounter (Signed)
Prescription sent to patient's mail order pharmacy. Left a message notifying patient of prescription refill and that she is due for a follow up visit.

## 2018-05-08 ENCOUNTER — Ambulatory Visit (INDEPENDENT_AMBULATORY_CARE_PROVIDER_SITE_OTHER)

## 2018-05-08 DIAGNOSIS — J309 Allergic rhinitis, unspecified: Secondary | ICD-10-CM | POA: Diagnosis not present

## 2018-05-15 ENCOUNTER — Ambulatory Visit (INDEPENDENT_AMBULATORY_CARE_PROVIDER_SITE_OTHER): Admitting: *Deleted

## 2018-05-15 DIAGNOSIS — J309 Allergic rhinitis, unspecified: Secondary | ICD-10-CM

## 2018-05-17 ENCOUNTER — Encounter: Payer: Self-pay | Admitting: Gastroenterology

## 2018-05-17 NOTE — Telephone Encounter (Signed)
Error

## 2018-05-23 ENCOUNTER — Ambulatory Visit (INDEPENDENT_AMBULATORY_CARE_PROVIDER_SITE_OTHER): Admitting: *Deleted

## 2018-05-23 DIAGNOSIS — J309 Allergic rhinitis, unspecified: Secondary | ICD-10-CM

## 2018-06-05 ENCOUNTER — Ambulatory Visit (INDEPENDENT_AMBULATORY_CARE_PROVIDER_SITE_OTHER): Admitting: *Deleted

## 2018-06-05 DIAGNOSIS — J309 Allergic rhinitis, unspecified: Secondary | ICD-10-CM

## 2018-06-08 ENCOUNTER — Other Ambulatory Visit (INDEPENDENT_AMBULATORY_CARE_PROVIDER_SITE_OTHER)

## 2018-06-08 ENCOUNTER — Ambulatory Visit: Admitting: Gastroenterology

## 2018-06-08 ENCOUNTER — Encounter: Payer: Self-pay | Admitting: Gastroenterology

## 2018-06-08 VITALS — BP 116/70 | HR 74 | Ht 64.0 in | Wt 173.0 lb

## 2018-06-08 DIAGNOSIS — K219 Gastro-esophageal reflux disease without esophagitis: Secondary | ICD-10-CM | POA: Diagnosis not present

## 2018-06-08 DIAGNOSIS — K5010 Crohn's disease of large intestine without complications: Secondary | ICD-10-CM

## 2018-06-08 LAB — COMPREHENSIVE METABOLIC PANEL
ALT: 17 U/L (ref 0–35)
AST: 16 U/L (ref 0–37)
Albumin: 4.4 g/dL (ref 3.5–5.2)
Alkaline Phosphatase: 78 U/L (ref 39–117)
BUN: 14 mg/dL (ref 6–23)
CO2: 26 mEq/L (ref 19–32)
Calcium: 9.9 mg/dL (ref 8.4–10.5)
Chloride: 105 mEq/L (ref 96–112)
Creatinine, Ser: 0.74 mg/dL (ref 0.40–1.20)
GFR: 102.03 mL/min (ref >60.00)
Glucose, Bld: 84 mg/dL (ref 70–99)
Potassium: 4 mEq/L (ref 3.5–5.1)
Sodium: 140 mEq/L (ref 135–145)
Total Bilirubin: 0.7 mg/dL (ref 0.2–1.2)
Total Protein: 7.4 g/dL (ref 6.0–8.3)

## 2018-06-08 LAB — SEDIMENTATION RATE: Sed Rate: 23 mm/hr (ref 0–30)

## 2018-06-08 LAB — CBC WITH DIFFERENTIAL/PLATELET
Basophils Absolute: 0.1 10*3/uL (ref 0.0–0.1)
Basophils Relative: 2.3 % (ref 0.0–3.0)
Eosinophils Absolute: 0.3 10*3/uL (ref 0.0–0.7)
Eosinophils Relative: 8.8 % — ABNORMAL HIGH (ref 0.0–5.0)
HCT: 37.7 % (ref 36.0–46.0)
Hemoglobin: 12.8 g/dL (ref 12.0–15.0)
Lymphocytes Relative: 49 % — ABNORMAL HIGH (ref 12.0–46.0)
Lymphs Abs: 1.4 10*3/uL (ref 0.7–4.0)
MCHC: 34 g/dL (ref 30.0–36.0)
MCV: 89.3 fl (ref 78.0–100.0)
Monocytes Absolute: 0.3 10*3/uL (ref 0.1–1.0)
Monocytes Relative: 11.4 % (ref 3.0–12.0)
Neutro Abs: 0.8 10*3/uL — ABNORMAL LOW (ref 1.4–7.7)
Neutrophils Relative %: 28.5 % — ABNORMAL LOW (ref 43.0–77.0)
Platelets: 316 10*3/uL (ref 150.0–400.0)
RBC: 4.22 Mil/uL (ref 3.87–5.11)
RDW: 13.3 % (ref 11.5–15.5)
WBC: 2.9 10*3/uL — ABNORMAL LOW (ref 4.0–10.5)

## 2018-06-08 LAB — C-REACTIVE PROTEIN: CRP: 0.4 mg/dL — ABNORMAL LOW (ref 0.5–20.0)

## 2018-06-08 LAB — TSH: TSH: 2.76 u[IU]/mL (ref 0.35–4.50)

## 2018-06-08 MED ORDER — POTASSIUM CHLORIDE ER 10 MEQ PO TBCR: 10.0000 meq | EXTENDED_RELEASE_TABLET | Freq: Every day | ORAL | 1 refills | Status: DC

## 2018-06-08 NOTE — Patient Instructions (Signed)
Your provider has requested that you go to the basement level for lab work before leaving today. Press "B" on the elevator. The lab is located at the first door on the left as you exit the elevator.  We have sent the following prescriptions to your mail in pharmacy: potassium   If you have not heard from your mail in pharmacy within 1 week or if you have not received your medication in the mail, please contact us at [phone] so we may find out why.  You will be due for a recall colonoscopy in 08/2018. We will send you a reminder in the mail when it gets closer to that time.  Thank you for choosing me and [facility].  Pricilla Riffle. Dagoberto Ligas., MD., Mar[facility] Regal

## 2018-06-08 NOTE — Progress Notes (Signed)
    History of Present Illness: This is a 63 year old female with Crohn's colitis and GERD.  She is asymptomatic.  She feels well.  She states she has a new primary care physician with [facility] physicians at [facility] and her appointment is in April.  Current Medications, Allergies, Past Medical History, Past Surgical History, Family History and Social History were reviewed in [employer] record.  Physical Exam: General: Well developed, well nourished, no acute distress Head: Normocephalic and atraumatic Eyes:  sclerae anicteric, EOMI Ears: Normal auditory acuity Mouth: No deformity or lesions Lungs: Clear throughout to auscultation Heart: Regular rate and rhythm; no murmurs, rubs or bruits Abdomen: Soft, non tender and non distended. No masses, hepatosplenomegaly or hernias noted. Normal Bowel sounds Rectal: Not done Musculoskeletal: Symmetrical with no gross deformities  Pulses:  Normal pulses noted Extremities: No clubbing, cyanosis, edema or deformities noted Neurological: Alert oriented x 4, grossly nonfocal Psychological:  Alert and cooperative. Normal mood and affect   Assessment and Recommendations:  1.  Crohn's colitis.  Stable on Humira.  Annual blood work today.  Surveillance colonoscopy is due in 08/2018.  If blood work and colonoscopy results are favorable we will plan for return office visit 1 year from now.  2.  GERD.  Follow standard antireflux measures.  Continue omeprazole 40 mg daily.  3.  Refill K-Dur 10 meq until her PCP appointment in April

## 2018-06-10 LAB — QUANTIFERON-TB GOLD PLUS
Mitogen-NIL: 7.96 IU/mL
NIL: 0.03 IU/mL
QuantiFERON-TB Gold Plus: NEGATIVE
TB1-NIL: 0 IU/mL
TB2-NIL: 0.01 IU/mL

## 2018-06-10 LAB — HEPATITIS B SURFACE ANTIGEN: Hepatitis B Surface Ag: NONREACTIVE

## 2018-06-12 ENCOUNTER — Telehealth: Payer: Self-pay | Admitting: Gastroenterology

## 2018-06-12 NOTE — Telephone Encounter (Signed)
See results results for further details.

## 2018-06-12 NOTE — Telephone Encounter (Signed)
Pt is returning your call

## 2018-06-13 ENCOUNTER — Ambulatory Visit (INDEPENDENT_AMBULATORY_CARE_PROVIDER_SITE_OTHER): Admitting: *Deleted

## 2018-06-13 DIAGNOSIS — J309 Allergic rhinitis, unspecified: Secondary | ICD-10-CM

## 2018-06-19 ENCOUNTER — Ambulatory Visit (INDEPENDENT_AMBULATORY_CARE_PROVIDER_SITE_OTHER): Admitting: *Deleted

## 2018-06-19 DIAGNOSIS — J309 Allergic rhinitis, unspecified: Secondary | ICD-10-CM

## 2018-06-26 ENCOUNTER — Ambulatory Visit (INDEPENDENT_AMBULATORY_CARE_PROVIDER_SITE_OTHER)

## 2018-06-26 DIAGNOSIS — J309 Allergic rhinitis, unspecified: Secondary | ICD-10-CM

## 2018-07-03 ENCOUNTER — Ambulatory Visit (INDEPENDENT_AMBULATORY_CARE_PROVIDER_SITE_OTHER): Admitting: *Deleted

## 2018-07-03 DIAGNOSIS — J309 Allergic rhinitis, unspecified: Secondary | ICD-10-CM

## 2018-07-10 ENCOUNTER — Ambulatory Visit (INDEPENDENT_AMBULATORY_CARE_PROVIDER_SITE_OTHER): Admitting: *Deleted

## 2018-07-10 DIAGNOSIS — J309 Allergic rhinitis, unspecified: Secondary | ICD-10-CM

## 2018-07-17 ENCOUNTER — Ambulatory Visit (INDEPENDENT_AMBULATORY_CARE_PROVIDER_SITE_OTHER)

## 2018-07-17 DIAGNOSIS — J309 Allergic rhinitis, unspecified: Secondary | ICD-10-CM | POA: Diagnosis not present

## 2018-07-17 NOTE — Progress Notes (Signed)
Vial exp 07-19-2019

## 2018-07-18 ENCOUNTER — Other Ambulatory Visit: Payer: Self-pay | Admitting: Orthopaedic Surgery

## 2018-07-18 DIAGNOSIS — M5136 Other intervertebral disc degeneration, lumbar region: Secondary | ICD-10-CM

## 2018-07-19 DIAGNOSIS — J3089 Other allergic rhinitis: Secondary | ICD-10-CM | POA: Diagnosis not present

## 2018-07-25 ENCOUNTER — Ambulatory Visit (INDEPENDENT_AMBULATORY_CARE_PROVIDER_SITE_OTHER): Admitting: *Deleted

## 2018-07-25 DIAGNOSIS — J309 Allergic rhinitis, unspecified: Secondary | ICD-10-CM

## 2018-07-26 ENCOUNTER — Ambulatory Visit
Admission: RE | Admit: 2018-07-26 | Discharge: 2018-07-26 | Disposition: A | Discharge status: Home / Self Care | Source: Ambulatory Visit | Attending: Orthopaedic Surgery | Admitting: Orthopaedic Surgery

## 2018-07-26 DIAGNOSIS — M5136 Other intervertebral disc degeneration, lumbar region: Secondary | ICD-10-CM

## 2018-07-27 ENCOUNTER — Telehealth: Payer: Self-pay

## 2018-07-27 NOTE — Telephone Encounter (Signed)
Telephone encounter opened in error.

## 2018-07-31 ENCOUNTER — Ambulatory Visit (INDEPENDENT_AMBULATORY_CARE_PROVIDER_SITE_OTHER)

## 2018-07-31 DIAGNOSIS — J309 Allergic rhinitis, unspecified: Secondary | ICD-10-CM

## 2018-08-07 ENCOUNTER — Ambulatory Visit (INDEPENDENT_AMBULATORY_CARE_PROVIDER_SITE_OTHER)

## 2018-08-07 DIAGNOSIS — J309 Allergic rhinitis, unspecified: Secondary | ICD-10-CM | POA: Diagnosis not present

## 2018-08-14 ENCOUNTER — Ambulatory Visit (INDEPENDENT_AMBULATORY_CARE_PROVIDER_SITE_OTHER): Admitting: *Deleted

## 2018-08-14 ENCOUNTER — Other Ambulatory Visit: Payer: Self-pay

## 2018-08-14 DIAGNOSIS — J309 Allergic rhinitis, unspecified: Secondary | ICD-10-CM

## 2018-08-14 MED ORDER — EPINEPHRINE 0.3 MG/0.3ML IJ SOAJ: 0.3000 mg | Freq: Once | INTRAMUSCULAR | 2 refills | Status: AC

## 2018-08-21 ENCOUNTER — Ambulatory Visit (INDEPENDENT_AMBULATORY_CARE_PROVIDER_SITE_OTHER): Admitting: *Deleted

## 2018-08-21 DIAGNOSIS — J309 Allergic rhinitis, unspecified: Secondary | ICD-10-CM | POA: Diagnosis not present

## 2018-08-28 ENCOUNTER — Other Ambulatory Visit: Payer: Self-pay | Admitting: Family Medicine

## 2018-08-28 DIAGNOSIS — Z1231 Encounter for screening mammogram for malignant neoplasm of breast: Secondary | ICD-10-CM

## 2018-09-04 ENCOUNTER — Ambulatory Visit (INDEPENDENT_AMBULATORY_CARE_PROVIDER_SITE_OTHER)

## 2018-09-04 DIAGNOSIS — J309 Allergic rhinitis, unspecified: Secondary | ICD-10-CM | POA: Diagnosis not present

## 2018-09-18 ENCOUNTER — Ambulatory Visit (INDEPENDENT_AMBULATORY_CARE_PROVIDER_SITE_OTHER)

## 2018-09-18 DIAGNOSIS — J309 Allergic rhinitis, unspecified: Secondary | ICD-10-CM

## 2018-10-02 ENCOUNTER — Ambulatory Visit (INDEPENDENT_AMBULATORY_CARE_PROVIDER_SITE_OTHER)

## 2018-10-02 DIAGNOSIS — J309 Allergic rhinitis, unspecified: Secondary | ICD-10-CM | POA: Diagnosis not present

## 2018-10-09 ENCOUNTER — Encounter: Payer: Self-pay | Admitting: Gastroenterology

## 2018-10-09 ENCOUNTER — Ambulatory Visit (INDEPENDENT_AMBULATORY_CARE_PROVIDER_SITE_OTHER)

## 2018-10-09 DIAGNOSIS — J309 Allergic rhinitis, unspecified: Secondary | ICD-10-CM

## 2018-10-11 NOTE — Progress Notes (Signed)
VIAL EXP 10-11-2019 

## 2018-10-12 DIAGNOSIS — J3089 Other allergic rhinitis: Secondary | ICD-10-CM | POA: Diagnosis not present

## 2018-10-13 ENCOUNTER — Other Ambulatory Visit: Payer: Self-pay

## 2018-10-13 MED ORDER — ADALIMUMAB 40 MG/0.8ML ~~LOC~~ AJKT: 40.0000 mg | AUTO-INJECTOR | SUBCUTANEOUS | 1 refills | Status: DC

## 2018-10-19 ENCOUNTER — Ambulatory Visit: Admitting: *Deleted

## 2018-10-19 ENCOUNTER — Other Ambulatory Visit: Payer: Self-pay

## 2018-10-19 ENCOUNTER — Ambulatory Visit

## 2018-10-19 ENCOUNTER — Ambulatory Visit
Admission: RE | Admit: 2018-10-19 | Discharge: 2018-10-19 | Disposition: A | Discharge status: Home / Self Care | Source: Ambulatory Visit | Attending: Family Medicine | Admitting: Family Medicine

## 2018-10-19 VITALS — Ht 63.0 in | Wt 173.0 lb

## 2018-10-19 DIAGNOSIS — K5010 Crohn's disease of large intestine without complications: Secondary | ICD-10-CM

## 2018-10-19 DIAGNOSIS — Z1231 Encounter for screening mammogram for malignant neoplasm of breast: Secondary | ICD-10-CM

## 2018-10-19 MED ORDER — NA SULFATE-K SULFATE-MG SULF 17.5-3.13-1.6 GM/177ML PO SOLN: ORAL | 0 refills | Status: DC

## 2018-10-19 NOTE — Progress Notes (Signed)
Patient's pre-visit was done today over the phone with the patient. Name,DOB and address verified. Insurance verified. Packet of Prep instructions mailed to patient including copy of a consent form and pre-procedure patient acknowledgement form-pt is aware. Patient understands to call us back with any questions or concerns.   Patient denies any allergies to eggs or soy. Patient denies any problems with anesthesia/sedation. Patient denies any oxygen use at home. Patient denies taking any diet/weight loss medications or blood thinners. EMMI education declined by pt.

## 2018-10-29 ENCOUNTER — Other Ambulatory Visit: Payer: Self-pay | Admitting: Allergy & Immunology

## 2018-10-30 ENCOUNTER — Ambulatory Visit (INDEPENDENT_AMBULATORY_CARE_PROVIDER_SITE_OTHER)

## 2018-10-30 DIAGNOSIS — J309 Allergic rhinitis, unspecified: Secondary | ICD-10-CM

## 2018-11-06 ENCOUNTER — Telehealth: Payer: Self-pay | Admitting: *Deleted

## 2018-11-06 NOTE — Telephone Encounter (Signed)
Covid-19 screening questions  Have you traveled in the last 14 days? no If yes where?  Do you now or have you had a fever in the last 14 days? no  Do you have any respiratory symptoms of shortness of breath or cough now or in the last 14 days? no  Do you have any family members or close contacts with diagnosed or suspected Covid-19 in the past 14 days? no  Have you been tested for Covid-19 and found to be positive? noPt is aware that care partner will wait in the car during parking lot; if they feel like they will be too hot to wait in the car; they may wait in the lobby.  We want them to wear a mask (we do not have any that we can provide them), practice social distancing, and we will check their temperatures when they get here.  I did remind patient that their care partner needs to stay in the parking lot the entire time. Pt will wear mask into building

## 2018-11-07 ENCOUNTER — Other Ambulatory Visit: Payer: Self-pay

## 2018-11-07 ENCOUNTER — Ambulatory Visit (AMBULATORY_SURGERY_CENTER): Admitting: Gastroenterology

## 2018-11-07 ENCOUNTER — Encounter: Payer: Self-pay | Admitting: Gastroenterology

## 2018-11-07 ENCOUNTER — Ambulatory Visit: Admitting: Allergy & Immunology

## 2018-11-07 VITALS — BP 133/77 | HR 75 | Temp 98.8°F | Resp 14 | Ht 63.0 in | Wt 173.0 lb

## 2018-11-07 DIAGNOSIS — K5010 Crohn's disease of large intestine without complications: Secondary | ICD-10-CM | POA: Diagnosis not present

## 2018-11-07 DIAGNOSIS — Z86010 Personal history of colonic polyps: Secondary | ICD-10-CM

## 2018-11-07 NOTE — Op Note (Signed)
[facility] Patient Name: Brianna Santana Procedure Date: 11/07/2018 12:51 PM MRN: [id number] Endoscopist: Ladene Artist , MD Age: 63 Referring MD:  Date of Birth: [birth year] Gender: Female Account #: [id number] Procedure:                Colonoscopy Indications:              High risk colon cancer surveillance: Crohn's                            colitis of 8 (or more) years duration with                            one-third (or more) of the colon involved Medicines:                Monitored Anesthesia Care Procedure:                Pre-Anesthesia Assessment:                           - Prior to the procedure, a History and Physical                            was performed, and patient medications and                            allergies were reviewed. The patient's tolerance of                            previous anesthesia was also reviewed. The risks                            and benefits of the procedure and the sedation                            options and risks were discussed with the patient.                            All questions were answered, and informed consent                            was obtained. Prior Anticoagulants: The patient has                            taken no previous anticoagulant or antiplatelet                            agents. ASA Grade Assessment: II - A patient with                            mild systemic disease. After reviewing the risks                            and benefits, the patient was deemed in  satisfactory condition to undergo the procedure.                           After obtaining informed consent, the colonoscope                            was passed under direct vision. Throughout the                            procedure, the patient's blood pressure, pulse, and                            oxygen saturations were monitored continuously. The                            Colonoscope was introduced  through the anus and                            advanced to the the terminal ileum, with                            identification of the appendiceal orifice and IC                            valve. The terminal ileum, ileocecal valve,                            appendiceal orifice, and rectum were photographed.                            The quality of the bowel preparation was excellent.                            The colonoscopy was performed without difficulty.                            The patient tolerated the procedure well. Scope In: 12:58:29 PM Scope Out: 1:11:41 PM Scope Withdrawal Time: 0 hours 10 minutes 5 seconds  Total Procedure Duration: 0 hours 13 minutes 12 seconds  Findings:                 The perianal and digital rectal examinations were                            normal.                           The terminal ileum appeared normal.                           Localized mild inflammation characterized by                            erosions was found in the cecum. Biopsies were  taken with a cold forceps for histology.                           Inflammation characterized by loss of vascularity,                            pseudopolyps and scarring was found in a continuous                            and circumferential pattern from the sigmoid colon                            to the descending colon. This was moderate in                            severity, and when compared to previous                            examinations, the findings are unchanged. Biopsies                            were taken with a cold forceps for histology.                           Multiple medium-mouthed diverticula were found in                            the left colon. There was no evidence of                            diverticular bleeding.                           Internal hemorrhoids were found during                            retroflexion. The hemorrhoids  were medium-sized and                            Grade I (internal hemorrhoids that do not prolapse).                           The exam was otherwise without abnormality on                            direct and retroflexion views. Complications:            No immediate complications. Estimated blood loss:                            None. Estimated Blood Loss:     Estimated blood loss: none. Impression:               - The examined portion of the ileum was normal.                           -  Localized mild inflammation was found in the                            cecum secondary to colitis. Biopsied.                           - Quiescent Crohn's disease. Inflammation was found                            from the sigmoid colon to the descending colon.                            This was moderate in severity, unchanged compared                            to previous examinations. Biopsied.                           - Mild diverticulosis in the left colon. There was                            no evidence of diverticular bleeding.                           - Internal hemorrhoids.                           - The examination was otherwise normal on direct                            and retroflexion views. Recommendation:           - Repeat colonoscopy in 5 years for surveillance.                           - Patient has a contact number available for                            emergencies. The signs and symptoms of potential                            delayed complications were discussed with the                            patient. Return to normal activities tomorrow.                            Written discharge instructions were provided to the                            patient.                           - Resume previous diet.                           -  Continue present medications.                           - Await pathology results. Ladene Artist, MD 11/07/2018 1:18:38 PM This report  has been signed electronically.

## 2018-11-07 NOTE — Patient Instructions (Signed)
Read all of the handouts given to you by your recovery room nurse.  YOU HAD AN ENDOSCOPIC PROCEDURE TODAY AT [facility]:   Refer to the procedure report that was given to you for any specific questions about what was found during the examination.  If the procedure report does not answer your questions, please call your gastroenterologist to clarify.  If you requested that your care partner not be given the details of your procedure findings, then the procedure report has been included in a sealed envelope for you to review at your convenience later.  YOU SHOULD EXPECT: Some feelings of bloating in the abdomen. Passage of more gas than usual.  Walking can help get rid of the air that was put into your GI tract during the procedure and reduce the bloating. If you had a lower endoscopy (such as a colonoscopy or flexible sigmoidoscopy) you may notice spotting of blood in your stool or on the toilet paper. If you underwent a bowel prep for your procedure, you may not have a normal bowel movement for a few days.  Please Note:  You might notice some irritation and congestion in your nose or some drainage.  This is from the oxygen used during your procedure.  There is no need for concern and it should clear up in a day or so.  SYMPTOMS TO REPORT IMMEDIATELY:   Following lower endoscopy (colonoscopy or flexible sigmoidoscopy):  Excessive amounts of blood in the stool  Significant tenderness or worsening of abdominal pains  Swelling of the abdomen that is new, acute  Fever of 100F or higher   For urgent or emergent issues, a gastroenterologist can be reached at any hour by calling [phone].   DIET:  We do recommend a small meal at first, but then you may proceed to your regular diet.  Drink plenty of fluids but you should avoid alcoholic beverages for 24 hours.  ACTIVITY:  You should plan to take it easy for the rest of today and you should NOT DRIVE or use heavy machinery until  tomorrow (because of the sedation medicines used during the test).    FOLLOW UP: Our staff will call the number listed on your records 48-72 hours following your procedure to check on you and address any questions or concerns that you may have regarding the information given to you following your procedure. If we do not reach you, we will leave a message.  We will attempt to reach you two times.  During this call, we will ask if you have developed any symptoms of COVID 19. If you develop any symptoms (ie: fever, flu-like symptoms, shortness of breath, cough etc.) before then, please call [phone].  If you test positive for Covid 19 in the 2 weeks post procedure, please call and report this information to [name].    If any biopsies were taken you will be contacted by phone or by letter within the next 1-3 weeks.  Please call us at [phone] if you have not heard about the biopsies in 3 weeks.    SIGNATURES/CONFIDENTIALITY: You and/or your care partner have signed paperwork which will be entered into your electronic medical record.  These signatures attest to the fact that that the information above on your After Visit Summary has been reviewed and is understood.  Full responsibility of the confidentiality of this discharge information lies with you and/or your care-partner.

## 2018-11-07 NOTE — Progress Notes (Signed)
Report to PACU, RN, vss, BBS= Clear.  

## 2018-11-07 NOTE — Progress Notes (Signed)
Called to room to assist during endoscopic procedure.  Patient ID and intended procedure confirmed with present staff. Received instructions for my participation in the procedure from the performing physician.  

## 2018-11-07 NOTE — Progress Notes (Signed)
Pt's states no medical or surgical changes since previsit or office visit. 

## 2018-11-09 ENCOUNTER — Telehealth: Payer: Self-pay | Admitting: *Deleted

## 2018-11-09 NOTE — Telephone Encounter (Signed)
  Follow up Call-  Call back number 11/07/2018  Post procedure Call Back phone  # [phone]  Permission to leave phone message Yes  Some recent data might be hidden     Patient questions:  Do you have a fever, pain , or abdominal swelling? No. Pain Score  0 *  Have you tolerated food without any problems? Yes.    Have you been able to return to your normal activities? Yes.    Do you have any questions about your discharge instructions: Diet   No. Medications  No. Follow up visit  No.  Do you have questions or concerns about your Care? No.  Actions: * If pain score is 4 or above: No action needed, pain <4.  1. Have you developed a fever since your procedure? no  2.   Have you had an respiratory symptoms (SOB or cough) since your procedure? no  3.   Have you tested positive for COVID 19 since your procedure no  4.   Have you had any family members/close contacts diagnosed with the COVID 19 since your procedure?  no   If yes to any of these questions please route to Joylene John, RN and Alphonsa Gin, Therapist, sports.

## 2018-11-10 ENCOUNTER — Other Ambulatory Visit: Payer: Self-pay

## 2018-11-13 ENCOUNTER — Encounter: Payer: Self-pay | Admitting: Gastroenterology

## 2018-11-13 ENCOUNTER — Ambulatory Visit (INDEPENDENT_AMBULATORY_CARE_PROVIDER_SITE_OTHER): Admitting: Women's Health

## 2018-11-13 ENCOUNTER — Encounter: Payer: Self-pay | Admitting: Women's Health

## 2018-11-13 ENCOUNTER — Other Ambulatory Visit: Payer: Self-pay

## 2018-11-13 ENCOUNTER — Ambulatory Visit (INDEPENDENT_AMBULATORY_CARE_PROVIDER_SITE_OTHER): Admitting: *Deleted

## 2018-11-13 VITALS — BP 126/80 | Ht 63.0 in | Wt 176.0 lb

## 2018-11-13 DIAGNOSIS — J309 Allergic rhinitis, unspecified: Secondary | ICD-10-CM | POA: Diagnosis not present

## 2018-11-13 DIAGNOSIS — Z01419 Encounter for gynecological examination (general) (routine) without abnormal findings: Secondary | ICD-10-CM

## 2018-11-13 DIAGNOSIS — Z13820 Encounter for screening for osteoporosis: Secondary | ICD-10-CM

## 2018-11-13 NOTE — Progress Notes (Signed)
Brianna Santana Mar 08, 1956 [id number]    History:    Presents for annual exam.  2002 TVH for fibroids.  Normal Pap and mammogram history.  History of Crohn's, GERD and C. difficile.  10/2018 no colon polyps noted.  2018 T score -2.4 at spine, -1.6 at femoral neck, FRAX 3% / 0.2%.  Vaccines current.  Primary care manages hypothyroidism, asthma.  Past medical history, past surgical history, family history and social history were all reviewed and documented in the EPIC chart.  Does ministry work in the prison system.  Had been doing water aerobics until COVID.  Husband prostate cancer rare intercourse.  3 children all doing well, daughter Oceanographer moving to [place].  Son lives local.  ROS:  A ROS was performed and pertinent positives and negatives are included.  Exam:  Vitals:   11/13/18 0855  BP: 126/80  Weight: 176 lb (79.8 kg)  Height: 5' 3"  (1.6 m)   Body mass index is 31.18 kg/m.   General appearance:  Normal Thyroid:  Symmetrical, normal in size, without palpable masses or nodularity. Respiratory  Auscultation:  Clear without wheezing or rhonchi Cardiovascular  Auscultation:  Regular rate, without rubs, murmurs or gallops  Edema/varicosities:  Not grossly evident Abdominal  Soft,nontender, without masses, guarding or rebound.  Liver/spleen:  No organomegaly noted  Hernia:  None appreciated  Skin  Inspection:  Grossly normal   Breasts: Examined lying and sitting.     Right: Without masses, retractions, discharge or axillary adenopathy.     Left: Without masses, retractions, discharge or axillary adenopathy. Gentitourinary   Inguinal/mons:  Normal without inguinal adenopathy  External genitalia:  Normal  BUS/Urethra/Skene's glands:  Normal  Vagina:  Normal  Cervix: And uterus absent adnexa/parametria:     Rt: Without masses or tenderness.   Lt: Without masses or tenderness.  Anus and perineum: Normal  Digital rectal exam: Normal sphincter tone without  palpated masses or tenderness  Assessment/Plan:  63 y.o. MBF G3, P3 for annual exam with no complaints.  2002 TVH for fibroids Osteopenia without elevated FRAX Crohn's-GI manages Obesity Asthma, arthritis-primary care manages meds and labs  Plan: Repeat DEXA, home safety, fall prevention and importance of weightbearing and balance type exercise reviewed.  SBEs, continue annual screening mammogram, calcium rich foods, vitamin D 2000 daily encouraged.    [place], 9:11 AM 11/13/2018

## 2018-11-13 NOTE — Patient Instructions (Signed)
Health Maintenance for Postmenopausal Women Menopause is a normal process in which your reproductive ability comes to an end. This process happens gradually over a span of months to years, usually between the ages of 62 and 89. Menopause is complete when you have missed 12 consecutive menstrual periods. It is important to talk with your health care provider about some of the most common conditions that affect postmenopausal women, such as heart disease, cancer, and bone loss (osteoporosis). Adopting a healthy lifestyle and getting preventive care can help to promote your health and wellness. Those actions can also lower your chances of developing some of these common conditions. What should I know about menopause? During menopause, you may experience a number of symptoms, such as:  Moderate-to-severe hot flashes.  Night sweats.  Decrease in sex drive.  Mood swings.  Headaches.  Tiredness.  Irritability.  Memory problems.  Insomnia. Choosing to treat or not to treat menopausal changes is an individual decision that you make with your health care provider. What should I know about hormone replacement therapy and supplements? Hormone therapy products are effective for treating symptoms that are associated with menopause, such as hot flashes and night sweats. Hormone replacement carries certain risks, especially as you become older. If you are thinking about using estrogen or estrogen with progestin treatments, discuss the benefits and risks with your health care provider. What should I know about heart disease and stroke? Heart disease, heart attack, and stroke become more likely as you age. This may be due, in part, to the hormonal changes that your body experiences during menopause. These can affect how your body processes dietary fats, triglycerides, and cholesterol. Heart attack and stroke are both medical emergencies. There are many things that you can do to help prevent heart disease  and stroke:  Have your blood pressure checked at least every 1-2 years. High blood pressure causes heart disease and increases the risk of stroke.  If you are 79-72 years old, ask your health care provider if you should take aspirin to prevent a heart attack or a stroke.  Do not use any tobacco products, including cigarettes, chewing tobacco, or electronic cigarettes. If you need help quitting, ask your health care provider.  It is important to eat a healthy diet and maintain a healthy weight. ? Be sure to include plenty of vegetables, fruits, low-fat dairy products, and lean protein. ? Avoid eating foods that are high in solid fats, added sugars, or salt (sodium).  Get regular exercise. This is one of the most important things that you can do for your health. ? Try to exercise for at least 150 minutes each week. The type of exercise that you do should increase your heart rate and make you sweat. This is known as moderate-intensity exercise. ? Try to do strengthening exercises at least twice each week. Do these in addition to the moderate-intensity exercise.  Know your numbers.Ask your health care provider to check your cholesterol and your blood glucose. Continue to have your blood tested as directed by your health care provider.  What should I know about cancer screening? There are several types of cancer. Take the following steps to reduce your risk and to catch any cancer development as early as possible. Breast Cancer  Practice breast self-awareness. ? This means understanding how your breasts normally appear and feel. ? It also means doing regular breast self-exams. Let your health care provider know about any changes, no matter how small.  If you are 40 or  older, have a clinician do a breast exam (clinical breast exam or CBE) every year. Depending on your age, family history, and medical history, it may be recommended that you also have a yearly breast X-ray (mammogram).  If you  have a family history of breast cancer, talk with your health care provider about genetic screening.  If you are at high risk for breast cancer, talk with your health care provider about having an MRI and a mammogram every year.  Breast cancer (BRCA) gene test is recommended for women who have family members with BRCA-related cancers. Results of the assessment will determine the need for genetic counseling and BRCA1 and for BRCA2 testing. BRCA-related cancers include these types: ? Breast. This occurs in males or females. ? Ovarian. ? Tubal. This may also be called fallopian tube cancer. ? Cancer of the abdominal or pelvic lining (peritoneal cancer). ? Prostate. ? Pancreatic. Cervical, Uterine, and Ovarian Cancer Your health care provider may recommend that you be screened regularly for cancer of the pelvic organs. These include your ovaries, uterus, and vagina. This screening involves a pelvic exam, which includes checking for microscopic changes to the surface of your cervix (Pap test).  For women ages 21-65, health care providers may recommend a pelvic exam and a Pap test every three years. For women ages 39-65, they may recommend the Pap test and pelvic exam, combined with testing for human papilloma virus (HPV), every five years. Some types of HPV increase your risk of cervical cancer. Testing for HPV may also be done on women of any age who have unclear Pap test results.  Other health care providers may not recommend any screening for nonpregnant women who are considered low risk for pelvic cancer and have no symptoms. Ask your health care provider if a screening pelvic exam is right for you.  If you have had past treatment for cervical cancer or a condition that could lead to cancer, you need Pap tests and screening for cancer for at least 20 years after your treatment. If Pap tests have been discontinued for you, your risk factors (such as having a new sexual partner) need to be reassessed  to determine if you should start having screenings again. Some women have medical problems that increase the chance of getting cervical cancer. In these cases, your health care provider may recommend that you have screening and Pap tests more often.  If you have a family history of uterine cancer or ovarian cancer, talk with your health care provider about genetic screening.  If you have vaginal bleeding after reaching menopause, tell your health care provider.  There are currently no reliable tests available to screen for ovarian cancer. Lung Cancer Lung cancer screening is recommended for adults 57-50 years old who are at high risk for lung cancer because of a history of smoking. A yearly low-dose CT scan of the lungs is recommended if you:  Currently smoke.  Have a history of at least 30 pack-years of smoking and you currently smoke or have quit within the past 15 years. A pack-year is smoking an average of one pack of cigarettes per day for one year. Yearly screening should:  Continue until it has been 15 years since you quit.  Stop if you develop a health problem that would prevent you from having lung cancer treatment. Colorectal Cancer  This type of cancer can be detected and can often be prevented.  Routine colorectal cancer screening usually begins at age 12 and continues through  age 63.  If you have risk factors for colon cancer, your health care provider may recommend that you be screened at an earlier age.  If you have a family history of colorectal cancer, talk with your health care provider about genetic screening.  Your health care provider may also recommend using home test kits to check for hidden blood in your stool.  A small camera at the end of a tube can be used to examine your colon directly (sigmoidoscopy or colonoscopy). This is done to check for the earliest forms of colorectal cancer.  Direct examination of the colon should be repeated every 5-10 years until  age 75. However, if early forms of precancerous polyps or small growths are found or if you have a family history or genetic risk for colorectal cancer, you may need to be screened more often. Skin Cancer  Check your skin from head to toe regularly.  Monitor any moles. Be sure to tell your health care provider: ? About any new moles or changes in moles, especially if there is a change in a mole's shape or color. ? If you have a mole that is larger than the size of a pencil eraser.  If any of your family members has a history of skin cancer, especially at a [place] age, talk with your health care provider about genetic screening.  Always use sunscreen. Apply sunscreen liberally and repeatedly throughout the day.  Whenever you are outside, protect yourself by wearing long sleeves, pants, a wide-brimmed hat, and sunglasses. What should I know about osteoporosis? Osteoporosis is a condition in which bone destruction happens more quickly than new bone creation. After menopause, you may be at an increased risk for osteoporosis. To help prevent osteoporosis or the bone fractures that can happen because of osteoporosis, the following is recommended:  If you are 59-59 years old, get at least 1,000 mg of calcium and at least 600 mg of vitamin D per day.  If you are older than age 36 but younger than age 32, get at least 1,200 mg of calcium and at least 600 mg of vitamin D per day.  If you are older than age 47, get at least 1,200 mg of calcium and at least 800 mg of vitamin D per day. Smoking and excessive alcohol intake increase the risk of osteoporosis. Eat foods that are rich in calcium and vitamin D, and do weight-bearing exercises several times each week as directed by your health care provider. What should I know about how menopause affects my mental health? Depression may occur at any age, but it is more common as you become older. Common symptoms of depression include:  Low or sad mood.   Changes in sleep patterns.  Changes in appetite or eating patterns.  Feeling an overall lack of motivation or enjoyment of activities that you previously enjoyed.  Frequent crying spells. Talk with your health care provider if you think that you are experiencing depression. What should I know about immunizations? It is important that you get and maintain your immunizations. These include:  Tetanus, diphtheria, and pertussis (Tdap) booster vaccine.  Influenza every year before the flu season begins.  Pneumonia vaccine.  Shingles vaccine. Your health care provider may also recommend other immunizations. This information is not intended to replace advice given to you by your health care provider. Make sure you discuss any questions you have with your health care provider. Document Released: 07/09/2005 Document Revised: 12/05/2015 Document Reviewed: 02/18/2015 Elsevier Interactive Patient Education  2019 [place].

## 2018-11-15 LAB — URINALYSIS, COMPLETE W/RFL CULTURE
Bacteria, UA: NONE SEEN /HPF
Bilirubin Urine: NEGATIVE
Glucose, UA: NEGATIVE
Hgb urine dipstick: NEGATIVE
Hyaline Cast: NONE SEEN /LPF
Ketones, ur: NEGATIVE
Nitrites, Initial: NEGATIVE
Protein, ur: NEGATIVE
RBC / HPF: NONE SEEN /HPF (ref 0–2)
Specific Gravity, Urine: 1.009 (ref 1.001–1.03)
Squamous Epithelial / HPF: NONE SEEN /HPF (ref <=5)
WBC, UA: NONE SEEN /HPF (ref 0–5)
pH: 5.5 (ref 5.0–8.0)

## 2018-11-15 LAB — URINE CULTURE
MICRO NUMBER:: 574142
SPECIMEN QUALITY:: ADEQUATE

## 2018-11-15 LAB — CULTURE INDICATED

## 2018-11-21 ENCOUNTER — Telehealth: Payer: Self-pay | Admitting: Gastroenterology

## 2018-11-21 MED ORDER — POTASSIUM CHLORIDE ER 10 MEQ PO TBCR: 10.0000 meq | EXTENDED_RELEASE_TABLET | Freq: Every day | ORAL | 1 refills | Status: DC

## 2018-11-21 NOTE — Telephone Encounter (Signed)
Prescription sent to patient's pharmacy and patient notified.

## 2018-11-23 ENCOUNTER — Encounter: Payer: Self-pay | Admitting: Allergy & Immunology

## 2018-11-23 ENCOUNTER — Ambulatory Visit (INDEPENDENT_AMBULATORY_CARE_PROVIDER_SITE_OTHER): Admitting: Allergy & Immunology

## 2018-11-23 ENCOUNTER — Other Ambulatory Visit: Payer: Self-pay

## 2018-11-23 DIAGNOSIS — J3089 Other allergic rhinitis: Secondary | ICD-10-CM | POA: Diagnosis not present

## 2018-11-23 DIAGNOSIS — J4540 Moderate persistent asthma, uncomplicated: Secondary | ICD-10-CM

## 2018-11-23 MED ORDER — MONTELUKAST SODIUM 10 MG PO TABS: 10.0000 mg | ORAL_TABLET | Freq: Every day | ORAL | 1 refills | Status: DC

## 2018-11-23 NOTE — Patient Instructions (Addendum)
1. Moderate persistent asthma, uncomplicated - We are not going to make any medication changes at this time.  - Daily controller medication(s): Singulair 43m daily + Symbicort 80/4.5 two puffs twice daily with spacer  - Rescue medications: ProAir 4 puffs every 4-6 hours as needed - Asthma control goals:  * Full participation in all de r less a week on average (not counting use with activity) * Cough interfering with sleep two time or less a month * Oral steroids no more than once a year * No hospitalizations  2. Chronic perennial rhinitis (cockroach, dust mite, molds) - Continue with allergy shots at the same schedule.  - Continue with all of your nose sprays on an as-needed basis. - Anticipate five years of allergen immunotherapy (September 2024).   3. Return in about 6 months (around 05/25/2019). This can be an in-person, a virtual Webex or a telephone follow up visit.   Please inform [place] any Emergency Department visits, hospitalizations, or changes in symptoms. Call [place] going to the ED for breathing or allergy symptoms since we might be able to fit you in for a sick visit. Feel free to contact [place] with any questions, problems, or concerns.  It was a pleasure to talk to you today today!  Websites that have reliable patient information: 1. American Academy of Asthma, Allergy, and Immunology: www.aaaai.org 2. Food Allergy Research and Education (FARE): foodallergy.org 3. Mothers of Asthmatics: http://www.asthmacommunitynetwork.org 4. American College of Allergy, Asthma, and Immunology: www.acaai.org  "Like" [facility] Facebook and Instagram for our latest updates!      Make sure you are registered to vote! If you have moved or changed any of your contact information, you will need to get this updated before voting!  In some cases, you MAY be able to register to vote online: [email or web address]   Voter ID laws are NOT going into effect  for the General Election in November 2020! DO NOT let this stop you from exercising your right to vote!   Absentee voting is the SAFEST way to vote during the coronavirus pandemic!   Download and print an absentee ballot request form at [email or web address] or you can scan the QR code below with your smart phone:      More information on absentee ballots can be found here: [email or web address]

## 2018-11-23 NOTE — Progress Notes (Signed)
RE: Brianna Santana MRN: [id number] DOB: 07/24/1955 Date of Telemedicine Visit: 11/23/2018  Referring provider: [name], FNP Primary care provider: Cari Caraway, MD  Chief Complaint: Asthma   Telemedicine Follow Up Visit via Telephone: I connected with [name] for a follow up on 11/23/18 by telephone and verified that I am speaking with the correct person using two identifiers.   I discussed the limitations, risks, security and privacy concerns of performing an evaluation and management service by telephone and the availability of in person appointments. I also discussed with the patient that there may be a patient responsible charge related to this service. The patient expressed understanding and agreed to proceed.  Patient is at home accompanied by her husband who provided/contributed to the history.  Provider is at the office.  Visit start time: 9:44 AM Visit end time: 10:04 AM Insurance consent/check in by: [employer] consent and medical assistant/nurse: Garlon Hatchet  History of Present Illness:  She is a 63 y.o. female, who is being followed for persistent asthma and allergic rhinitis. Her previous allergy office visit was in December 2019 with Dr. Ernst Bowler.  At her last visit, her lung function looked great.  We did not make any medication changes.  We continue the Symbicort 80/4.5 mcg 2 puffs twice daily with a spacer as well as pro-air as needed.  We also continued Singulair 10 mg daily.  For her allergic rhinitis, we continued allergy shots at the same schedule as well as nasal saline spray.  We also continued Astelin and Flonase.  Since the last visit, she reports that she has done very well. She also reports that they have been taking excellent precautions regarding the [place] pandemic.   Asthma/Respiratory Symptom History: She remains on her Symbicort two puffs twice daily. She did use her albuterol a few nights ago after she ate some tortilla chips  with hummus.   Allergic Rhinitis Symptom History: She remains on her allergy shots. She reports that she is very happy that she started them. Her runny nose has improved a lot. She does not even need a nose spray now, whereas a year ago she was in the clinic 4-5 times per year with problems.   Brianna Santana is on allergen immunotherapy. She receives one injection. Immunotherapy script #1 contains molds, dust mites and cockroach. She currently receives 0.14m of the RED vial (1/100). She started shots July of 2019 and reached maintenance in September of 2019.  She ate some tortilla chips earlier this week. She avoids corn anyway, but she thought that the tortilla chips bothered her. She avoids corn due to Crohn's disease. This typically gives her stomach pain when bshe eats it. She is going to continue to avoid corn now and is not interested in testing at all. She does have an EpiPen on hand if she should need it (prescribed due to allergen immunotherapy).  Otherwise, there have been no changes to her past medical history, surgical history, family history, or social history.  Assessment and Plan:  DJaelyneis a 63y.o. female with:  Moderate persistent asthma without complication   Perennial allergic rhinitis(cockroach, molds, dust mites) - on allergen immunotherapy with maintenance reached in September 2019   1. Moderate persistent asthma, uncomplicated - We are not going to make any medication changes at this time.  - Daily controller medication(s): Singulair 163mdaily + Symbicort 80/4.5 two puffs twice daily with spacer  - Rescue medications: ProAir 4 puffs every 4-6 hours as needed -  Asthma control goals:  * Full participation in all de r less a week on average (not counting use with activity) * Cough interfering with sleep two time or less a month * Oral steroids no more than once a year * No hospitalizations  2. Chronic perennial rhinitis (cockroach, dust mite, molds) - Continue with  allergy shots at the same schedule.  - Continue with all of your nose sprays on an as-needed basis. - Anticipate five years of allergen immunotherapy (September 2024).   3. Return in about 6 months (around 05/25/2019). This can be an in-person, a virtual Webex or a telephone follow up visit.     Diagnostics: None.  Medication List:  Current Outpatient Medications  Medication Sig Dispense Refill  . Adalimumab (HUMIRA PEN) 40 MG/0.8ML PNKT Inject 40 mg into the skin every 14 (fourteen) days. Citrate free 6 each 1  . azelastine (ASTELIN) 0.1 % nasal spray Spray 1-2 sprays per nostril twice daily 30 mL 5  . Cholecalciferol (VITAMIN D3) 2000 units TABS Take 2 tablets by mouth daily.    Marland Kitchen gabapentin (NEURONTIN) 300 MG capsule Take 300 mg by mouth 2 (two) times daily.     . magnesium 30 MG tablet Take 1 tablet (30 mg total) by mouth daily. 90 tablet 3  . meloxicam (MOBIC) 15 MG tablet Take 15 mg by mouth daily.    . montelukast (SINGULAIR) 10 MG tablet Take 1 tablet (10 mg total) by mouth at bedtime. 90 tablet 1  . Multiple Vitamin (MULTIVITAMIN WITH MINERALS) TABS Take 1 tablet by mouth daily.    Marland Kitchen omeprazole (PRILOSEC) 40 MG capsule TAKE 1 CAPSULE BY MOUTH DAILY FOR HEARTBURN 90 capsule 3  . potassium chloride (K-DUR) 10 MEQ tablet Take 1 tablet (10 mEq total) by mouth daily. 90 tablet 1  . PROAIR HFA 108 (90 Base) MCG/ACT inhaler INHALE 4 PUFFS INTO THE LUNGS EVERY 4 HOURS AS NEEDED FOR SHORTNESS OF BREATH OR WHEEZING  18 g 1  . SYMBICORT 80-4.5 MCG/ACT inhaler INHALE 2 PUFFS INTO THE LUNGS TWICE A DAY  10.2 g 4  . tiZANidine (ZANAFLEX) 4 MG capsule Take 4 mg by mouth 3 (three) times daily.    . cetirizine (ZYRTEC) 10 MG tablet Take 1 tablet (10 mg total) by mouth daily. 90 tablet 2   No current facility-administered medications for this visit.    Allergies: Allergies  Allergen Reactions  . Amoxicillin Itching  . Levofloxacin Other (See Comments)    Doesn't remember reaction  .  Seasonal Ic [Cholestatin]   . Tramadol     Major Headaches   I reviewed her past medical history, social history, family history, and environmental history and no significant changes have been reported from previous visits.  Review of Systems  Constitutional: Negative for activity change and appetite change.  HENT: Negative for congestion, postnasal drip, rhinorrhea, sinus pressure, sinus pain, sneezing and sore throat.   Eyes: Negative for pain, discharge, redness and itching.  Respiratory: Negative for shortness of breath, wheezing and stridor.   Gastrointestinal: Negative for diarrhea, nausea and vomiting.  Endocrine: Negative for cold intolerance and heat intolerance.  Musculoskeletal: Negative for arthralgias, joint swelling and myalgias.  Skin: Negative for rash.  Allergic/Immunologic: Negative for environmental allergies, food allergies and immunocompromised state.    Objective:  Physical exam not obtained as encounter was done via telephone.   Previous notes and tests were reviewed.  I discussed the assessment and treatment plan with the patient. The patient was provided  an opportunity to ask questions and all were answered. The patient agreed with the plan and demonstrated an understanding of the instructions.   The patient was advised to call back or seek an in-person evaluation if the symptoms worsen or if the condition fails to improve as anticipated.  I provided 20 minutes of non-face-to-face time during this encounter.  It was my pleasure to participate in Madisen Ludvigsen care today. Please feel free to contact me with any questions or concerns.   Sincerely,  Valentina Shaggy, MD

## 2018-11-23 NOTE — Progress Notes (Signed)
Start 940 Location home End 1000

## 2018-11-27 ENCOUNTER — Ambulatory Visit (INDEPENDENT_AMBULATORY_CARE_PROVIDER_SITE_OTHER): Admitting: *Deleted

## 2018-11-27 DIAGNOSIS — J309 Allergic rhinitis, unspecified: Secondary | ICD-10-CM | POA: Diagnosis not present

## 2018-12-04 ENCOUNTER — Ambulatory Visit (INDEPENDENT_AMBULATORY_CARE_PROVIDER_SITE_OTHER)

## 2018-12-04 DIAGNOSIS — J309 Allergic rhinitis, unspecified: Secondary | ICD-10-CM

## 2018-12-11 ENCOUNTER — Ambulatory Visit (INDEPENDENT_AMBULATORY_CARE_PROVIDER_SITE_OTHER)

## 2018-12-11 DIAGNOSIS — J309 Allergic rhinitis, unspecified: Secondary | ICD-10-CM

## 2018-12-14 ENCOUNTER — Ambulatory Visit (INDEPENDENT_AMBULATORY_CARE_PROVIDER_SITE_OTHER)

## 2018-12-14 ENCOUNTER — Other Ambulatory Visit: Payer: Self-pay

## 2018-12-14 DIAGNOSIS — Z780 Asymptomatic menopausal state: Secondary | ICD-10-CM | POA: Diagnosis not present

## 2018-12-14 DIAGNOSIS — Z13820 Encounter for screening for osteoporosis: Secondary | ICD-10-CM

## 2018-12-14 DIAGNOSIS — M8589 Other specified disorders of bone density and structure, multiple sites: Secondary | ICD-10-CM | POA: Diagnosis not present

## 2018-12-15 ENCOUNTER — Encounter: Payer: Self-pay | Admitting: Gynecology

## 2018-12-15 ENCOUNTER — Other Ambulatory Visit: Payer: Self-pay | Admitting: Gynecology

## 2018-12-15 ENCOUNTER — Other Ambulatory Visit: Payer: Self-pay | Admitting: Women's Health

## 2018-12-15 DIAGNOSIS — M8589 Other specified disorders of bone density and structure, multiple sites: Secondary | ICD-10-CM

## 2018-12-15 DIAGNOSIS — Z780 Asymptomatic menopausal state: Secondary | ICD-10-CM

## 2018-12-18 ENCOUNTER — Ambulatory Visit (INDEPENDENT_AMBULATORY_CARE_PROVIDER_SITE_OTHER): Admitting: *Deleted

## 2018-12-18 DIAGNOSIS — J309 Allergic rhinitis, unspecified: Secondary | ICD-10-CM

## 2018-12-25 ENCOUNTER — Ambulatory Visit (INDEPENDENT_AMBULATORY_CARE_PROVIDER_SITE_OTHER)

## 2018-12-25 DIAGNOSIS — J309 Allergic rhinitis, unspecified: Secondary | ICD-10-CM

## 2019-01-08 ENCOUNTER — Ambulatory Visit (INDEPENDENT_AMBULATORY_CARE_PROVIDER_SITE_OTHER): Admitting: *Deleted

## 2019-01-08 DIAGNOSIS — J309 Allergic rhinitis, unspecified: Secondary | ICD-10-CM | POA: Diagnosis not present

## 2019-01-22 ENCOUNTER — Ambulatory Visit (INDEPENDENT_AMBULATORY_CARE_PROVIDER_SITE_OTHER): Admitting: *Deleted

## 2019-01-22 DIAGNOSIS — J309 Allergic rhinitis, unspecified: Secondary | ICD-10-CM | POA: Diagnosis not present

## 2019-02-06 ENCOUNTER — Ambulatory Visit (INDEPENDENT_AMBULATORY_CARE_PROVIDER_SITE_OTHER): Admitting: *Deleted

## 2019-02-06 DIAGNOSIS — J309 Allergic rhinitis, unspecified: Secondary | ICD-10-CM

## 2019-02-13 DIAGNOSIS — J3089 Other allergic rhinitis: Secondary | ICD-10-CM | POA: Diagnosis not present

## 2019-02-13 NOTE — Progress Notes (Signed)
Vial exp 02-13-20

## 2019-02-19 ENCOUNTER — Ambulatory Visit (INDEPENDENT_AMBULATORY_CARE_PROVIDER_SITE_OTHER)

## 2019-02-19 DIAGNOSIS — J309 Allergic rhinitis, unspecified: Secondary | ICD-10-CM | POA: Diagnosis not present

## 2019-02-27 ENCOUNTER — Encounter: Payer: Self-pay | Admitting: Gynecology

## 2019-03-09 ENCOUNTER — Telehealth: Payer: Self-pay | Admitting: Gastroenterology

## 2019-03-09 MED ORDER — OMEPRAZOLE 40 MG PO CPDR: DELAYED_RELEASE_CAPSULE | ORAL | 3 refills | Status: DC

## 2019-03-09 NOTE — Telephone Encounter (Signed)
Prescription sent to patient's pharmacy and patient notified. Patient verbalized understanding.

## 2019-03-12 ENCOUNTER — Ambulatory Visit (INDEPENDENT_AMBULATORY_CARE_PROVIDER_SITE_OTHER)

## 2019-03-12 ENCOUNTER — Other Ambulatory Visit: Payer: Self-pay | Admitting: Rehabilitation

## 2019-03-12 DIAGNOSIS — J309 Allergic rhinitis, unspecified: Secondary | ICD-10-CM | POA: Diagnosis not present

## 2019-03-12 DIAGNOSIS — M5030 Other cervical disc degeneration, unspecified cervical region: Secondary | ICD-10-CM

## 2019-03-15 ENCOUNTER — Ambulatory Visit
Admission: RE | Admit: 2019-03-15 | Discharge: 2019-03-15 | Disposition: A | Discharge status: Home / Self Care | Source: Ambulatory Visit | Attending: Rehabilitation | Admitting: Rehabilitation

## 2019-03-15 DIAGNOSIS — M5030 Other cervical disc degeneration, unspecified cervical region: Secondary | ICD-10-CM

## 2019-03-26 ENCOUNTER — Other Ambulatory Visit: Payer: Self-pay

## 2019-03-26 ENCOUNTER — Ambulatory Visit (INDEPENDENT_AMBULATORY_CARE_PROVIDER_SITE_OTHER): Admitting: *Deleted

## 2019-03-26 DIAGNOSIS — Z20822 Contact with and (suspected) exposure to covid-19: Secondary | ICD-10-CM

## 2019-03-26 DIAGNOSIS — J309 Allergic rhinitis, unspecified: Secondary | ICD-10-CM

## 2019-03-27 LAB — NOVEL CORONAVIRUS, NAA: SARS-CoV-2, NAA: NOT DETECTED

## 2019-04-04 ENCOUNTER — Telehealth: Payer: Self-pay | Admitting: Family Medicine

## 2019-04-04 NOTE — Telephone Encounter (Signed)
Negative COVID results given. Patient results "NOT Detected." Caller expressed understanding. ° °

## 2019-04-09 ENCOUNTER — Ambulatory Visit (INDEPENDENT_AMBULATORY_CARE_PROVIDER_SITE_OTHER)

## 2019-04-09 DIAGNOSIS — J309 Allergic rhinitis, unspecified: Secondary | ICD-10-CM

## 2019-04-11 ENCOUNTER — Telehealth: Payer: Self-pay | Admitting: Gastroenterology

## 2019-04-11 MED ORDER — HUMIRA PEN 40 MG/0.8ML ~~LOC~~ PNKT: 40.0000 mg | PEN_INJECTOR | SUBCUTANEOUS | 1 refills | Status: DC

## 2019-04-11 NOTE — Telephone Encounter (Signed)
rx sent

## 2019-04-16 ENCOUNTER — Ambulatory Visit (INDEPENDENT_AMBULATORY_CARE_PROVIDER_SITE_OTHER)

## 2019-04-16 DIAGNOSIS — J309 Allergic rhinitis, unspecified: Secondary | ICD-10-CM | POA: Diagnosis not present

## 2019-04-23 ENCOUNTER — Ambulatory Visit (INDEPENDENT_AMBULATORY_CARE_PROVIDER_SITE_OTHER)

## 2019-04-23 DIAGNOSIS — J309 Allergic rhinitis, unspecified: Secondary | ICD-10-CM

## 2019-04-30 ENCOUNTER — Ambulatory Visit (INDEPENDENT_AMBULATORY_CARE_PROVIDER_SITE_OTHER): Admitting: *Deleted

## 2019-04-30 DIAGNOSIS — J309 Allergic rhinitis, unspecified: Secondary | ICD-10-CM | POA: Diagnosis not present

## 2019-05-07 ENCOUNTER — Ambulatory Visit (INDEPENDENT_AMBULATORY_CARE_PROVIDER_SITE_OTHER)

## 2019-05-07 DIAGNOSIS — J309 Allergic rhinitis, unspecified: Secondary | ICD-10-CM | POA: Diagnosis not present

## 2019-05-22 ENCOUNTER — Ambulatory Visit: Payer: Self-pay

## 2019-05-22 ENCOUNTER — Ambulatory Visit (INDEPENDENT_AMBULATORY_CARE_PROVIDER_SITE_OTHER): Admitting: Allergy & Immunology

## 2019-05-22 ENCOUNTER — Other Ambulatory Visit: Payer: Self-pay

## 2019-05-22 ENCOUNTER — Encounter: Payer: Self-pay | Admitting: Allergy & Immunology

## 2019-05-22 VITALS — BP 124/70 | HR 82 | Temp 97.5°F | Resp 16 | Ht 64.0 in | Wt 174.6 lb

## 2019-05-22 DIAGNOSIS — J3089 Other allergic rhinitis: Secondary | ICD-10-CM

## 2019-05-22 DIAGNOSIS — J4540 Moderate persistent asthma, uncomplicated: Secondary | ICD-10-CM

## 2019-05-22 DIAGNOSIS — J309 Allergic rhinitis, unspecified: Secondary | ICD-10-CM

## 2019-05-22 MED ORDER — BUDESONIDE-FORMOTEROL FUMARATE 80-4.5 MCG/ACT IN AERO: INHALATION_SPRAY | RESPIRATORY_TRACT | 4 refills | Status: DC

## 2019-05-22 NOTE — Patient Instructions (Addendum)
1. Moderate persistent asthma, uncomplicated - Lung testing looks good today. - If you are doing well on the Symbicort two puffs once daily, then go ahead and keep doing that.  - Daily controller medication(s): Singulair 72m daily + Symbicort 80/4.5 two puffs once daily - Rescue medications: ProAir 4 puffs every 4-6 hours as needed  - Asthma control goals:  * Full participation in all desired activities (may need albuterol before activity) * Albuterol use two time or less a week on average (not counting use with activity) * Cough interfering with sleep two time or less a month * Oral steroids no more than once a year * No hospitalizations   2. Chronic perennial rhinitis (cockroach, dust mite, molds) - Continue with allergy shots at the same schedule.  - Continue with all of your nose sprays on an as-needed basis. - Anticipate five years of allergen immunotherapy (September 2024).   3. Return in about 6 months (around 11/20/2019). This can be an in-person, a virtual Webex or a telephone follow up visit.   Please inform [place] any Emergency Department visits, hospitalizations, or changes in symptoms. Call [place] going to the ED for breathing or allergy symptoms since we might be able to fit you in for a sick visit. Feel free to contact [place] with any questions, problems, or concerns.  It was a pleasure to see you again today!  Websites that have reliable patient information: 1. American Academy of Asthma, Allergy, and Immunology: www.aaaai.org 2. Food Allergy Research and Education (FARE): foodallergy.org 3. Mothers of Asthmatics: http://www.asthmacommunitynetwork.org 4. American College of Allergy, Asthma, and Immunology: www.acaai.org  "Like" [facility] Facebook and Instagram for our latest updates!        Make sure you are registered to vote! If you have moved or changed any of your contact information, you will need to get this updated before voting!  In some cases, you MAY  be able to register to vote online: [email or web address]

## 2019-05-22 NOTE — Progress Notes (Signed)
FOLLOW UP  Date of Service/Encounter:  05/22/19   Assessment:   Moderate persistent asthma without complication   Perennial allergic rhinitis(cockroach, molds, dust mites) - on allergen immunotherapy with maintenance reached in September 2019  Plan/Recommendations:   1. Moderate persistent asthma, uncomplicated - Lung testing looks good today. - If you are doing well on the Symbicort two puffs once daily, then go ahead and keep doing that.  - Daily controller medication(s): Singulair 59m daily + Symbicort 80/4.5 two puffs once daily - Rescue medications: ProAir 4 puffs every 4-6 hours as needed  - Asthma control goals:  * Full participation in all desired activities (may need albuterol before activity) * Albuterol use two time or less a week on average (not counting use with activity) * Cough interfering with sleep two time or less a month * Oral steroids no more than once a year * No hospitalizations  2. Chronic perennial rhinitis (cockroach, dust mite, molds) - Continue with allergy shots at the same schedule.  - Continue with all of your nose sprays on an as-needed basis. - Anticipate five years of allergen immunotherapy (September 2024).   3. Return in about 6 months (around 11/20/2019). This can be an in-person, a virtual Webex or a telephone follow up visit.   Subjective:   Brianna Sylvanis a 63y.o. female presenting today for follow up of  Chief Complaint  Patient presents with  . Asthma    DDashae Wilcherhas a history of the following: Patient Active Problem List   Diagnosis Date Noted  . Acute sinusitis 12/20/2016  . Perennial allergic rhinitis 12/20/2016  . Cough 05/10/2016  . Acute left-sided thoracic back pain 04/05/2016  . Routine general medical examination at a health care facility 05/02/2015  . Thyroid mass 01/16/2015  . Drug allergy 07/30/2014  . [place]'s disease ([place] 03/18/2014  . Osteopenia 09/11/2013  . Hyperthyroidism     . GERD 11/02/2007  . Moderate persistent asthma without complication 056/43/3295   History obtained from: chart review and patient.  DJahniais a 63y.o. female presenting for a follow up visit.  She was last seen in June 2020 via televisit.  At that time, her asthma seem to be well controlled with Symbicort 80/4.5 mcg 2 puffs twice daily in combination with montelukast.  For her rhinitis, we continued with allergy shots as well as her nose sprays on a as needed basis.  Since the last visit, she has done very well. She has been remaining very safe and has been avoiding COVID19 exposures.   Asthma/Respiratory Symptom History: She remains on the Symbicort two puffs, but she is using it only once daily.  [name]'s asthma has been well controlled. She has not required rescue medication, experienced nocturnal awakenings due to lower respiratory symptoms, nor have activities of daily living been limited. She has required no Emergency Department or Urgent Care visits for her asthma. She has required zero courses of systemic steroids for asthma exacerbations since the last visit. ACT score today is 16, indicating subpar asthma symptom control.   Allergic Rhinitis Symptom History: She is doing well on her allergy shots. She is also on her montelukast. Nose sprays have not been used in quite some time. She has not required any antibiotics at all since the last visit.    DDanyahis on allergen immunotherapy. She receives one injection. Immunotherapy script #1 contains molds, dust mites and cockroach. She currently receives 0.557mof the RED vial (1/100). She  started shots July of 2019 and reached maintenance in September of 2019.  Otherwise, there have been no changes to her past medical history, surgical history, family history, or social history. She does not have any big plans during the Christmas holiday, as she is trying to maintain social distancing.     Review of Systems  Constitutional: Negative.   Negative for chills, fever, malaise/fatigue and weight loss.  HENT: Negative.  Negative for congestion, ear discharge, ear pain and sore throat.   Eyes: Negative for pain, discharge and redness.  Respiratory: Negative for cough, sputum production, shortness of breath and wheezing.   Cardiovascular: Negative.  Negative for chest pain and palpitations.  Gastrointestinal: Negative for abdominal pain, constipation, diarrhea, heartburn, nausea and vomiting.  Skin: Negative.  Negative for itching and rash.  Neurological: Negative for dizziness and headaches.  Endo/Heme/Allergies: Negative for environmental allergies. Does not bruise/bleed easily.       Objective:   Blood pressure 124/70, pulse 82, temperature (!) 97.5 F (36.4 C), temperature source Temporal, resp. rate 16, height 5' 4"  (1.626 m), weight 174 lb 9.6 oz (79.2 kg), SpO2 96 %. Body mass index is 29.97 kg/m.   Physical Exam:  Physical Exam  Constitutional: She appears well-developed.  Very pleasant female.   HENT:  Head: Normocephalic and atraumatic.  Right Ear: Tympanic membrane, external ear and ear canal normal.  Left Ear: Tympanic membrane, external ear and ear canal normal.  Nose: Mucosal edema present. No rhinorrhea, nasal deformity or septal deviation. No epistaxis. Right sinus exhibits no maxillary sinus tenderness and no frontal sinus tenderness. Left sinus exhibits no maxillary sinus tenderness and no frontal sinus tenderness.  Mouth/Throat: Uvula is midline and oropharynx is clear and moist. Mucous membranes are not pale and not dry.  Eyes: Pupils are equal, round, and reactive to light. Conjunctivae and EOM are normal. Right eye exhibits no chemosis and no discharge. Left eye exhibits no chemosis and no discharge. Right conjunctiva is not injected. Left conjunctiva is not injected.  Cardiovascular: Normal rate, regular rhythm and normal heart sounds.  Respiratory: Effort normal and breath sounds normal. No  accessory muscle usage. No tachypnea. No respiratory distress. She has no wheezes. She has no rhonchi. She has no rales. She exhibits no tenderness.  Lymphadenopathy:    She has no cervical adenopathy.  Neurological: She is alert.  Skin: No abrasion, no petechiae and no rash noted. Rash is not papular, not vesicular and not urticarial. No erythema. No pallor.  Psychiatric: She has a normal mood and affect.     Diagnostic studies:    Spirometry: results normal (FEV1:  2.19/73%, FVC: 2.78/75%, FEV1/FVC: 79%).    Spirometry consistent with normal pattern.   Allergy Studies: none      Salvatore Marvel, MD  [facility]

## 2019-06-04 ENCOUNTER — Ambulatory Visit (INDEPENDENT_AMBULATORY_CARE_PROVIDER_SITE_OTHER): Admitting: *Deleted

## 2019-06-04 DIAGNOSIS — J309 Allergic rhinitis, unspecified: Secondary | ICD-10-CM | POA: Diagnosis not present

## 2019-06-07 ENCOUNTER — Other Ambulatory Visit: Payer: Self-pay | Admitting: Allergy & Immunology

## 2019-06-18 ENCOUNTER — Ambulatory Visit (INDEPENDENT_AMBULATORY_CARE_PROVIDER_SITE_OTHER)

## 2019-06-18 DIAGNOSIS — J309 Allergic rhinitis, unspecified: Secondary | ICD-10-CM | POA: Diagnosis not present

## 2019-07-02 ENCOUNTER — Ambulatory Visit (INDEPENDENT_AMBULATORY_CARE_PROVIDER_SITE_OTHER)

## 2019-07-02 DIAGNOSIS — J309 Allergic rhinitis, unspecified: Secondary | ICD-10-CM

## 2019-07-02 NOTE — Progress Notes (Signed)
VIAL EXP 07-01-20

## 2019-07-03 DIAGNOSIS — J3089 Other allergic rhinitis: Secondary | ICD-10-CM | POA: Diagnosis not present

## 2019-07-16 ENCOUNTER — Ambulatory Visit (INDEPENDENT_AMBULATORY_CARE_PROVIDER_SITE_OTHER)

## 2019-07-16 DIAGNOSIS — J309 Allergic rhinitis, unspecified: Secondary | ICD-10-CM

## 2019-07-30 ENCOUNTER — Ambulatory Visit (INDEPENDENT_AMBULATORY_CARE_PROVIDER_SITE_OTHER)

## 2019-07-30 DIAGNOSIS — J309 Allergic rhinitis, unspecified: Secondary | ICD-10-CM | POA: Diagnosis not present

## 2019-08-20 ENCOUNTER — Ambulatory Visit (INDEPENDENT_AMBULATORY_CARE_PROVIDER_SITE_OTHER)

## 2019-08-20 DIAGNOSIS — J309 Allergic rhinitis, unspecified: Secondary | ICD-10-CM

## 2019-08-27 ENCOUNTER — Ambulatory Visit (INDEPENDENT_AMBULATORY_CARE_PROVIDER_SITE_OTHER)

## 2019-08-27 DIAGNOSIS — J309 Allergic rhinitis, unspecified: Secondary | ICD-10-CM

## 2019-09-04 ENCOUNTER — Ambulatory Visit (INDEPENDENT_AMBULATORY_CARE_PROVIDER_SITE_OTHER)

## 2019-09-04 ENCOUNTER — Other Ambulatory Visit: Payer: Self-pay

## 2019-09-04 DIAGNOSIS — J309 Allergic rhinitis, unspecified: Secondary | ICD-10-CM | POA: Diagnosis not present

## 2019-09-04 MED ORDER — MONTELUKAST SODIUM 10 MG PO TABS: 10.0000 mg | ORAL_TABLET | Freq: Every day | ORAL | 0 refills | Status: DC

## 2019-09-12 ENCOUNTER — Ambulatory Visit (INDEPENDENT_AMBULATORY_CARE_PROVIDER_SITE_OTHER)

## 2019-09-12 DIAGNOSIS — J309 Allergic rhinitis, unspecified: Secondary | ICD-10-CM | POA: Diagnosis not present

## 2019-09-17 ENCOUNTER — Ambulatory Visit (INDEPENDENT_AMBULATORY_CARE_PROVIDER_SITE_OTHER)

## 2019-09-17 DIAGNOSIS — J309 Allergic rhinitis, unspecified: Secondary | ICD-10-CM | POA: Diagnosis not present

## 2019-10-08 ENCOUNTER — Ambulatory Visit (INDEPENDENT_AMBULATORY_CARE_PROVIDER_SITE_OTHER)

## 2019-10-08 DIAGNOSIS — J309 Allergic rhinitis, unspecified: Secondary | ICD-10-CM | POA: Diagnosis not present

## 2019-10-10 ENCOUNTER — Telehealth: Payer: Self-pay | Admitting: Gastroenterology

## 2019-10-10 ENCOUNTER — Other Ambulatory Visit (HOSPITAL_COMMUNITY): Payer: Self-pay | Admitting: Orthopaedic Surgery

## 2019-10-10 ENCOUNTER — Other Ambulatory Visit: Payer: Self-pay | Admitting: Orthopaedic Surgery

## 2019-10-10 DIAGNOSIS — M79642 Pain in left hand: Secondary | ICD-10-CM

## 2019-10-10 MED ORDER — HUMIRA PEN 40 MG/0.8ML ~~LOC~~ PNKT: 40.0000 mg | PEN_INJECTOR | SUBCUTANEOUS | 0 refills | Status: DC

## 2019-10-10 NOTE — Telephone Encounter (Signed)
Informed patient she is over due for follow up visit. Patient scheduled appt for 11/21/19 at 9:10am. Prescription sent to [facility].

## 2019-10-16 ENCOUNTER — Encounter (HOSPITAL_COMMUNITY): Payer: Self-pay

## 2019-10-16 ENCOUNTER — Emergency Department (HOSPITAL_COMMUNITY)
Admission: EM | Admit: 2019-10-16 | Discharge: 2019-10-16 | Disposition: A | Discharge status: Home / Self Care | Attending: Emergency Medicine | Admitting: Emergency Medicine

## 2019-10-16 ENCOUNTER — Other Ambulatory Visit: Payer: Self-pay

## 2019-10-16 DIAGNOSIS — Z87891 Personal history of nicotine dependence: Secondary | ICD-10-CM | POA: Insufficient documentation

## 2019-10-16 DIAGNOSIS — T7840XA Allergy, unspecified, initial encounter: Secondary | ICD-10-CM | POA: Insufficient documentation

## 2019-10-16 DIAGNOSIS — Z79899 Other long term (current) drug therapy: Secondary | ICD-10-CM | POA: Diagnosis not present

## 2019-10-16 DIAGNOSIS — J45909 Unspecified asthma, uncomplicated: Secondary | ICD-10-CM | POA: Insufficient documentation

## 2019-10-16 DIAGNOSIS — L299 Pruritus, unspecified: Secondary | ICD-10-CM | POA: Diagnosis present

## 2019-10-16 MED ORDER — SODIUM CHLORIDE 0.9 % IV BOLUS
500.0000 mL | Freq: Once | INTRAVENOUS | Status: AC
  Administered 2019-10-16: 500 mL via INTRAVENOUS

## 2019-10-16 MED ORDER — EPINEPHRINE 0.3 MG/0.3ML IJ SOAJ: 0.3000 mg | INTRAMUSCULAR | 0 refills | Status: DC | PRN

## 2019-10-16 MED ORDER — FAMOTIDINE 20 MG PO TABS: 20.0000 mg | ORAL_TABLET | Freq: Two times a day (BID) | ORAL | 0 refills | Status: DC

## 2019-10-16 MED ORDER — DIPHENHYDRAMINE HCL 50 MG/ML IJ SOLN
50.0000 mg | Freq: Once | INTRAMUSCULAR | Status: AC
  Administered 2019-10-16: 50 mg via INTRAVENOUS
  Filled 2019-10-16: qty 1

## 2019-10-16 MED ORDER — FAMOTIDINE IN NACL 20-0.9 MG/50ML-% IV SOLN
20.0000 mg | Freq: Once | INTRAVENOUS | Status: AC
  Administered 2019-10-16: 20 mg via INTRAVENOUS
  Filled 2019-10-16: qty 50

## 2019-10-16 MED ORDER — DIPHENHYDRAMINE HCL 25 MG PO TABS: 25.0000 mg | ORAL_TABLET | Freq: Four times a day (QID) | ORAL | 0 refills | Status: AC | PRN

## 2019-10-16 MED ORDER — METHYLPREDNISOLONE SODIUM SUCC 125 MG IJ SOLR
125.0000 mg | Freq: Once | INTRAMUSCULAR | Status: AC
  Administered 2019-10-16: 125 mg via INTRAVENOUS
  Filled 2019-10-16: qty 2

## 2019-10-16 NOTE — ED Triage Notes (Signed)
Pt sts generalized rash and iching. Unsure if insects are getting her.

## 2019-10-16 NOTE — Discharge Instructions (Addendum)
At this time there does not appear to be the presence of an emergent medical condition, however there is always the potential for conditions to change. Please read and follow the below instructions.  Please return to the Emergency Department immediately for any new or worsening symptoms. Please be sure to follow up with your Primary Care Provider within one week regarding your visit today; please call their office to schedule an appointment even if you are feeling better for a follow-up visit. Please continue taking the antihistamine medications Pepcid and Benadryl as prescribed to help with your itching.  These medications will make you drowsy so do not drive or perform any dangerous activities after taking them. You have also been prescribed an EpiPen in case you develop signs of anaphylaxis.  If you use an EpiPen you must call 911 and return to the emergency department immediately as the EpiPen will wear off and an allergic reaction could return.  Get help right away if: You develop symptoms of an allergic reaction. You may notice them soon after you are exposed to a substance. Symptoms may include: Flushed skin. Hives. Swelling of the eyes, lips, face, mouth, tongue, or throat. Difficulty breathing, speaking, or swallowing. Wheezing. Dizziness or light-headedness. Fainting. Pain or cramping in the abdomen. Vomiting. Diarrhea. You used epinephrine. You need more medical care even if the medicine seems to be working. This is important because anaphylaxis may happen again within 72 hours (rebound anaphylaxis). You may need more doses of epinephrine. These symptoms may represent a serious problem that is an emergency. Do not wait to see if the symptoms will go away. Do the following right away: Use the auto-injector pen as you have been instructed. Get medical help. Call your local emergency services (911 in the [place] Do not drive yourself to the hospital.  Please read the additional  information packets attached to your discharge summary.  Do not take your medicine if  develop an itchy rash, swelling in your mouth or lips, or difficulty breathing; call 911 and seek immediate emergency medical attention if this occurs.  Note: Portions of this text may have been transcribed using voice recognition software. Every effort was made to ensure accuracy; however, inadvertent computerized transcription errors may still be present.

## 2019-10-16 NOTE — ED Provider Notes (Signed)
[facility] DEPT Provider Note   CSN: [id number] Arrival date & time: 10/16/19  9166     History Chief Complaint  Patient presents with  . Pruritis    Brianna Santana is a 64 y.o. female history of asthma, osteopenia, arthritis, thyroid disease.  Patient reports that she developed a rash around 11 PM last night.  She reports diffuse erythema of her torso, legs which has been constant, she describes the rash as itchy.  She feels as if an insect may have bit her on her upper back sometime yesterday she cannot recall of the incident but has a small bump to her upper back.  She denies fever/chills, cough, chest pain/shortness of breath, swelling of the face/head/neck, difficulty swallowing, voice change, nausea/vomiting, diarrhea, abdominal pain or any additional concerns.  Patient denies any similar reactions in the past. HPI     Past Medical History:  Diagnosis Date  . Arthritis   . Asthma   . C. difficile colitis 2011 and 07/2010  . Cervical dysplasia   . Hyperplastic polyp of intestine 09/2007  . Osteopenia 11/2018   T score -2.0 FRAX 3.7% / 0.3%  . Thyroid disease   . Urinary incontinence     Patient Active Problem List   Diagnosis Date Noted  . Acute sinusitis 12/20/2016  . Perennial allergic rhinitis 12/20/2016  . Cough 05/10/2016  . Acute left-sided thoracic back pain 04/05/2016  . Routine general medical examination at a health care facility 05/02/2015  . Thyroid mass 01/16/2015  . Drug allergy 07/30/2014  . Crohn's disease ([place]) 03/18/2014  . Osteopenia 09/11/2013  . Hyperthyroidism   . GERD 11/02/2007  . Moderate persistent asthma without complication 06/00/4599    Past Surgical History:  Procedure Laterality Date  . BREAST CYST ASPIRATION Right    2000  . BREAST EXCISIONAL BIOPSY Right    1980's  . COLONOSCOPY  09/18/2013  . COLPOSCOPY    . FLEXIBLE SIGMOIDOSCOPY  03/29/2012   Procedure: FLEXIBLE SIGMOIDOSCOPY;   Surgeon: Ladene Artist, MD,FACG;  Location: [facility];  Service: Endoscopy;  Laterality: N/A;  . POLYPECTOMY    . TONSILLECTOMY    . TOTAL ABDOMINAL HYSTERECTOMY  2000     OB History    Gravida  3   Para  3   Term  3   Preterm      AB      Living  3     SAB      TAB      Ectopic      Multiple      Live Births              Family History  Problem Relation Age of Onset  . Diabetes Father   . Diabetes Sister        x 2  . Hypertension Sister        x 2  . Diabetes Brother   . Hypertension Brother   . Colon cancer Neg Hx   . Esophageal cancer Neg Hx   . Pancreatic cancer Neg Hx   . Rectal cancer Neg Hx   . Stomach cancer Neg Hx   . Colon polyps Neg Hx     Social History   Tobacco Use  . Smoking status: Former Smoker    Years: 10.00    Types: Cigarettes    Quit date: 09/07/1988    Years since quitting: 31.1  . Smokeless tobacco: Never Used  Substance Use Topics  .  Alcohol use: No    Alcohol/week: 0.0 standard drinks  . Drug use: No    Home Medications Prior to Admission medications   Medication Sig Start Date End Date Taking? Authorizing Provider  Adalimumab (HUMIRA PEN) 40 MG/0.8ML PNKT Inject 40 mg into the skin every 14 (fourteen) days. Citrate free 10/10/19  Yes Ladene Artist, MD  azelastine (ASTELIN) 0.1 % nasal spray Spray 1-2 sprays per nostril twice daily Patient taking differently: Place 1-2 sprays into both nostrils 2 (two) times daily.  12/20/16  Yes Bobbitt, Sedalia Muta, MD  Biotin 1000 MCG CHEW Chew 1 tablet by mouth daily.   Yes [provider]  budesonide-formoterol (SYMBICORT) 80-4.5 MCG/ACT inhaler INHALE 2 PUFFS INTO THE LUNGS TWICE A DAY Patient taking differently: Inhale 2 puffs into the lungs in the morning and at bedtime.  05/22/19  Yes Valentina Shaggy, MD  cetirizine (ZYRTEC) 10 MG tablet Take 1 tablet (10 mg total) by mouth daily. 02/25/16 05/21/28 Yes Valentina Shaggy, MD  Cholecalciferol  (VITAMIN D3) 2000 units TABS Take 2 tablets by mouth daily.   Yes [provider]  magnesium 30 MG tablet Take 1 tablet (30 mg total) by mouth daily. 04/29/16  Yes Hoyt Koch, MD  montelukast (SINGULAIR) 10 MG tablet Take 1 tablet (10 mg total) by mouth at bedtime. 09/04/19  Yes Valentina Shaggy, MD  Multiple Vitamin (MULTIVITAMIN WITH MINERALS) TABS Take 1 tablet by mouth daily.   Yes [provider]  omeprazole (PRILOSEC) 40 MG capsule TAKE 1 CAPSULE BY MOUTH DAILY FOR HEARTBURN Patient taking differently: Take 40 mg by mouth daily.  03/09/19  Yes Ladene Artist, MD  potassium chloride (K-DUR) 10 MEQ tablet Take 1 tablet (10 mEq total) by mouth daily. 11/21/18  Yes Ladene Artist, MD  PROAIR HFA 108 [phone] Base) MCG/ACT inhaler INHALE 4 PUFFS INTO THE LUNGS EVERY 4 HOURS AS NEEDED FOR SHORTNESS OF BREATH OR WHEEZING  Patient taking differently: Inhale 4 puffs into the lungs every 4 (four) hours as needed for wheezing or shortness of breath.  10/30/18  Yes Valentina Shaggy, MD  pyridoxine (B-6) 100 MG tablet Take 100 mg by mouth daily.   Yes [provider]  tiZANidine (ZANAFLEX) 4 MG capsule Take 4 mg by mouth at bedtime as needed for muscle spasms.    Yes [provider]  diphenhydrAMINE (BENADRYL) 25 MG tablet Take 1 tablet (25 mg total) by mouth every 6 (six) hours as needed. 10/16/19   Nuala Alpha A, PA-C  EPINEPHrine (EPIPEN 2-PAK) 0.3 mg/0.3 mL IJ SOAJ injection Inject 0.3 mLs (0.3 mg total) into the muscle as needed for anaphylaxis. 10/16/19   Nuala Alpha A, PA-C  famotidine (PEPCID) 20 MG tablet Take 1 tablet (20 mg total) by mouth 2 (two) times daily for 7 days. 10/16/19 10/23/19  Nuala Alpha A, PA-C    Allergies    Amoxicillin, Levofloxacin, Levofloxacin, Seasonal ic [cholestatin], and Tramadol  Review of Systems   Review of Systems  Constitutional: Negative.  Negative for chills and fever.  HENT: Negative.  Negative for  facial swelling, trouble swallowing and voice change.   Respiratory: Negative.  Negative for cough and shortness of breath.   Cardiovascular: Negative.  Negative for chest pain.  Gastrointestinal: Negative.  Negative for abdominal pain, nausea and vomiting.  Skin: Positive for rash.    Physical Exam Updated Vital Signs BP 130/79   Pulse 78   Temp 97.9 F (36.6 C) (Oral)  Resp 16   Ht 5' 4"  (1.626 m)   Wt 79.4 kg   SpO2 97%   BMI 30.04 kg/m   Physical Exam Constitutional:      General: She is not in acute distress.    Appearance: Normal appearance. She is well-developed. She is not ill-appearing or diaphoretic.  HENT:     Head: Normocephalic and atraumatic.     Jaw: There is normal jaw occlusion. No trismus.     Right Ear: External ear normal.     Left Ear: External ear normal.     Nose: Nose normal.     Mouth/Throat:     Comments: The patient has normal phonation and is in control of secretions. No stridor.  Midline uvula without edema. Soft palate rises symmetrically. No tonsillar erythema, swelling or exudates. Tongue protrusion is normal, floor of mouth is soft. No trismus. No creptius on neck palpation. No gingival erythema or fluctuance noted. Mucus membranes moist. No pallor noted. Eyes:     General: Vision grossly intact. Gaze aligned appropriately.     Pupils: Pupils are equal, round, and reactive to light.  Neck:     Trachea: Trachea and phonation normal. No tracheal tenderness or tracheal deviation.  Pulmonary:     Effort: Pulmonary effort is normal. No respiratory distress.  Abdominal:     General: There is no distension.     Palpations: Abdomen is soft.     Tenderness: There is no abdominal tenderness. There is no guarding or rebound.  Musculoskeletal:        General: Normal range of motion.     Cervical back: Full passive range of motion without pain, normal range of motion and neck supple. No edema.  Skin:    General: Skin is warm and dry.     Findings:  Erythema and rash present.          Comments: Diffuse erythematous macular rash of the torso and proximal thighs. - Small erythematous papule of the upper back consistent with bug bite.  Neurological:     Mental Status: She is alert.     GCS: GCS eye subscore is 4. GCS verbal subscore is 5. GCS motor subscore is 6.     Comments: Speech is clear and goal oriented, follows commands Major Cranial nerves without deficit, no facial droop Moves extremities without ataxia, coordination intact  Psychiatric:        Behavior: Behavior normal.     ED Results / Procedures / Treatments   Labs (all labs ordered are listed, but only abnormal results are displayed) Labs Reviewed - No data to display  EKG None  Radiology No results found.  Procedures Procedures (including critical care time)  Medications Ordered in ED Medications  famotidine (PEPCID) IVPB 20 mg premix (0 mg Intravenous Stopped 10/16/19 1016)  diphenhydrAMINE (BENADRYL) injection 50 mg (50 mg Intravenous Given 10/16/19 0903)  methylPREDNISolone sodium succinate (SOLU-MEDROL) 125 mg/2 mL injection 125 mg (125 mg Intravenous Given 10/16/19 0902)  sodium chloride 0.9 % bolus 500 mL (0 mLs Intravenous Stopped 10/16/19 1016)    ED Course  I have reviewed the triage vital signs and the nursing notes.  Pertinent labs & imaging results that were available during my care of the patient were reviewed by me and considered in my medical decision making (see chart for details).    MDM Rules/Calculators/A&P                     64 year old female  arrives today with allergic reaction suspected from a small bug bite to her upper back, unknown what may have bit her.  She has no symptoms to suggest anaphylaxis.  She is well-appearing and in no acute distress, no airway involvement or GI symptoms.  There is no history of tick bite and rash does not appear as erythema migrans.  Will give patient IV Benadryl and IV Pepcid to help with allergic  reaction, additionally patient denies any history of diabetes will give IV Solu-Medrol as well and reassess.  Rash consistent with allergic reaction like to bug bite. Patient denies any difficulty breathing or swallowing.  Pt has a patent airway without stridor and is handling secretions without difficulty; no angioedema. No blisters, no pustules, no warmth, no draining sinus tracts, no superficial abscesses, no bullous impetigo, no vesicles, no desquamation, no target lesions with dusky purpura or a central bulla. Not tender to touch. No concern for superimposed infection. No concern for SJS, TEN, TSS, tick borne illness, syphilis or other life-threatening condition. - On reassessment patient reports that she is feeling much better, itching greatly subsided she is requesting discharge.  Plan of care is to have patient follow-up with her allergist and PCP for reassessment.  We will have patient continue Benadryl/Pepcid and prescribe EpiPen in case of re-exposure with worsening symptoms.  At this time there does not appear to be any evidence of an acute emergency medical condition and the patient appears stable for discharge with appropriate outpatient follow up. Diagnosis was discussed with patient who verbalizes understanding of care plan and is agreeable to discharge. I have discussed return precautions with patient who verbalizes understanding. Patient encouraged to follow-up with their PCP and allergist. All questions answered.  Patient's case discussed with Dr. Johnney Killian who agrees with plan to discharge with follow-up.   Note: Portions of this report may have been transcribed using voice recognition software. Every effort was made to ensure accuracy; however, inadvertent computerized transcription errors may still be present. Final Clinical Impression(s) / ED Diagnoses Final diagnoses:  Allergic reaction, initial encounter    Rx / DC Orders ED Discharge Orders         Ordered    famotidine  (PEPCID) 20 MG tablet  2 times daily     10/16/19 1018    diphenhydrAMINE (BENADRYL) 25 MG tablet  Every 6 hours PRN     10/16/19 1018    EPINEPHrine (EPIPEN 2-PAK) 0.3 mg/0.3 mL IJ SOAJ injection  As needed     10/16/19 1018           Gari Crown 10/16/19 1021    Charlesetta Shanks, MD 10/17/19 [phone]

## 2019-10-25 ENCOUNTER — Other Ambulatory Visit: Payer: Self-pay

## 2019-10-25 ENCOUNTER — Ambulatory Visit (HOSPITAL_COMMUNITY)
Admission: RE | Admit: 2019-10-25 | Discharge: 2019-10-25 | Disposition: A | Discharge status: Home / Self Care | Source: Ambulatory Visit | Attending: Orthopaedic Surgery | Admitting: Orthopaedic Surgery

## 2019-10-25 DIAGNOSIS — M79642 Pain in left hand: Secondary | ICD-10-CM | POA: Diagnosis not present

## 2019-10-30 ENCOUNTER — Ambulatory Visit (INDEPENDENT_AMBULATORY_CARE_PROVIDER_SITE_OTHER)

## 2019-10-30 DIAGNOSIS — J309 Allergic rhinitis, unspecified: Secondary | ICD-10-CM | POA: Diagnosis not present

## 2019-11-08 ENCOUNTER — Telehealth: Payer: Self-pay

## 2019-11-08 NOTE — Telephone Encounter (Signed)
[facility] requesting medical records: [facility]  Document received: Signed medical records request  Forwarded above request to HIM. Document and fax confirmation have been placed in the faxed file for future reference.

## 2019-11-14 ENCOUNTER — Encounter: Admitting: Nurse Practitioner

## 2019-11-20 ENCOUNTER — Ambulatory Visit: Admitting: Allergy & Immunology

## 2019-11-21 ENCOUNTER — Encounter: Payer: Self-pay | Admitting: Gastroenterology

## 2019-11-21 ENCOUNTER — Ambulatory Visit (INDEPENDENT_AMBULATORY_CARE_PROVIDER_SITE_OTHER): Admitting: Gastroenterology

## 2019-11-21 ENCOUNTER — Ambulatory Visit (INDEPENDENT_AMBULATORY_CARE_PROVIDER_SITE_OTHER)

## 2019-11-21 ENCOUNTER — Other Ambulatory Visit: Payer: Self-pay | Admitting: Nurse Practitioner

## 2019-11-21 ENCOUNTER — Other Ambulatory Visit: Payer: Self-pay

## 2019-11-21 ENCOUNTER — Other Ambulatory Visit (INDEPENDENT_AMBULATORY_CARE_PROVIDER_SITE_OTHER)

## 2019-11-21 ENCOUNTER — Ambulatory Visit
Admission: RE | Admit: 2019-11-21 | Discharge: 2019-11-21 | Disposition: A | Discharge status: Home / Self Care | Source: Ambulatory Visit | Attending: Nurse Practitioner | Admitting: Nurse Practitioner

## 2019-11-21 VITALS — BP 110/74 | HR 60 | Ht 64.0 in | Wt 176.0 lb

## 2019-11-21 DIAGNOSIS — K5010 Crohn's disease of large intestine without complications: Secondary | ICD-10-CM | POA: Diagnosis not present

## 2019-11-21 DIAGNOSIS — Z1231 Encounter for screening mammogram for malignant neoplasm of breast: Secondary | ICD-10-CM

## 2019-11-21 DIAGNOSIS — J309 Allergic rhinitis, unspecified: Secondary | ICD-10-CM

## 2019-11-21 LAB — CBC WITH DIFFERENTIAL/PLATELET
Basophils Absolute: 0.1 K/uL (ref 0.0–0.1)
Basophils Relative: 2.2 % (ref 0.0–3.0)
Eosinophils Absolute: 0.2 K/uL (ref 0.0–0.7)
Eosinophils Relative: 6.3 % — ABNORMAL HIGH (ref 0.0–5.0)
HCT: 37.5 % (ref 36.0–46.0)
Hemoglobin: 12.5 g/dL (ref 12.0–15.0)
Lymphocytes Relative: 47.3 % — ABNORMAL HIGH (ref 12.0–46.0)
Lymphs Abs: 1.6 K/uL (ref 0.7–4.0)
MCHC: 33.3 g/dL (ref 30.0–36.0)
MCV: 91 fl (ref 78.0–100.0)
Monocytes Absolute: 0.5 K/uL (ref 0.1–1.0)
Monocytes Relative: 13.7 % — ABNORMAL HIGH (ref 3.0–12.0)
Neutro Abs: 1 K/uL — ABNORMAL LOW (ref 1.4–7.7)
Neutrophils Relative %: 30.5 % — ABNORMAL LOW (ref 43.0–77.0)
Platelets: 296 K/uL (ref 150.0–400.0)
RBC: 4.12 Mil/uL (ref 3.87–5.11)
RDW: 13.3 % (ref 11.5–15.5)
WBC: 3.3 K/uL — ABNORMAL LOW (ref 4.0–10.5)

## 2019-11-21 LAB — COMPREHENSIVE METABOLIC PANEL WITH GFR
ALT: 16 U/L (ref 0–35)
AST: 15 U/L (ref 0–37)
Albumin: 4.5 g/dL (ref 3.5–5.2)
Alkaline Phosphatase: 86 U/L (ref 39–117)
BUN: 16 mg/dL (ref 6–23)
CO2: 28 meq/L (ref 19–32)
Calcium: 9.7 mg/dL (ref 8.4–10.5)
Chloride: 106 meq/L (ref 96–112)
Creatinine, Ser: 0.66 mg/dL (ref 0.40–1.20)
GFR: 109.03 mL/min
Glucose, Bld: 96 mg/dL (ref 70–99)
Potassium: 4.1 meq/L (ref 3.5–5.1)
Sodium: 139 meq/L (ref 135–145)
Total Bilirubin: 0.6 mg/dL (ref 0.2–1.2)
Total Protein: 7.4 g/dL (ref 6.0–8.3)

## 2019-11-21 LAB — SEDIMENTATION RATE: Sed Rate: 19 mm/hr (ref 0–30)

## 2019-11-21 LAB — HIGH SENSITIVITY CRP: CRP, High Sensitivity: 2.35 mg/L (ref 0.000–5.000)

## 2019-11-21 MED ORDER — HUMIRA PEN 40 MG/0.8ML ~~LOC~~ PNKT: 40.0000 mg | PEN_INJECTOR | SUBCUTANEOUS | 0 refills | Status: DC

## 2019-11-21 NOTE — Progress Notes (Signed)
    History of Present Illness: This is a 64 year female Crohn's colitis.  She has no active gastrointestinal complaints, maintained on Humira.  Colonoscopy 10/2018 - The examined portion of the ileum was normal. - Localized mild inflammation was found in the cecum secondary to colitis. Biopsied. - Quiescent Crohn's disease. Inflammation was found from the sigmoid colon to the descending colon. This was moderate in severity, unchanged compared to previous examinations. Biopsied. - Mild diverticulosis in the left colon. There was no evidence of diverticular bleeding. - Internal hemorrhoids. - The examination was otherwise normal on direct and retroflexion views. Path: Right colon biopsy showed minimally active chronic colitis and the other biopsies were unremarkable  Current Medications, Allergies, Past Medical History, Past Surgical History, Family History and Social History were reviewed in [employer] record.   Physical Exam: General: Well developed, well nourished, no acute distress Head: Normocephalic and atraumatic Eyes:  sclerae anicteric, EOMI Ears: Normal auditory acuity Mouth: Not examined, mask on during Covid-19 pandemic Lungs: Clear throughout to auscultation Heart: Regular rate and rhythm; no murmurs, rubs or bruits Abdomen: Soft, non tender and non distended. No masses, hepatosplenomegaly or hernias noted. Normal Bowel sounds Rectal: Not done  Musculoskeletal: Symmetrical with no gross deformities  Pulses:  Normal pulses noted Extremities: No clubbing, cyanosis, edema or deformities noted Neurological: Alert oriented x 4, grossly nonfocal Psychological:  Alert and cooperative. Normal mood and affect   Assessment and Recommendations:  1.  Crohn's colitis, stable on Humira.  CBC, CMP, TSH, ESR, CRP, QuantiFERON gold, HepBsAg today.  Continue Humira at current dosing schedule.  REV in 6 months.

## 2019-11-21 NOTE — Patient Instructions (Addendum)
Your provider has requested that you go to the basement level for lab work before leaving today. Press "B" on the elevator. The lab is located at the first door on the left as you exit the elevator.  We have sent the following medications to your pharmacy for you to pick up at your convenience: Humira   You will need follow-up in 6 months. Office will call to schedule.   If you are age 64 or older, your body mass index should be between 23-30. Your Body mass index is 30.21 kg/m. If this is out of the aforementioned range listed, please consider follow up with your Primary Care Provider.  If you are age 77 or younger, your body mass index should be between 19-25. Your Body mass index is 30.21 kg/m. If this is out of the aformentioned range listed, please consider follow up with your Primary Care Provider.   Due to recent changes in healthcare laws, you may see the results of your imaging and laboratory studies on MyChart before your provider has had a chance to review them.  We understand that in some cases there may be results that are confusing or concerning to you. Not all laboratory results come back in the same time frame and the provider may be waiting for multiple results in order to interpret others.  Please give [name] 48 hours in order for your provider to thoroughly review all the results before contacting the office for clarification of your results.   Thank you for choosing me and [facility].  Pricilla Riffle. Dagoberto Ligas., MD., Marval Regal

## 2019-11-22 ENCOUNTER — Ambulatory Visit (INDEPENDENT_AMBULATORY_CARE_PROVIDER_SITE_OTHER): Admitting: Nurse Practitioner

## 2019-11-22 ENCOUNTER — Encounter: Payer: Self-pay | Admitting: Nurse Practitioner

## 2019-11-22 ENCOUNTER — Other Ambulatory Visit: Payer: Self-pay

## 2019-11-22 VITALS — BP 140/80 | Ht 64.0 in | Wt 176.0 lb

## 2019-11-22 DIAGNOSIS — Z90710 Acquired absence of both cervix and uterus: Secondary | ICD-10-CM | POA: Diagnosis not present

## 2019-11-22 DIAGNOSIS — Z01419 Encounter for gynecological examination (general) (routine) without abnormal findings: Secondary | ICD-10-CM

## 2019-11-22 DIAGNOSIS — M8589 Other specified disorders of bone density and structure, multiple sites: Secondary | ICD-10-CM | POA: Diagnosis not present

## 2019-11-22 NOTE — Patient Instructions (Signed)
Health Maintenance, Female Adopting a healthy lifestyle and getting preventive care are important in promoting health and wellness. Ask your health care provider about:  The right schedule for you to have regular tests and exams.  Things you can do on your own to prevent diseases and keep yourself healthy. What should I know about diet, weight, and exercise? Eat a healthy diet   Eat a diet that includes plenty of vegetables, fruits, low-fat dairy products, and lean protein.  Do not eat a lot of foods that are high in solid fats, added sugars, or sodium. Maintain a healthy weight Body mass index (BMI) is used to identify weight problems. It estimates body fat based on height and weight. Your health care provider can help determine your BMI and help you achieve or maintain a healthy weight. Get regular exercise Get regular exercise. This is one of the most important things you can do for your health. Most adults should:  Exercise for at least 150 minutes each week. The exercise should increase your heart rate and make you sweat (moderate-intensity exercise).  Do strengthening exercises at least twice a week. This is in addition to the moderate-intensity exercise.  Spend less time sitting. Even light physical activity can be beneficial. Watch cholesterol and blood lipids Have your blood tested for lipids and cholesterol at 64 years of age, then have this test every 5 years. Have your cholesterol levels checked more often if:  Your lipid or cholesterol levels are high.  You are older than 64 years of age.  You are at high risk for heart disease. What should I know about cancer screening? Depending on your health history and family history, you may need to have cancer screening at various ages. This may include screening for:  Breast cancer.  Cervical cancer.  Colorectal cancer.  Skin cancer.  Lung cancer. What should I know about heart disease, diabetes, and high blood  pressure? Blood pressure and heart disease  High blood pressure causes heart disease and increases the risk of stroke. This is more likely to develop in people who have high blood pressure readings, are of African descent, or are overweight.  Have your blood pressure checked: ? Every 3-5 years if you are 18-39 years of age. ? Every year if you are 40 years old or older. Diabetes Have regular diabetes screenings. This checks your fasting blood sugar level. Have the screening done:  Once every three years after age 40 if you are at a normal weight and have a low risk for diabetes.  More often and at a younger age if you are overweight or have a high risk for diabetes. What should I know about preventing infection? Hepatitis B If you have a higher risk for hepatitis B, you should be screened for this virus. Talk with your health care provider to find out if you are at risk for hepatitis B infection. Hepatitis C Testing is recommended for:  Everyone born from 1945 through 1965.  Anyone with known risk factors for hepatitis C. Sexually transmitted infections (STIs)  Get screened for STIs, including gonorrhea and chlamydia, if: ? You are sexually active and are younger than 64 years of age. ? You are older than 64 years of age and your health care provider tells you that you are at risk for this type of infection. ? Your sexual activity has changed since you were last screened, and you are at increased risk for chlamydia or gonorrhea. Ask your health care provider if   you are at risk.  Ask your health care provider about whether you are at high risk for HIV. Your health care provider may recommend a prescription medicine to help prevent HIV infection. If you choose to take medicine to prevent HIV, you should first get tested for HIV. You should then be tested every 3 months for as long as you are taking the medicine. Pregnancy  If you are about to stop having your period (premenopausal) and  you may become pregnant, seek counseling before you get pregnant.  Take 400 to 800 micrograms (mcg) of folic acid every day if you become pregnant.  Ask for birth control (contraception) if you want to prevent pregnancy. Osteoporosis and menopause Osteoporosis is a disease in which the bones lose minerals and strength with aging. This can result in bone fractures. If you are 65 years old or older, or if you are at risk for osteoporosis and fractures, ask your health care provider if you should:  Be screened for bone loss.  Take a calcium or vitamin D supplement to lower your risk of fractures.  Be given hormone replacement therapy (HRT) to treat symptoms of menopause. Follow these instructions at home: Lifestyle  Do not use any products that contain nicotine or tobacco, such as cigarettes, e-cigarettes, and chewing tobacco. If you need help quitting, ask your health care provider.  Do not use street drugs.  Do not share needles.  Ask your health care provider for help if you need support or information about quitting drugs. Alcohol use  Do not drink alcohol if: ? Your health care provider tells you not to drink. ? You are pregnant, may be pregnant, or are planning to become pregnant.  If you drink alcohol: ? Limit how much you use to 0-1 drink a day. ? Limit intake if you are breastfeeding.  Be aware of how much alcohol is in your drink. In the [place] one drink equals one 12 oz bottle of beer (355 mL), one 5 oz glass of wine (148 mL), or one 1 oz glass of hard liquor (44 mL). General instructions  Schedule regular health, dental, and eye exams.  Stay current with your vaccines.  Tell your health care provider if: ? You often feel depressed. ? You have ever been abused or do not feel safe at home. Summary  Adopting a healthy lifestyle and getting preventive care are important in promoting health and wellness.  Follow your health care provider's instructions about healthy  diet, exercising, and getting tested or screened for diseases.  Follow your health care provider's instructions on monitoring your cholesterol and blood pressure. This information is not intended to replace advice given to you by your health care provider. Make sure you discuss any questions you have with your health care provider. Document Revised: 05/10/2018 Document Reviewed: 05/10/2018 Elsevier Patient Education  2020 Elsevier Inc.  

## 2019-11-22 NOTE — Progress Notes (Signed)
   Brianna Santana July 24, 1955 [id number]   History:  64 y.o. MBF G3 P3 presents for annual exam without GYN complaints. 2002 TVH for fibroids. Normal pap and mammogram history. Crohn's and GERD followed by GI. Primary care manages hypothyroidism, asthma.   Gynecologic History No LMP recorded. Patient has had a hysterectomy.   Contraception: status post hysterectomy Last Pap: no longer screening per guidelines and pt request Last mammogram: 11/21/2019 Results were: not available yet Last colonoscopy: 11/07/2018. Results were: inflammation consistent with Crohn's disease, repeat in 5 years Last Dexa: 12/14/2018. Results were: T-score -1.5 FRAX 3.7% / 0.3%  Past medical history, past surgical history, family history and social history were all reviewed and documented in the EPIC chart.  ROS:  A ROS was performed and pertinent positives and negatives are included.  Exam:  Vitals:   11/22/19 1002  BP: 140/80  Weight: 176 lb (79.8 kg)  Height: 5' 4"  (1.626 m)   Body mass index is 30.21 kg/m.  General appearance:  Normal Thyroid:  Symmetrical, normal in size, without palpable masses or nodularity. Respiratory  Auscultation:  Clear without wheezing or rhonchi Cardiovascular  Auscultation:  Regular rate, without rubs, murmurs or gallops  Edema/varicosities:  Not grossly evident Abdominal  Soft,nontender, without masses, guarding or rebound.  Liver/spleen:  No organomegaly noted  Hernia:  None appreciated  Skin  Inspection:  Grossly normal   Breasts: Examined lying and sitting.   Right: Without masses, retractions, discharge or axillary adenopathy.   Left: Without masses, retractions, discharge or axillary adenopathy. Gentitourinary   Inguinal/mons:  Normal without inguinal adenopathy  External genitalia:  Normal  BUS/Urethra/Skene's glands:  Normal  Vagina:  Normal  Cervix:  Absent  Uterus:  Absent  Adnexa/parametria:     Rt: Without masses or tenderness.   Lt: Without  masses or tenderness.  Anus and perineum: Normal  Digital rectal exam: Normal sphincter tone without palpated masses or tenderness  Assessment/Plan:  64 y.o. G3 P3 for annual exam.   Well female exam with routine gynecological exam - Education provided on SBEs, importance of preventative screenings, current guidelines, high calcium diet, regular exercise, and multivitamin daily.   History of total vaginal hysterectomy (TVH) - due to fibroids, no HRT  Osteopenia of multiple sites - Continue 2000 units Vitamin D daily. Regular exercise and incorporate weightbearing exercises. Fall prevention and home safety.   Follow up in 1 year for annual      [place], 10:07 AM 11/22/2019

## 2019-11-23 LAB — QUANTIFERON-TB GOLD PLUS
Mitogen-NIL: >10 IU/mL
NIL: 0.03 IU/mL
QuantiFERON-TB Gold Plus: NEGATIVE
TB1-NIL: 0.01 IU/mL
TB2-NIL: 0.01 IU/mL

## 2019-11-23 LAB — HEPATITIS B SURFACE ANTIGEN: Hepatitis B Surface Ag: NONREACTIVE

## 2019-11-27 ENCOUNTER — Ambulatory Visit (INDEPENDENT_AMBULATORY_CARE_PROVIDER_SITE_OTHER): Admitting: Allergy & Immunology

## 2019-11-27 ENCOUNTER — Encounter: Payer: Self-pay | Admitting: Allergy & Immunology

## 2019-11-27 ENCOUNTER — Other Ambulatory Visit: Payer: Self-pay

## 2019-11-27 VITALS — BP 112/68 | HR 87 | Temp 97.5°F | Resp 16 | Ht 64.0 in | Wt 175.8 lb

## 2019-11-27 DIAGNOSIS — J3089 Other allergic rhinitis: Secondary | ICD-10-CM | POA: Diagnosis not present

## 2019-11-27 DIAGNOSIS — J4540 Moderate persistent asthma, uncomplicated: Secondary | ICD-10-CM

## 2019-11-27 NOTE — Progress Notes (Signed)
FOLLOW UP  Date of Service/Encounter:  11/27/19   Assessment:   Moderate persistent asthma without complication  Perennial allergic rhinitis(cockroach, molds, dust mites)- on allergen immunotherapy with maintenance reached in September 2019  Vaccinated to [place]  Plan/Recommendations:   1. Moderate persistent asthma, uncomplicated - Lung testing looks good today. - We are not going to make any medication changes at that time.  - Daily controller medication(s): Singulair 61m daily + Symbicort 80/4.5 two puffs once daily - Rescue medications: ProAir 4 puffs every 4-6 hours as needed  - Asthma control goals:  * Full participation in all desired activities (may need albuterol before activity) * Albuterol use two time or less a week on average (not counting use with activity) * Cough interfering with sleep two time or less a month * Oral steroids no more than once a year * No hospitalizations  2. Chronic perennial rhinitis (cockroach, dust mite, molds) - Continue with allergy shots at the same schedule.  - Continue with all of your nose sprays on an as-needed basis. - Anticipate five years of allergen immunotherapy (September 2024).   3. Return in about 6 months (around 05/28/2020). This can be an in-person, a virtual Webex or a telephone follow up visit.   Subjective:   Brianna Prewittis a 64y.o. female presenting today for follow up of  Chief Complaint  Patient presents with  . Asthma    DMozel Burdetthas a history of the following: Patient Active Problem List   Diagnosis Date Noted  . Acute sinusitis 12/20/2016  . Perennial allergic rhinitis 12/20/2016  . Cough 05/10/2016  . Acute left-sided thoracic back pain 04/05/2016  . Routine general medical examination at a health care facility 05/02/2015  . Thyroid mass 01/16/2015  . Drug allergy 07/30/2014  . Crohn's disease (HJuana Diaz 03/18/2014  . Osteopenia 09/11/2013  . Hyperthyroidism   . GERD  11/02/2007  . Moderate persistent asthma without complication 070/62/3762   History obtained from: chart review and patient.  Brianna Santana a 64y.o. female presenting for a follow up visit.  She was last seen in December 2020.  At that time, her lung testing looked great.  She was doing very well on Symbicort 2 puffs once daily, so we continued her on that.  Best continue with albuterol 4 puffs every 4-6 hours as needed.  For her allergic rhinitis, we continue with allergy shots at the same schedule.   Since last visit, she has done very well for the most part.  Asthma/Respiratory Symptom History: Asthma has been well controlled. She remains on the Symbicort two puffs once daily. This is controlling her symptoms well.   Allergic Rhinitis Symptom History: She did have one episode where she went to the Urgent Care and she was placed on Flonase. That helped. She was unable to get in here, unfortunately. She did not need antibiotics at all.   Dorothyis on allergen immunotherapy. Shereceives one injection. Immunotherapy script #1contains molds, dust mites and cockroach.Shecurrently receives 0.539mf the RED vial (1/100).Shestarted [facility] Julyof 2019and [facility]  She was vaccinated and tolerated this without adverse event.   Otherwise, there have been no changes to her past medical history, surgical history, family history, or social history.    Review of Systems  Constitutional: Negative.  Negative for fever, malaise/fatigue and weight loss.  HENT: Negative.  Negative for congestion, ear discharge, ear pain and sore throat.   Eyes: Negative for pain, discharge and redness.  Respiratory: Negative for cough, sputum production, shortness of breath and wheezing.   Cardiovascular: Negative.  Negative for chest pain and palpitations.  Gastrointestinal: Negative for abdominal pain, constipation, diarrhea, heartburn, nausea and vomiting.  Skin: Negative.   Negative for itching and rash.  Neurological: Negative for dizziness and headaches.  Endo/Heme/Allergies: Negative for environmental allergies. Does not bruise/bleed easily.       Objective:   Blood pressure 112/68, pulse 87, temperature (!) 97.5 F (36.4 C), temperature source Temporal, resp. rate 16, height 5' 4"  (1.626 m), weight 175 lb 12.8 oz (79.7 kg), SpO2 95 %. Body mass index is 30.18 kg/m.   Physical Exam:  Physical Exam Constitutional:      Appearance: She is well-developed.     Comments: Very pleasant female. Cooperative with the exam.   HENT:     Head: Normocephalic and atraumatic.     Right Ear: Tympanic membrane, ear canal and external ear normal.     Left Ear: Tympanic membrane and ear canal normal.     Nose: No nasal deformity, septal deviation, mucosal edema or rhinorrhea.     Right Turbinates: Enlarged and swollen.     Left Turbinates: Enlarged and swollen.     Right Sinus: No maxillary sinus tenderness or frontal sinus tenderness.     Left Sinus: No maxillary sinus tenderness or frontal sinus tenderness.     Mouth/Throat:     Mouth: Mucous membranes are not pale and not dry.     Pharynx: Uvula midline.     Comments: Cobblestoning present in the posterior oropharynx.  Eyes:     General:        Right eye: No discharge.        Left eye: No discharge.     Conjunctiva/sclera: Conjunctivae normal.     Right eye: Right conjunctiva is not injected. No chemosis.    Left eye: Left conjunctiva is not injected. No chemosis.    Pupils: Pupils are equal, round, and reactive to light.  Cardiovascular:     Rate and Rhythm: Normal rate and regular rhythm.     Heart sounds: Normal heart sounds.  Pulmonary:     Effort: Pulmonary effort is normal. No tachypnea, accessory muscle usage or respiratory distress.     Breath sounds: Normal breath sounds. No wheezing, rhonchi or rales.  Chest:     Chest wall: No tenderness.  Lymphadenopathy:     Cervical: No cervical  adenopathy.  Skin:    Coloration: Skin is not pale.     Findings: No abrasion, erythema, petechiae or rash. Rash is not papular, urticarial or vesicular.  Neurological:     Mental Status: She is alert.      Diagnostic studies:    Spirometry: results normal (FEV1: 2.21/110%, FVC: 2.75/107%, FEV1/FVC: 80%).    Spirometry consistent with normal pattern.   Allergy Studies: none   Salvatore Marvel, MD  [facility]

## 2019-11-27 NOTE — Patient Instructions (Addendum)
1. Moderate persistent asthma, uncomplicated - Lung testing looks good today. - We are not going to make any medication changes at that time.  - Daily controller medication(s): Singulair 45m daily + Symbicort 80/4.5 two puffs once daily - Rescue medications: ProAir 4 puffs every 4-6 hours as needed  - Asthma control goals:  * Full participation in all desired activities (may need albuterol before activity) * Albuterol use two time or less a week on average (not counting use with activity) * Cough interfering with sleep two time or less a month * Oral steroids no more than once a year * No hospitalizations  2. Chronic perennial rhinitis (cockroach, dust mite, molds) - Continue with allergy shots at the same schedule.  - Continue with all of your nose sprays on an as-needed basis. - Anticipate five years of allergen immunotherapy (September 2024).   3. Return in about 6 months (around 05/28/2020). This can be an in-person, a virtual Webex or a telephone follow up visit.   Please inform u[place]of any Emergency Department visits, hospitalizations, or changes in symptoms. Call u[place]abefore going to the ED for breathing or allergy symptoms since we might be able to fit you in for a sick visit. Feel free to contact u[place]anytime with any questions, problems, or concerns.  It was a pleasure to see you again today!  Websites that have reliable patient information: 1. American Academy of Asthma, Allergy, and Immunology: www.aaaai.org 2. Food Allergy Research and Education (FARE): foodallergy.org 3. Mothers of Asthmatics: http://www.asthmacommunitynetwork.org 4. American College of Allergy, Asthma, and Immunology: www.acaai.org   COVID-19 Vaccine Information can be found at: [email or web address] questions related to vaccine distribution or appointments, please email [email or web address] or call [phone]     "Like" u[place]on Facebook and  Instagram for our latest updates!        Make sure you are registered to vote! If you have moved or changed any of your contact information, you will need to get this updated before voting!  In some cases, you MAY be able to register to vote online: [email or web address]

## 2019-12-12 ENCOUNTER — Ambulatory Visit (INDEPENDENT_AMBULATORY_CARE_PROVIDER_SITE_OTHER)

## 2019-12-12 DIAGNOSIS — J309 Allergic rhinitis, unspecified: Secondary | ICD-10-CM

## 2019-12-13 ENCOUNTER — Other Ambulatory Visit: Payer: Self-pay | Admitting: Allergy & Immunology

## 2019-12-25 ENCOUNTER — Telehealth: Payer: Self-pay | Admitting: Allergy & Immunology

## 2019-12-25 ENCOUNTER — Other Ambulatory Visit: Payer: Self-pay

## 2019-12-25 MED ORDER — MONTELUKAST SODIUM 10 MG PO TABS: 10.0000 mg | ORAL_TABLET | Freq: Every day | ORAL | 1 refills | Status: DC

## 2019-12-25 NOTE — Telephone Encounter (Signed)
Patient needs refill on montelukast. Patient called pharmacy a week ago but has not heard anything. Patient would like to know if medication can be called into [facility].   Please advise.

## 2019-12-25 NOTE — Telephone Encounter (Signed)
Informed pt of the refill being sent to [facility]

## 2020-01-02 ENCOUNTER — Ambulatory Visit (INDEPENDENT_AMBULATORY_CARE_PROVIDER_SITE_OTHER): Admitting: *Deleted

## 2020-01-02 DIAGNOSIS — J309 Allergic rhinitis, unspecified: Secondary | ICD-10-CM | POA: Diagnosis not present

## 2020-01-16 DIAGNOSIS — J3089 Other allergic rhinitis: Secondary | ICD-10-CM

## 2020-01-16 NOTE — Progress Notes (Signed)
VIAL EXP 01-15-21

## 2020-01-21 ENCOUNTER — Ambulatory Visit (INDEPENDENT_AMBULATORY_CARE_PROVIDER_SITE_OTHER)

## 2020-01-21 DIAGNOSIS — J309 Allergic rhinitis, unspecified: Secondary | ICD-10-CM | POA: Diagnosis not present

## 2020-02-11 ENCOUNTER — Ambulatory Visit (INDEPENDENT_AMBULATORY_CARE_PROVIDER_SITE_OTHER)

## 2020-02-11 DIAGNOSIS — J309 Allergic rhinitis, unspecified: Secondary | ICD-10-CM | POA: Diagnosis not present

## 2020-02-14 ENCOUNTER — Telehealth: Payer: Self-pay

## 2020-02-14 ENCOUNTER — Other Ambulatory Visit: Payer: Self-pay

## 2020-02-14 MED ORDER — ALBUTEROL SULFATE HFA 108 (90 BASE) MCG/ACT IN AERS: INHALATION_SPRAY | RESPIRATORY_TRACT | 1 refills | Status: DC

## 2020-02-14 MED ORDER — BUDESONIDE-FORMOTEROL FUMARATE 80-4.5 MCG/ACT IN AERO: INHALATION_SPRAY | RESPIRATORY_TRACT | 4 refills | Status: DC

## 2020-02-14 NOTE — Telephone Encounter (Signed)
Refills sent to requested pharmacy.

## 2020-02-14 NOTE — Telephone Encounter (Signed)
Patient is calling to see if she can get a refill on her Albuterol Inhaler & Symbicort 80?  Patient is requesting her refill to be sent Mail  Order [facility])..  Please Advise.

## 2020-02-19 ENCOUNTER — Ambulatory Visit: Attending: Internal Medicine

## 2020-02-19 DIAGNOSIS — Z23 Encounter for immunization: Secondary | ICD-10-CM

## 2020-02-19 NOTE — Progress Notes (Signed)
   Covid-19 Vaccination Clinic  Name:  Lorrine Killilea    MRN: [id number] DOB: 1955/06/10  02/19/2020  Ms. Hursey was observed post Covid-19 immunization for 15 minutes without incident. She was provided with Vaccine Information Sheet and instruction to access the V-Safe system.   Ms. Apple was instructed to call 911 with any severe reactions post vaccine: Marland Kitchen Difficulty breathing  . Swelling of face and throat  . A fast heartbeat  . A bad rash all over body  . Dizziness and weakness

## 2020-02-22 ENCOUNTER — Ambulatory Visit (INDEPENDENT_AMBULATORY_CARE_PROVIDER_SITE_OTHER)

## 2020-02-22 DIAGNOSIS — J309 Allergic rhinitis, unspecified: Secondary | ICD-10-CM

## 2020-02-26 ENCOUNTER — Ambulatory Visit (INDEPENDENT_AMBULATORY_CARE_PROVIDER_SITE_OTHER): Admitting: *Deleted

## 2020-02-26 DIAGNOSIS — J309 Allergic rhinitis, unspecified: Secondary | ICD-10-CM | POA: Diagnosis not present

## 2020-02-28 ENCOUNTER — Other Ambulatory Visit: Payer: Self-pay | Admitting: Family Medicine

## 2020-02-28 DIAGNOSIS — E041 Nontoxic single thyroid nodule: Secondary | ICD-10-CM

## 2020-03-10 ENCOUNTER — Ambulatory Visit (INDEPENDENT_AMBULATORY_CARE_PROVIDER_SITE_OTHER)

## 2020-03-10 DIAGNOSIS — J309 Allergic rhinitis, unspecified: Secondary | ICD-10-CM | POA: Diagnosis not present

## 2020-03-13 ENCOUNTER — Ambulatory Visit
Admission: RE | Admit: 2020-03-13 | Discharge: 2020-03-13 | Disposition: A | Discharge status: Home / Self Care | Source: Ambulatory Visit | Attending: Family Medicine | Admitting: Family Medicine

## 2020-03-13 DIAGNOSIS — E041 Nontoxic single thyroid nodule: Secondary | ICD-10-CM

## 2020-03-17 ENCOUNTER — Ambulatory Visit (INDEPENDENT_AMBULATORY_CARE_PROVIDER_SITE_OTHER)

## 2020-03-17 DIAGNOSIS — J309 Allergic rhinitis, unspecified: Secondary | ICD-10-CM

## 2020-03-18 ENCOUNTER — Telehealth: Payer: Self-pay | Admitting: Gastroenterology

## 2020-03-18 MED ORDER — OMEPRAZOLE 40 MG PO CPDR: DELAYED_RELEASE_CAPSULE | ORAL | 0 refills | Status: DC

## 2020-03-18 NOTE — Telephone Encounter (Signed)
Prescription sent to patient's mail order pharmacy. Patient verbalized understanding.

## 2020-03-25 ENCOUNTER — Other Ambulatory Visit: Payer: Self-pay

## 2020-03-25 ENCOUNTER — Ambulatory Visit (INDEPENDENT_AMBULATORY_CARE_PROVIDER_SITE_OTHER): Admitting: Orthopaedic Surgery

## 2020-03-25 ENCOUNTER — Encounter: Payer: Self-pay | Admitting: Orthopaedic Surgery

## 2020-03-25 DIAGNOSIS — M65839 Other synovitis and tenosynovitis, unspecified forearm: Secondary | ICD-10-CM | POA: Diagnosis not present

## 2020-03-25 DIAGNOSIS — M65312 Trigger thumb, left thumb: Secondary | ICD-10-CM

## 2020-03-25 NOTE — Progress Notes (Signed)
Office Visit Note   Patient: Brianna Santana           Date of Birth: July 20, 1955           MRN: [id number] Visit Date: 03/25/2020              Requested by: Cari Caraway, [place],  [place] [address] PCP: Cari Caraway, MD   Assessment & Plan: Visit Diagnoses:  1. Trigger thumb, left thumb   2. Stenosing tenosynovitis of wrist     Plan: Injection performed for trigger thumb and also stenosing tenosynovitis first dorsal compartment left wrist.  She tolerated the injections well.  Follow-Up Instructions: No follow-ups on file.   Orders:  Orders Placed This Encounter  Procedures  . Hand/UE Inj  . Hand/UE Inj: L thumb A1   No orders of the defined types were placed in this encounter.     Procedures: Hand/UE Inj for de Quervain's tenosynovitis on 03/25/2020 10:47 AM Details: radial approach Medications: 0.3 mL lidocaine 1 %; 0.5 mL bupivacaine 0.5 %; 40 mg methylPREDNISolone acetate 40 MG/ML  Hand/UE Inj: L thumb A1 for trigger finger on 03/25/2020 10:49 AM Medications: 0.3 mL lidocaine 1 %; 0.33 mL bupivacaine 0.5 %      Clinical Data: No additional findings.   Subjective: Chief Complaint  Patient presents with  . Left Hand - Pain  . Left Arm - Pain    HPI 64 year old female seen with pain in her hand.  She had recent MRI which showed ganglion cyst adjacent to the flexor tendon of her thumb 5.5 mm along the ulnar aspect of flexor tendon superficial to the pulley.  She has had a locking of her thumb and points to the A1 pulley where she has pain.  She is also had pain dorsally on her wrist and states at times when she grabs something with her thumb she feels electric shock and points to the first dorsal compartment.  [facility].  She states she would like to transfer care here.  Review of Systems all other systems are noncontributory to HPI.   Objective: Vital Signs: BP 134/76   Ht 5' 3"  (1.6 m)    Wt 175 lb (79.4 kg)   BMI 31.00 kg/m   Physical Exam Constitutional:      Appearance: She is well-developed.  HENT:     Head: Normocephalic.     Right Ear: External ear normal.     Left Ear: External ear normal.  Eyes:     Pupils: Pupils are equal, round, and reactive to light.  Neck:     Thyroid: No thyromegaly.     Trachea: No tracheal deviation.  Cardiovascular:     Rate and Rhythm: Normal rate.  Pulmonary:     Effort: Pulmonary effort is normal.  Abdominal:     Palpations: Abdomen is soft.  Skin:    General: Skin is warm and dry.  Neurological:     Mental Status: She is alert and oriented to person, place, and time.  Psychiatric:        Behavior: Behavior normal.     Ortho Exam patient demonstrates active triggering right thumb extreme tenderness of the A1 pulley to the also tender over the ganglion adjacent in the flexor tendon along the ulnar aspect of the thumb near the proximal phalanx.  Sensation the tip of the thumb is intact.  No thenar atrophy.  Exquisite tenderness over  the first dorsal compartment with positive Wynn Maudlin test she hollers and cries out with the test.  Specialty Comments:  No specialty comments available.  Imaging: No results found.   PMFS History: Patient Active Problem List   Diagnosis Date Noted  . Trigger thumb, left thumb 03/26/2020  . Stenosing tenosynovitis of wrist 03/26/2020  . Acute sinusitis 12/20/2016  . Perennial allergic rhinitis 12/20/2016  . Cough 05/10/2016  . Acute left-sided thoracic back pain 04/05/2016  . Routine general medical examination at a health care facility 05/02/2015  . Thyroid mass 01/16/2015  . Drug allergy 07/30/2014  . Crohn's disease ([place]) 03/18/2014  . Osteopenia 09/11/2013  . Hyperthyroidism   . GERD 11/02/2007  . Moderate persistent asthma without complication 00/86/7619   Past Medical History:  Diagnosis Date  . Arthritis   . Asthma   . C. difficile colitis 2011 and 07/2010  .  Cervical dysplasia   . Hyperplastic polyp of intestine 09/2007  . Osteopenia 11/2018   T score -2.0 FRAX 3.7% / 0.3%  . Thyroid disease   . Urinary incontinence     Family History  Problem Relation Age of Onset  . Diabetes Father   . Diabetes Sister        x 2  . Hypertension Sister        x 2  . Diabetes Brother   . Hypertension Brother   . Colon cancer Neg Hx   . Esophageal cancer Neg Hx   . Pancreatic cancer Neg Hx   . Rectal cancer Neg Hx   . Stomach cancer Neg Hx   . Colon polyps Neg Hx     Past Surgical History:  Procedure Laterality Date  . BREAST CYST ASPIRATION Right    2000  . BREAST EXCISIONAL BIOPSY Right    1980's  . COLONOSCOPY  09/18/2013  . COLPOSCOPY    . FLEXIBLE SIGMOIDOSCOPY  03/29/2012   Procedure: FLEXIBLE SIGMOIDOSCOPY;  Surgeon: Ladene Artist, MD,FACG;  Location: [facility];  Service: Endoscopy;  Laterality: N/A;  . POLYPECTOMY    . TONSILLECTOMY    . TOTAL ABDOMINAL HYSTERECTOMY  2000   Social History   Occupational History  . Occupation: Retired    Fish farm manager: UNEMPLOYED  Tobacco Use  . Smoking status: Former Smoker    Years: 10.00    Types: Cigarettes    Quit date: 09/07/1988    Years since quitting: 31.5  . Smokeless tobacco: Never Used  Vaping Use  . Vaping Use: Never used  Substance and Sexual Activity  . Alcohol use: No    Alcohol/week: 0.0 standard drinks  . Drug use: No  . Sexual activity: Yes    Birth control/protection: Surgical    Comment: INTERCOURSE AGE 71, SEXUAL PARTNERS LESS THAN 5

## 2020-03-26 DIAGNOSIS — M65839 Other synovitis and tenosynovitis, unspecified forearm: Secondary | ICD-10-CM | POA: Insufficient documentation

## 2020-03-26 DIAGNOSIS — M65312 Trigger thumb, left thumb: Secondary | ICD-10-CM | POA: Insufficient documentation

## 2020-03-26 MED ORDER — LIDOCAINE HCL 1 % IJ SOLN
0.3000 mL | INTRAMUSCULAR | Status: AC | PRN
  Administered 2020-03-25: .3 mL

## 2020-03-26 MED ORDER — METHYLPREDNISOLONE ACETATE 40 MG/ML IJ SUSP
40.0000 mg | INTRAMUSCULAR | Status: AC | PRN
  Administered 2020-03-25: 40 mg

## 2020-03-26 MED ORDER — BUPIVACAINE HCL 0.5 % IJ SOLN
0.5000 mL | INTRAMUSCULAR | Status: AC | PRN
  Administered 2020-03-25: .5 mL

## 2020-03-26 MED ORDER — BUPIVACAINE HCL 0.5 % IJ SOLN
0.3300 mL | INTRAMUSCULAR | Status: AC | PRN
  Administered 2020-03-25: .33 mL

## 2020-04-07 ENCOUNTER — Ambulatory Visit (INDEPENDENT_AMBULATORY_CARE_PROVIDER_SITE_OTHER): Admitting: *Deleted

## 2020-04-07 DIAGNOSIS — J309 Allergic rhinitis, unspecified: Secondary | ICD-10-CM

## 2020-04-16 ENCOUNTER — Telehealth: Payer: Self-pay | Admitting: Gastroenterology

## 2020-04-16 ENCOUNTER — Ambulatory Visit (INDEPENDENT_AMBULATORY_CARE_PROVIDER_SITE_OTHER): Admitting: Orthopaedic Surgery

## 2020-04-16 ENCOUNTER — Encounter: Payer: Self-pay | Admitting: Orthopaedic Surgery

## 2020-04-16 VITALS — Ht 63.0 in | Wt 175.0 lb

## 2020-04-16 DIAGNOSIS — M65312 Trigger thumb, left thumb: Secondary | ICD-10-CM | POA: Diagnosis not present

## 2020-04-16 DIAGNOSIS — M65839 Other synovitis and tenosynovitis, unspecified forearm: Secondary | ICD-10-CM | POA: Diagnosis not present

## 2020-04-16 MED ORDER — HUMIRA PEN 40 MG/0.8ML ~~LOC~~ PNKT
40.0000 mg | PEN_INJECTOR | SUBCUTANEOUS | 0 refills | Status: DC
Start: 2020-04-16 — End: 2020-10-23

## 2020-04-16 NOTE — Telephone Encounter (Signed)
Prescription sent to patient's pharmacy and informed patient she is due for follow up. Patient scheduled appt for 06/06/20 at 10:50am.

## 2020-04-16 NOTE — Progress Notes (Signed)
Office Visit Note   Patient: Brianna Santana           Date of Birth: 01-Dec-1955           MRN: [id number] Visit Date: 04/16/2020              Requested by: Cari Caraway, [place],  [place] [address] PCP: Cari Caraway, MD   Assessment & Plan: Visit Diagnoses:  1. Stenosing tenosynovitis of wrist   2. Trigger thumb, left thumb     Plan: Patient like proceed with surgical intervention plan will be first dorsal compartment release left wrist and also trigger thumb release left.  Anesthesia's choice is to be done in outpatient setting.  We discussed being in a splint after the surgery for period of time.  Procedure discussed questions elicited and answered.  Follow-Up Instructions: Preop for left upper extremity outpatient surgery.  Orders:  No orders of the defined types were placed in this encounter.  No orders of the defined types were placed in this encounter.     Procedures: No procedures performed   Clinical Data: No additional findings.   Subjective: Chief Complaint  Patient presents with  . Left Hand - Follow-up    HPI 63 year old female who is right-handed is seen with recurrent problems with left trigger thumb and stenosing tenosynovitis left wrist first dorsal compartment ( DeQuervain's) stenosing tenosynovitis.  She has had persistent symptoms she has been wearing a splint.  Injection gave her slight improvement of thumb still catching and she still having considerable pain in the first dorsal compartment states she has problems and not able to do the dishes has problems making the bed getting dressed pulling her pants up etc.  Patient states she is ready to proceed with surgical release.  Review of Systems positive for GERD history of thyroid mass.  Previous problems with asthma.  Patient has been on rescue inhaler but does not use inhalers chronically.  No chest pain no history of stroke.   Objective: Vital Signs: Ht 5' 3"  (1.6  m)   Wt 175 lb (79.4 kg)   BMI 31.00 kg/m   Physical Exam Constitutional:      Appearance: She is well-developed.  HENT:     Head: Normocephalic.     Right Ear: External ear normal.     Left Ear: External ear normal.  Eyes:     Pupils: Pupils are equal, round, and reactive to light.  Neck:     Thyroid: No thyromegaly.     Trachea: No tracheal deviation.  Cardiovascular:     Rate and Rhythm: Normal rate.  Pulmonary:     Effort: Pulmonary effort is normal.  Abdominal:     Palpations: Abdomen is soft.  Skin:    General: Skin is warm and dry.  Neurological:     Mental Status: She is alert and oriented to person, place, and time.  Psychiatric:        Behavior: Behavior normal.     Ortho Exam patient has positive Finkelstein test on the left negative on the right.  Significant tenderness and prominence of the first dorsal compartment on the left wrist.  Tenderness over the A1 pulley of the thumb with some catching but no true locking of the thumb today.  Specialty Comments:  No specialty comments available.  Imaging: No results found.   PMFS History: Patient Active Problem List   Diagnosis Date Noted  . Trigger thumb, left thumb 03/26/2020  .  Stenosing tenosynovitis of wrist 03/26/2020  . Acute sinusitis 12/20/2016  . Perennial allergic rhinitis 12/20/2016  . Cough 05/10/2016  . Acute left-sided thoracic back pain 04/05/2016  . Routine general medical examination at a health care facility 05/02/2015  . Thyroid mass 01/16/2015  . Drug allergy 07/30/2014  . Crohn's disease ([place]) 03/18/2014  . Osteopenia 09/11/2013  . Hyperthyroidism   . GERD 11/02/2007  . Moderate persistent asthma without complication 73/73/6681   Past Medical History:  Diagnosis Date  . Arthritis   . Asthma   . C. difficile colitis 2011 and 07/2010  . Cervical dysplasia   . Hyperplastic polyp of intestine 09/2007  . Osteopenia 11/2018   T score -2.0 FRAX 3.7% / 0.3%  . Thyroid disease   .  Urinary incontinence     Family History  Problem Relation Age of Onset  . Diabetes Father   . Diabetes Sister        x 2  . Hypertension Sister        x 2  . Diabetes Brother   . Hypertension Brother   . Colon cancer Neg Hx   . Esophageal cancer Neg Hx   . Pancreatic cancer Neg Hx   . Rectal cancer Neg Hx   . Stomach cancer Neg Hx   . Colon polyps Neg Hx     Past Surgical History:  Procedure Laterality Date  . BREAST CYST ASPIRATION Right    2000  . BREAST EXCISIONAL BIOPSY Right    1980's  . COLONOSCOPY  09/18/2013  . COLPOSCOPY    . FLEXIBLE SIGMOIDOSCOPY  03/29/2012   Procedure: FLEXIBLE SIGMOIDOSCOPY;  Surgeon: Ladene Artist, MD,FACG;  Location: [facility];  Service: Endoscopy;  Laterality: N/A;  . POLYPECTOMY    . TONSILLECTOMY    . TOTAL ABDOMINAL HYSTERECTOMY  2000   Social History   Occupational History  . Occupation: Retired    Fish farm manager: UNEMPLOYED  Tobacco Use  . Smoking status: Former Smoker    Years: 10.00    Types: Cigarettes    Quit date: 09/07/1988    Years since quitting: 31.6  . Smokeless tobacco: Never Used  Vaping Use  . Vaping Use: Never used  Substance and Sexual Activity  . Alcohol use: No    Alcohol/week: 0.0 standard drinks  . Drug use: No  . Sexual activity: Yes    Birth control/protection: Surgical    Comment: INTERCOURSE AGE 76, SEXUAL PARTNERS LESS THAN 5

## 2020-04-16 NOTE — Telephone Encounter (Signed)
Pt is requesting a refill on her Humira Pen  Pharmacy: [facility]

## 2020-04-22 ENCOUNTER — Ambulatory Visit: Admitting: Orthopaedic Surgery

## 2020-04-28 ENCOUNTER — Other Ambulatory Visit: Payer: Self-pay | Admitting: Orthopaedic Surgery

## 2020-04-28 DIAGNOSIS — M65312 Trigger thumb, left thumb: Secondary | ICD-10-CM | POA: Diagnosis not present

## 2020-04-28 DIAGNOSIS — M654 Radial styloid tenosynovitis [de Quervain]: Secondary | ICD-10-CM | POA: Diagnosis not present

## 2020-04-28 MED ORDER — HYDROCODONE-ACETAMINOPHEN 5-325 MG PO TABS
1.0000 | ORAL_TABLET | ORAL | 0 refills | Status: DC | PRN
Start: 2020-04-28 — End: 2020-05-29

## 2020-04-28 NOTE — Progress Notes (Signed)
norco post op pain

## 2020-05-01 ENCOUNTER — Ambulatory Visit (INDEPENDENT_AMBULATORY_CARE_PROVIDER_SITE_OTHER)

## 2020-05-01 DIAGNOSIS — J309 Allergic rhinitis, unspecified: Secondary | ICD-10-CM | POA: Diagnosis not present

## 2020-05-07 ENCOUNTER — Encounter: Payer: Self-pay | Admitting: Orthopaedic Surgery

## 2020-05-07 ENCOUNTER — Ambulatory Visit (INDEPENDENT_AMBULATORY_CARE_PROVIDER_SITE_OTHER): Admitting: Orthopaedic Surgery

## 2020-05-07 ENCOUNTER — Other Ambulatory Visit: Payer: Self-pay

## 2020-05-07 VITALS — Ht 63.0 in | Wt 175.0 lb

## 2020-05-07 DIAGNOSIS — M65312 Trigger thumb, left thumb: Secondary | ICD-10-CM

## 2020-05-07 DIAGNOSIS — M65839 Other synovitis and tenosynovitis, unspecified forearm: Secondary | ICD-10-CM

## 2020-05-07 NOTE — Progress Notes (Signed)
Post-Op Visit Note   Patient: Brianna Santana           Date of Birth: 05-Nov-1955           MRN: [id number] Visit Date: 05/07/2020 PCP: Cari Caraway, MD   Assessment & Plan: Post trigger thumb release and first dorsal compartment release. Sutures removed Steri-Strips applied incision looks good she has the splint she can wear thumb gutter splint for few weeks. She is happy the surgical result she has a little bit of numbness along the radial aspect of the thumb but does have sensation. This should improve and I will check her again for likely last visit in 1 month.  Chief Complaint:  Chief Complaint  Patient presents with  . Left Hand - Routine Post Op    04/28/2020 left trigger thumb release, left 1st dorsal compartment release   Visit Diagnoses: No diagnosis found.  Plan: Return in 1 month.  Follow-Up Instructions: Return in about 1 month (around 06/07/2020).   Orders:  No orders of the defined types were placed in this encounter.  No orders of the defined types were placed in this encounter.   Imaging: No results found.  PMFS History: Patient Active Problem List   Diagnosis Date Noted  . Trigger thumb, left thumb 03/26/2020  . Stenosing tenosynovitis of wrist 03/26/2020  . Acute sinusitis 12/20/2016  . Perennial allergic rhinitis 12/20/2016  . Cough 05/10/2016  . Acute left-sided thoracic back pain 04/05/2016  . Routine general medical examination at a health care facility 05/02/2015  . Thyroid mass 01/16/2015  . Drug allergy 07/30/2014  . Crohn's disease ([place]) 03/18/2014  . Osteopenia 09/11/2013  . Hyperthyroidism   . GERD 11/02/2007  . Moderate persistent asthma without complication 43/32/9518   Past Medical History:  Diagnosis Date  . Arthritis   . Asthma   . C. difficile colitis 2011 and 07/2010  . Cervical dysplasia   . Hyperplastic polyp of intestine 09/2007  . Osteopenia 11/2018   T score -2.0 FRAX 3.7% / 0.3%  . Thyroid disease   . Urinary  incontinence     Family History  Problem Relation Age of Onset  . Diabetes Father   . Diabetes Sister        x 2  . Hypertension Sister        x 2  . Diabetes Brother   . Hypertension Brother   . Colon cancer Neg Hx   . Esophageal cancer Neg Hx   . Pancreatic cancer Neg Hx   . Rectal cancer Neg Hx   . Stomach cancer Neg Hx   . Colon polyps Neg Hx     Past Surgical History:  Procedure Laterality Date  . BREAST CYST ASPIRATION Right    2000  . BREAST EXCISIONAL BIOPSY Right    1980's  . COLONOSCOPY  09/18/2013  . COLPOSCOPY    . FLEXIBLE SIGMOIDOSCOPY  03/29/2012   Procedure: FLEXIBLE SIGMOIDOSCOPY;  Surgeon: Ladene Artist, MD,FACG;  Location: [facility];  Service: Endoscopy;  Laterality: N/A;  . POLYPECTOMY    . TONSILLECTOMY    . TOTAL ABDOMINAL HYSTERECTOMY  2000   Social History   Occupational History  . Occupation: Retired    Fish farm manager: UNEMPLOYED  Tobacco Use  . Smoking status: Former Smoker    Years: 10.00    Types: Cigarettes    Quit date: 09/07/1988    Years since quitting: 31.6  . Smokeless tobacco: Never Used  Vaping Use  . Vaping  Use: Never used  Substance and Sexual Activity  . Alcohol use: No    Alcohol/week: 0.0 standard drinks  . Drug use: No  . Sexual activity: Yes    Birth control/protection: Surgical    Comment: INTERCOURSE AGE 64,  SEXUAL PARTNERS LESS THAN 5

## 2020-05-20 ENCOUNTER — Ambulatory Visit (INDEPENDENT_AMBULATORY_CARE_PROVIDER_SITE_OTHER): Admitting: *Deleted

## 2020-05-20 DIAGNOSIS — J309 Allergic rhinitis, unspecified: Secondary | ICD-10-CM | POA: Diagnosis not present

## 2020-05-29 ENCOUNTER — Ambulatory Visit (INDEPENDENT_AMBULATORY_CARE_PROVIDER_SITE_OTHER): Admitting: Allergy & Immunology

## 2020-05-29 ENCOUNTER — Other Ambulatory Visit: Payer: Self-pay

## 2020-05-29 ENCOUNTER — Encounter: Payer: Self-pay | Admitting: Allergy & Immunology

## 2020-05-29 VITALS — BP 112/68 | HR 92 | Temp 97.7°F | Resp 16

## 2020-05-29 DIAGNOSIS — J4540 Moderate persistent asthma, uncomplicated: Secondary | ICD-10-CM

## 2020-05-29 DIAGNOSIS — J3089 Other allergic rhinitis: Secondary | ICD-10-CM | POA: Diagnosis not present

## 2020-05-29 NOTE — Progress Notes (Signed)
FOLLOW UP  Date of Service/Encounter:  05/29/20   Assessment:   Moderate persistent asthma without complication  Perennial allergic rhinitis(cockroach, molds, dust mites)- on allergen immunotherapy with maintenance reached in September 2019  Vaccinated to [place] - did receive a booster  Plan/Recommendations:   1. Moderate persistent asthma, uncomplicated - Lung testing looks good today. - We are not going to make any medication changes at that time.  - I would recommend doing the Symbicort TWO PUFFS TWICE DAILY for 1-2 weeks to get through this current coughing episode.  - Daily controller medication(s): Singulair 67m daily + Symbicort 80/4.5 two puffs once daily - Rescue medications: ProAir 4 puffs every 4-6 hours as needed  - Asthma control goals:  * Full participation in all desired activities (may need albuterol before activity) * Albuterol use two time or less a week on average (not counting use with activity) * Cough interfering with sleep two time or less a month * Oral steroids no more than once a year * No hospitalizations  2. Chronic perennial rhinitis (cockroach, dust mite, molds) - Continue with allergy shots at the same schedule.  - Continue with all of your nose sprays on an as-needed basis. - Anticipate five years of allergen immunotherapy (September 2024).   3. Return in about 6 months (around 11/27/2020).  Subjective:   DWeslee Prestageis a 64y.o. female presenting today for follow up of  Chief Complaint  Patient presents with  . Asthma    DLucinda Spellshas a history of the following: Patient Active Problem List   Diagnosis Date Noted  . Trigger thumb, left thumb 03/26/2020  . Stenosing tenosynovitis of wrist 03/26/2020  . Acute sinusitis 12/20/2016  . Perennial allergic rhinitis 12/20/2016  . Cough 05/10/2016  . Acute left-sided thoracic back pain 04/05/2016  . Routine general medical examination at a health care facility  05/02/2015  . Thyroid mass 01/16/2015  . Drug allergy 07/30/2014  . [place]'s [place] 03/18/2014  . Osteopenia 09/11/2013  . Hyperthyroidism   . GERD 11/02/2007  . Moderate persistent asthma without complication 040/12/6759   History obtained from: chart review and patient.  DMurandais a 64y.o. female presenting for a follow up visit. She was last seen in June 2021. At thate time, her lung testing looked great.  We continue with Singulair as well as Symbicort 80/4.5 mcg 2 puffs once daily.  For her rhinitis, would continue with allergy shots as well as the as needed use of her nasal sprays.  Since last visit, she has mostly done well. She had surgery on November 29th. She had a knot on her left hand and her trigger thumb. She is doing better postop.   Asthma/Respiratory Symptom History: She started having some nighttime coughing after Christmas. [facility] She thinks that it is something in the air. She remains on the Symbicort two puffs twice daily as well as albuterol as needed.   Allergic Rhinitis Symptom History: Her shots are going well. She is using her medications, but not all of the time. She has not needed antibiotics at all. She reports that the shots are working well to control her symptoms.   She is fully boosted. She is not sure whether she is going to be going to church during the Omicron variant.   Otherwise, there have been no changes to her past medical history, surgical history, family history, or social history.  Review of Systems  Constitutional: Negative.  Negative for chills, fever, malaise/fatigue and weight loss.  HENT: Positive for congestion. Negative for ear discharge, ear pain and sinus pain.   Eyes: Negative for pain, discharge and redness.  Respiratory: Positive for cough. Negative for sputum production, shortness of breath and wheezing.   Cardiovascular: Negative.  Negative for chest pain and palpitations.   Gastrointestinal: Negative for abdominal pain, constipation, diarrhea, heartburn, nausea and vomiting.  Skin: Negative.  Negative for itching and rash.  Neurological: Negative for dizziness and headaches.  Endo/Heme/Allergies: Positive for environmental allergies. Does not bruise/bleed easily.       Objective:   Blood pressure 112/68, pulse 92, temperature 97.7 F (36.5 C), temperature source Temporal, resp. rate 16, SpO2 96 %. There is no height or weight on file to calculate BMI.   Physical Exam:  Physical Exam Constitutional:      Appearance: She is well-developed.     Comments: Bubbly and adorable.  HENT:     Head: Normocephalic and atraumatic.     Right Ear: Tympanic membrane, ear canal and external ear normal.     Left Ear: Tympanic membrane, ear canal and external ear normal.     Nose: No nasal deformity, septal deviation, mucosal edema, rhinorrhea or epistaxis.     Right Turbinates: Swollen. Not enlarged or pale.     Left Turbinates: Swollen. Not enlarged or pale.     Right Sinus: No maxillary sinus tenderness or frontal sinus tenderness.     Left Sinus: No maxillary sinus tenderness or frontal sinus tenderness.     Mouth/Throat:     Mouth: Oropharynx is clear and moist. Mucous membranes are not pale and not dry.     Pharynx: Uvula midline.  Eyes:     General:        Right eye: No discharge.        Left eye: No discharge.     Extraocular Movements: EOM normal.     Conjunctiva/sclera: Conjunctivae normal.     Right eye: Right conjunctiva is not injected. No chemosis.    Left eye: Left conjunctiva is not injected. No chemosis.    Pupils: Pupils are equal, round, and reactive to light.  Cardiovascular:     Rate and Rhythm: Normal rate and regular rhythm.     Heart sounds: Normal heart sounds.  Pulmonary:     Effort: Pulmonary effort is normal. No tachypnea, accessory muscle usage or respiratory distress.     Breath sounds: Normal breath sounds. No wheezing,  rhonchi or rales.     Comments: No wheezing. Moving air well in all lung fields. No increased work of breathing noted.  Chest:     Chest wall: No tenderness.  Lymphadenopathy:     Cervical: No cervical adenopathy.  Skin:    Coloration: Skin is not pale.     Findings: No abrasion, erythema, petechiae or rash. Rash is not papular, urticarial or vesicular.     Comments: No eczematous or urticarial lesions noted.   Neurological:     Mental Status: She is alert.  Psychiatric:        Mood and Affect: Mood and affect normal.      Diagnostic studies:    Spirometry: results normal (FEV1: 2.16/114%, FVC: 2.96/121%, FEV1/FVC: 73%).    Spirometry consistent with normal pattern.   Allergy Studies: none              Salvatore Marvel, MD  [facility]

## 2020-05-29 NOTE — Patient Instructions (Addendum)
1. Moderate persistent asthma, uncomplicated - Lung testing looks good today. - We are not going to make any medication changes at that time.  - I would recommend doing the Symbicort TWO PUFFS TWICE DAILY for 1-2 weeks to get through this current coughing episode.  - Daily controller medication(s): Singulair 46m daily + Symbicort 80/4.5 two puffs once daily - Rescue medications: ProAir 4 puffs every 4-6 hours as needed  - Asthma control goals:  * Full participation in all desired activities (may need albuterol before activity) * Albuterol use two time or less a week on average (not counting use with activity) * Cough interfering with sleep two time or less a month * Oral steroids no more than once a year * No hospitalizations  2. Chronic perennial rhinitis (cockroach, dust mite, molds) - Continue with allergy shots at the same schedule.  - Continue with all of your nose sprays on an as-needed basis. - Anticipate five years of allergen immunotherapy (September 2024).   3. Return in about 6 months (around 11/27/2020).   Please inform u[place]of any Emergency Department visits, hospitalizations, or changes in symptoms. Call u[place]abefore going to the ED for breathing or allergy symptoms since we might be able to fit you in for a sick visit. Feel free to contact u[place]oreaanytime with any questions, problems, or concerns.  It was a pleasure to see you again today!  GET [facility] Hospitalfor your wound!   Good luck with the rest of the book writing!   Websites that have reliable patient information: 1. American Academy of Asthma, Allergy, and Immunology: www.aaaai.org 2. Food Allergy Research and Education (FARE): foodallergy.org 3. Mothers of Asthmatics: http://www.asthmacommunitynetwork.org 4. American College of Allergy, Asthma, and Immunology: www.acaai.org   COVID-19 Vaccine Information can be found at: [email or web address] questions related to  vaccine distribution or appointments, please email [email or web address] or call [phone]     "Like" [facility] Facebook and Instagram for our latest updates!       Make sure you are registered to vote! If you have moved or changed any of your contact information, you will need to get this updated before voting!  In some cases, you MAY be able to register to vote online: [email or web address]

## 2020-06-06 ENCOUNTER — Encounter: Payer: Self-pay | Admitting: Gastroenterology

## 2020-06-06 ENCOUNTER — Ambulatory Visit: Admitting: Orthopaedic Surgery

## 2020-06-06 ENCOUNTER — Ambulatory Visit: Admitting: Gastroenterology

## 2020-06-06 VITALS — BP 126/72 | HR 76 | Ht 63.0 in | Wt 185.2 lb

## 2020-06-06 DIAGNOSIS — K5010 Crohn's disease of large intestine without complications: Secondary | ICD-10-CM | POA: Diagnosis not present

## 2020-06-06 NOTE — Patient Instructions (Signed)
Please have your pharmacy contact our office if you need refills on your medications.   We will obtain your lab results from your primary care physician.   Thank you for choosing me and [facility].  Pricilla Riffle. Dagoberto Ligas., MD., [facility] Regal

## 2020-06-06 NOTE — Progress Notes (Signed)
    History of Present Illness: This is a 65 year old female with Crohn's colitis.  She is maintained on Humira and is completely asymptomatic.  On her colonoscopy in June 2020 she had left colon postinflammatory changes and mild active colitis in the right colon.   She had blood work performed by her PCP in September and we will request those results.  Current Medications, Allergies, Past Medical History, Past Surgical History, Family History and Social History were reviewed in [employer] record.   Physical Exam: General: Well developed, well nourished, no acute distress Head: Normocephalic and atraumatic Eyes:  sclerae anicteric, EOMI Ears: Normal auditory acuity Mouth: Not examined, mask on during Covid-19 pandemic Lungs: Clear throughout to auscultation Heart: Regular rate and rhythm; no murmurs, rubs or bruits Abdomen: Soft, non tender and non distended. No masses, hepatosplenomegaly or hernias noted. Normal Bowel sounds Rectal: Not done Musculoskeletal: Symmetrical with no gross deformities  Pulses:  Normal pulses noted Extremities: No clubbing, cyanosis, edema or deformities noted Neurological: Alert oriented x 4, grossly nonfocal Psychological:  Alert and cooperative. Normal mood and affect   Assessment and Recommendations:  1. Crohn's colitis.  Asymptomatic and stable on Humira.  Mild right colon activity by biopsy on colonoscopy in June 2020.  Continue Humira 40 mg subcu q. 14 days.  REV in 6 months.  2.  GERD.  Follow antireflux measures.  Continue omeprazole 40 mg daily and famotidine 20 mg twice daily as needed.

## 2020-06-09 ENCOUNTER — Ambulatory Visit (INDEPENDENT_AMBULATORY_CARE_PROVIDER_SITE_OTHER)

## 2020-06-09 DIAGNOSIS — J309 Allergic rhinitis, unspecified: Secondary | ICD-10-CM | POA: Diagnosis not present

## 2020-06-09 DIAGNOSIS — J4540 Moderate persistent asthma, uncomplicated: Secondary | ICD-10-CM

## 2020-06-26 ENCOUNTER — Telehealth: Payer: Self-pay | Admitting: Gastroenterology

## 2020-06-26 MED ORDER — OMEPRAZOLE 40 MG PO CPDR
DELAYED_RELEASE_CAPSULE | ORAL | 3 refills | Status: DC
Start: 2020-06-26 — End: 2020-09-29

## 2020-06-26 NOTE — Telephone Encounter (Signed)
Prescription sent to patient's pharmacy and patient notified. Patient verbalized understanding.

## 2020-06-30 ENCOUNTER — Ambulatory Visit (INDEPENDENT_AMBULATORY_CARE_PROVIDER_SITE_OTHER)

## 2020-06-30 DIAGNOSIS — J309 Allergic rhinitis, unspecified: Secondary | ICD-10-CM | POA: Diagnosis not present

## 2020-07-09 DIAGNOSIS — J3089 Other allergic rhinitis: Secondary | ICD-10-CM

## 2020-07-09 NOTE — Progress Notes (Signed)
VIAL EXP 07-09-21

## 2020-07-11 ENCOUNTER — Telehealth: Payer: Self-pay | Admitting: Gastroenterology

## 2020-07-11 NOTE — Telephone Encounter (Signed)
I told Brianna Santana she can get the OTC prilosec 69m and take 2 pills to equal the 434mPrilosec sent in. I told her to call the pharmacy and see if they can track her rx down as she said its been 13 days.

## 2020-07-16 ENCOUNTER — Telehealth: Payer: Self-pay | Admitting: Allergy & Immunology

## 2020-07-16 MED ORDER — MONTELUKAST SODIUM 10 MG PO TABS
10.0000 mg | ORAL_TABLET | Freq: Every day | ORAL | 2 refills | Status: DC
Start: 2020-07-16 — End: 2020-09-29

## 2020-07-16 NOTE — Telephone Encounter (Signed)
Patient called to have [name] refill her montelukast thur champusva. [phone].. [phone].

## 2020-07-16 NOTE — Telephone Encounter (Signed)
Called and spoke to patient and informed her that the medication refill has been sent over the [place].

## 2020-07-21 ENCOUNTER — Ambulatory Visit (INDEPENDENT_AMBULATORY_CARE_PROVIDER_SITE_OTHER)

## 2020-07-21 DIAGNOSIS — J309 Allergic rhinitis, unspecified: Secondary | ICD-10-CM | POA: Diagnosis not present

## 2020-08-11 ENCOUNTER — Ambulatory Visit (INDEPENDENT_AMBULATORY_CARE_PROVIDER_SITE_OTHER)

## 2020-08-11 DIAGNOSIS — J309 Allergic rhinitis, unspecified: Secondary | ICD-10-CM

## 2020-08-18 ENCOUNTER — Ambulatory Visit (INDEPENDENT_AMBULATORY_CARE_PROVIDER_SITE_OTHER): Admitting: *Deleted

## 2020-08-18 DIAGNOSIS — J309 Allergic rhinitis, unspecified: Secondary | ICD-10-CM | POA: Diagnosis not present

## 2020-08-25 ENCOUNTER — Ambulatory Visit (INDEPENDENT_AMBULATORY_CARE_PROVIDER_SITE_OTHER)

## 2020-08-25 DIAGNOSIS — J309 Allergic rhinitis, unspecified: Secondary | ICD-10-CM | POA: Diagnosis not present

## 2020-09-01 ENCOUNTER — Ambulatory Visit (INDEPENDENT_AMBULATORY_CARE_PROVIDER_SITE_OTHER): Admitting: *Deleted

## 2020-09-01 DIAGNOSIS — J309 Allergic rhinitis, unspecified: Secondary | ICD-10-CM | POA: Diagnosis not present

## 2020-09-08 ENCOUNTER — Ambulatory Visit (INDEPENDENT_AMBULATORY_CARE_PROVIDER_SITE_OTHER)

## 2020-09-08 DIAGNOSIS — J309 Allergic rhinitis, unspecified: Secondary | ICD-10-CM

## 2020-09-29 ENCOUNTER — Other Ambulatory Visit: Payer: Self-pay | Admitting: *Deleted

## 2020-09-29 ENCOUNTER — Telehealth: Payer: Self-pay | Admitting: Gastroenterology

## 2020-09-29 MED ORDER — OMEPRAZOLE 40 MG PO CPDR
DELAYED_RELEASE_CAPSULE | ORAL | 3 refills | Status: DC
Start: 2020-09-29 — End: 2021-07-01

## 2020-09-29 MED ORDER — MONTELUKAST SODIUM 10 MG PO TABS
10.0000 mg | ORAL_TABLET | Freq: Every day | ORAL | 2 refills | Status: DC
Start: 2020-09-29 — End: 2021-06-11

## 2020-09-29 NOTE — Telephone Encounter (Signed)
Inbound call from patient. Need medication refill for omeprazole sent to [place] [facility]:[phone]

## 2020-09-29 NOTE — Telephone Encounter (Signed)
Prescription sent to patient's pharmacy.

## 2020-10-06 ENCOUNTER — Ambulatory Visit (INDEPENDENT_AMBULATORY_CARE_PROVIDER_SITE_OTHER): Payer: Self-pay | Admitting: *Deleted

## 2020-10-06 DIAGNOSIS — J309 Allergic rhinitis, unspecified: Secondary | ICD-10-CM

## 2020-10-23 ENCOUNTER — Telehealth: Payer: Self-pay | Admitting: Gastroenterology

## 2020-10-23 MED ORDER — HUMIRA PEN 40 MG/0.8ML ~~LOC~~ PNKT
40.0000 mg | PEN_INJECTOR | SUBCUTANEOUS | 2 refills | Status: DC
Start: 2020-10-23 — End: 2020-11-24

## 2020-10-23 NOTE — Telephone Encounter (Signed)
Inbound call from patient requesting additional refills for Humira please.

## 2020-10-23 NOTE — Telephone Encounter (Signed)
Patient notified refill sent

## 2020-11-03 ENCOUNTER — Other Ambulatory Visit: Payer: Self-pay | Admitting: Gastroenterology

## 2020-11-03 NOTE — Telephone Encounter (Signed)
Please advise pharmacy

## 2020-11-05 ENCOUNTER — Other Ambulatory Visit: Payer: Self-pay | Admitting: Gastroenterology

## 2020-11-06 ENCOUNTER — Ambulatory Visit (INDEPENDENT_AMBULATORY_CARE_PROVIDER_SITE_OTHER): Payer: Self-pay | Admitting: *Deleted

## 2020-11-06 DIAGNOSIS — J309 Allergic rhinitis, unspecified: Secondary | ICD-10-CM

## 2020-11-12 ENCOUNTER — Other Ambulatory Visit: Payer: Self-pay | Admitting: Nurse Practitioner

## 2020-11-13 ENCOUNTER — Other Ambulatory Visit: Payer: Self-pay

## 2020-11-13 ENCOUNTER — Ambulatory Visit (INDEPENDENT_AMBULATORY_CARE_PROVIDER_SITE_OTHER): Payer: Medicare Other | Admitting: Nurse Practitioner

## 2020-11-13 ENCOUNTER — Telehealth: Payer: Self-pay | Admitting: *Deleted

## 2020-11-13 ENCOUNTER — Encounter: Payer: Self-pay | Admitting: Nurse Practitioner

## 2020-11-13 ENCOUNTER — Other Ambulatory Visit: Payer: Self-pay | Admitting: Nurse Practitioner

## 2020-11-13 VITALS — BP 116/64 | HR 88 | Resp 16 | Wt 182.0 lb

## 2020-11-13 DIAGNOSIS — L299 Pruritus, unspecified: Secondary | ICD-10-CM

## 2020-11-13 DIAGNOSIS — Z1231 Encounter for screening mammogram for malignant neoplasm of breast: Secondary | ICD-10-CM

## 2020-11-13 NOTE — Progress Notes (Signed)
GYNECOLOGY  VISIT  CC:   Itching of Left breast, skin lesion x 3 days  HPI: 65 y.o. G24P3003 Married Brianna Santana here for breast itching.  In past has had skin eruptions that cause intense itching. She had this on her right breast when she was under care of Dr. Cherylann Banas. He biopsied (09/15/2000) and was reported as perivascular dermatitis. She continues to get these skin sores that pop up occasionally. She went to a dermatologist who gave her a cream (fluocinonide).   She has noticed itching for 3 days but just noticed the spot on her breast last night. Unsure it is the same thing she has been experiencing, a bug bite or something else. She has applied fluocinonide to the area that is itching last night and it did seem to help a little.  She tried to schedule her mammogram (last done 11/22/2019) but when they asked her if she had any symptoms and she said "itching", she was told she had to have an appointment to get a diagnostic mammogram order.  Has appointment scheduled for annual exam on 12/03/2020.    GYNECOLOGIC HISTORY: No LMP recorded. Patient has had a hysterectomy. Contraception: hysterectomy Menopausal hormone therapy: none  Patient Active Problem List   Diagnosis Date Noted   Trigger thumb, left thumb 03/26/2020   Stenosing tenosynovitis of wrist 03/26/2020   Acute sinusitis 12/20/2016   Perennial allergic rhinitis 12/20/2016   Cough 05/10/2016   Acute left-sided thoracic back pain 04/05/2016   Routine general medical examination at a health care facility 05/02/2015   Thyroid mass 01/16/2015   Drug allergy 07/30/2014   Crohn's disease ([place]) 03/18/2014   Osteopenia 09/11/2013   Hyperthyroidism    GERD 11/02/2007   Moderate persistent asthma without complication 36/62/9476    Past Medical History:  Diagnosis Date   Arthritis    Asthma    C. difficile colitis 2011 and 07/2010   Cervical dysplasia    Crohn's disease ([place])    Hyperplastic polyp of  intestine 09/2007   Osteopenia 11/2018   T score -2.0 FRAX 3.7% / 0.3%   Thyroid disease    Urinary incontinence     Past Surgical History:  Procedure Laterality Date   BREAST CYST ASPIRATION Right    2000   BREAST EXCISIONAL BIOPSY Right    1980's   COLONOSCOPY  09/18/2013   COLPOSCOPY     FLEXIBLE SIGMOIDOSCOPY  03/29/2012   Procedure: FLEXIBLE SIGMOIDOSCOPY;  Surgeon: Ladene Artist, MD,FACG;  Location: [facility];  Service: Endoscopy;  Laterality: N/A;   GANGLION CYST EXCISION Left    POLYPECTOMY     TONSILLECTOMY     TOTAL ABDOMINAL HYSTERECTOMY  2000   TRIGGER FINGER RELEASE     WRIST SURGERY Left 2021    MEDS:   Current Outpatient Medications on File Prior to Visit  Medication Sig Dispense Refill   Adalimumab (HUMIRA PEN) 40 MG/0.8ML PNKT Inject 40 mg into the skin every 14 (fourteen) days. Citrate free 6 each 2   albuterol (PROAIR HFA) 108 (90 Base) MCG/ACT inhaler INHALE 4 PUFFS INTO THE LUNGS EVERY 4 HOURS AS NEEDED FOR SHORTNESS OF BREATH OR WHEEZING 18 g 1   azelastine (ASTELIN) 0.1 % nasal spray Spray 1-2 sprays per nostril twice daily (Patient taking differently: Place 1-2 sprays into both nostrils 2 (two) times daily.) 30 mL 5   BIOTIN PO Take by mouth.     budesonide-formoterol (SYMBICORT) 80-4.5 MCG/ACT inhaler INHALE 2 PUFFS INTO THE  LUNGS TWICE A DAY 10.2 g 4   cetirizine (ZYRTEC) 10 MG tablet Take 1 tablet (10 mg total) by mouth daily. 90 tablet 2   diphenhydrAMINE (BENADRYL) 25 MG tablet Take 1 tablet (25 mg total) by mouth every 6 (six) hours as needed. 20 tablet 0   EPINEPHrine (EPIPEN 2-PAK) 0.3 mg/0.3 mL IJ SOAJ injection Inject 0.3 mLs (0.3 mg total) into the muscle as needed for anaphylaxis. 1 each 0   fluocinonide ointment (LIDEX) 0.05 % Apply twice daily to the affected areas     montelukast (SINGULAIR) 10 MG tablet Take 1 tablet (10 mg total) by mouth at bedtime. 90 tablet 2   omeprazole (PRILOSEC) 40 MG capsule TAKE 1 CAPSULE BY MOUTH DAILY FOR  HEARTBURN 90 capsule 3   potassium chloride (K-DUR) 10 MEQ tablet Take 1 tablet (10 mEq total) by mouth daily. 90 tablet 1   No current facility-administered medications on file prior to visit.    ALLERGIES: Amoxicillin, Levofloxacin, Seasonal ic [cholestatin], and Tramadol  Family History  Problem Relation Age of Onset   Diabetes Father    Diabetes Sister        x 2   Hypertension Sister        x 2   Diabetes Brother    Hypertension Brother    Colon cancer Neg Hx    Esophageal cancer Neg Hx    Pancreatic cancer Neg Hx    Rectal cancer Neg Hx    Stomach cancer Neg Hx    Colon polyps Neg Hx      Review of Systems  Constitutional: Negative.   HENT: Negative.    Eyes: Negative.   Respiratory: Negative.    Cardiovascular: Negative.   Gastrointestinal: Negative.   Endocrine: Negative.   Genitourinary: Negative.   Musculoskeletal: Negative.   Skin:        Breast itching  Allergic/Immunologic: Negative.   Neurological: Negative.   Hematological: Negative.   Psychiatric/Behavioral: Negative.     PHYSICAL EXAMINATION:    BP 116/64   Pulse 88   Resp 16   Wt 182 lb (82.6 kg)   BMI 32.24 kg/m     General appearance: alert, cooperative, no acute distress Breast inspection in 3 positions. See below. No retractions or nipple deviations No palpable mass with breast exam, although breast are dense bilaterally.  Physical Exam Chest:      Assessment: Pruritic dermatitis   Plan: Continue using fluocinonide as prescribed previously by dermatologist, do not use longer than 2 weeks Advised that since her symptoms have only been present a few days, I recommend giving more time to allow healing. It is possible that it is an insect bite and will resolve spontaneously. May try OTC oral antihistamine such as zyrtec in addition to the cream to help with itching. Keep appointment for annual, if lesion still present after treating, will recommend biopsy.   Advised that I do  not palpate any breast abnormality with breast exam and screening mammogram should be adequate.

## 2020-11-13 NOTE — Telephone Encounter (Signed)
Patient scheduled at the breast center on 01/07/21 @ 1:30pm for screening mammogram. Left message for patient to call.

## 2020-11-13 NOTE — Telephone Encounter (Signed)
-----   Message from Karma Ganja, NP sent at 11/13/2020  1:27 PM EDT ----- This patient tried to schedule a screening mammogram and could not because she has itching on her breast. She was told she needed an evaluation first .  I did her breast exam and feel like the itching is related to a different issue and I feel it is appropriate to do a screening mammogram.  The patient asked if we could schedule it for her since she was not able. Can you do this for her. She had her last screening 11/22/2019. Thanks, [employer]

## 2020-11-13 NOTE — Patient Instructions (Signed)
Pruritus Pruritus is an itchy feeling on the skin. One of the most common causes is dry skin, but many different things can cause itching. Most cases of itching do notrequire medical attention. Sometimes itchy skin can turn into a rash. Follow these instructions at home: Skin care  Apply moisturizing lotion to your skin as needed. Lotion that contains petroleum jelly is best. Take medicines or apply medicated creams only as told by your health care provider. This may include: Corticosteroid cream. Anti-itch lotions. Oral antihistamines. Apply a cool, wet cloth (cool compress) to the affected areas. Take baths with one of the following: Epsom salts. You can get these at your local pharmacy or grocery store. Follow the instructions on the packaging. Baking soda. Pour a small amount into the bath as told by your health care provider. Colloidal oatmeal. You can get this at your local pharmacy or grocery store. Follow the instructions on the packaging. Apply baking soda paste to your skin. To make the paste, stir water into a small amount of baking soda until it reaches a paste-like consistency. Do not scratch your skin. Do not take hot showers or baths, which can make itching worse. A cool shower may help with itching as long as you apply moisturizing lotion after the shower. Do not use scented soaps, detergents, perfumes, and cosmetic products. Instead, use gentle, unscented versions of these items.  General instructions Avoid wearing tight clothes. Keep a journal to help find out what is causing your itching. Write down: What you eat and drink. What cosmetic products you use. What soaps or detergents you use. What you wear, including jewelry. Use a humidifier. This keeps the air moist, which helps to prevent dry skin. Be aware of any changes in your itchiness. Contact a health care provider if: The itching does not go away after several days. You are unusually thirsty or urinating more  than normal. Your skin tingles or feels numb. Your skin or the white parts of your eyes turn yellow (jaundice). You feel weak. You have any of the following: Night sweats. Tiredness (fatigue). Weight loss. Abdominal pain. Summary Pruritus is an itchy feeling on the skin. One of the most common causes is dry skin, but many different conditions and factors can cause itching. Apply moisturizing lotion to your skin as needed. Lotion that contains petroleum jelly is best. Take medicines or apply medicated creams only as told by your health care provider. Do not take hot showers or baths. Do not use scented soaps, detergents, perfumes, or cosmetic products. This information is not intended to replace advice given to you by your health care provider. Make sure you discuss any questions you have with your healthcare provider. Document Revised: 05/31/2017 Document Reviewed: 05/31/2017 Elsevier Patient Education  2022 [employer].

## 2020-11-19 NOTE — Telephone Encounter (Signed)
Patient informed. 

## 2020-11-24 ENCOUNTER — Telehealth: Payer: Self-pay | Admitting: Gastroenterology

## 2020-11-24 ENCOUNTER — Encounter: Payer: Self-pay | Admitting: Nurse Practitioner

## 2020-11-24 MED ORDER — HUMIRA PEN 40 MG/0.8ML ~~LOC~~ PNKT
40.0000 mg | PEN_INJECTOR | SUBCUTANEOUS | 1 refills | Status: DC
Start: 2020-11-24 — End: 2021-06-04

## 2020-11-24 NOTE — Telephone Encounter (Signed)
Prescription sent to patient's pharmacy. Patient notified she is due for follow up visit. Scheduled appt for 01/01/21 at 9:10am

## 2020-11-24 NOTE — Telephone Encounter (Signed)
Inbound call from patient. Needs medication refill for humira pen to [facility].

## 2020-12-02 ENCOUNTER — Ambulatory Visit (INDEPENDENT_AMBULATORY_CARE_PROVIDER_SITE_OTHER): Payer: Medicare Other | Admitting: *Deleted

## 2020-12-02 DIAGNOSIS — J309 Allergic rhinitis, unspecified: Secondary | ICD-10-CM

## 2020-12-03 ENCOUNTER — Ambulatory Visit (INDEPENDENT_AMBULATORY_CARE_PROVIDER_SITE_OTHER): Payer: Medicare Other | Admitting: Nurse Practitioner

## 2020-12-03 ENCOUNTER — Other Ambulatory Visit: Payer: Self-pay

## 2020-12-03 ENCOUNTER — Encounter: Payer: Self-pay | Admitting: Nurse Practitioner

## 2020-12-03 VITALS — BP 124/80 | Ht 64.0 in | Wt 184.0 lb

## 2020-12-03 DIAGNOSIS — Z780 Asymptomatic menopausal state: Secondary | ICD-10-CM

## 2020-12-03 DIAGNOSIS — M8589 Other specified disorders of bone density and structure, multiple sites: Secondary | ICD-10-CM

## 2020-12-03 DIAGNOSIS — Z01419 Encounter for gynecological examination (general) (routine) without abnormal findings: Secondary | ICD-10-CM | POA: Diagnosis not present

## 2020-12-03 NOTE — Progress Notes (Signed)
   Elizaveta Mattice 29-Oct-1955 [id number]   History:  65 y.o. MBF G3 P3 presents for breast and pelvic exam without GYN complaints. Postmenopausal - no HRT. S/P 2002 TVH for fibroids. Normal pap and mammogram history. Crohn's and GERD followed by GI. Primary care manages hypothyroidism, asthma.   Gynecologic History No LMP recorded. Patient has had a hysterectomy.   Contraception: status post hysterectomy Last Pap: no longer screening per guidelines and pt request Last mammogram: 11/21/2019 Results were: Normal Last colonoscopy: 11/07/2018. Results were: inflammation consistent with Crohn's disease, repeat in 5 years Last Dexa: 12/14/2018. Results were: T-score -1.5 FRAX 3.7% / 0.3%  Past medical history, past surgical history, family history and social history were all reviewed and documented in the EPIC chart. Married. Daughter in [employer] in [place]. 10 grandchildren.   ROS:  A ROS was performed and pertinent positives and negatives are included.  Exam:  Vitals:   12/03/20 1051  BP: 124/80  Weight: 184 lb (83.5 kg)  Height: 5' 4"  (1.626 m)   Body mass index is 31.58 kg/m.  General appearance:  Normal Thyroid:  Symmetrical, normal in size, without palpable masses or nodularity. Respiratory  Auscultation:  Clear without wheezing or rhonchi Cardiovascular  Auscultation:  Regular rate, without rubs, murmurs or gallops  Edema/varicosities:  Not grossly evident Abdominal  Soft,nontender, without masses, guarding or rebound.  Liver/spleen:  No organomegaly noted  Hernia:  None appreciated  Skin  Inspection:  Grossly normal   Breasts: Examined lying and sitting.   Right: Without masses, retractions, discharge or axillary adenopathy.   Left: Without masses, retractions, discharge or axillary adenopathy. Gentitourinary   Inguinal/mons:  Normal without inguinal adenopathy  External genitalia:  Normal  BUS/Urethra/Skene's glands:  Normal  Vagina:  Normal  Cervix:   Absent  Uterus:  Absent  Anus and perineum: Normal  Digital rectal exam: Normal sphincter tone without palpated masses or tenderness  Assessment/Plan:  65 y.o. G3 P3 for annual exam.   Well female exam with routine gynecological exam - Education provided on SBEs, importance of preventative screenings, current guidelines, high calcium diet, regular exercise, and multivitamin daily. Labs with PCP.  Osteopenia of multiple sites - Plan: DG Bone Density. Dexa 12/14/2018 T-score -1.5 FRAX 3.7% / 0.3%. She is due for repeat this month and will schedule this soon. Continue calcium and vitamin D supplement and regular exercise.   Postmenopausal - Plan: DG Bone Density. No HRT.   Screening for cervical cancer - Normal Pap history. No longer screening per guidelines.   Screening for breast cancer - Normal mammogram history.  Continue annual screenings. She is scheduled for 01/07/2021.  Normal breast exam today.  Screening for colon cancer - 2020 colonoscopy. Will repeat at GI's recommended interval.   Follow up in 1 year for annual      [place], 11:16 AM 12/03/2020

## 2020-12-09 ENCOUNTER — Other Ambulatory Visit: Payer: Self-pay

## 2020-12-09 ENCOUNTER — Ambulatory Visit (INDEPENDENT_AMBULATORY_CARE_PROVIDER_SITE_OTHER): Payer: Medicare Other | Admitting: Allergy & Immunology

## 2020-12-09 ENCOUNTER — Encounter: Payer: Self-pay | Admitting: Allergy & Immunology

## 2020-12-09 VITALS — BP 122/76 | HR 73 | Temp 98.2°F | Resp 17

## 2020-12-09 DIAGNOSIS — J4540 Moderate persistent asthma, uncomplicated: Secondary | ICD-10-CM

## 2020-12-09 DIAGNOSIS — J3089 Other allergic rhinitis: Secondary | ICD-10-CM | POA: Diagnosis not present

## 2020-12-09 MED ORDER — BUDESONIDE-FORMOTEROL FUMARATE 80-4.5 MCG/ACT IN AERO: INHALATION_SPRAY | RESPIRATORY_TRACT | 4 refills | Status: DC

## 2020-12-09 MED ORDER — ALBUTEROL SULFATE HFA 108 (90 BASE) MCG/ACT IN AERS: INHALATION_SPRAY | RESPIRATORY_TRACT | 1 refills | Status: DC

## 2020-12-09 NOTE — Progress Notes (Signed)
FOLLOW UP  Date of Service/Encounter:  12/09/20   Assessment:   Moderate persistent asthma without complication - doing well on an alternative Symbicort regimen    Perennial allergic rhinitis  (cockroach, molds, dust mites) - on allergen immunotherapy with maintenance reached in September 2019   Vaccinated to [place] - with two boosters  Plan/Recommendations:   1. Moderate persistent asthma, uncomplicated - Lung testing looks AMAZING today with over 100% of expected.  - We are not going to make any medication changes at that time.  - Daily controller medication(s): Singulair 41m daily + Symbicort 80/4.5 four puffs once daily - Rescue medications: ProAir 4 puffs every 4-6 hours as needed  - Asthma control goals:  * Full participation in all desired activities (may need albuterol before activity) * Albuterol use two time or less a week on average (not counting use with activity) * Cough interfering with sleep two time or less a month * Oral steroids no more than once a year * No hospitalizations  2. Chronic perennial rhinitis (cockroach, dust mite, molds) - Continue with allergy shots at the same schedule.  - Continue with all of your nose sprays on an as-needed basis. - Anticipate five years of allergen immunotherapy (September 2024).   3. Return in about 6 months (around 06/11/2021).   Subjective:   DBrittish Bolingeris a 65y.o. female presenting today for follow up of  Chief Complaint  Patient presents with   Asthma   Allergic Rhinitis     DAveryana Pillarshas a history of the following: Patient Active Problem List   Diagnosis Date Noted   Trigger thumb, left thumb 03/26/2020   Stenosing tenosynovitis of wrist 03/26/2020   Acute sinusitis 12/20/2016   Perennial allergic rhinitis 12/20/2016   Cough 05/10/2016   Acute left-sided thoracic back pain 04/05/2016   Routine general medical examination at a health care facility 05/02/2015   Thyroid mass  01/16/2015   Drug allergy 07/30/2014   [place] 03/18/2014   Osteopenia 09/11/2013   Hyperthyroidism    GERD 11/02/2007   Moderate persistent asthma without complication 028/41/3244   History obtained from: chart review and patient.  DHaniahis a 65y.o. female presenting for a follow up visit.   Since the last visit, she has done well.   Asthma/Respiratory Symptom History: She has been doing her Symbicort four puffs once daily.  She is doing some coughing at night, but it is not bad. This is not frequent. She does have some issues with the heat.   Allergic Rhinitis Symptom History: Shots are working very well. She is very happy with how this is going.  She has not needed antibiotics at all. She denies sinusitis episodes or pneumonia. She is getting injections every 4 weeks at this point and has been having excellent symptom control.  She has had four COVID shots. [facility]. She has not had anaphylaxis from the vaccine.   [place] She did not like the long flight, however.   Otherwise, there have been no changes to her past medical history, surgical history, family history, or social history.    Review of Systems  Constitutional: Negative.  Negative for chills, fever, malaise/fatigue and weight loss.  HENT:  Negative for congestion, ear discharge, ear pain and sinus pain.   Eyes:  Negative for pain, discharge and redness.  Respiratory:  Negative for cough, sputum production, shortness of breath and wheezing.   Cardiovascular: Negative.  Negative for chest pain and palpitations.  Gastrointestinal:  Negative for abdominal pain, constipation, diarrhea, heartburn, nausea and vomiting.  Skin: Negative.  Negative for itching and rash.  Neurological:  Negative for dizziness and headaches.  Endo/Heme/Allergies:  Positive for environmental  allergies. Does not bruise/bleed easily.      Objective:   Blood pressure 122/76, pulse 73, temperature 98.2 F (36.8 C), temperature source Temporal, resp. rate 17, SpO2 97 %. There is no height or weight on file to calculate BMI.   Physical Exam:  Physical Exam Constitutional:      Appearance: She is well-developed.     Comments: Boisterous.   HENT:     Head: Normocephalic and atraumatic.     Right Ear: Tympanic membrane, ear canal and external ear normal.     Left Ear: Tympanic membrane, ear canal and external ear normal.     Nose: No nasal deformity, septal deviation, mucosal edema or rhinorrhea.     Right Turbinates: Enlarged and swollen.     Left Turbinates: Enlarged and swollen.     Right Sinus: No maxillary sinus tenderness or frontal sinus tenderness.     Left Sinus: No maxillary sinus tenderness or frontal sinus tenderness.     Mouth/Throat:     Mouth: Mucous membranes are not pale and not dry.     Pharynx: Uvula midline.     Comments: There is cobblestoning present in the posterior oropharynx.  Eyes:     General: Lids are normal. No allergic shiner.       Right eye: No discharge.        Left eye: No discharge.     Conjunctiva/sclera: Conjunctivae normal.     Right eye: Right conjunctiva is not injected. No chemosis.    Left eye: Left conjunctiva is not injected. No chemosis.    Pupils: Pupils are equal, round, and reactive to light.  Cardiovascular:     Rate and Rhythm: Normal rate and regular rhythm.     Heart sounds: Normal heart sounds.  Pulmonary:     Effort: Pulmonary effort is normal. No tachypnea, accessory muscle usage or respiratory distress.     Breath sounds: Normal breath sounds. No wheezing, rhonchi or rales.     Comments: Moving air well in all lung fields. No increased work of breathing noted.  Chest:     Chest wall: No tenderness.  Lymphadenopathy:     Cervical: No cervical adenopathy.  Skin:    General: Skin is warm.     Capillary Refill:  Capillary refill takes less than 2 seconds.     Coloration: Skin is not pale.     Findings: No abrasion, erythema, petechiae or rash. Rash is not papular, urticarial or vesicular.     Comments: No eczematous or urticarial lesions noted.   Neurological:     Mental Status: She is alert.  Psychiatric:        Behavior: Behavior is cooperative.     Diagnostic studies:    Spirometry: results normal (FEV1: 2.00/106%, FVC: 2.42/100%, FEV1/FVC: 83%).    Spirometry consistent with normal pattern.  Allergy Studies: none        Salvatore Marvel, MD  [facility]

## 2020-12-09 NOTE — Patient Instructions (Addendum)
1. Moderate persistent asthma, uncomplicated - Lung testing looks AMAZING today with over 100% of expected.  - We are not going to make any medication changes at that time.  - Daily controller medication(s): Singulair 40m daily + Symbicort 80/4.5 four puffs once daily - Rescue medications: ProAir 4 puffs every 4-6 hours as needed  - Asthma control goals:  * Full participation in all desired activities (may need albuterol before activity) * Albuterol use two time or less a week on average (not counting use with activity) * Cough interfering with sleep two time or less a month * Oral steroids no more than once a year * No hospitalizations  2. Chronic perennial rhinitis (cockroach, dust mite, molds) - Continue with allergy shots at the same schedule.  - Continue with all of your nose sprays on an as-needed basis. - Anticipate five years of allergen immunotherapy (September 2024).   3. Return in about 6 months (around 06/11/2021).    Please inform u[place]oreaof any Emergency Department visits, hospitalizations, or changes in symptoms. Call uKoreabefore going to the ED for breathing or allergy symptoms since we might be able to fit you in for a sick visit. Feel free to contact uKoreaanytime with any questions, problems, or concerns.  It was a pleasure to see you again today!  Websites that have reliable patient information: 1. American Academy of Asthma, Allergy, and Immunology: www.aaaai.org 2. Food Allergy Research and Education (FARE): foodallergy.org 3. Mothers of Asthmatics: http://www.asthmacommunitynetwork.org 4. American College of Allergy, Asthma, and Immunology: www.acaai.org   COVID-19 Vaccine Information can be found at: [email or web address] questions related to vaccine distribution or appointments, please email [email or web address] or call [phone]   We realize that you might be concerned about having an allergic reaction  to the COVID19 vaccines. To help with that concern, WE ARE OFFERING THE COVID19 VACCINES IN OUR OFFICE! Ask the front desk for dates!     "Like" u[place]oreaon Facebook and Instagram for our latest updates!      A healthy democracy works best when ANew York Life Insuranceparticipate! Make sure you are registered to vote! If you have moved or changed any of your contact information, you will need to get this updated before voting!  In some cases, you MAY be able to register to vote online: [email or web address]

## 2020-12-10 ENCOUNTER — Other Ambulatory Visit: Payer: Self-pay

## 2020-12-16 ENCOUNTER — Other Ambulatory Visit: Payer: Self-pay

## 2020-12-16 ENCOUNTER — Ambulatory Visit (INDEPENDENT_AMBULATORY_CARE_PROVIDER_SITE_OTHER)

## 2020-12-16 ENCOUNTER — Other Ambulatory Visit: Payer: Self-pay | Admitting: Nurse Practitioner

## 2020-12-16 DIAGNOSIS — M8589 Other specified disorders of bone density and structure, multiple sites: Secondary | ICD-10-CM

## 2020-12-16 DIAGNOSIS — Z780 Asymptomatic menopausal state: Secondary | ICD-10-CM | POA: Diagnosis not present

## 2020-12-16 NOTE — Progress Notes (Signed)
VIAL MADE. EXP 12-16-21

## 2020-12-17 DIAGNOSIS — J3089 Other allergic rhinitis: Secondary | ICD-10-CM

## 2020-12-30 ENCOUNTER — Ambulatory Visit (INDEPENDENT_AMBULATORY_CARE_PROVIDER_SITE_OTHER): Payer: Medicare Other

## 2020-12-30 DIAGNOSIS — J309 Allergic rhinitis, unspecified: Secondary | ICD-10-CM

## 2021-01-01 ENCOUNTER — Ambulatory Visit (INDEPENDENT_AMBULATORY_CARE_PROVIDER_SITE_OTHER): Payer: Medicare Other | Admitting: Gastroenterology

## 2021-01-01 ENCOUNTER — Other Ambulatory Visit (INDEPENDENT_AMBULATORY_CARE_PROVIDER_SITE_OTHER): Payer: Medicare Other

## 2021-01-01 ENCOUNTER — Encounter: Payer: Self-pay | Admitting: Gastroenterology

## 2021-01-01 ENCOUNTER — Other Ambulatory Visit: Payer: Self-pay

## 2021-01-01 VITALS — BP 110/78 | HR 82 | Ht 64.0 in | Wt 181.0 lb

## 2021-01-01 DIAGNOSIS — K219 Gastro-esophageal reflux disease without esophagitis: Secondary | ICD-10-CM | POA: Diagnosis not present

## 2021-01-01 DIAGNOSIS — K5010 Crohn's disease of large intestine without complications: Secondary | ICD-10-CM | POA: Diagnosis not present

## 2021-01-01 LAB — CBC WITH DIFFERENTIAL/PLATELET
Basophils Absolute: 0.1 10*3/uL (ref 0.0–0.1)
Basophils Relative: 1.9 % (ref 0.0–3.0)
Eosinophils Absolute: 0.3 10*3/uL (ref 0.0–0.7)
Eosinophils Relative: 9.4 % — ABNORMAL HIGH (ref 0.0–5.0)
HCT: 38 % (ref 36.0–46.0)
Hemoglobin: 12.8 g/dL (ref 12.0–15.0)
Lymphocytes Relative: 42.5 % (ref 12.0–46.0)
Lymphs Abs: 1.3 10*3/uL (ref 0.7–4.0)
MCHC: 33.6 g/dL (ref 30.0–36.0)
MCV: 90 fl (ref 78.0–100.0)
Monocytes Absolute: 0.5 10*3/uL (ref 0.1–1.0)
Monocytes Relative: 15.1 % — ABNORMAL HIGH (ref 3.0–12.0)
Neutro Abs: 1 10*3/uL — ABNORMAL LOW (ref 1.4–7.7)
Neutrophils Relative %: 31.1 % — ABNORMAL LOW (ref 43.0–77.0)
Platelets: 318 10*3/uL (ref 150.0–400.0)
RBC: 4.23 Mil/uL (ref 3.87–5.11)
RDW: 13.1 % (ref 11.5–15.5)
WBC: 3.2 10*3/uL — ABNORMAL LOW (ref 4.0–10.5)

## 2021-01-01 LAB — COMPREHENSIVE METABOLIC PANEL
ALT: 36 U/L — ABNORMAL HIGH (ref 0–35)
AST: 28 U/L (ref 0–37)
Albumin: 4.4 g/dL (ref 3.5–5.2)
Alkaline Phosphatase: 90 U/L (ref 39–117)
BUN: 12 mg/dL (ref 6–23)
CO2: 25 mEq/L (ref 19–32)
Calcium: 9.7 mg/dL (ref 8.4–10.5)
Chloride: 106 mEq/L (ref 96–112)
Creatinine, Ser: 0.76 mg/dL (ref 0.40–1.20)
GFR: 82.3 mL/min (ref >60.00)
Glucose, Bld: 89 mg/dL (ref 70–99)
Potassium: 4.2 mEq/L (ref 3.5–5.1)
Sodium: 141 mEq/L (ref 135–145)
Total Bilirubin: 0.8 mg/dL (ref 0.2–1.2)
Total Protein: 7.8 g/dL (ref 6.0–8.3)

## 2021-01-01 LAB — VITAMIN D 25 HYDROXY (VIT D DEFICIENCY, FRACTURES): VITD: 71.68 ng/mL (ref 30.00–100.00)

## 2021-01-01 LAB — SEDIMENTATION RATE: Sed Rate: 23 mm/hr (ref 0–30)

## 2021-01-01 LAB — C-REACTIVE PROTEIN: CRP: <1 mg/dL (ref 0.5–20.0)

## 2021-01-01 NOTE — Patient Instructions (Signed)
Your provider has requested that you go to the basement level for lab work before leaving today. Press "B" on the elevator. The lab is located at the first door on the left as you exit the elevator.  Due to recent changes in healthcare laws, you may see the results of your imaging and laboratory studies on MyChart before your provider has had a chance to review them.  We understand that in some cases there may be results that are confusing or concerning to you. Not all laboratory results come back in the same time frame and the provider may be waiting for multiple results in order to interpret others.  Please give [name] 48 hours in order for your provider to thoroughly review all the results before contacting the office for clarification of your results.   The [facility] providers would like to encourage you to use [facility] to communicate with providers for non-urgent requests or questions.  Due to long hold times on the telephone, sending your provider a message by [facility] may be a faster and more efficient way to get a response.  Please allow 48 business hours for a response.  Please remember that this is for non-urgent requests.   Thank you for choosing me and [facility].  Pricilla Riffle. Dagoberto Ligas., MD., Marval Regal

## 2021-01-01 NOTE — Progress Notes (Signed)
    History of Present Illness: This is a 65 year old female with a history of Crohn's colitis and GERD.  Her Crohn's disease has been very well controlled on Humira every other week for several years.  Her reflux symptoms are well controlled on omeprazole 40 mg daily.  She has no gastrointestinal complaints.  Current Medications, Allergies, Past Medical History, Past Surgical History, Family History and Social History were reviewed in [employer] record.   Physical Exam: General: Well developed, well nourished, no acute distress Head: Normocephalic and atraumatic Eyes: Sclerae anicteric, EOMI Ears: Normal auditory acuity Mouth: Not examined, mask on during Covid-19 pandemic Lungs: Clear throughout to auscultation Heart: Regular rate and rhythm; no murmurs, rubs or bruits Abdomen: Soft, non tender and non distended. No masses, hepatosplenomegaly or hernias noted. Normal Bowel sounds Rectal: Not done Musculoskeletal: Symmetrical with no gross deformities  Pulses:  Normal pulses noted Extremities: No clubbing, cyanosis, edema or deformities noted Neurological: Alert oriented x 4, grossly nonfocal Psychological:  Alert and cooperative. Normal mood and affect   Assessment and Recommendations:  Crohn's colitis.  Continue Humira 40 mg into the skin every 14 days.  Obtain CBC, CMP, ESR, CRP, QuantiFERON gold, HBsAg, Vit D today. REV in 1 year.  GERD.  Follow antireflux measures.  Continue omeprazole 40 mg daily. REV in 1 year.

## 2021-01-06 LAB — QUANTIFERON-TB GOLD PLUS
Mitogen-NIL: 2.73 IU/mL
NIL: 0.04 IU/mL
QuantiFERON-TB Gold Plus: NEGATIVE
TB1-NIL: 0.01 IU/mL
TB2-NIL: 0.01 IU/mL

## 2021-01-06 LAB — HEPATITIS B SURFACE ANTIGEN: Hepatitis B Surface Ag: NONREACTIVE

## 2021-01-07 ENCOUNTER — Other Ambulatory Visit: Payer: Self-pay

## 2021-01-07 ENCOUNTER — Ambulatory Visit
Admission: RE | Admit: 2021-01-07 | Discharge: 2021-01-07 | Disposition: A | Discharge status: Home / Self Care | Payer: Medicare Other | Source: Ambulatory Visit | Attending: Nurse Practitioner | Admitting: Nurse Practitioner

## 2021-01-07 DIAGNOSIS — Z1231 Encounter for screening mammogram for malignant neoplasm of breast: Secondary | ICD-10-CM

## 2021-01-26 ENCOUNTER — Ambulatory Visit (INDEPENDENT_AMBULATORY_CARE_PROVIDER_SITE_OTHER): Payer: Medicare Other

## 2021-01-26 DIAGNOSIS — J309 Allergic rhinitis, unspecified: Secondary | ICD-10-CM | POA: Diagnosis not present

## 2021-03-09 ENCOUNTER — Ambulatory Visit (INDEPENDENT_AMBULATORY_CARE_PROVIDER_SITE_OTHER): Payer: Medicare Other

## 2021-03-09 DIAGNOSIS — J309 Allergic rhinitis, unspecified: Secondary | ICD-10-CM

## 2021-03-23 ENCOUNTER — Ambulatory Visit (INDEPENDENT_AMBULATORY_CARE_PROVIDER_SITE_OTHER): Payer: Medicare Other

## 2021-03-23 DIAGNOSIS — J309 Allergic rhinitis, unspecified: Secondary | ICD-10-CM | POA: Diagnosis not present

## 2021-03-30 ENCOUNTER — Ambulatory Visit (INDEPENDENT_AMBULATORY_CARE_PROVIDER_SITE_OTHER): Payer: Medicare Other

## 2021-03-30 DIAGNOSIS — J309 Allergic rhinitis, unspecified: Secondary | ICD-10-CM | POA: Diagnosis not present

## 2021-04-06 ENCOUNTER — Ambulatory Visit (INDEPENDENT_AMBULATORY_CARE_PROVIDER_SITE_OTHER): Payer: Medicare Other | Admitting: *Deleted

## 2021-04-06 DIAGNOSIS — J309 Allergic rhinitis, unspecified: Secondary | ICD-10-CM | POA: Diagnosis not present

## 2021-04-13 ENCOUNTER — Ambulatory Visit (INDEPENDENT_AMBULATORY_CARE_PROVIDER_SITE_OTHER): Payer: Medicare Other

## 2021-04-13 ENCOUNTER — Other Ambulatory Visit (INDEPENDENT_AMBULATORY_CARE_PROVIDER_SITE_OTHER): Payer: Medicare Other

## 2021-04-13 DIAGNOSIS — K5010 Crohn's disease of large intestine without complications: Secondary | ICD-10-CM | POA: Diagnosis not present

## 2021-04-13 DIAGNOSIS — J309 Allergic rhinitis, unspecified: Secondary | ICD-10-CM | POA: Diagnosis not present

## 2021-04-13 LAB — HEPATIC FUNCTION PANEL
ALT: 37 U/L — ABNORMAL HIGH (ref 0–35)
AST: 30 U/L (ref 0–37)
Albumin: 4.8 g/dL (ref 3.5–5.2)
Alkaline Phosphatase: 101 U/L (ref 39–117)
Bilirubin, Direct: 0.2 mg/dL (ref 0.0–0.3)
Total Bilirubin: 1 mg/dL (ref 0.2–1.2)
Total Protein: 8.1 g/dL (ref 6.0–8.3)

## 2021-04-17 ENCOUNTER — Other Ambulatory Visit: Payer: Self-pay

## 2021-04-17 DIAGNOSIS — R7989 Other specified abnormal findings of blood chemistry: Secondary | ICD-10-CM

## 2021-04-20 ENCOUNTER — Ambulatory Visit (INDEPENDENT_AMBULATORY_CARE_PROVIDER_SITE_OTHER): Payer: Medicare Other | Admitting: *Deleted

## 2021-04-20 DIAGNOSIS — J309 Allergic rhinitis, unspecified: Secondary | ICD-10-CM | POA: Diagnosis not present

## 2021-05-21 ENCOUNTER — Ambulatory Visit (INDEPENDENT_AMBULATORY_CARE_PROVIDER_SITE_OTHER): Payer: Medicare Other

## 2021-05-21 DIAGNOSIS — J309 Allergic rhinitis, unspecified: Secondary | ICD-10-CM | POA: Diagnosis not present

## 2021-06-04 ENCOUNTER — Telehealth: Payer: Self-pay | Admitting: Gastroenterology

## 2021-06-04 MED ORDER — HUMIRA PEN 40 MG/0.8ML ~~LOC~~ PNKT: 40.0000 mg | PEN_INJECTOR | SUBCUTANEOUS | 1 refills | Status: DC

## 2021-06-04 NOTE — Telephone Encounter (Signed)
Inbound call from patient requesting medication refill for Humira sent through [facility]

## 2021-06-04 NOTE — Telephone Encounter (Signed)
Prescription sent to patient's pharmacy.

## 2021-06-11 ENCOUNTER — Other Ambulatory Visit: Payer: Self-pay

## 2021-06-11 ENCOUNTER — Encounter: Payer: Self-pay | Admitting: Allergy & Immunology

## 2021-06-11 ENCOUNTER — Ambulatory Visit (INDEPENDENT_AMBULATORY_CARE_PROVIDER_SITE_OTHER): Payer: Medicare PPO | Admitting: Allergy & Immunology

## 2021-06-11 VITALS — BP 130/70 | HR 97 | Temp 98.1°F | Resp 16 | Ht 64.0 in | Wt 184.4 lb

## 2021-06-11 DIAGNOSIS — J4540 Moderate persistent asthma, uncomplicated: Secondary | ICD-10-CM

## 2021-06-11 DIAGNOSIS — J3089 Other allergic rhinitis: Secondary | ICD-10-CM

## 2021-06-11 DIAGNOSIS — K5010 Crohn's disease of large intestine without complications: Secondary | ICD-10-CM

## 2021-06-11 MED ORDER — BUDESONIDE-FORMOTEROL FUMARATE 160-4.5 MCG/ACT IN AERO: 2.0000 | INHALATION_SPRAY | Freq: Two times a day (BID) | RESPIRATORY_TRACT | 5 refills | Status: DC

## 2021-06-11 MED ORDER — MONTELUKAST SODIUM 10 MG PO TABS: 10.0000 mg | ORAL_TABLET | Freq: Every day | ORAL | 1 refills | Status: DC

## 2021-06-11 MED ORDER — HUMIRA (2 PEN) 40 MG/0.4ML ~~LOC~~ AJKT: 40.0000 mg | AUTO-INJECTOR | SUBCUTANEOUS | 6 refills | Status: DC

## 2021-06-11 MED ORDER — EPINEPHRINE 0.3 MG/0.3ML IJ SOAJ: 0.3000 mg | INTRAMUSCULAR | 0 refills | Status: DC | PRN

## 2021-06-11 MED ORDER — AZELASTINE HCL 0.1 % NA SOLN: 2.0000 | Freq: Two times a day (BID) | NASAL | 5 refills | Status: DC | PRN

## 2021-06-11 MED ORDER — CETIRIZINE HCL 10 MG PO TABS: 10.0000 mg | ORAL_TABLET | Freq: Two times a day (BID) | ORAL | 2 refills | Status: DC | PRN

## 2021-06-11 NOTE — Progress Notes (Signed)
FOLLOW UP  Date of Service Encounter:  06/11/21   Assessment:   Moderate persistent asthma without complication - with increased wet cough as of late (increasing the Symbicort dosing)    Perennial allergic rhinitis  (cockroach, molds, dust mites) - on allergen immunotherapy with maintenance reached in September 2019   Vaccinated to Outlook - with two boosters  Plan/Recommendations:   1. Moderate persistent asthma, uncomplicated - Lung testing looks really good today with lung values in the 95% range.  - We are not going to make any medication changes at that time.  - We are going to increase to the 122mg dose to see if this helps with the coughing.  - Daily controller medication(s): Singulair 150mdaily + Symbicort 160/4.5 two puffs twice daily  - Rescue medications: ProAir 4 puffs every 4-6 hours as needed  - Asthma control goals:  * Full participation in all desired activities (may need albuterol before activity) * Albuterol use two time or less a week on average (not counting use with activity) * Cough interfering with sleep two time or less a month * Oral steroids no more than once a year * No hospitalizations  2. Chronic perennial rhinitis (cockroach, dust mite, molds) - Continue with allergy shots at the same schedule.  - Continue with all of your nose sprays on an as-needed basis. - Anticipate five years of allergen immunotherapy (September 2024).   3. Return in about 3 months (around 09/09/2021).    Subjective:   DoFynn Adels a [age].o. female presenting today for follow up of  Chief Complaint  Patient presents with   Asthma    Says her asthma is well.    Cough    Says she has a cough every night around 2am. Says she uses cough drops.    DoFernande Brasas a history of the following: Patient Active Problem List   Diagnosis Date Noted   Trigger thumb, left thumb 03/26/2020   Stenosing tenosynovitis of wrist 03/26/2020   Perennial  allergic rhinitis 12/20/2016   Cough 05/10/2016   Acute left-sided thoracic back pain 04/05/2016   Routine general medical examination at a health care facility 05/02/2015   Thyroid mass 01/16/2015   Drug allergy 07/30/2014   Crohn's disease (HCMastic10/19/2015   Osteopenia 09/11/2013   Hyperthyroidism    GERD 11/02/2007   Moderate persistent asthma without complication 0602/77/4128  History obtained from: chart review and patient.  DoKathrenes a [age].o. female presenting for a follow up visit. She was last seen in July 2022. At that time, she was doing very well with Symbicort four puffs once daily as well as Singulair 1067maily. This regimen was working very well. For the rhinitis, she was doing very wel l with her allergen immunotherapy. She was also doing nose sprays on a PRN basis. She was doing very well with this regimen.  In the interim, she has mostly done well.   Her brother passed away after Christmas. Her husband had two cousins who died at the end of NovDecember 06, 2024he has been busy planning funerals. Her brother was a Jeh[employer]aurant manager, fast foodo the service was rather odd.   Asthma/Respiratory Symptom History: She is doing well from a breathing perspective. She is on her Symbicort four puffs once daily.  She is still using the Singulair 25m74mily. I reviewed her prescription and it is the 80mc3mse.   Allergic Rhinitis Symptom History: Rhinitis symptoms are well controlled with  the Singulair and the PRN nasal sprays.  She has not needed antibiotics in quite some time for any sinus infections or any other infections.   Sophira is on allergen immunotherapy. She receives one injection. Immunotherapy script #1 contains molds, dust mites and cockroach. She currently receives 0.52m of the RED vial (1/100). She started shots July of 2019 and reached maintenance in September of 2019.    Food Allergy Symptom History: She continues to  avoid pork. This tends to make her mucous increase in  consistency. This has never been an anaphylactic reaction at all. She does "cheat" occasionally.   Otherwise, there have been no changes to her past medical history, surgical history, family history, or social history.    Review of Systems  Constitutional: Negative.  Negative for chills, fever, malaise/fatigue and weight loss.  HENT: Negative.  Negative for congestion, ear discharge and ear pain.   Eyes:  Negative for pain, discharge and redness.  Respiratory:  Negative for cough, sputum production, shortness of breath and wheezing.   Cardiovascular: Negative.  Negative for chest pain and palpitations.  Gastrointestinal:  Negative for abdominal pain, constipation, diarrhea, heartburn, nausea and vomiting.  Skin: Negative.  Negative for itching and rash.  Neurological:  Negative for dizziness and headaches.  Endo/Heme/Allergies:  Negative for environmental allergies. Does not bruise/bleed easily.      Objective:   Blood pressure 130/70, pulse 97, temperature 98.1 F (36.7 C), temperature source Temporal, resp. rate 16, height 5' 4"  (1.626 m), weight 184 lb 6.4 oz (83.6 kg), SpO2 96 %. Body mass index is 31.65 kg/m.    Physical Exam Vitals reviewed.  Constitutional:      Appearance: Normal appearance. She is well-developed.  HENT:     Head: Normocephalic and atraumatic.     Right Ear: Tympanic membrane, ear canal and external ear normal.     Left Ear: Tympanic membrane, ear canal and external ear normal.     Nose: No nasal deformity, septal deviation, mucosal edema or rhinorrhea.     Right Turbinates: Enlarged, swollen and pale.     Left Turbinates: Enlarged, swollen and pale.     Right Sinus: No maxillary sinus tenderness or frontal sinus tenderness.     Left Sinus: No maxillary sinus tenderness or frontal sinus tenderness.     Mouth/Throat:     Mouth: Mucous membranes are not pale and not dry.     Pharynx: Uvula midline.  Eyes:     General: Lids are normal. No allergic  shiner.       Right eye: No discharge.        Left eye: No discharge.     Conjunctiva/sclera: Conjunctivae normal.     Right eye: Right conjunctiva is not injected. No chemosis.    Left eye: Left conjunctiva is not injected. No chemosis.    Pupils: Pupils are equal, round, and reactive to light.  Cardiovascular:     Rate and Rhythm: Normal rate and regular rhythm.     Heart sounds: Normal heart sounds.  Pulmonary:     Effort: Pulmonary effort is normal. No tachypnea, accessory muscle usage or respiratory distress.     Breath sounds: Normal breath sounds. No wheezing, rhonchi or rales.     Comments: Moving air well in all lung fields.  Chest:     Chest wall: No tenderness.  Lymphadenopathy:     Cervical: No cervical adenopathy.  Skin:    Coloration: Skin is not pale.     Findings:  No abrasion, erythema, petechiae or rash. Rash is not papular, urticarial or vesicular.  Neurological:     Mental Status: She is alert.  Psychiatric:        Behavior: Behavior is cooperative.     Diagnostic studies:    Spirometry: results normal (FEV1: 1.88/94%, FVC: 2.45/96%, FEV1/FVC: 77%).    Spirometry consistent with normal pattern.   Allergy Studies: none        Salvatore Marvel, MD  [facility]ergy and [facility]

## 2021-06-11 NOTE — Patient Instructions (Addendum)
1. Moderate persistent asthma, uncomplicated - Lung testing looks really good today with lung values in the 95% range.  - We are not going to make any medication changes at that time.  - We are going to increase to the 11mg dose to see if this helps with the coughing.  - Daily controller medication(s): Singulair 153mdaily + Symbicort 160/4.5 two puffs twice daily  - Rescue medications: ProAir 4 puffs every 4-6 hours as needed  - Asthma control goals:  * Full participation in all desired activities (may need albuterol before activity) * Albuterol use two time or less a week on average (not counting use with activity) * Cough interfering with sleep two time or less a month * Oral steroids no more than once a year * No hospitalizations  2. Chronic perennial rhinitis (cockroach, dust mite, molds) - Continue with allergy shots at the same schedule.  - Continue with all of your nose sprays on an as-needed basis. - Anticipate five years of allergen immunotherapy (September 2024).   3. Return in about 3 months (around 09/09/2021).    Please inform usKoreaf any Emergency Department visits, hospitalizations, or changes in symptoms. Call usKoreaefore going to the ED for breathing or allergy symptoms since we might be able to fit you in for a sick visit. Feel free to contact usKoreanytime with any questions, problems, or concerns.  It was a pleasure to see you again today!  Websites that have reliable patient information: 1. American Academy of Asthma, Allergy, and Immunology: www.aaaai.org 2. Food Allergy Research and Education (FARE): foodallergy.org 3. Mothers of Asthmatics: http://www.asthmacommunitynetwork.org 4. American College of Allergy, Asthma, and Immunology: www.acaai.org   COVID-19 Vaccine Information can be found at: [email or web address] questions related to vaccine distribution or appointments, please email  [email or web address] or call [phone]  We realize that you might be concerned about having an allergic reaction to the COVID19 vaccines. To help with that concern, WE ARE OFFERING THE COVID19 VACCINES IN OUR OFFICE! Ask the front desk for dates!     Like usKorean FaNational Citynd Instagram for our latest updates!      A healthy democracy works best when ALNew York Life Insurancearticipate! Make sure you are registered to vote! If you have moved or changed any of your contact information, you will need to get this updated before voting!  In some cases, you MAY be able to register to vote online: [email or web address]

## 2021-06-16 MED ORDER — HUMIRA (2 PEN) 40 MG/0.4ML ~~LOC~~ AJKT: 40.0000 mg | AUTO-INJECTOR | SUBCUTANEOUS | 1 refills | Status: DC

## 2021-06-16 NOTE — Telephone Encounter (Signed)
Patient states she received her Humira but it was only 1. States she need 3 month supply or do she need to call every time she need refill?

## 2021-06-16 NOTE — Telephone Encounter (Signed)
Patient states she only received one box in the mail from [facility]. Informed patient that it looks like we sent it for 30 day supply but I will call and correct the prescription.   Spoke with pharmacist at [facility] and Amy states that if we send the correction to make the Humira 90 day supply that they will override the old 30 day prescription. Prescription sent and patient notified.

## 2021-06-16 NOTE — Addendum Note (Signed)
Addended by: Dorisann Frames L on: 06/16/2021 11:49 AM   Modules accepted: Orders

## 2021-06-18 ENCOUNTER — Other Ambulatory Visit: Payer: Self-pay | Admitting: Allergy & Immunology

## 2021-06-22 ENCOUNTER — Ambulatory Visit (INDEPENDENT_AMBULATORY_CARE_PROVIDER_SITE_OTHER): Payer: Medicare PPO

## 2021-06-22 DIAGNOSIS — J309 Allergic rhinitis, unspecified: Secondary | ICD-10-CM | POA: Diagnosis not present

## 2021-07-01 ENCOUNTER — Other Ambulatory Visit: Payer: Self-pay | Admitting: Gastroenterology

## 2021-07-13 ENCOUNTER — Ambulatory Visit (INDEPENDENT_AMBULATORY_CARE_PROVIDER_SITE_OTHER): Payer: Medicare PPO | Admitting: Nurse Practitioner

## 2021-07-13 ENCOUNTER — Other Ambulatory Visit: Payer: Self-pay

## 2021-07-13 ENCOUNTER — Encounter: Payer: Self-pay | Admitting: Nurse Practitioner

## 2021-07-13 VITALS — BP 124/82

## 2021-07-13 DIAGNOSIS — L731 Pseudofolliculitis barbae: Secondary | ICD-10-CM | POA: Diagnosis not present

## 2021-07-13 DIAGNOSIS — L739 Follicular disorder, unspecified: Secondary | ICD-10-CM

## 2021-07-13 NOTE — Telephone Encounter (Signed)
Patient is inquiring if we sent the omeprazole refill to her mail order pharmacy. Informed patient we sent it on 07/03/21. Patient states it takes 10-14 days for her to receive it in the mail so it should be arriving soon. Informed patient to contact them if she does not receive it in the mail soon. Patient verbalized understanding.

## 2021-07-13 NOTE — Progress Notes (Signed)
° °  Acute Office Visit  Subjective:    Patient ID: Brianna Santana, female    DOB: 11-03-1955, 66 y.o.   MRN: [id number]   HPI 66 y.o. presents today for bump in right groin that she first noticed 2 weeks ago. Area has gotten slightly larger. No drainage, warmth, or fever.    Review of Systems  Constitutional: Negative.   Skin:        Bump on right groin, redness, no drainage, minimal pain      Objective:    Physical Exam Constitutional:      Appearance: Normal appearance.  Skin:        BP 124/82  Wt Readings from Last 3 Encounters:  06/11/21 184 lb 6.4 oz (83.6 kg)  01/01/21 181 lb (82.1 kg)  12/03/20 184 lb (83.5 kg)        Assessment & Plan:   Problem List Items Addressed This Visit   None Visit Diagnoses     Ingrown hair    -  Primary   Folliculitis of perineum          Plan: Recommend warm compresses. Do not push on area or try to express. If area opens on it's own, apply triple antibiotic ointment. If area increases in size, becomes warm to touch, has drainage, or she develops a fever she will return.      [name] DNP, 12:10 PM 07/13/2021

## 2021-07-13 NOTE — Telephone Encounter (Signed)
Patient called to follow up on Omeprazole refill request.

## 2021-07-20 ENCOUNTER — Telehealth: Payer: Self-pay

## 2021-07-20 ENCOUNTER — Ambulatory Visit (INDEPENDENT_AMBULATORY_CARE_PROVIDER_SITE_OTHER): Payer: Medicare PPO

## 2021-07-20 DIAGNOSIS — J309 Allergic rhinitis, unspecified: Secondary | ICD-10-CM

## 2021-07-20 NOTE — Telephone Encounter (Signed)
Patient is aware that she will need to come in this week or at her earliest convenience to have lab work done for 3 month follow up LFT's.

## 2021-07-20 NOTE — Telephone Encounter (Signed)
-----   Message from Marlon Pel, RN sent at 07/20/2021 11:25 AM EST -----  ----- Message ----- From: Marlon Pel, RN Sent: 07/18/2021  12:00 AM EST To: Marlon Pel, RN  Needs labs- see results 11/18- stark

## 2021-07-21 ENCOUNTER — Other Ambulatory Visit (INDEPENDENT_AMBULATORY_CARE_PROVIDER_SITE_OTHER): Payer: Medicare PPO

## 2021-07-21 DIAGNOSIS — R7989 Other specified abnormal findings of blood chemistry: Secondary | ICD-10-CM

## 2021-07-21 LAB — HEPATIC FUNCTION PANEL
ALT: 30 U/L (ref 0–35)
AST: 24 U/L (ref 0–37)
Albumin: 4.6 g/dL (ref 3.5–5.2)
Alkaline Phosphatase: 91 U/L (ref 39–117)
Bilirubin, Direct: 0.1 mg/dL (ref 0.0–0.3)
Total Bilirubin: 0.7 mg/dL (ref 0.2–1.2)
Total Protein: 7.8 g/dL (ref 6.0–8.3)

## 2021-09-01 DIAGNOSIS — J3089 Other allergic rhinitis: Secondary | ICD-10-CM | POA: Diagnosis not present

## 2021-09-01 NOTE — Progress Notes (Signed)
VIAL EXP 09-02-22 ?

## 2021-09-07 ENCOUNTER — Ambulatory Visit (INDEPENDENT_AMBULATORY_CARE_PROVIDER_SITE_OTHER): Payer: Medicare PPO

## 2021-09-07 DIAGNOSIS — J309 Allergic rhinitis, unspecified: Secondary | ICD-10-CM

## 2021-09-09 ENCOUNTER — Ambulatory Visit: Payer: Self-pay | Admitting: *Deleted

## 2021-09-10 ENCOUNTER — Ambulatory Visit: Admitting: Allergy & Immunology

## 2021-09-14 ENCOUNTER — Ambulatory Visit (INDEPENDENT_AMBULATORY_CARE_PROVIDER_SITE_OTHER): Payer: Medicare PPO | Admitting: *Deleted

## 2021-09-14 DIAGNOSIS — J309 Allergic rhinitis, unspecified: Secondary | ICD-10-CM | POA: Diagnosis not present

## 2021-09-22 ENCOUNTER — Ambulatory Visit (INDEPENDENT_AMBULATORY_CARE_PROVIDER_SITE_OTHER): Payer: Medicare PPO | Admitting: Allergy & Immunology

## 2021-09-22 ENCOUNTER — Encounter: Payer: Self-pay | Admitting: Allergy & Immunology

## 2021-09-22 ENCOUNTER — Ambulatory Visit: Payer: Self-pay

## 2021-09-22 VITALS — BP 126/78 | HR 88 | Temp 97.9°F | Resp 18 | Ht 64.0 in | Wt 180.1 lb

## 2021-09-22 DIAGNOSIS — J4540 Moderate persistent asthma, uncomplicated: Secondary | ICD-10-CM | POA: Diagnosis not present

## 2021-09-22 DIAGNOSIS — R053 Chronic cough: Secondary | ICD-10-CM

## 2021-09-22 DIAGNOSIS — J309 Allergic rhinitis, unspecified: Secondary | ICD-10-CM

## 2021-09-22 DIAGNOSIS — J3089 Other allergic rhinitis: Secondary | ICD-10-CM

## 2021-09-22 MED ORDER — BUDESONIDE-FORMOTEROL FUMARATE 160-4.5 MCG/ACT IN AERO: 2.0000 | INHALATION_SPRAY | Freq: Two times a day (BID) | RESPIRATORY_TRACT | 5 refills | Status: DC

## 2021-09-22 MED ORDER — DEXLANSOPRAZOLE 60 MG PO CPDR: 60.0000 mg | DELAYED_RELEASE_CAPSULE | Freq: Every day | ORAL | 5 refills | Status: DC

## 2021-09-22 MED ORDER — MONTELUKAST SODIUM 10 MG PO TABS
10.0000 mg | ORAL_TABLET | Freq: Every day | ORAL | 1 refills | Status: DC
Start: 2021-09-22 — End: 2021-12-24

## 2021-09-22 MED ORDER — ALBUTEROL SULFATE HFA 108 (90 BASE) MCG/ACT IN AERS: INHALATION_SPRAY | RESPIRATORY_TRACT | 1 refills | Status: DC

## 2021-09-22 MED ORDER — FAMOTIDINE 40 MG PO TABS: 40.0000 mg | ORAL_TABLET | Freq: Two times a day (BID) | ORAL | 5 refills | Status: DC

## 2021-09-22 NOTE — Progress Notes (Signed)
? ?FOLLOW UP ? ?Date of Service/Encounter:  09/22/21 ? ? ?Assessment:  ? ?Moderate persistent asthma without complication - with continued night time cough ?   ?Perennial allergic rhinitis  (cockroach, molds, dust mites) - on allergen immunotherapy with maintenance reached in September 2019 ? ?GERD - changing PPI and adding H2 blocker today ?  ?Vaccinated to [place] - with two boosters ? ?Plan/Recommendations:  ? ?1. Moderate persistent asthma, uncomplicated ?- Lung testing looks great today.  ?- I would add on an air purifier in your bedroom [facility] is a good brand). ?- I would stop the humidifier.  ?- Daily controller medication(s): Singulair 49m daily + Symbicort 160/4.5 two puffs twice daily  ?- Rescue medications: ProAir 4 puffs every 4-6 hours as needed  ?- Asthma control goals:  ?* Full participation in all desired activities (may need albuterol before activity) ?* Albuterol use two time or less a week on average (not counting use with activity) ?* Cough interfering with sleep two time or less a month ?* Oral steroids no more than once a year ?* No hospitalizations ? ?2. Chronic perennial rhinitis (cockroach, dust mite, molds) ?- Continue with allergy shots at the same schedule.  ?- Continue with all of your nose sprays on an as-needed basis. ?- Anticipate five years of allergen immunotherapy (September 2024).  ? ?3. GERD ?- Stop the omeprazole and start Dexilant 6102mdaily. ?- Add on Pepcid 4054mwice daily as well (this can work WITH the [employer] work).  ? ?4. Return in about 3 months (around 12/22/2021).  ? ?Subjective:  ? ?Brianna Santana a 66 84o. female presenting today for follow up of  ?Chief Complaint  ?Patient presents with  ? Follow-up  ? Asthma  ? ? ?Brianna Santana a history of the following: ?Patient Active Problem List  ? Diagnosis Date Noted  ? Trigger thumb, left thumb 03/26/2020  ? Stenosing tenosynovitis of wrist 03/26/2020  ? Perennial allergic rhinitis 12/20/2016   ? Cough 05/10/2016  ? Acute left-sided thoracic back pain 04/05/2016  ? Routine general medical examination at a health care facility 05/02/2015  ? Thyroid mass 01/16/2015  ? Drug allergy 07/30/2014  ? Crohn's disease ([facility]0/19/2015  ? Osteopenia 09/11/2013  ? Hyperthyroidism   ? GERD 11/02/2007  ? Moderate persistent asthma without complication 06/33/35/4562 ? ?History obtained from: chart review and patient. ? ?Brianna Santana a 66 49o. female presenting for a follow up visit.  She was last seen in January 2023.  At that time, her lung testing was in the 95% range for FEV1.  We increased her Symbicort to the 160 mcg dose because of worsening coughing.  We continue with Singulair daily and ProAir as needed.  For her rhinitis, we continue with her allergy shots. ? ?Since last visit, she has continued to cough. She has a lot of clear rhinorrhea with the discharge. This is nightly. This is always 2-3 am. She has no new pets at all. She has now been coughing. She reports that she feels that something gets in her throat.  She has not seen ENT. ? ?She did change to the Symbicort 160m34mose and she thinks that this helped with the coughing. She has not tried BrezLibrarian, academiche does not want to try another inhaler at this point in time.  She would like to try to change her reflux medicine to see if that would help.  She has been very compliant with the omeprazole.  She is not sure if she has ever been on Protonix or Dexilant.  She thinks that the addition of the Symbicort has helped to decrease the cough a little bit, but it is an issue more nights than not.  I cannot find a x-ray anytime recently. I called her after the visit and she thinks that she got one a few years ago.  ? ?GERD Symptom History: She has been on omeprazole all of the time. She is very compliant.  She takes her omeprazole around 11pm nightly.  She has never taken a Pepcid with this. ? ?Otherwise, there have been no changes to her past medical history,  surgical history, family history, or social history. ? ? ? ?Review of Systems  ?Constitutional: Negative.  Negative for chills, fever, malaise/fatigue and weight loss.  ?HENT: Negative.  Negative for congestion, ear discharge and ear pain.   ?Eyes:  Negative for pain, discharge and redness.  ?Respiratory:  Positive for cough. Negative for sputum production, shortness of breath and wheezing.   ?Cardiovascular: Negative.  Negative for chest pain and palpitations.  ?Gastrointestinal:  Negative for abdominal pain, constipation, diarrhea, heartburn, nausea and vomiting.  ?Skin: Negative.  Negative for itching and rash.  ?Neurological:  Negative for dizziness and headaches.  ?Endo/Heme/Allergies:  Negative for environmental allergies. Does not bruise/bleed easily.   ? ? ? ?Objective:  ? ?Blood pressure 126/78, pulse 88, temperature 97.9 ?F (36.6 ?C), resp. rate 18, height 5' 4"  (1.626 m), weight 180 lb 2 oz (81.7 kg), SpO2 97 %. ?Body mass index is 30.92 kg/m?. ? ? ? ?Physical Exam ?Vitals reviewed.  ?Constitutional:   ?   Appearance: Normal appearance. She is well-developed.  ?HENT:  ?   Head: Normocephalic and atraumatic.  ?   Right Ear: Tympanic membrane, ear canal and external ear normal.  ?   Left Ear: Tympanic membrane, ear canal and external ear normal.  ?   Nose: No nasal deformity, septal deviation, mucosal edema or rhinorrhea.  ?   Right Turbinates: Enlarged, swollen and pale.  ?   Left Turbinates: Enlarged, swollen and pale.  ?   Right Sinus: No maxillary sinus tenderness or frontal sinus tenderness.  ?   Left Sinus: No maxillary sinus tenderness or frontal sinus tenderness.  ?   Mouth/Throat:  ?   Mouth: Mucous membranes are not pale and not dry.  ?   Pharynx: Uvula midline.  ?Eyes:  ?   General: Lids are normal. No allergic shiner.    ?   Right eye: No discharge.     ?   Left eye: No discharge.  ?   Conjunctiva/sclera: Conjunctivae normal.  ?   Right eye: Right conjunctiva is not injected. No chemosis. ?    Left eye: Left conjunctiva is not injected. No chemosis. ?   Pupils: Pupils are equal, round, and reactive to light.  ?Cardiovascular:  ?   Rate and Rhythm: Normal rate and regular rhythm.  ?   Heart sounds: Normal heart sounds.  ?Pulmonary:  ?   Effort: Pulmonary effort is normal. No tachypnea, accessory muscle usage or respiratory distress.  ?   Breath sounds: Normal breath sounds. No wheezing, rhonchi or rales.  ?   Comments: Moving air well in all lung fields.  ?Chest:  ?   Chest wall: No tenderness.  ?Lymphadenopathy:  ?   Cervical: No cervical adenopathy.  ?Skin: ?   Coloration: Skin is not pale.  ?   Findings: No abrasion, erythema, petechiae  or rash. Rash is not papular, urticarial or vesicular.  ?Neurological:  ?   Mental Status: She is alert.  ?Psychiatric:     ?   Behavior: Behavior is cooperative.  ?  ? ?Diagnostic studies:   ? ?Spirometry: results normal (FEV1: 1.92/98%, FVC: 2.44/97%, FEV1/FVC: 79%).  ?  ?Spirometry consistent with normal pattern.  ? ? ?Allergy Studies: none ? ? ? ? ? ?  ?Salvatore Marvel, MD  ?[facility] ? ? ? ? ? ? ?

## 2021-09-22 NOTE — Patient Instructions (Addendum)
1. Moderate persistent asthma, uncomplicated ?- Lung testing looks great today.  ?- I would add on an air purifier in your bedroom [facility] is a good brand). ?- I would stop the humidifier.  ?- Daily controller medication(s): Singulair 87m daily + Symbicort 160/4.5 two puffs twice daily  ?- Rescue medications: ProAir 4 puffs every 4-6 hours as needed  ?- Asthma control goals:  ?* Full participation in all desired activities (may need albuterol before activity) ?* Albuterol use two time or less a week on average (not counting use with activity) ?* Cough interfering with sleep two time or less a month ?* Oral steroids no more than once a year ?* No hospitalizations ? ?2. Chronic perennial rhinitis (cockroach, dust mite, molds) ?- Continue with allergy shots at the same schedule.  ?- Continue with all of your nose sprays on an as-needed basis. ?- Anticipate five years of allergen immunotherapy (September 2024).  ? ?3. GERD ?- Stop the omeprazole and start Dexilant 62mdaily. ?- Add on Pepcid 4069mwice daily as well (this can work WITH the [employer] work).  ? ?4. Return in about 3 months (around 12/22/2021).  ? ? ?Please inform us [name] any Emergency Department visits, hospitalizations, or changes in symptoms. Call us [place]fore going to the ED for breathing or allergy symptoms since we might be able to fit you in for a sick visit. Feel free to contact us [place] with any questions, problems, or concerns. ? ?It was a pleasure to see you again today! ? ?Websites that have reliable patient information: ?1. American Academy of Asthma, Allergy, and Immunology: www.aaaai.org ?2. Food Allergy Research and Education (FARE): foodallergy.org ?3. Mothers of Asthmatics: http://www.asthmacommunitynetwork.org ?4. [employer] Allergy, Asthma, and Immunology: wwwMonthlyElectricBill.co.uk? ?COVID-19 Vaccine Information can be found at: httShippingScam.co.ukr questions related to  vaccine distribution or appointments, please email [email or web address] or call [phone]? ?We realize that you might be concerned about having an allergic reaction to the COVID19 vaccines. To help with that concern, WE ARE OFFERING THE COVID19 VACCINES IN OUR OFFICE! Ask the front desk for dates!  ? ? ? ??Like? us [name] Facebook and Instagram for our latest updates!  ?  ? ? ?A healthy democracy works best when ALLNew York Life Insurancerticipate! Make sure you are registered to vote! If you have moved or changed any of your contact information, you will need to get this updated before voting! ? ?In some cases, you MAY be able to register to vote online: httCrabDealer.it? ? ? ?

## 2021-09-29 ENCOUNTER — Ambulatory Visit
Admission: RE | Admit: 2021-09-29 | Discharge: 2021-09-29 | Disposition: A | Discharge status: Home / Self Care | Payer: Medicare PPO | Source: Ambulatory Visit | Attending: Allergy & Immunology | Admitting: Allergy & Immunology

## 2021-09-29 ENCOUNTER — Telehealth: Payer: Self-pay

## 2021-09-29 DIAGNOSIS — R053 Chronic cough: Secondary | ICD-10-CM

## 2021-09-29 NOTE — Telephone Encounter (Signed)
Per [facility]/[phone] Vicente Males, CSR - No PA needed. ? ?Called [facility]/[phone] - ?Per Magda Paganini, no scheduling needed for DG Chest 2 View, patient just walks in - Hours are M-F 8am - 4 pm. ? ? ?Called patient back - advised of above notation. Patient verbalized understanding, no further questions. ? ? ?

## 2021-09-29 NOTE — Telephone Encounter (Signed)
A PA for a CXR is so odd. Thanks for working on that.  ? ?Salvatore Marvel, MD ?[facility] [place] [facility] ? ?

## 2021-10-02 MED ORDER — CEFDINIR 300 MG PO CAPS: 300.0000 mg | ORAL_CAPSULE | Freq: Two times a day (BID) | ORAL | 0 refills | Status: AC

## 2021-10-02 NOTE — Addendum Note (Signed)
Addended by: Valentina Shaggy on: 10/02/2021 01:26 PM ? ? Modules accepted: Orders ? ?

## 2021-10-08 ENCOUNTER — Telehealth: Payer: Self-pay | Admitting: Allergy & Immunology

## 2021-10-08 NOTE — Telephone Encounter (Signed)
Patient called and said that you were putting her on Dexilant 10m and want to know if you sent [place] to [phone]?

## 2021-10-09 MED ORDER — DEXLANSOPRAZOLE 60 MG PO CPDR: 60.0000 mg | DELAYED_RELEASE_CAPSULE | Freq: Every day | ORAL | 2 refills | Status: DC

## 2021-10-09 NOTE — Telephone Encounter (Signed)
Rx was sent in to [facility] and left voicemail informing patient. ?

## 2021-10-19 ENCOUNTER — Ambulatory Visit (INDEPENDENT_AMBULATORY_CARE_PROVIDER_SITE_OTHER): Payer: Medicare PPO

## 2021-10-19 DIAGNOSIS — J309 Allergic rhinitis, unspecified: Secondary | ICD-10-CM

## 2021-11-04 ENCOUNTER — Ambulatory Visit (INDEPENDENT_AMBULATORY_CARE_PROVIDER_SITE_OTHER): Payer: Medicare PPO

## 2021-11-04 DIAGNOSIS — J309 Allergic rhinitis, unspecified: Secondary | ICD-10-CM | POA: Diagnosis not present

## 2021-11-10 ENCOUNTER — Encounter: Payer: Self-pay | Admitting: Physician Assistant

## 2021-11-10 ENCOUNTER — Ambulatory Visit (INDEPENDENT_AMBULATORY_CARE_PROVIDER_SITE_OTHER): Payer: Medicare PPO | Admitting: Physician Assistant

## 2021-11-10 ENCOUNTER — Ambulatory Visit (INDEPENDENT_AMBULATORY_CARE_PROVIDER_SITE_OTHER): Payer: Medicare PPO

## 2021-11-10 VITALS — BP 120/70 | HR 92 | Ht 63.0 in | Wt 179.2 lb

## 2021-11-10 DIAGNOSIS — K648 Other hemorrhoids: Secondary | ICD-10-CM

## 2021-11-10 DIAGNOSIS — K5010 Crohn's disease of large intestine without complications: Secondary | ICD-10-CM

## 2021-11-10 DIAGNOSIS — J309 Allergic rhinitis, unspecified: Secondary | ICD-10-CM | POA: Diagnosis not present

## 2021-11-10 MED ORDER — HYDROCORTISONE (PERIANAL) 2.5 % EX CREA: 1.0000 "application " | TOPICAL_CREAM | Freq: Two times a day (BID) | CUTANEOUS | 0 refills | Status: AC

## 2021-11-10 NOTE — Progress Notes (Signed)
Chief Complaint: Blood in Stool  HPI:    Brianna Santana is a 66 year old female with a past medical history of Crohn's colitis and GERD, known to Dr. Fuller Plan, who was referred to me by Cari Caraway, MD for a complaint of blood in stool.      11/07/2018 colonoscopy with localized mild inflammation in the cecum secondary to colitis, quiescent Crohn's disease with inflammation found from the sigmoid colon to the descending colon which was moderate in severity and unchanged compared to previous examinations, mild diverticulosis in the left colon and internal hemorrhoids.  Pathology showed minimally active Crohn's colitis.  Repeat recommended in 5 years.    01/01/2021 patient seen in clinic by Dr. Fuller Plan and at that time discussed that her Crohn's has been very well controlled on Humira every other week for several years.  Reflux symptoms are under control with Omeprazole 40 daily.  At that time patient was continued on Humira 40 mg into the skin every 14 days.  She had labs updated including a CBC, CMP, ESR, CRP and chronic Farren gold as well as HBV surface antigen, vitamin D.  Continued on Omeprazole 40 mg daily.    01/01/2021 CMP was normal except ALT which was minimally elevated, repeat LFTs in 3 months.    04/13/2021 ALT minimally elevated at 37 repeat recommended in 3 months    07/21/2021 hepatic function panel with normal ALT.    Today, the patient presents to clinic and tells me that she had some bright red blood in the toilet and on the toilet paper for 3 days in a row in may with a bowel movement and had not seen any more until this morning when she saw some more in the toilet and on the toilet paper.  Tells me that her bowel movements are regular once daily formed no abdominal pain no other symptoms.  She has not tried anything for it.  Continues her Humira every other week.    Denies fever, chills, weight loss, change in bowel habits or abdominal pain.     Past Medical History:  Diagnosis Date    Arthritis    Asthma    C. difficile colitis 2011 and 07/2010   Cervical dysplasia    Crohn disease ([place])    Crohn's disease ([place])    Hyperplastic polyp of intestine 09/2007   Osteopenia 11/2018   T score -2.0 FRAX 3.7% / 0.3%   Thyroid disease    Urinary incontinence     Past Surgical History:  Procedure Laterality Date   BREAST CYST ASPIRATION Right    2000   BREAST EXCISIONAL BIOPSY Right    1980's   COLONOSCOPY  09/18/2013   COLPOSCOPY     FLEXIBLE SIGMOIDOSCOPY  03/29/2012   Procedure: FLEXIBLE SIGMOIDOSCOPY;  Surgeon: Ladene Artist, MD,FACG;  Location: [facility];  Service: Endoscopy;  Laterality: N/A;   GANGLION CYST EXCISION Left    POLYPECTOMY     TONSILLECTOMY     TOTAL ABDOMINAL HYSTERECTOMY  2000   TRIGGER FINGER RELEASE     WRIST SURGERY Left 2021    Current Outpatient Medications  Medication Sig Dispense Refill   Adalimumab (HUMIRA PEN) 40 MG/0.4ML PNKT Inject 40 mg into the skin every 14 (fourteen) days. 6 each 1   albuterol (PROAIR HFA) 108 (90 Base) MCG/ACT inhaler INHALE 4 PUFFS INTO THE LUNGS EVERY 4 HOURS AS NEEDED FOR SHORTNESS OF BREATH OR WHEEZING 18 g 1   azelastine (ASTELIN) 0.1 % nasal spray SPRAY  TWO SPRAYS INTO EACH NOSTRIL TWICE A DAY AS NEEDED FOR RHINITIS 30 mL 5   budesonide-formoterol (SYMBICORT) 160-4.5 MCG/ACT inhaler Inhale 2 puffs into the lungs in the morning and at bedtime. 1 each 5   cetirizine (ZYRTEC) 10 MG tablet Take 1 tablet (10 mg total) by mouth 2 (two) times daily as needed for allergies (Can take an extra dose if needed during flare ups.). 180 tablet 2   dexlansoprazole (DEXILANT) 60 MG capsule Take 1 capsule (60 mg total) by mouth daily. 90 capsule 2   diphenhydrAMINE (BENADRYL) 25 MG tablet Take 1 tablet (25 mg total) by mouth every 6 (six) hours as needed. 20 tablet 0   EPINEPHrine (EPIPEN 2-PAK) 0.3 mg/0.3 mL IJ SOAJ injection Inject 0.3 mg into the muscle as needed for anaphylaxis. 1 each 0   famotidine (PEPCID) 40 MG  tablet Take 1 tablet (40 mg total) by mouth 2 (two) times daily. 60 tablet 5   montelukast (SINGULAIR) 10 MG tablet Take 1 tablet (10 mg total) by mouth at bedtime. 90 tablet 1   Multiple Vitamins-Minerals (MULTIVITAMIN WOMEN 50+) TABS 1 tablet     potassium chloride (K-DUR) 10 MEQ tablet Take 1 tablet (10 mEq total) by mouth daily. 90 tablet 1   No current facility-administered medications for this visit.    Allergies as of 11/10/2021 - Review Complete 09/22/2021  Allergen Reaction Noted   Seasonal ic [cholestatin]  08/13/2015   Tramadol  11/21/2014   Amoxicillin Itching and Rash 07/30/2014   Levofloxacin Rash 10/16/2019   Tramadol hcl Rash 03/12/2021    Family History  Problem Relation Age of Onset   Diabetes Father    Diabetes Sister        x 2   Hypertension Sister        x 2   Diabetes Brother    Hypertension Brother    Colon cancer Neg Hx    Esophageal cancer Neg Hx    Pancreatic cancer Neg Hx    Rectal cancer Neg Hx    Stomach cancer Neg Hx    Colon polyps Neg Hx    Breast cancer Neg Hx     Social History   Socioeconomic History   Marital status: Married    Spouse name: Not on file   Number of children: 3   Years of education: Not on file   Highest education level: Not on file  Occupational History   Occupation: Retired    Fish farm manager: UNEMPLOYED  Tobacco Use   Smoking status: Former    Years: 10.00    Types: Cigarettes    Quit date: 09/07/1988    Years since quitting: 33.1   Smokeless tobacco: Never  Vaping Use   Vaping Use: Never used  Substance and Sexual Activity   Alcohol use: No    Alcohol/week: 0.0 standard drinks of alcohol   Drug use: No   Sexual activity: Yes    Birth control/protection: Surgical    Comment: hysterectomy  Other Topics Concern   Not on file  Social History Narrative   Not on file   Social Determinants of Health   Financial Resource Strain: Not on file  Food Insecurity: Not on file  Transportation Needs: Not on file   Physical Activity: Not on file  Stress: Not on file  Social Connections: Not on file  Intimate Partner Violence: Not on file    Review of Systems:    Constitutional: No weight loss, fever or chills Cardiovascular: No chest pain  Respiratory: No SOB  Gastrointestinal: See HPI and otherwise negative   Physical Exam:  Vital signs: BP 120/70 (BP Location: Left Arm, Patient Position: Sitting, Cuff Size: Normal)   Pulse 92   Ht 5' 3"  (1.6 m) Comment: height measured without shoes  Wt 179 lb 4 oz (81.3 kg)   BMI 31.75 kg/m    Constitutional:   Pleasant AA female appears to be in NAD, Well developed, Well nourished, alert and cooperative Respiratory: Respirations even and unlabored. Lungs clear to auscultation bilaterally.   No wheezes, crackles, or rhonchi.  Cardiovascular: Normal S1, S2. No MRG. Regular rate and rhythm. No peripheral edema, cyanosis or pallor.  Gastrointestinal:  Soft, nondistended, nontender. No rebound or guarding. Normal bowel sounds. No appreciable masses or hepatomegaly. Rectal:  External: Grade II internal hemorrhoid with stigmata of recent bleeding; Internal: no mass or tenderness Psychiatric: Demonstrates good judgement and reason without abnormal affect or behaviors.  RELEVANT LABS AND IMAGING: CBC    Component Value Date/Time   WBC 3.2 (L) 01/01/2021 0952   RBC 4.23 01/01/2021 0952   HGB 12.8 01/01/2021 0952   HCT 38.0 01/01/2021 0952   PLT 318.0 01/01/2021 0952   MCV 90.0 01/01/2021 0952   MCH 29.1 09/07/2012 0943   MCHC 33.6 01/01/2021 0952   RDW 13.1 01/01/2021 0952   LYMPHSABS 1.3 01/01/2021 0952   MONOABS 0.5 01/01/2021 0952   EOSABS 0.3 01/01/2021 0952   BASOSABS 0.1 01/01/2021 0952    CMP     Component Value Date/Time   NA 141 01/01/2021 0952   K 4.2 01/01/2021 0952   CL 106 01/01/2021 0952   CO2 25 01/01/2021 0952   GLUCOSE 89 01/01/2021 0952   BUN 12 01/01/2021 0952   CREATININE 0.76 01/01/2021 0952   CALCIUM 9.7 01/01/2021 0952    PROT 7.8 07/21/2021 1326   ALBUMIN 4.6 07/21/2021 1326   AST 24 07/21/2021 1326   ALT 30 07/21/2021 1326   ALKPHOS 91 07/21/2021 1326   BILITOT 0.7 07/21/2021 1326   GFRNONAA >90 04/04/2012 0540   GFRAA >90 04/04/2012 0540    Assessment: 1.  Bleeding internal hemorrhoid: Bled 3 days in May and again this morning, seen on exam today 2.  Crohn's disease: Well-controlled on Humira every other week  Plan: 1.  Prescribed Hydrocortisone ointment 2.5% to be applied to Preparation H suppository twice daily x7 days.  Patient can repeat this for an additional week if needed. 2.  Continue Humira for Crohn's 3.  Patient will be due for annual follow-up for Crohn's disease in 3 to 4 months.  Ellouise Newer, PA-C [facility] 11/10/2021, 1:48 PM  Cc: Cari Caraway, MD

## 2021-11-10 NOTE — Patient Instructions (Signed)
If you are age 66 or older, your body mass index should be between 23-30. Your Body mass index is 31.75 kg/m. If this is out of the aforementioned range listed, please consider follow up with your Primary Care Provider. ________________________________________________________  The [facility] providers would like to encourage you to use [facility] to communicate with providers for non-urgent requests or questions.  Due to long hold times on the telephone, sending your provider a message by [facility] may be a faster and more efficient way to get a response.  Please allow 48 business hours for a response.  Please remember that this is for non-urgent requests.  _______________________________________________________  START Hydrocortisone cream apply to OTC Preparation-H suppositories twice daily for 7 days, repeat if needed.  Follow up as needed.  Thank you for entrusting me with your care and choosing [facility].  Ellouise Newer, PA-C

## 2021-11-17 ENCOUNTER — Other Ambulatory Visit: Payer: Self-pay | Admitting: Nurse Practitioner

## 2021-11-17 DIAGNOSIS — Z1231 Encounter for screening mammogram for malignant neoplasm of breast: Secondary | ICD-10-CM

## 2021-11-20 ENCOUNTER — Telehealth: Payer: Self-pay | Admitting: Allergy & Immunology

## 2021-11-20 ENCOUNTER — Ambulatory Visit (INDEPENDENT_AMBULATORY_CARE_PROVIDER_SITE_OTHER): Payer: Medicare PPO | Admitting: *Deleted

## 2021-11-20 ENCOUNTER — Ambulatory Visit
Admission: RE | Admit: 2021-11-20 | Discharge: 2021-11-20 | Disposition: A | Discharge status: Home / Self Care | Source: Ambulatory Visit | Attending: Nurse Practitioner | Admitting: Nurse Practitioner

## 2021-11-20 DIAGNOSIS — Z1231 Encounter for screening mammogram for malignant neoplasm of breast: Secondary | ICD-10-CM

## 2021-11-20 DIAGNOSIS — J309 Allergic rhinitis, unspecified: Secondary | ICD-10-CM | POA: Diagnosis not present

## 2021-11-20 MED ORDER — FLUTICASONE-SALMETEROL 250-50 MCG/ACT IN AEPB: 1.0000 | INHALATION_SPRAY | Freq: Two times a day (BID) | RESPIRATORY_TRACT | 0 refills | Status: DC

## 2021-11-20 NOTE — Telephone Encounter (Signed)
I have never heard of that, but let's change her to Advair 250/50 one puff BID for one month to see if this helps.   Malachi Bonds, MD [facility]

## 2021-11-24 ENCOUNTER — Ambulatory Visit (INDEPENDENT_AMBULATORY_CARE_PROVIDER_SITE_OTHER): Payer: Medicare PPO

## 2021-11-24 DIAGNOSIS — J309 Allergic rhinitis, unspecified: Secondary | ICD-10-CM

## 2021-12-03 ENCOUNTER — Ambulatory Visit (INDEPENDENT_AMBULATORY_CARE_PROVIDER_SITE_OTHER): Payer: Medicare PPO

## 2021-12-03 DIAGNOSIS — J309 Allergic rhinitis, unspecified: Secondary | ICD-10-CM

## 2021-12-07 ENCOUNTER — Encounter: Payer: Self-pay | Admitting: Nurse Practitioner

## 2021-12-07 ENCOUNTER — Ambulatory Visit (INDEPENDENT_AMBULATORY_CARE_PROVIDER_SITE_OTHER): Payer: Medicare PPO | Admitting: Nurse Practitioner

## 2021-12-07 VITALS — BP 126/80 | Ht 64.0 in | Wt 180.0 lb

## 2021-12-07 DIAGNOSIS — M8589 Other specified disorders of bone density and structure, multiple sites: Secondary | ICD-10-CM

## 2021-12-07 DIAGNOSIS — Z01419 Encounter for gynecological examination (general) (routine) without abnormal findings: Secondary | ICD-10-CM

## 2021-12-07 DIAGNOSIS — Z780 Asymptomatic menopausal state: Secondary | ICD-10-CM

## 2021-12-07 NOTE — Progress Notes (Signed)
   Brianna Santana [birth year] [id number]   History:  66 y.o. MBF G3 P3 presents for breast and pelvic exam without GYN complaints. Postmenopausal - no HRT. S/P 2002 TAH for fibroids. Normal pap and mammogram history. Crohn's and GERD followed by GI. Primary care manages hypothyroidism, asthma.   Gynecologic History No LMP recorded. Patient has had a hysterectomy.   Contraception: status post hysterectomy Sexually active: Yes  Health maintenance Last Pap: 07/16/2011. Results were: Normal Last mammogram: 11/20/2021. Results were: Normal Last colonoscopy: 11/07/2018. Results were: inflammation consistent with Crohn's disease, repeat in 5 years Last Dexa: 12/16/2020. Results were: T-score -1.6 FRAX 3.9% / 0.4%  Past medical history, past surgical history, family history and social history were all reviewed and documented in the EPIC chart. Married. Daughter in [employer] in [place], daughter in [place], son local. 10 grandchildren.   ROS:  A ROS was performed and pertinent positives and negatives are included.  Exam:  Vitals:   12/07/21 1014  BP: 126/80  Weight: 180 lb (81.6 kg)  Height: 5' 4"  (1.626 m)    Body mass index is 30.9 kg/m.  General appearance:  Normal Thyroid:  Symmetrical, normal in size, without palpable masses or nodularity. Respiratory  Auscultation:  Clear without wheezing or rhonchi Cardiovascular  Auscultation:  Regular rate, without rubs, murmurs or gallops  Edema/varicosities:  Not grossly evident Abdominal  Soft,nontender, without masses, guarding or rebound.  Liver/spleen:  No organomegaly noted  Hernia:  None appreciated  Skin  Inspection:  Grossly normal   Breasts: Examined lying and sitting.   Right: Without masses, retractions, discharge or axillary adenopathy.   Left: Without masses, retractions, discharge or axillary adenopathy. Genitourinary   Inguinal/mons:  Normal without inguinal adenopathy  External genitalia:  Normal appearing  vulva with no masses, tenderness, or lesions  BUS/Urethra/Skene's glands:  Normal  Vagina:  Normal appearing with normal color and discharge, no lesions  Cervix:  and uterus absent  Adnexa/parametria:     Rt: Normal in size, without masses or tenderness.   Lt: Normal in size, without masses or tenderness.  Anus and perineum: Normal  Digital rectal exam: Normal sphincter tone without palpated masses or tenderness  Patient informed chaperone available to be present for breast and pelvic exam. Patient has requested no chaperone to be present. Patient has been advised what will be completed during breast and pelvic exam.   Assessment/Plan:  66 y.o. G3 P3 for breast and pelvic exam.   Well female exam with routine gynecological exam - Education provided on SBEs, importance of preventative screenings, current guidelines, high calcium diet, regular exercise, and multivitamin daily. Labs with PCP.  Osteopenia of multiple sites - T-score -1.6 without elevated FRAX.  FRAX 3.7% / 0.3% July 2022. Stable from 2020. Continue calcium and vitamin D supplement and regular exercise.   Postmenopausal - No HRT. S/P TAH for fibroids.   Screening for cervical cancer - Normal Pap history. No longer screening per guidelines.   Screening for breast cancer - Normal mammogram history.  Continue annual screenings. Normal breast exam today.  Screening for colon cancer - 2020 colonoscopy. Will repeat at GI's recommended interval.   Follow up in 2 years for breast and pelvic exam.       Brianna Santana [facility], 10:35 AM 12/07/2021

## 2021-12-24 ENCOUNTER — Encounter: Payer: Self-pay | Admitting: Allergy & Immunology

## 2021-12-24 ENCOUNTER — Ambulatory Visit (INDEPENDENT_AMBULATORY_CARE_PROVIDER_SITE_OTHER): Payer: Medicare PPO | Admitting: Allergy & Immunology

## 2021-12-24 ENCOUNTER — Ambulatory Visit (INDEPENDENT_AMBULATORY_CARE_PROVIDER_SITE_OTHER): Payer: Medicare PPO

## 2021-12-24 VITALS — BP 126/70 | HR 90 | Temp 98.0°F | Resp 18 | Ht 64.0 in | Wt 181.1 lb

## 2021-12-24 DIAGNOSIS — J3089 Other allergic rhinitis: Secondary | ICD-10-CM

## 2021-12-24 DIAGNOSIS — J309 Allergic rhinitis, unspecified: Secondary | ICD-10-CM

## 2021-12-24 DIAGNOSIS — K219 Gastro-esophageal reflux disease without esophagitis: Secondary | ICD-10-CM

## 2021-12-24 DIAGNOSIS — J4540 Moderate persistent asthma, uncomplicated: Secondary | ICD-10-CM | POA: Diagnosis not present

## 2021-12-24 MED ORDER — MONTELUKAST SODIUM 10 MG PO TABS: 10.0000 mg | ORAL_TABLET | Freq: Every day | ORAL | 1 refills | Status: DC

## 2021-12-24 MED ORDER — ALBUTEROL SULFATE HFA 108 (90 BASE) MCG/ACT IN AERS: INHALATION_SPRAY | RESPIRATORY_TRACT | 1 refills | Status: DC

## 2021-12-24 MED ORDER — FLUTICASONE-SALMETEROL 250-50 MCG/ACT IN AEPB: 1.0000 | INHALATION_SPRAY | Freq: Two times a day (BID) | RESPIRATORY_TRACT | 1 refills | Status: DC

## 2021-12-24 NOTE — Addendum Note (Signed)
Addended by: Jacqualin Combes on: 12/24/2021 04:05 PM   Modules accepted: Orders

## 2021-12-24 NOTE — Patient Instructions (Addendum)
1. Moderate persistent asthma, uncomplicated - Lung testing looks great today.  - Daily controller medication(s): Singulair 61m daily + Advair 250/50 one puff twice daily   - Rescue medications: albuterol 4 puffs every 4-6 hours as needed  - Asthma control goals:  * Full participation in all desired activities (may need albuterol before activity) * Albuterol use two time or less a week on average (not counting use with activity) * Cough interfering with sleep two time or less a month * Oral steroids no more than once a year * No hospitalizations  2. Chronic perennial rhinitis (cockroach, dust mite, molds) - Continue with allergy shots at the same schedule.  - Continue with all of your nose sprays on an as-needed basis. - Anticipate five years of allergen immunotherapy (September 2024).   3. GERD - Continue with Dexilant 60 mg daily.  - Continue with Pepcid 487mtwice daily as well.   4. Return in about 6 months (around 06/26/2022).    Please inform usKoreaf any Emergency Department visits, hospitalizations, or changes in symptoms. Call usKoreaefore going to the ED for breathing or allergy symptoms since we might be able to fit you in for a sick visit. Feel free to contact usKoreanytime with any questions, problems, or concerns.  It was a pleasure to see you again today!  Check out the Participatory Budgeting and see if you can submit a project: [email or web address]   Websites that have reliable patient information: 1. American Academy of Asthma, Allergy, and Immunology: www.aaaai.org 2. Food Allergy Research and Education (FARE): foodallergy.org 3. Mothers of Asthmatics: http://www.asthmacommunitynetwork.org 4. American College of Allergy, Asthma, and Immunology: www.acaai.org   COVID-19 Vaccine Information can be found at: [email or web address] questions  related to vaccine distribution or appointments, please email [email or web address] or call [phone]  We realize that you might be concerned about having an allergic reaction to the COVID19 vaccines. To help with that concern, WE ARE OFFERING THE COVID19 VACCINES IN OUR OFFICE! Ask the front desk for dates!     "Like" usKorean Facebook and Instagram for our latest updates!      A healthy democracy works best when ALNew York Life Insurancearticipate! Make sure you are registered to vote! If you have moved or changed any of your contact information, you will need to get this updated before voting!  In some cases, you MAY be able to register to vote online: [email or web address]

## 2021-12-24 NOTE — Progress Notes (Signed)
FOLLOW UP  Date of Service/Encounter:  12/24/21   Assessment:   Moderate persistent asthma without complication - with continued night time cough    Perennial allergic rhinitis  (cockroach, molds, dust mites) - on allergen immunotherapy with maintenance reached in September 2019   GERD - changing PPI and adding H2 blocker today   Vaccinated to [place] - with two boosters  Plan/Recommendations:   1. Moderate persistent asthma, uncomplicated - Lung testing looks great today.  - Daily controller medication(s): Singulair 71m daily + Advair 250/50 one puff twice daily   - Rescue medications: albuterol 4 puffs every 4-6 hours as needed  - Asthma control goals:  * Full participation in all desired activities (may need albuterol before activity) * Albuterol use two time or less a week on average (not counting use with activity) * Cough interfering with sleep two time or less a month * Oral steroids no more than once a year * No hospitalizations  2. Chronic perennial rhinitis (cockroach, dust mite, molds) - Continue with allergy shots at the same schedule.  - Continue with all of your nose sprays on an as-needed basis. - Anticipate five years of allergen immunotherapy (September 2024).   3. GERD - Continue with Dexilant 60 mg daily.  - Continue with Pepcid 434mtwice daily as well.   4. Return in about 6 months (around 06/26/2022).   Subjective:   DoElynor Kallenbergers a [age].o. female presenting today for follow up of  Chief Complaint  Patient presents with   Asthma    DoZyionna Pesceas a history of the following: Patient Active Problem List   Diagnosis Date Noted   Trigger thumb, left thumb 03/26/2020   Stenosing tenosynovitis of wrist 03/26/2020   Perennial allergic rhinitis 12/20/2016   Cough 05/10/2016   Acute left-sided thoracic back pain 04/05/2016   Routine general medical examination at a health care facility 05/02/2015   Thyroid mass 01/16/2015    Drug allergy 07/30/2014   [place]   Osteopenia 09/11/2013   Hyperthyroidism    GERD 11/02/2007   Moderate persistent asthma without complication 0659/29/2446  History obtained from: chart review and patient.  DoLaurenas a [age].o. female presenting for a follow up visit.  She was last seen in April 2023.  At that time, her lung testing looks great.  We did recommend doing an air purifier in her bedroom.  We continue with Singulair 10 mg daily as well as Symbicort 160 mcg 2 puffs twice daily.  For her allergic rhinitis, we continue with allergy shots and all of her nose sprays.  She was using them as needed.  For her reflux, we stopped omeprazole and started Dexilant.  We added on Pepcid 40 mg twice daily.  In the interim, she called reporting cramping in her legs that she felt was related to Symbicort.  We changed her to Advair 250/50 1 puff twice daily to see if that helped.  Since last visit, she has done well.   Asthma/Respiratory Symptom History: She is doing better with the Advair one puff twice daily. She is doing better with her leg cramping.  She is not having any SOB or wheezing or coughing. She has been coughing with exposure to the outdoors at night. She has been inhaling the CaBucksDorothy's asthma has been well controlled. She has not required rescue medication, experienced nocturnal awakenings due to lower respiratory symptoms, nor have activities of daily living  been limited. She has required no Emergency Department or Urgent Care visits for her asthma. She has required zero courses of systemic steroids for asthma exacerbations since the last visit. ACT score today is 18, indicating excellent asthma symptom control.   Allergic Rhinitis Symptom History: Allergy shots are going well.  She feels that they are helping with her symptoms. She has not needed to any antibiotics at all since the last visit. She does not use the nose sprays on a routine  basis.  GERD Symptom History: She remains on the Dexilant daily and this is working well. She remains on the Pepcid 60m BID. She is very happy with how well she is doing.   She had bone density testing that looked excellent. She is up to date on her colonoscopies.   She is frustrated today about some speeding issues in her neighborhood.  She tells me that people drive through there like the highway.  They have tried to bring this up to the city council, including her council woman [name] but they refused to put in any speed bumps or to be monitoring equipment.  She is really frustrated about this.  Otherwise, there have been no changes to her past medical history, surgical history, family history, or social history.    Review of Systems  Constitutional: Negative.  Negative for chills, fever, malaise/fatigue and weight loss.  HENT: Negative.  Negative for congestion, ear discharge, ear pain and sinus pain.   Eyes:  Negative for pain, discharge and redness.  Respiratory:  Negative for cough, sputum production, shortness of breath and wheezing.   Cardiovascular: Negative.  Negative for chest pain and palpitations.  Gastrointestinal:  Negative for abdominal pain, constipation, diarrhea, heartburn, nausea and vomiting.  Skin: Negative.  Negative for itching and rash.  Neurological:  Negative for dizziness and headaches.  Endo/Heme/Allergies:  Negative for environmental allergies. Does not bruise/bleed easily.       Objective:   Blood pressure 126/70, pulse 90, temperature 98 F (36.7 C), resp. rate 18, height 5' 4"  (1.626 m), weight 181 lb 2 oz (82.2 kg), SpO2 98 %. Body mass index is 31.09 kg/m.    Physical Exam Constitutional:      Appearance: Normal appearance. She is well-developed.     Comments: Lovely and talkative.  HENT:     Head: Normocephalic and atraumatic.     Right Ear: Tympanic membrane, ear canal and external ear normal.     Left Ear: Tympanic membrane,  ear canal and external ear normal.     Nose: No nasal deformity, septal deviation, mucosal edema or rhinorrhea.     Right Turbinates: Enlarged, swollen and pale.     Left Turbinates: Enlarged, swollen and pale.     Right Sinus: No maxillary sinus tenderness or frontal sinus tenderness.     Left Sinus: No maxillary sinus tenderness or frontal sinus tenderness.     Mouth/Throat:     Mouth: Mucous membranes are not pale and not dry.     Pharynx: Uvula midline.     Comments: No cobblestoning noted.  Eyes:     General: Lids are normal. Allergic shiner present.        Right eye: No discharge.        Left eye: No discharge.     Conjunctiva/sclera: Conjunctivae normal.     Right eye: Right conjunctiva is not injected. No chemosis.    Left eye: Left conjunctiva is not injected. No chemosis.    Pupils: Pupils  are equal, round, and reactive to light.  Cardiovascular:     Rate and Rhythm: Normal rate and regular rhythm.     Heart sounds: Normal heart sounds.  Pulmonary:     Effort: Pulmonary effort is normal. No tachypnea, accessory muscle usage or respiratory distress.     Breath sounds: Normal breath sounds. No wheezing, rhonchi or rales.     Comments: Moving air well in all lung fields.  Chest:     Chest wall: No tenderness.  Lymphadenopathy:     Cervical: No cervical adenopathy.  Skin:    Coloration: Skin is not pale.     Findings: No abrasion, erythema, petechiae or rash. Rash is not papular, urticarial or vesicular.  Neurological:     Mental Status: She is alert.  Psychiatric:        Behavior: Behavior is cooperative.      Diagnostic studies:    Spirometry: results normal (FEV1: 1.75/88%, FVC: 2.28/90%, FEV1/FVC: 77%).    Spirometry consistent with normal pattern.    Allergy Studies: none        Salvatore Marvel, MD  [facility]

## 2021-12-28 ENCOUNTER — Telehealth: Payer: Self-pay | Admitting: Gastroenterology

## 2021-12-28 ENCOUNTER — Other Ambulatory Visit: Payer: Self-pay

## 2021-12-28 DIAGNOSIS — K5010 Crohn's disease of large intestine without complications: Secondary | ICD-10-CM

## 2021-12-28 MED ORDER — HUMIRA (2 PEN) 40 MG/0.4ML ~~LOC~~ AJKT: 40.0000 mg | AUTO-INJECTOR | SUBCUTANEOUS | 1 refills | Status: DC

## 2021-12-28 NOTE — Telephone Encounter (Signed)
OK to refill for 6 months

## 2021-12-28 NOTE — Telephone Encounter (Signed)
Refill sent in. Left detailed message for patient.

## 2022-02-04 IMAGING — MG MM DIGITAL SCREENING BILAT W/ TOMO AND CAD
8 series · 8 of 24 positions shown · non-contrast
Comparison: Previous exam(s).

CLINICAL DATA: Screening.

EXAM:
DIGITAL SCREENING BILATERAL MAMMOGRAM WITH TOMOSYNTHESIS AND CAD
TECHNIQUE: Bilateral screening digital craniocaudal and mediolateral oblique
mammograms were obtained. Bilateral screening digital breast
tomosynthesis was performed. The images were evaluated with
computer-aided detection.

[L MLO synth-2D]
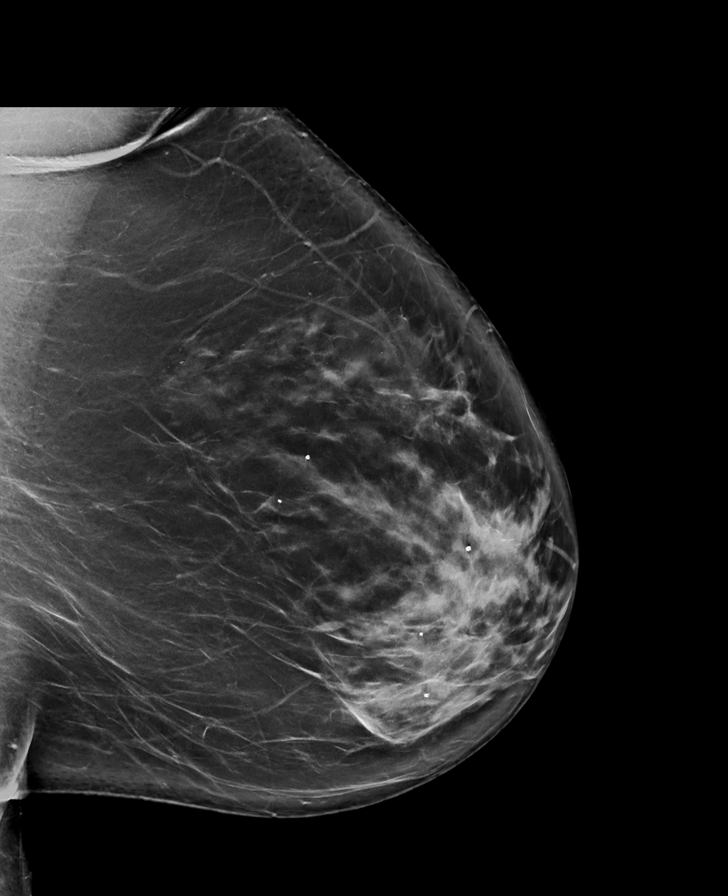

[R MLO synth-2D]
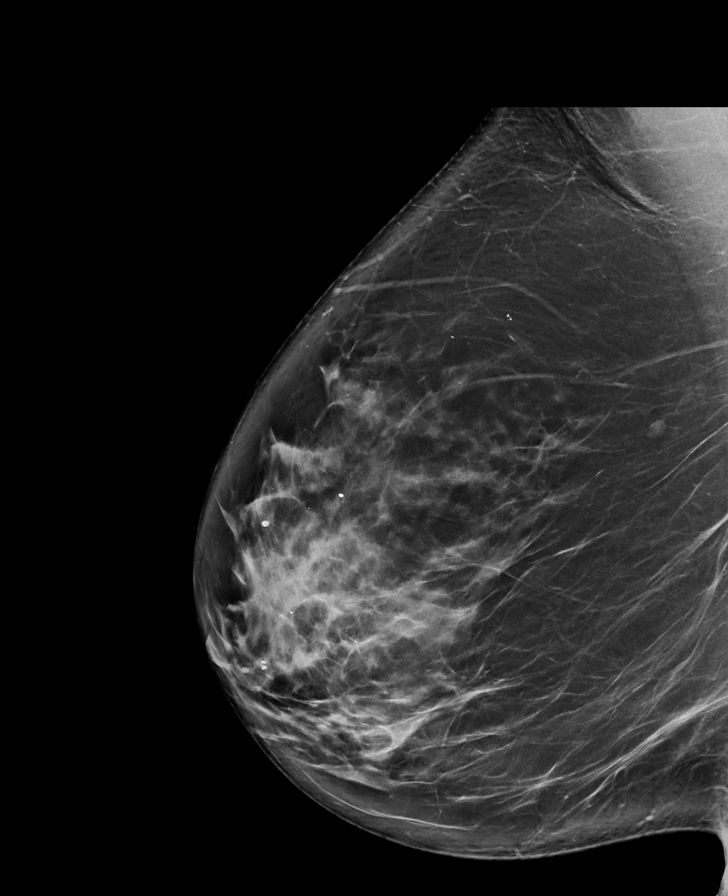

[L CC synth-2D]
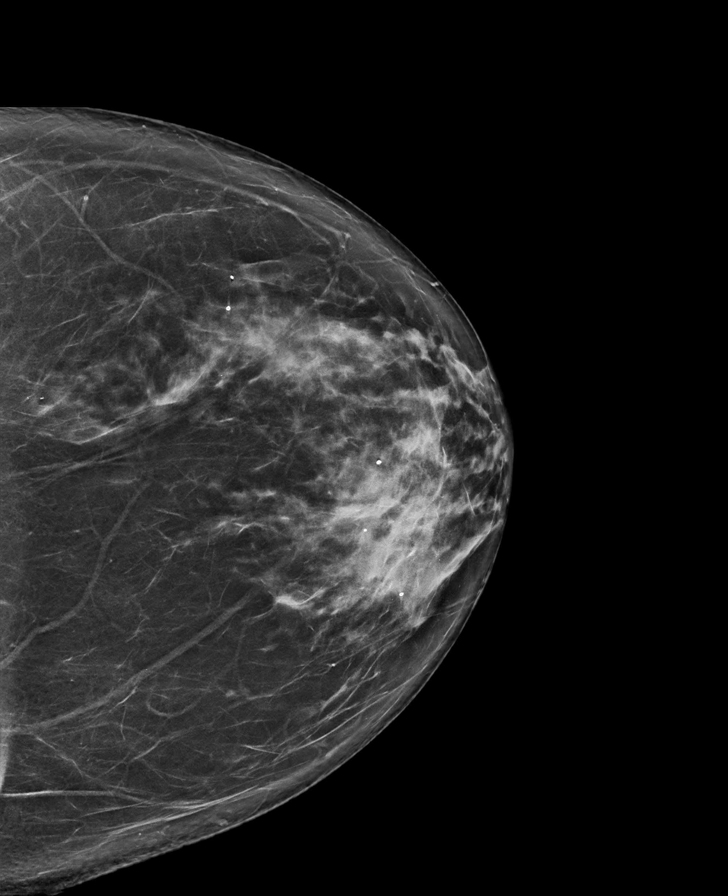

[R CC synth-2D]
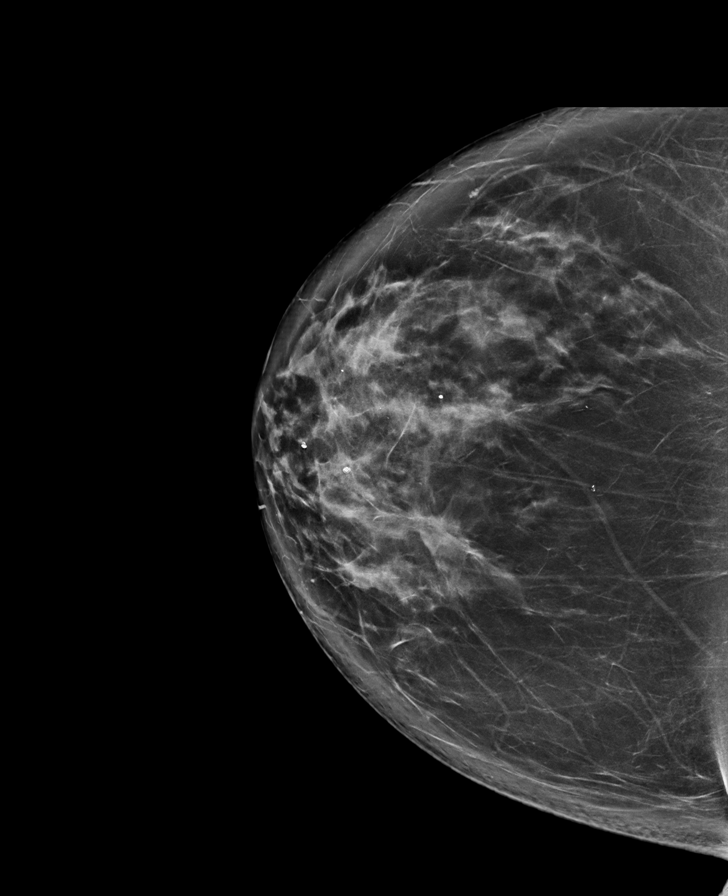

[R CC tomo · tomo slice 49/97.0]
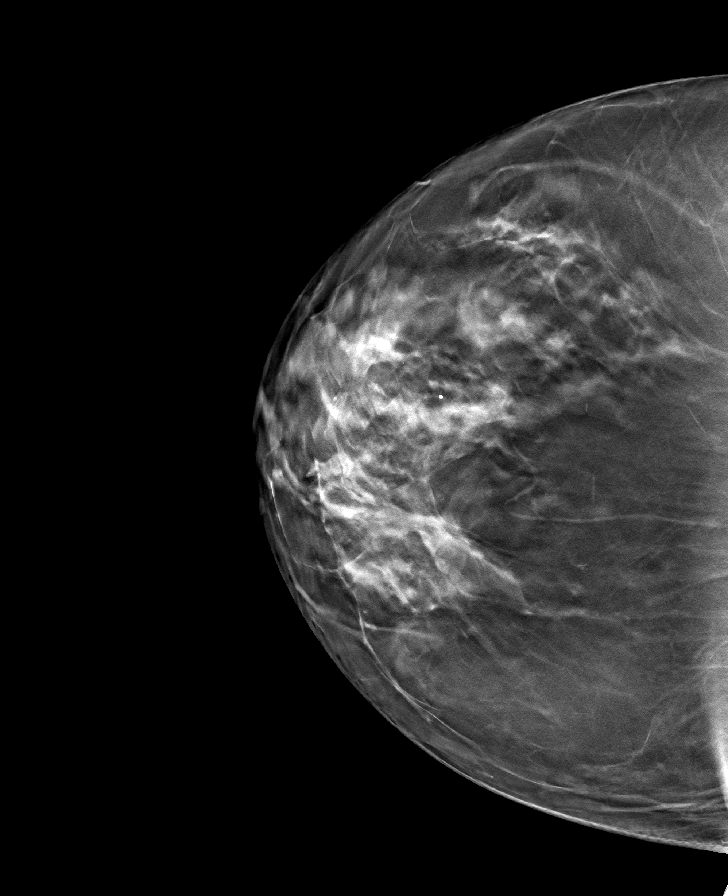

[L CC tomo · tomo slice 45/89.0]
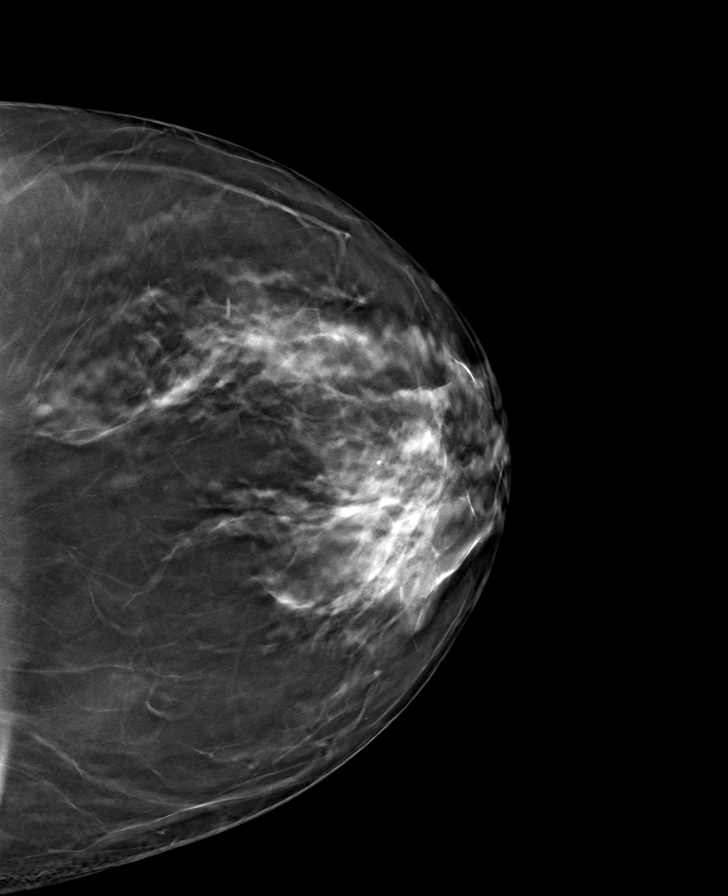

[R MLO tomo · tomo slice 50/99.0]
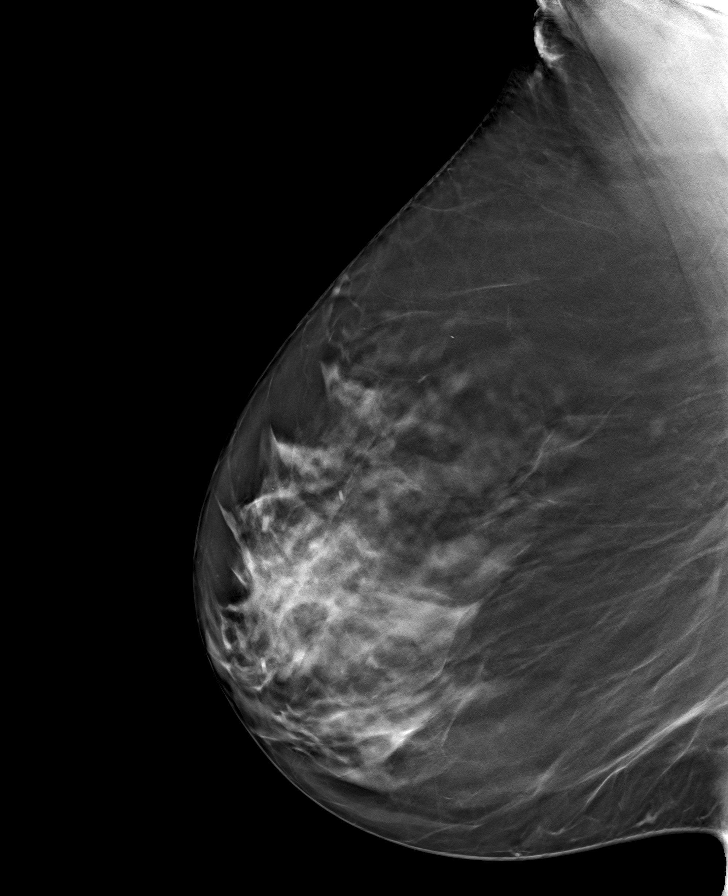

[L MLO tomo · tomo slice 50/99.0]
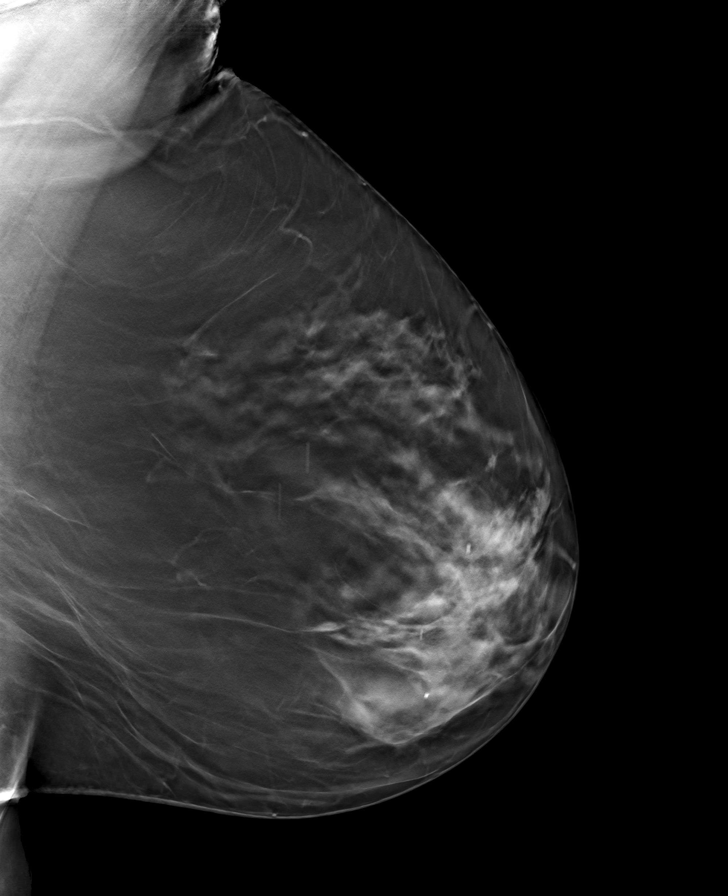

[8 of 24 positions shown; findings below may reference images not displayed]

ACR Breast Density Category b: There are scattered areas of
fibroglandular density.
FINDINGS: There are no findings suspicious for malignancy.
IMPRESSION: No mammographic evidence of malignancy. A result letter of this
screening mammogram will be mailed directly to the patient.

RECOMMENDATION:
Screening mammogram in one year. (Code:51-O-LD2)

BI-RADS CATEGORY  1: Negative.

## 2022-02-09 ENCOUNTER — Ambulatory Visit (INDEPENDENT_AMBULATORY_CARE_PROVIDER_SITE_OTHER): Payer: Medicare PPO | Admitting: *Deleted

## 2022-02-09 DIAGNOSIS — J309 Allergic rhinitis, unspecified: Secondary | ICD-10-CM

## 2022-02-15 DIAGNOSIS — H524 Presbyopia: Secondary | ICD-10-CM | POA: Diagnosis not present

## 2022-02-15 DIAGNOSIS — H04123 Dry eye syndrome of bilateral lacrimal glands: Secondary | ICD-10-CM | POA: Diagnosis not present

## 2022-02-15 DIAGNOSIS — H40023 Open angle with borderline findings, high risk, bilateral: Secondary | ICD-10-CM | POA: Diagnosis not present

## 2022-02-15 DIAGNOSIS — H25813 Combined forms of age-related cataract, bilateral: Secondary | ICD-10-CM | POA: Diagnosis not present

## 2022-02-15 DIAGNOSIS — H43813 Vitreous degeneration, bilateral: Secondary | ICD-10-CM | POA: Diagnosis not present

## 2022-02-16 ENCOUNTER — Ambulatory Visit (INDEPENDENT_AMBULATORY_CARE_PROVIDER_SITE_OTHER): Payer: Medicare PPO

## 2022-02-16 DIAGNOSIS — J309 Allergic rhinitis, unspecified: Secondary | ICD-10-CM

## 2022-02-20 ENCOUNTER — Ambulatory Visit
Admission: EM | Admit: 2022-02-20 | Discharge: 2022-02-20 | Disposition: A | Discharge status: Home / Self Care | Payer: Medicare PPO | Attending: Physician Assistant | Admitting: Physician Assistant

## 2022-02-20 DIAGNOSIS — J069 Acute upper respiratory infection, unspecified: Secondary | ICD-10-CM | POA: Diagnosis not present

## 2022-02-20 DIAGNOSIS — Z1152 Encounter for screening for COVID-19: Secondary | ICD-10-CM | POA: Diagnosis not present

## 2022-02-20 NOTE — ED Triage Notes (Signed)
Pt presents to [facility] with co of cold, cough, congestion for 3 days. Pt has been taking robitussin for symptoms with minimal relief

## 2022-02-20 NOTE — ED Provider Notes (Signed)
[facility]    CSN: [id number] Arrival date & time: 02/20/22  1026      History   Chief Complaint Chief Complaint  Patient presents with   Cough    HPI Brianna Santana is a 66 y.o. female.   Patient here today for evaluation of nasal congestion, cough and sore throat that she has had the last 3 days.  She reports that she has had fever as well.  She has been taking Robitussin without significant relief.  She does endorse body aches.  She denies any vomiting or diarrhea.  The history is provided by the patient.  Cough Associated symptoms: chills, fever (subjective), myalgias and sore throat   Associated symptoms: no ear pain, no eye discharge, no shortness of breath and no wheezing     Past Medical History:  Diagnosis Date   Arthritis    Asthma    C. difficile colitis 2011 and 07/2010   Cervical dysplasia    Crohn disease ([place])    Crohn's disease ([place])    Hyperplastic polyp of intestine 09/2007   Osteopenia 11/2018   T score -2.0 FRAX 3.7% / 0.3%   Thyroid disease    Urinary incontinence     Patient Active Problem List   Diagnosis Date Noted   Trigger thumb, left thumb 03/26/2020   Stenosing tenosynovitis of wrist 03/26/2020   Perennial allergic rhinitis 12/20/2016   Cough 05/10/2016   Acute left-sided thoracic back pain 04/05/2016   Routine general medical examination at a health care facility 05/02/2015   Thyroid mass 01/16/2015   Drug allergy 07/30/2014   Crohn's disease ([place]) 03/18/2014   Osteopenia 09/11/2013   Hyperthyroidism    GERD 11/02/2007   Moderate persistent asthma without complication 36/14/4315    Past Surgical History:  Procedure Laterality Date   BREAST CYST ASPIRATION Right    2000   BREAST EXCISIONAL BIOPSY Right    1980's   COLONOSCOPY  09/18/2013   COLPOSCOPY     FLEXIBLE SIGMOIDOSCOPY  03/29/2012   Procedure: FLEXIBLE SIGMOIDOSCOPY;  Surgeon: Ladene Artist, MD,FACG;  Location: [facility];  Service:  Endoscopy;  Laterality: N/A;   GANGLION CYST EXCISION Left    POLYPECTOMY     TONSILLECTOMY     TOTAL ABDOMINAL HYSTERECTOMY  2000   TRIGGER FINGER RELEASE     WRIST SURGERY Left 2021    OB History     Gravida  3   Para  3   Term  3   Preterm      AB      Living  3      SAB      IAB      Ectopic      Multiple      Live Births               Home Medications    Prior to Admission medications   Medication Sig Start Date End Date Taking? Authorizing Provider  Adalimumab (HUMIRA PEN) 40 MG/0.4ML PNKT Inject 40 mg into the skin every 14 (fourteen) days. 12/28/21   Ladene Artist, MD  albuterol [facility] HFA) [phone]) MCG/ACT inhaler INHALE 4 PUFFS INTO THE LUNGS EVERY 4 HOURS AS NEEDED FOR SHORTNESS OF BREATH OR WHEEZING 12/24/21   Valentina Shaggy, MD  azelastine (ASTELIN) 0.1 % nasal spray SPRAY TWO SPRAYS INTO EACH NOSTRIL TWICE A DAY AS NEEDED FOR RHINITIS 06/18/21   Valentina Shaggy, MD  budesonide-formoterol [facility]) 160-4.5 MCG/ACT inhaler  Inhale 2 puffs into the lungs in the morning and at bedtime. Patient not taking: Reported on 12/24/2021 09/22/21 12/24/21  Valentina Shaggy, MD  CALCIUM-MAGNESIUM-VITAMIN D PO Take 2 tablets by mouth daily. With Zinc    [provider]  cetirizine (ZYRTEC) 10 MG tablet Take 1 tablet (10 mg total) by mouth 2 (two) times daily as needed for allergies (Can take an extra dose if needed during flare ups.). 06/11/21 12/24/21  Valentina Shaggy, MD  dexlansoprazole (DEXILANT) 60 MG capsule Take 1 capsule (60 mg total) by mouth daily. 10/09/21   Valentina Shaggy, MD  diphenhydrAMINE (BENADRYL) 25 MG tablet Take 1 tablet (25 mg total) by mouth every 6 (six) hours as needed. 10/16/19   Nuala Alpha A, PA-C  EPINEPHrine (EPIPEN 2-PAK) 0.3 mg/0.3 mL IJ SOAJ injection Inject 0.3 mg into the muscle as needed for anaphylaxis. 06/11/21   Valentina Shaggy, MD  famotidine (PEPCID) 40 MG tablet Take 1  tablet (40 mg total) by mouth 2 (two) times daily. 09/22/21   Valentina Shaggy, MD  famotidine (PEPCID) 40 MG tablet 1 tablet at bedtime.    [provider]  fluticasone-salmeterol (ADVAIR) 250-50 MCG/ACT AEPB Inhale 1 puff into the lungs in the morning and at bedtime. 12/24/21   Valentina Shaggy, MD  montelukast (SINGULAIR) 10 MG tablet Take 1 tablet (10 mg total) by mouth at bedtime. 12/24/21   Valentina Shaggy, MD  Multiple Vitamins-Minerals (MULTIVITAMIN WOMEN 50+) TABS 1 tablet    [provider]  potassium chloride (K-DUR) 10 MEQ tablet Take 1 tablet (10 mEq total) by mouth daily. 11/21/18   Ladene Artist, MD    Family History Family History  Problem Relation Age of Onset   Diabetes Father    Diabetes Sister        x 2   Hypertension Sister        x 2   Diabetes Brother    Hypertension Brother    Colon cancer Neg Hx    Esophageal cancer Neg Hx    Pancreatic cancer Neg Hx    Rectal cancer Neg Hx    Stomach cancer Neg Hx    Colon polyps Neg Hx    Breast cancer Neg Hx     Social History Social History   Tobacco Use   Smoking status: Former    Years: 10.00    Types: Cigarettes    Quit date: 09/07/1988    Years since quitting: 33.4   Smokeless tobacco: Never  Vaping Use   Vaping Use: Never used  Substance Use Topics   Alcohol use: No    Alcohol/week: 0.0 standard drinks of alcohol   Drug use: No     Allergies   Seasonal ic [cholestatin], Tramadol, Amoxicillin, Levofloxacin, and Tramadol hcl   Review of Systems Review of Systems  Constitutional:  Positive for chills and fever (subjective).  HENT:  Positive for congestion and sore throat. Negative for ear pain.   Eyes:  Negative for discharge and redness.  Respiratory:  Positive for cough. Negative for shortness of breath and wheezing.   Gastrointestinal:  Negative for abdominal pain, diarrhea, nausea and vomiting.  Musculoskeletal:  Positive for myalgias.     Physical  Exam Triage Vital Signs ED Triage Vitals [02/20/22 1107]  Enc Vitals Group     BP (!) 162/88     Pulse Rate (!) 104     Resp 18     Temp 99.3 F (37.4 C)  Temp src      SpO2 98 %     Weight      Height      Head Circumference      Peak Flow      Pain Score      Pain Loc      Pain Edu?      Excl. in [place]?    No data found.  Updated Vital Signs BP (!) 162/88   Pulse (!) 104   Temp 99.3 F (37.4 C)   Resp 18   SpO2 98%      Physical Exam Vitals and nursing note reviewed.  Constitutional:      General: She is not in acute distress.    Appearance: Normal appearance. She is not ill-appearing.  HENT:     Head: Normocephalic and atraumatic.     Nose: Congestion present.     Mouth/Throat:     Mouth: Mucous membranes are moist.     Pharynx: No oropharyngeal exudate or posterior oropharyngeal erythema.  Eyes:     Conjunctiva/sclera: Conjunctivae normal.  Cardiovascular:     Rate and Rhythm: Normal rate and regular rhythm.     Heart sounds: Normal heart sounds. No murmur heard. Pulmonary:     Effort: Pulmonary effort is normal. No respiratory distress.     Breath sounds: Normal breath sounds. No wheezing, rhonchi or rales.  Skin:    General: Skin is warm and dry.  Neurological:     Mental Status: She is alert.  Psychiatric:        Mood and Affect: Mood normal.        Thought Content: Thought content normal.      UC Treatments / Results  Labs (all labs ordered are listed, but only abnormal results are displayed) Labs Reviewed  RESP PANEL BY RT-PCR (FLU A&B, COVID) [id number]  COMPREHENSIVE METABOLIC PANEL    EKG   Radiology No results found.  Procedures Procedures (including critical care time)  Medications Ordered in UC Medications - No data to display  Initial Impression / Assessment and Plan / UC Course  I have reviewed the triage vital signs and the nursing notes.  Pertinent labs & imaging results that were available during my care of the  patient were reviewed by me and considered in my medical decision making (see chart for details).    Suspect viral etiology of symptoms.  Will screen for COVID and flu.  Will await results for further recommendation.  CMP ordered today in the event that patient is COVID-positive, would be seemingly good candidate for Paxlovid, need recent GFR for appropriate dosing.   Final Clinical Impressions(s) / UC Diagnoses   Final diagnoses:  Acute upper respiratory infection  Encounter for screening for COVID-19   Discharge Instructions   None    ED Prescriptions   None    PDMP not reviewed this encounter.   Francene Finders, PA-C 02/20/22 1223

## 2022-02-21 LAB — RESP PANEL BY RT-PCR (FLU A&B, COVID) ARPGX2
Influenza A by PCR: NEGATIVE
Influenza B by PCR: NEGATIVE
SARS Coronavirus 2 by RT PCR: POSITIVE — AB

## 2022-02-23 LAB — COMPREHENSIVE METABOLIC PANEL
ALT: 28 IU/L (ref 0–32)
AST: 22 IU/L (ref 0–40)
Albumin/Globulin Ratio: 1.8 (ref 1.2–2.2)
Albumin: 4.9 g/dL (ref 3.9–4.9)
Alkaline Phosphatase: 104 IU/L (ref 44–121)
BUN/Creatinine Ratio: 14 (ref 12–28)
BUN: 12 mg/dL (ref 8–27)
Bilirubin Total: 0.7 mg/dL (ref 0.0–1.2)
CO2: 23 mmol/L (ref 20–29)
Calcium: 9.9 mg/dL (ref 8.7–10.3)
Chloride: 99 mmol/L (ref 96–106)
Creatinine, Ser: 0.86 mg/dL (ref 0.57–1.00)
Globulin, Total: 2.8 g/dL (ref 1.5–4.5)
Glucose: 82 mg/dL (ref 70–99)
Potassium: 5 mmol/L (ref 3.5–5.2)
Sodium: 139 mmol/L (ref 134–144)
Total Protein: 7.7 g/dL (ref 6.0–8.5)
eGFR: 74 mL/min/{1.73_m2} (ref >59)

## 2022-02-24 ENCOUNTER — Encounter: Payer: Self-pay | Admitting: Allergy

## 2022-02-24 ENCOUNTER — Ambulatory Visit (INDEPENDENT_AMBULATORY_CARE_PROVIDER_SITE_OTHER): Payer: Medicare PPO | Admitting: Allergy

## 2022-02-24 VITALS — BP 132/90 | HR 95 | Temp 98.2°F | Resp 18 | Ht 64.0 in | Wt 181.6 lb

## 2022-02-24 DIAGNOSIS — U071 COVID-19: Secondary | ICD-10-CM | POA: Diagnosis not present

## 2022-02-24 DIAGNOSIS — K219 Gastro-esophageal reflux disease without esophagitis: Secondary | ICD-10-CM

## 2022-02-24 DIAGNOSIS — J4541 Moderate persistent asthma with (acute) exacerbation: Secondary | ICD-10-CM | POA: Diagnosis not present

## 2022-02-24 DIAGNOSIS — J3089 Other allergic rhinitis: Secondary | ICD-10-CM

## 2022-02-24 MED ORDER — BENZONATATE 100 MG PO CAPS: 100.0000 mg | ORAL_CAPSULE | Freq: Three times a day (TID) | ORAL | 0 refills | Status: DC | PRN

## 2022-02-24 MED ORDER — PAXLOVID (300/100) 20 X 150 MG & 10 X 100MG PO TBPK: 3.0000 | ORAL_TABLET | Freq: Two times a day (BID) | ORAL | 0 refills | Status: DC

## 2022-02-24 NOTE — Patient Instructions (Addendum)
Covid-19 infections Take Paxlovid as prescribed. Letter written. Please be aware and limit exposures to other people - preferably wear a mask. See handouts. May take tessalon perles as needed for coughing.  If you get fevers again or have discolored mucous or have trouble breathing go to the urgent care or ER if after hours or over the weekend. During normal business hours you may call our office.   Asthma  Prednisone 90m tablet pack: 2 tablets given in office today. Take 2 more tablets before bed today.  Then take 2 tablets twice a day for 2 more days. Then take 2 tablets once a day for 1 day. Then take 1 tablet once a day for 1 day.   - Daily controller medication(s): Singulair 123mdaily + Advair 250/50 one puff twice daily   - Rescue medications: albuterol 4 puffs every 4-6 hours as needed  - Asthma control goals:  * Full participation in all desired activities (may need albuterol before activity) * Albuterol use two time or less a week on average (not counting use with activity) * Cough interfering with sleep two time or less a month * Oral steroids no more than once a year * No hospitalizations  2. Chronic perennial rhinitis (cockroach, dust mite, molds) - Continue with allergy shots - skip this week and next week.  - Continue with all of your nose sprays on an as-needed basis. - Anticipate five years of allergen immunotherapy (September 2024).   3. GERD - Continue with Dexilant 60 mg daily.  - Continue with Pepcid 4042mwice daily as well.   Follow up with Dr. GalErnst Bowler scheduled.   Drink plenty of fluids. Water, juice, clear broth or warm lemon water are good choices. Avoid caffeine and alcohol, which can dehydrate you. Eat chicken soup. Chicken soup and other warm fluids can be soothing and loosen congestion. Rest. Adjust your room's temperature and humidity. Keep your room warm but not overheated. If the air is dry, a cool-mist humidifier or vaporizer can  moisten the air and help ease congestion and coughing. Keep the humidifier clean to prevent the growth of bacteria and molds. Soothe your throat. Perform a saltwater gargle. Dissolve one-quarter to a half teaspoon of salt in a 4- to 8-ounce glass of warm water. This can relieve a sore or scratchy throat temporarily. Use saline nasal drops. To help relieve nasal congestion, try saline nasal drops. You can buy these drops over the counter, and they can help relieve symptoms ? even in children. Take over-the-counter cold and cough medications. For adults and children older than 5, over-the-counter decongestants, antihistamines and pain relievers might offer some symptom relief. However, they won't prevent a cold or shorten its duration.

## 2022-02-24 NOTE — Progress Notes (Unsigned)
Follow Up Note  RE: Brianna Santana MRN: [id number] DOB: June 14, 1955 Date of Office Visit: 02/24/2022  Referring provider: Cari Caraway, MD Primary care provider: Cari Caraway, MD  Chief Complaint: Cough (Sinus press; Headache; nasal congestion - clear x 6 dys  Covid-19 +)  History of Present Illness: I had the pleasure of seeing Brianna Santana for a follow up visit at the [facility] [place] on 02/25/2022. She is a 66 y.o. female, who is being followed for asthma, allergic rhinitis, GERD. Her previous allergy office visit was on 12/24/2021 with Dr. Ernst Bowler. Today is a new complaint visit of coughing .  Asthma/URI Patient has been coughing with clear phlegm last week on Thursday.   She can't sleep due to the coughing. She also had some fevers and went to UC on 9/22.    She took some OTC syrup with no benefit.  She has been taking robitussin and tylenol with no benefit. Fevers resolved.  Currently on Singulair, Advair 225m 1 puff twice a day, using albuterol twice a day.  Denies any wheezing. Some chest tightness.  No sick contacts that she is aware of.  Patient was not aware that her testing from UC came back positive for Covid-19. This is the first time she had Covid-19. She has been vaccinated.    2. Chronic perennial rhinitis (cockroach, dust mite, molds) No issues with the allergy shots.  3. GERD Stable with Dexilant and Pepcid.  02/20/2022 UC visit: "Patient here today for evaluation of nasal congestion, cough and sore throat that she has had the last 3 days.  She reports that she has had fever as well.  She has been taking Robitussin without significant relief.  She does endorse body aches.  She denies any vomiting or diarrhea.  Assessment and Plan: DShagunis a 66y.o. female with: COVID-19 virus infection Started having coughing, fevers last week. Went to UC and was not aware that her testing came back positive for Covid-19. Still has a cough  that's keeping her up at night. Fevers resolved. Up to date with Covid-19 vaccinations. No prior Covid-19 infection. Discussed with patient the positive Covid-19 test results. Take Paxlovid as prescribed given age and co-morbidities.  Letter written. Please be aware and limit exposures to other people - preferably wear a mask. Gave handout on Covid-19 infection. May take tessalon perles as needed for coughing.  If you get fevers again or have discolored mucous or have trouble breathing go to the urgent care or ER if after hours or over the weekend. During normal business hours you may call our office.   Moderate persistent asthma with (acute) exacerbation Asthma exacerbation secondary to Covid-19 infection. Start prednisone taper.  Daily controller medication(s): Singulair 176mdaily + Advair 250/50 one puff twice daily   Rescue medications: albuterol 4 puffs every 4-6 hours as needed   Perennial allergic rhinitis - Continue with allergy shots - skip this week and next week.  - Continue with all of your nose sprays on an as-needed basis. - Anticipate five years of allergen immunotherapy (September 2024).   GERD Controlled.  - Continue with Dexilant 60 mg daily.  - Continue with Pepcid 4088mwice daily as well.   Return in about 4 months (around 06/26/2022).  Meds ordered this encounter  Medications   nirmatrelvir & ritonavir (PAXLOVID, 300/100,) 20 x 150 MG & 10 x 100MG TBPK    Sig: Take 3 tablets by mouth in the morning and at bedtime. Take  300 mg nirmatrelvir (two 150 mg tablets) with 100 mg ritonavir (one 100 mg tablet) twice a day.    Dispense:  30 each    Refill:  0   benzonatate (TESSALON PERLES) 100 MG capsule    Sig: Take 1 capsule (100 mg total) by mouth 3 (three) times daily as needed for cough.    Dispense:  20 capsule    Refill:  0   Lab Orders  No laboratory test(s) ordered today    Diagnostics: None.   Medication List:  Current Outpatient Medications   Medication Sig Dispense Refill   Adalimumab (HUMIRA PEN) 40 MG/0.4ML PNKT Inject 40 mg into the skin every 14 (fourteen) days. 6 each 1   albuterol (PROAIR HFA) 108 (90 Base) MCG/ACT inhaler INHALE 4 PUFFS INTO THE LUNGS EVERY 4 HOURS AS NEEDED FOR SHORTNESS OF BREATH OR WHEEZING 18 g 1   azelastine (ASTELIN) 0.1 % nasal spray SPRAY TWO SPRAYS INTO EACH NOSTRIL TWICE A DAY AS NEEDED FOR RHINITIS 30 mL 5   benzonatate (TESSALON PERLES) 100 MG capsule Take 1 capsule (100 mg total) by mouth 3 (three) times daily as needed for cough. 20 capsule 0   CALCIUM-MAGNESIUM-VITAMIN D PO Take 2 tablets by mouth daily. With Zinc     dexlansoprazole (DEXILANT) 60 MG capsule Take 1 capsule (60 mg total) by mouth daily. 90 capsule 2   diphenhydrAMINE (BENADRYL) 25 MG tablet Take 1 tablet (25 mg total) by mouth every 6 (six) hours as needed. 20 tablet 0   EPINEPHrine (EPIPEN 2-PAK) 0.3 mg/0.3 mL IJ SOAJ injection Inject 0.3 mg into the muscle as needed for anaphylaxis. 1 each 0   famotidine (PEPCID) 40 MG tablet Take 1 tablet (40 mg total) by mouth 2 (two) times daily. 60 tablet 5   famotidine (PEPCID) 40 MG tablet 1 tablet at bedtime.     fluticasone-salmeterol (ADVAIR) 250-50 MCG/ACT AEPB Inhale 1 puff into the lungs in the morning and at bedtime. 180 each 1   montelukast (SINGULAIR) 10 MG tablet Take 1 tablet (10 mg total) by mouth at bedtime. 90 tablet 1   Multiple Vitamins-Minerals (MULTIVITAMIN WOMEN 50+) TABS 1 tablet     nirmatrelvir & ritonavir (PAXLOVID, 300/100,) 20 x 150 MG & 10 x 100MG TBPK Take 3 tablets by mouth in the morning and at bedtime. Take 300 mg nirmatrelvir (two 150 mg tablets) with 100 mg ritonavir (one 100 mg tablet) twice a day. 30 each 0   potassium chloride (K-DUR) 10 MEQ tablet Take 1 tablet (10 mEq total) by mouth daily. 90 tablet 1   budesonide-formoterol (SYMBICORT) 160-4.5 MCG/ACT inhaler Inhale 2 puffs into the lungs in the morning and at bedtime. (Patient not taking: Reported on  12/24/2021) 1 each 5   cetirizine (ZYRTEC) 10 MG tablet Take 1 tablet (10 mg total) by mouth 2 (two) times daily as needed for allergies (Can take an extra dose if needed during flare ups.). 180 tablet 2   No current facility-administered medications for this visit.   Allergies: Allergies  Allergen Reactions   Seasonal Ic [Cholestatin]    Tramadol     Major Headaches   Amoxicillin Itching and Rash   Levofloxacin Rash   Tramadol Hcl Rash   I reviewed her past medical history, social history, family history, and environmental history and no significant changes have been reported from her previous visit.  Review of Systems  Constitutional:  Positive for fever (resolved). Negative for appetite change, chills and unexpected weight change.  HENT:  Positive for congestion. Negative for rhinorrhea.   Eyes:  Negative for itching.  Respiratory:  Positive for cough and chest tightness. Negative for shortness of breath and wheezing.   Gastrointestinal:  Negative for abdominal pain.  Skin:  Negative for rash.  Allergic/Immunologic: Positive for environmental allergies.    Objective: BP (!) 132/90   Pulse 95   Temp 98.2 F (36.8 C)   Resp 18   Ht 5' 4"  (1.626 m)   Wt 181 lb 9.6 oz (82.4 kg)   SpO2 98%   BMI 31.17 kg/m  Body mass index is 31.17 kg/m. Physical Exam Vitals and nursing note reviewed.  Constitutional:      Appearance: Normal appearance. She is well-developed.  HENT:     Head: Normocephalic and atraumatic.     Right Ear: External ear normal.     Left Ear: External ear normal.  Eyes:     Conjunctiva/sclera: Conjunctivae normal.  Cardiovascular:     Rate and Rhythm: Normal rate and regular rhythm.     Heart sounds: Normal heart sounds. No murmur heard. Pulmonary:     Effort: Pulmonary effort is normal.     Breath sounds: Normal breath sounds. No wheezing, rhonchi or rales.  Musculoskeletal:     Cervical back: Neck supple.  Skin:    General: Skin is warm.      Findings: No rash.  Neurological:     Mental Status: She is alert and oriented to person, place, and time.  Psychiatric:        Behavior: Behavior normal.    Previous notes and tests were reviewed. The plan was reviewed with the patient/family, and all questions/concerned were addressed.  It was my pleasure to see Brianna Santana today and participate in her care. Please feel free to contact me with any questions or concerns.  Sincerely,  Rexene Alberts, DO Allergy & Immunology  [facility] office: [place] office: [phone]

## 2022-02-25 ENCOUNTER — Encounter: Payer: Self-pay | Admitting: Allergy

## 2022-02-25 NOTE — Assessment & Plan Note (Addendum)
Started having coughing, fevers last week. Went to UC and was not aware that her testing came back positive for Covid-19. Still has a cough that's keeping her up at night. Fevers resolved. Up to date with Covid-19 vaccinations. No prior Covid-19 infection. . Discussed with patient the positive Covid-19 test results. . Take Paxlovid as prescribed given age and co-morbidities.  . Letter written. Marland Kitchen Please be aware and limit exposures to other people - preferably wear a mask. o Gave handout on Covid-19 infection. . May take tessalon perles as needed for coughing.  . If you get fevers again or have discolored mucous or have trouble breathing go to the urgent care or ER if after hours or over the weekend. o During normal business hours you may call our office.

## 2022-02-25 NOTE — Assessment & Plan Note (Addendum)
-   Continue with allergy shots - skip this week and next week.  - Continue with all of your nose sprays on an as-needed basis. - Anticipate five years of allergen immunotherapy (September 2024).

## 2022-02-25 NOTE — Assessment & Plan Note (Signed)
Controlled.  - Continue with Dexilant 60 mg daily.  - Continue with Pepcid 74m twice daily as well.

## 2022-02-25 NOTE — Assessment & Plan Note (Signed)
Asthma exacerbation secondary to Covid-19 infection. . Start prednisone taper.  . Daily controller medication(s): Singulair 39m daily + Advair 250/50 one puff twice daily   . Rescue medications: albuterol 4 puffs every 4-6 hours as needed

## 2022-03-15 ENCOUNTER — Ambulatory Visit (INDEPENDENT_AMBULATORY_CARE_PROVIDER_SITE_OTHER): Payer: Medicare PPO | Admitting: *Deleted

## 2022-03-15 DIAGNOSIS — J309 Allergic rhinitis, unspecified: Secondary | ICD-10-CM

## 2022-03-26 ENCOUNTER — Ambulatory Visit (INDEPENDENT_AMBULATORY_CARE_PROVIDER_SITE_OTHER): Payer: Medicare PPO

## 2022-03-26 DIAGNOSIS — E876 Hypokalemia: Secondary | ICD-10-CM | POA: Diagnosis not present

## 2022-03-26 DIAGNOSIS — Z79899 Other long term (current) drug therapy: Secondary | ICD-10-CM | POA: Diagnosis not present

## 2022-03-26 DIAGNOSIS — J309 Allergic rhinitis, unspecified: Secondary | ICD-10-CM | POA: Diagnosis not present

## 2022-03-26 DIAGNOSIS — E041 Nontoxic single thyroid nodule: Secondary | ICD-10-CM | POA: Diagnosis not present

## 2022-04-05 DIAGNOSIS — Z23 Encounter for immunization: Secondary | ICD-10-CM | POA: Diagnosis not present

## 2022-04-05 DIAGNOSIS — K5090 Crohn's disease, unspecified, without complications: Secondary | ICD-10-CM | POA: Diagnosis not present

## 2022-04-05 DIAGNOSIS — J4540 Moderate persistent asthma, uncomplicated: Secondary | ICD-10-CM | POA: Diagnosis not present

## 2022-04-05 DIAGNOSIS — J309 Allergic rhinitis, unspecified: Secondary | ICD-10-CM | POA: Diagnosis not present

## 2022-04-05 DIAGNOSIS — E041 Nontoxic single thyroid nodule: Secondary | ICD-10-CM | POA: Diagnosis not present

## 2022-04-05 DIAGNOSIS — Z0000 Encounter for general adult medical examination without abnormal findings: Secondary | ICD-10-CM | POA: Diagnosis not present

## 2022-04-05 DIAGNOSIS — Z1331 Encounter for screening for depression: Secondary | ICD-10-CM | POA: Diagnosis not present

## 2022-04-05 DIAGNOSIS — E669 Obesity, unspecified: Secondary | ICD-10-CM | POA: Diagnosis not present

## 2022-04-05 DIAGNOSIS — K219 Gastro-esophageal reflux disease without esophagitis: Secondary | ICD-10-CM | POA: Diagnosis not present

## 2022-04-08 ENCOUNTER — Ambulatory Visit (INDEPENDENT_AMBULATORY_CARE_PROVIDER_SITE_OTHER): Payer: Medicare PPO

## 2022-04-08 DIAGNOSIS — J309 Allergic rhinitis, unspecified: Secondary | ICD-10-CM

## 2022-04-15 ENCOUNTER — Ambulatory Visit (INDEPENDENT_AMBULATORY_CARE_PROVIDER_SITE_OTHER): Payer: Medicare PPO | Admitting: *Deleted

## 2022-04-15 DIAGNOSIS — J309 Allergic rhinitis, unspecified: Secondary | ICD-10-CM

## 2022-04-20 DIAGNOSIS — M25552 Pain in left hip: Secondary | ICD-10-CM | POA: Diagnosis not present

## 2022-04-20 DIAGNOSIS — Z6831 Body mass index (BMI) 31.0-31.9, adult: Secondary | ICD-10-CM | POA: Diagnosis not present

## 2022-04-26 DIAGNOSIS — M47816 Spondylosis without myelopathy or radiculopathy, lumbar region: Secondary | ICD-10-CM | POA: Diagnosis not present

## 2022-04-26 DIAGNOSIS — Z6830 Body mass index (BMI) 30.0-30.9, adult: Secondary | ICD-10-CM | POA: Diagnosis not present

## 2022-04-26 DIAGNOSIS — M5030 Other cervical disc degeneration, unspecified cervical region: Secondary | ICD-10-CM | POA: Diagnosis not present

## 2022-04-27 ENCOUNTER — Other Ambulatory Visit: Payer: Self-pay | Admitting: *Deleted

## 2022-04-27 ENCOUNTER — Ambulatory Visit (INDEPENDENT_AMBULATORY_CARE_PROVIDER_SITE_OTHER): Payer: Medicare PPO | Admitting: *Deleted

## 2022-04-27 DIAGNOSIS — J309 Allergic rhinitis, unspecified: Secondary | ICD-10-CM

## 2022-04-27 MED ORDER — FAMOTIDINE 40 MG PO TABS: 40.0000 mg | ORAL_TABLET | Freq: Two times a day (BID) | ORAL | 1 refills | Status: DC

## 2022-04-28 DIAGNOSIS — M25552 Pain in left hip: Secondary | ICD-10-CM | POA: Diagnosis not present

## 2022-04-28 DIAGNOSIS — M25512 Pain in left shoulder: Secondary | ICD-10-CM | POA: Diagnosis not present

## 2022-04-28 DIAGNOSIS — J3089 Other allergic rhinitis: Secondary | ICD-10-CM | POA: Diagnosis not present

## 2022-04-28 NOTE — Progress Notes (Signed)
VIALS EXP 04-29-23

## 2022-05-10 NOTE — Telephone Encounter (Signed)
Patient requesting refill on humira. Please advise.

## 2022-05-10 NOTE — Telephone Encounter (Signed)
Spoke with patient & advised her that she should still have enough refills. Last supply was sent in on 12/28/21 for 6 months. She is due for a follow up per last OV note. OV scheduled with patient for 06/07/22 at 2:10 pm with Dr. Fuller Plan. Pt is aware she will need to keep this follow up so she can discuss further medication refills.

## 2022-05-17 DIAGNOSIS — M25512 Pain in left shoulder: Secondary | ICD-10-CM | POA: Diagnosis not present

## 2022-05-17 DIAGNOSIS — M25552 Pain in left hip: Secondary | ICD-10-CM | POA: Diagnosis not present

## 2022-06-01 ENCOUNTER — Ambulatory Visit (INDEPENDENT_AMBULATORY_CARE_PROVIDER_SITE_OTHER): Payer: Medicare PPO

## 2022-06-01 DIAGNOSIS — J309 Allergic rhinitis, unspecified: Secondary | ICD-10-CM | POA: Diagnosis not present

## 2022-06-07 ENCOUNTER — Encounter: Payer: Self-pay | Admitting: Gastroenterology

## 2022-06-07 ENCOUNTER — Ambulatory Visit: Payer: Medicare PPO | Admitting: Gastroenterology

## 2022-06-07 VITALS — BP 114/64 | HR 80 | Ht 64.0 in | Wt 180.0 lb

## 2022-06-07 DIAGNOSIS — K219 Gastro-esophageal reflux disease without esophagitis: Secondary | ICD-10-CM | POA: Diagnosis not present

## 2022-06-07 DIAGNOSIS — K5010 Crohn's disease of large intestine without complications: Secondary | ICD-10-CM | POA: Diagnosis not present

## 2022-06-07 MED ORDER — DEXLANSOPRAZOLE 60 MG PO CPDR: 60.0000 mg | DELAYED_RELEASE_CAPSULE | Freq: Every day | ORAL | 3 refills | Status: DC

## 2022-06-07 MED ORDER — HUMIRA (2 PEN) 40 MG/0.4ML ~~LOC~~ AJKT: 40.0000 mg | AUTO-INJECTOR | SUBCUTANEOUS | 1 refills | Status: DC

## 2022-06-07 NOTE — Progress Notes (Signed)
    Assessment     Crohn's colitis, well-controlled on Humira GERD Internal hemorrhoids, occasional bleeding   Recommendations    Continue Humira 40 mg SQ q14 days Continue Dexilant 60 mg daily and follow antireflux measures Colonoscopy recommended in June 2025 REV in 1 year   HPI    This is a 67 year old female with Crohn's colitis and GERD. No active GI symptoms. Feels well from a GI standpoint. Denies weight loss, abdominal pain, constipation, diarrhea, change in stool caliber, melena, hematochezia, nausea, vomiting, dysphagia, reflux symptoms, chest pain.    Labs / Imaging       Latest Ref Rng & Units 02/20/2022   11:54 AM 07/21/2021    1:26 PM 04/13/2021   11:58 AM  Hepatic Function  Total Protein 6.0 - 8.5 g/dL 7.7  7.8  8.1   Albumin 3.9 - 4.9 g/dL 4.9  4.6  4.8   AST 0 - 40 IU/L '22  24  30   '$ ALT 0 - 32 IU/L 28  30  37   Alk Phosphatase 44 - 121 IU/L 104  91  101   Total Bilirubin 0.0 - 1.2 mg/dL 0.7  0.7  1.0   Bilirubin, Direct 0.0 - 0.3 mg/dL  0.1  0.2        Latest Ref Rng & Units 01/01/2021    9:52 AM 11/21/2019    9:37 AM 06/08/2018   10:30 AM  CBC  WBC 4.0 - 10.5 K/uL 3.2  3.3  2.9   Hemoglobin 12.0 - 15.0 g/dL 12.8  12.5  12.8   Hematocrit 36.0 - 46.0 % 38.0  37.5  37.7   Platelets 150.0 - 400.0 K/uL 318.0  296.0  316.0    Current Medications, Allergies, Past Medical History, Past Surgical History, Family History and Social History were reviewed in [employer] record.   Physical Exam: General: Well developed, well nourished, no acute distress Head: Normocephalic and atraumatic Eyes: Sclerae anicteric, EOMI Ears: Normal auditory acuity Mouth: No deformities or lesions noted Lungs: Clear throughout to auscultation Heart: Regular rate and rhythm; No murmurs, rubs or bruits Abdomen: Soft, non tender and non distended. No masses, hepatosplenomegaly or hernias noted. Normal Bowel sounds Rectal: Not done Musculoskeletal:  Symmetrical with no gross deformities  Pulses:  Normal pulses noted Extremities: No edema or deformities noted Neurological: Alert oriented x 4, grossly nonfocal Psychological:  Alert and cooperative. Normal mood and affect   Doneta Bayman T. Fuller Plan, MD 06/07/2022, 2:25 PM

## 2022-06-07 NOTE — Patient Instructions (Signed)
We have sent the following medications to your pharmacy for you to pick up at your convenience: Humira and Dexilant.   The [facility] providers would like to encourage you to use [facility] to communicate with providers for non-urgent requests or questions.  Due to long hold times on the telephone, sending your provider a message by [facility] may be a faster and more efficient way to get a response.  Please allow 48 business hours for a response.  Please remember that this is for non-urgent requests.   Thank you for choosing me and [facility].  Pricilla Riffle. Dagoberto Ligas., MD., [facility] Regal

## 2022-06-29 ENCOUNTER — Ambulatory Visit (INDEPENDENT_AMBULATORY_CARE_PROVIDER_SITE_OTHER): Payer: Medicare PPO | Admitting: Allergy & Immunology

## 2022-06-29 ENCOUNTER — Other Ambulatory Visit: Payer: Self-pay

## 2022-06-29 ENCOUNTER — Ambulatory Visit: Payer: Self-pay

## 2022-06-29 ENCOUNTER — Encounter: Payer: Self-pay | Admitting: Allergy & Immunology

## 2022-06-29 VITALS — BP 132/80 | HR 80 | Temp 97.5°F | Resp 18

## 2022-06-29 DIAGNOSIS — J309 Allergic rhinitis, unspecified: Secondary | ICD-10-CM

## 2022-06-29 DIAGNOSIS — U071 COVID-19: Secondary | ICD-10-CM

## 2022-06-29 DIAGNOSIS — J4540 Moderate persistent asthma, uncomplicated: Secondary | ICD-10-CM | POA: Diagnosis not present

## 2022-06-29 DIAGNOSIS — J3089 Other allergic rhinitis: Secondary | ICD-10-CM

## 2022-06-29 DIAGNOSIS — J4541 Moderate persistent asthma with (acute) exacerbation: Secondary | ICD-10-CM

## 2022-06-29 MED ORDER — FLUTICASONE-SALMETEROL 250-50 MCG/ACT IN AEPB: 1.0000 | INHALATION_SPRAY | Freq: Two times a day (BID) | RESPIRATORY_TRACT | 1 refills | Status: DC

## 2022-06-29 MED ORDER — CETIRIZINE HCL 10 MG PO TABS: 10.0000 mg | ORAL_TABLET | Freq: Two times a day (BID) | ORAL | 1 refills | Status: DC | PRN

## 2022-06-29 MED ORDER — FAMOTIDINE 40 MG PO TABS: 40.0000 mg | ORAL_TABLET | Freq: Two times a day (BID) | ORAL | 1 refills | Status: DC

## 2022-06-29 MED ORDER — MONTELUKAST SODIUM 10 MG PO TABS: 10.0000 mg | ORAL_TABLET | Freq: Every day | ORAL | 1 refills | Status: DC

## 2022-06-29 MED ORDER — ALBUTEROL SULFATE HFA 108 (90 BASE) MCG/ACT IN AERS: INHALATION_SPRAY | RESPIRATORY_TRACT | 1 refills | Status: DC

## 2022-06-29 MED ORDER — AZELASTINE HCL 0.1 % NA SOLN: NASAL | 1 refills | Status: DC

## 2022-06-29 MED ORDER — EPINEPHRINE 0.3 MG/0.3ML IJ SOAJ: 0.3000 mg | INTRAMUSCULAR | 1 refills | Status: DC | PRN

## 2022-06-29 NOTE — Patient Instructions (Addendum)
1. Moderate persistent asthma, uncomplicated - Lung testing not done today.  - You can try the essential oils as needed, but it would be very expensive to use daily.  - Daily controller medication(s): Singulair '10mg'$  daily + Advair 250/50 one puff twice daily   - Rescue medications: albuterol 4 puffs every 4-6 hours as needed  - Asthma control goals:  * Full participation in all desired activities (may need albuterol before activity) * Albuterol use two time or less a week on average (not counting use with activity) * Cough interfering with sleep two time or less a month * Oral steroids no more than once a year * No hospitalizations  2. Chronic perennial rhinitis (cockroach, dust mite, molds) - Continue with allergy shots at the same schedule.  - Continue with all of your nose sprays on an as-needed basis. - Anticipate five years of allergen immunotherapy (September 2024), but we will continue past this since you have been doing so well.   3. GERD - Continue with Dexilant 60 mg daily.  - Continue with Pepcid '40mg'$  twice daily as well.   4. Return in about 6 months (around 12/28/2022).    Please inform us of any Emergency Department visits, hospitalizations, or changes in symptoms. Call us before going to the ED for breathing or allergy symptoms since we might be able to fit you in for a sick visit. Feel free to contact us anytime with any questions, problems, or concerns.  It was a pleasure to see you again today!    Websites that have reliable patient information: 1. American Academy of Asthma, Allergy, and Immunology: www.aaaai.org 2. Food Allergy Research and Education (FARE): foodallergy.org 3. Mothers of Asthmatics: http://www.asthmacommunitynetwork.org 4. American College of Allergy, Asthma, and Immunology: www.acaai.org   COVID-19 Vaccine Information can be found at: ShippingScam.co.uk For questions related to vaccine  distribution or appointments, please email [email or web address] or call [phone].   We realize that you might be concerned about having an allergic reaction to the COVID19 vaccines. To help with that concern, WE ARE OFFERING THE COVID19 VACCINES IN OUR OFFICE! Ask the front desk for dates!     "Like" [name] on Facebook and Instagram for our latest updates!      A healthy democracy works best when [employer] participate! Make sure you are registered to vote! If you have moved or changed any of your contact information, you will need to get this updated before voting!  In some cases, you MAY be able to register to vote online: [email or web address]

## 2022-06-29 NOTE — Progress Notes (Signed)
FOLLOW UP  Date of Service/Encounter:  06/29/22   Assessment:   Moderate persistent asthma without complication - with continued night time cough    Perennial allergic rhinitis  (cockroach, molds, dust mites) - on allergen immunotherapy with maintenance reached in September 2019   GERD - doing better on PPI and H2 blocker    Vaccinated to [place]   Plan/Recommendations:   1. Moderate persistent asthma, uncomplicated - Lung testing not done today.  - You can try the essential oils as needed, but it would be very expensive to use daily.  - Daily controller medication(s): Singulair '10mg'$  daily + Advair 250/50 one puff twice daily   - Rescue medications: albuterol 4 puffs every 4-6 hours as needed  - Asthma control goals:  * Full participation in all desired activities (may need albuterol before activity) * Albuterol use two time or less a week on average (not counting use with activity) * Cough interfering with sleep two time or less a month * Oral steroids no more than once a year * No hospitalizations  2. Chronic perennial rhinitis (cockroach, dust mite, molds) - Continue with allergy shots at the same schedule.  - Continue with all of your nose sprays on an as-needed basis. - Anticipate five years of allergen immunotherapy (September 2024), but we will continue past this since you have been doing so well.   3. GERD - Continue with Dexilant 60 mg daily.  - Continue with Pepcid '40mg'$  twice daily as well.   4. Return in about 6 months (around 12/28/2022).   Subjective:   Brianna Santana is a 67 y.o. 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  Brianna Santana is a 67 y.o. female presenting for a follow up visit. She was last seen in July 2023. At that time, she was doing very well with her Advair 250/13mg one puff BID in combination with Singulair daily. For her rhinitis, we continued with allergy shots at the same schedule as well as all of her medication on a PRN basis.   Since last visit, she has done really well. She unfortunately had COVID19 in late September and she was diagnosed with COVID19. She ended up getting Paxlovid and prednisone from our office (Urgent Care never called her with the results of the testing). Brianna Santana was not working to help her coughing.  She tells me that she needs a stronger cough syrup when she gets sick.  Asthma/Respiratory Symptom History: She is using Advair but only as needed. She tries to use it at least before she goes to bed. But her phlegm is clear at least.  She is wondering about using something called Breathe Again drops. Review of the site shows  that this is mostly herbal medications.   Allergic Rhinitis Symptom History: She remains on her medicines on a as needed basis.  Her allergy shots are going a lot with her symptoms.  She has not been on antibiotics at all since last time we saw her.  She remains on the Singulair 10 mg daily.  GERD Symptom History: He remains under Dexilant daily.  She has been using her Pepcid twice daily as needed.  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 She  finished this in December 2023. She continues to work on [place] through her neighborhood. Rebeca Allegra has been very responsive to this.   We go down the rabbit hole involving Burnie Madoff and [employer]. Evidently they were both in the hospital here in [place] when they were in prison.  Otherwise, there have been no changes to her past medical history, surgical history, family history, or social history.    Review of Systems  Constitutional: Negative.  Negative for chills, fever, malaise/fatigue and weight loss.  HENT: Negative.  Negative for congestion, ear discharge, ear pain and sinus pain.   Eyes:  Negative for pain, discharge and redness.  Respiratory:  Negative for cough, sputum production, shortness of breath and wheezing.   Cardiovascular: Negative.  Negative for chest pain and palpitations.  Gastrointestinal:  Negative for abdominal pain, constipation, diarrhea, heartburn, nausea and vomiting.  Skin: Negative.  Negative for itching and rash.  Neurological:  Negative for dizziness and headaches.  Endo/Heme/Allergies:  Negative for environmental allergies. Does not bruise/bleed easily.       Objective:   Blood pressure 132/80, pulse 80, temperature (!) 97.5 F (36.4 C), temperature source Temporal, resp. rate 18, SpO2 96 %. There is no height or weight on file to calculate BMI.    Physical Exam Vitals reviewed.  Constitutional:      Appearance: Normal appearance. She is well-developed.     Comments: Lovely and talkative.  HENT:     Head: Normocephalic and atraumatic.     Right Ear: Tympanic membrane, ear canal and external ear normal.     Left Ear: Tympanic membrane, ear canal and external ear normal.     Nose: No nasal deformity, septal deviation, mucosal edema or rhinorrhea.     Right Turbinates: Enlarged, swollen and pale.     Left Turbinates: Enlarged, swollen and pale.     Right Sinus: No maxillary sinus tenderness or frontal sinus tenderness.     Left Sinus:  No maxillary sinus tenderness or frontal sinus tenderness.     Mouth/Throat:     Mouth: Mucous membranes are not pale and not dry.     Pharynx: Uvula midline.     Comments: No cobblestoning noted.  Eyes:     General: Lids are normal. Allergic shiner present.        Right eye: No discharge.        Left eye: No discharge.     Conjunctiva/sclera: Conjunctivae normal.     Right eye: Right conjunctiva is not injected. No chemosis.    Left eye: Left conjunctiva is not injected. No chemosis.    Pupils: Pupils are equal, round, and reactive to light.  Cardiovascular:     Rate and Rhythm: Normal rate and regular rhythm.     Heart sounds: Normal heart sounds.  Pulmonary:     Effort: Pulmonary effort is normal. No tachypnea, accessory muscle usage or respiratory distress.     Breath sounds: Normal  breath sounds. No wheezing, rhonchi or rales.     Comments: Moving air well in all lung fields.  Chest:     Chest wall: No tenderness.  Lymphadenopathy:     Cervical: No cervical adenopathy.  Skin:    Coloration: Skin is not pale.     Findings: No abrasion, erythema, petechiae or rash. Rash is not papular, urticarial or vesicular.  Neurological:     Mental Status: She is alert.  Psychiatric:        Behavior: Behavior is cooperative.      Diagnostic studies:  none        Salvatore Marvel, MD  [facility] [place] [facility] [place]

## 2022-07-06 ENCOUNTER — Other Ambulatory Visit: Payer: Self-pay | Admitting: Allergy & Immunology

## 2022-07-06 ENCOUNTER — Ambulatory Visit (INDEPENDENT_AMBULATORY_CARE_PROVIDER_SITE_OTHER): Payer: Medicare PPO

## 2022-07-06 DIAGNOSIS — J309 Allergic rhinitis, unspecified: Secondary | ICD-10-CM | POA: Diagnosis not present

## 2022-07-13 ENCOUNTER — Ambulatory Visit (INDEPENDENT_AMBULATORY_CARE_PROVIDER_SITE_OTHER): Payer: Medicare PPO

## 2022-07-13 DIAGNOSIS — J309 Allergic rhinitis, unspecified: Secondary | ICD-10-CM | POA: Diagnosis not present

## 2022-07-26 ENCOUNTER — Ambulatory Visit (INDEPENDENT_AMBULATORY_CARE_PROVIDER_SITE_OTHER): Payer: Medicare PPO | Admitting: *Deleted

## 2022-07-26 DIAGNOSIS — J309 Allergic rhinitis, unspecified: Secondary | ICD-10-CM

## 2022-07-30 ENCOUNTER — Ambulatory Visit (INDEPENDENT_AMBULATORY_CARE_PROVIDER_SITE_OTHER): Payer: Medicare PPO | Admitting: *Deleted

## 2022-07-30 DIAGNOSIS — J309 Allergic rhinitis, unspecified: Secondary | ICD-10-CM

## 2022-08-23 ENCOUNTER — Ambulatory Visit (INDEPENDENT_AMBULATORY_CARE_PROVIDER_SITE_OTHER): Payer: Medicare PPO | Admitting: *Deleted

## 2022-08-23 DIAGNOSIS — J309 Allergic rhinitis, unspecified: Secondary | ICD-10-CM | POA: Diagnosis not present

## 2022-09-13 ENCOUNTER — Ambulatory Visit (INDEPENDENT_AMBULATORY_CARE_PROVIDER_SITE_OTHER): Payer: Medicare PPO | Admitting: Family Medicine

## 2022-09-13 ENCOUNTER — Other Ambulatory Visit: Payer: Self-pay

## 2022-09-13 ENCOUNTER — Encounter: Payer: Self-pay | Admitting: Family Medicine

## 2022-09-13 VITALS — BP 128/76 | HR 96 | Temp 98.2°F | Resp 18

## 2022-09-13 DIAGNOSIS — H1013 Acute atopic conjunctivitis, bilateral: Secondary | ICD-10-CM | POA: Diagnosis not present

## 2022-09-13 DIAGNOSIS — J4540 Moderate persistent asthma, uncomplicated: Secondary | ICD-10-CM

## 2022-09-13 DIAGNOSIS — J3089 Other allergic rhinitis: Secondary | ICD-10-CM | POA: Diagnosis not present

## 2022-09-13 DIAGNOSIS — K219 Gastro-esophageal reflux disease without esophagitis: Secondary | ICD-10-CM | POA: Diagnosis not present

## 2022-09-13 NOTE — Patient Instructions (Addendum)
Asthma Continue montelukast 10 mg once a day to prevent cough or wheeze Increase Wixela 250 to 1 puff twice a day to prevent cough or wheeze.  Pretreat with albuterol over the next several days Continue albuterol 2 puffs once every 4 hours as needed for cough or wheeze You may use albuterol 2 puffs 5 to 15 minutes before activity to decrease cough or wheeze  Allergic rhinitis Continue allergen avoidance measures directed toward mold, dust mite, and cockroach as listed below Continue azelastine 2 sprays in each nostril twice a day for nasal symptoms and postnasal drainage.  Begin Flonase 2 sprays in each nostril once a day for nasal congestion Begin saline nasal rinses as needed for nasal symptoms. Use this before any medicated nasal sprays for best result For thick postnasal drainage, begin Mucinex 600 to 1200 mg twice a day and increase fluid intake as tolerated Continue allergen immunotherapy and have access to an epinephrine autoinjector set  Allergic conjunctivitis Some over the counter eye drops include Pataday one drop in each eye once a day as needed for red, itchy eyes OR Zaditor one drop in each eye twice a day as needed for red itchy eyes. Avoid eye drops that say red eye relief as they may contain medications that dry out your eyes.   Reflux Continue dietary and lifestyle modifications as listed below Continue famotidine 40 mg twice a day Continue Dexilant 60 mg once a day  Call the clinic if this treatment plan is not working well for you.    Follow up in 1 month or sooner if needed.  Control of Mold Allergen Mold and fungi can grow on a variety of surfaces provided certain temperature and moisture conditions exist.  Outdoor molds grow on plants, decaying vegetation and soil.  The major outdoor mold, Alternaria and Cladosporium, are found in very high numbers during hot and dry conditions.  Generally, a late Summer - Fall peak is seen for common outdoor fungal spores.  Rain  will temporarily lower outdoor mold spore count, but counts rise rapidly when the rainy period ends.  The most important indoor molds are Aspergillus and Penicillium.  Dark, humid and poorly ventilated basements are ideal sites for mold growth.  The next most common sites of mold growth are the bathroom and the kitchen.  Outdoor [employer] Use air conditioning and keep windows closed Avoid exposure to decaying vegetation. Avoid leaf raking. Avoid grain handling. Consider wearing a face mask if working in moldy areas.  Indoor Mold Control Maintain humidity below 50%. Clean washable surfaces with 5% bleach solution. Remove sources e.g. Contaminated carpets.   Control of Dust Mite Allergen Dust mites play a major role in allergic asthma and rhinitis. They occur in environments with high humidity wherever human skin is found. Dust mites absorb humidity from the atmosphere (ie, they do not drink) and feed on organic matter (including shed human and animal skin). Dust mites are a microscopic type of insect that you cannot see with the naked eye. High levels of dust mites have been detected from mattresses, pillows, carpets, upholstered furniture, bed covers, clothes, soft toys and any woven material. The principal allergen of the dust mite is found in its feces. A gram of dust may contain 1,000 mites and 250,000 fecal particles. Mite antigen is easily measured in the air during house cleaning activities. Dust mites do not bite and do not cause harm to humans, other than by triggering allergies/asthma.  Ways to decrease your exposure to  dust mites in your home:  1. Encase mattresses, box springs and pillows with a mite-impermeable barrier or cover  2. Wash sheets, blankets and drapes weekly in hot water (130 F) with detergent and dry them in a dryer on the hot setting.  3. Have the room cleaned frequently with a vacuum cleaner and a damp dust-mop. For carpeting or rugs, vacuuming with a vacuum  cleaner equipped with a high-efficiency particulate air (HEPA) filter. The dust mite allergic individual should not be in a room which is being cleaned and should wait 1 hour after cleaning before going into the room.  4. Do not sleep on upholstered furniture (eg, couches).  5. If possible removing carpeting, upholstered furniture and drapery from the home is ideal. Horizontal blinds should be eliminated in the rooms where the person spends the most time (bedroom, study, television room). Washable vinyl, roller-type shades are optimal.  6. Remove all non-washable stuffed toys from the bedroom. Wash stuffed toys weekly like sheets and blankets above.  7. Reduce indoor humidity to less than 50%. Inexpensive humidity monitors can be purchased at most hardware stores. Do not use a humidifier as can make the problem worse and are not recommended.  Control of Cockroach Allergen Cockroach allergen has been identified as an important cause of acute attacks of asthma, especially in urban settings.  There are fifty-five species of cockroach that exist in the [place], however only three, the [place], [place] species produce allergen that can affect patients with Asthma.  Allergens can be obtained from fecal particles, egg casings and secretions from cockroaches.    Remove food sources. Reduce access to water. Seal access and entry points. Spray runways with 0.5-1% Diazinon or Chlorpyrifos Blow boric acid power under stoves and refrigerator. Place bait stations (hydramethylnon) at feeding sites.

## 2022-09-13 NOTE — Progress Notes (Signed)
[address] [place] [address] Dept: [phone]  FOLLOW UP NOTE  Patient ID: Brianna Santana, female    DOB: [birth year]  Age: 67 y.o. MRN: [id number] Date of Office Visit: 09/13/2022  Assessment  Chief Complaint: Cough and Sore Throat  HPI Brianna Santana is a 67 year old female who presents to the clinic for acute evaluation of sore throat and cough.  She was last seen in this clinic on 06/29/2022 by Dr. Dellis Anes for evaluation of asthma, allergic rhinitis, and reflux.  At today's visit, she reports that she has been experiencing a cough producing clear mucus for about 1 week.  She reports that on Thursday she began to experience a sore throat with itching which worsened on Friday.  She denies fever, sweats, chills, or sick contacts.  Asthma is reported as moderately well-controlled with rare shortness of breath, wheeze occurring over the last week, and cough occurring for 1 week and producing clear mucus which is described as occasionally thick and occasionally thin.  She continues montelukast 10 mg once a day, Wixela 250-1 puff once a day, and has used albuterol 4 times over the last week with mild relief of symptoms.  Allergic rhinitis is reported as poorly controlled with symptoms including occasional sneeze and copious postnasal drainage with frequent throat clearing and coughing.  She continues azelastine daily.  She is not currently using a nasal rinse or nasal steroid spray.  She began allergen immunotherapy directed toward mold, dust mite, and cockroach on 12/05/2017.  She denies large or local reactions and reports a significant decrease in her symptoms of allergic rhinitis while continuing on allergen immunotherapy.  Reflux is reported as well-controlled with no symptoms including heartburn or vomiting.  She continues Dexilant daily and famotidine 40 mg twice a day as previously prescribed.  Her current medications are listed in the chart.   Drug Allergies:   Allergies  Allergen Reactions   Seasonal Ic [Cholestatin]    Tramadol     Major Headaches   Amoxicillin Itching and Rash   Levofloxacin Rash   Tramadol Hcl Rash    Physical Exam: BP 128/76 (BP Location: Left Arm, Patient Position: Sitting, Cuff Size: Normal)   Pulse 96   Temp 98.2 F (36.8 C) (Temporal)   Resp 18   SpO2 97%    Physical Exam Vitals reviewed.  Constitutional:      Appearance: Normal appearance. She is well-developed.  HENT:     Head: Normocephalic and atraumatic.     Right Ear: Tympanic membrane normal.     Left Ear: Tympanic membrane normal.     Nose:     Comments: Bilateral nares edematous and pale with thin clear nasal drainage noted.  Pharynx slightly erythematous with no exudate.  Ears normal.  Eyes normal. Eyes:     Conjunctiva/sclera: Conjunctivae normal.  Cardiovascular:     Rate and Rhythm: Normal rate and regular rhythm.     Heart sounds: Normal heart sounds. No murmur heard. Pulmonary:     Effort: Pulmonary effort is normal.     Breath sounds: Normal breath sounds.     Comments: Lungs clear to auscultation Musculoskeletal:        General: Normal range of motion.     Cervical back: Normal range of motion and neck supple.  Skin:    General: Skin is warm and dry.  Neurological:     Mental Status: She is alert and oriented to person, place, and time.  Psychiatric:  Mood and Affect: Mood normal.        Behavior: Behavior normal.        Thought Content: Thought content normal.        Judgment: Judgment normal.     Diagnostics: Deferred due to frequent coughing during the patient interview  Assessment and Plan: 1. Not well controlled moderate persistent asthma   2. Perennial allergic rhinitis   3. Gastroesophageal reflux disease, unspecified whether esophagitis present   4. Allergic conjunctivitis, bilateral     Patient Instructions  Asthma Continue montelukast 10 mg once a day to prevent cough or wheeze Increase Wixela 250  to 1 puff twice a day to prevent cough or wheeze.  Pretreat with albuterol over the next several days Continue albuterol 2 puffs once every 4 hours as needed for cough or wheeze You may use albuterol 2 puffs 5 to 15 minutes before activity to decrease cough or wheeze  Allergic rhinitis Continue allergen avoidance measures directed toward mold, dust mite, and cockroach as listed below Continue azelastine 2 sprays in each nostril twice a day for nasal symptoms and postnasal drainage.  Begin Flonase 2 sprays in each nostril once a day for nasal congestion Begin saline nasal rinses as needed for nasal symptoms. Use this before any medicated nasal sprays for best result For thick postnasal drainage, begin Mucinex 600 to 1200 mg twice a day and increase fluid intake as tolerated Continue allergen immunotherapy and have access to an epinephrine autoinjector set  Allergic conjunctivitis Some over the counter eye drops include Pataday one drop in each eye once a day as needed for red, itchy eyes OR Zaditor one drop in each eye twice a day as needed for red itchy eyes. Avoid eye drops that say red eye relief as they may contain medications that dry out your eyes.   Reflux Continue dietary and lifestyle modifications as listed below Continue famotidine 40 mg twice a day Continue Dexilant 60 mg once a day  Call the clinic if this treatment plan is not working well for you.    Follow up in 1 month or sooner if needed.   Return in about 4 weeks (around 10/11/2022), or if symptoms worsen or fail to improve.    Thank you for the opportunity to care for this patient.  Please do not hesitate to contact me with questions.  Thermon Leyland, FNP [facility]

## 2022-09-16 DIAGNOSIS — J208 Acute bronchitis due to other specified organisms: Secondary | ICD-10-CM | POA: Diagnosis not present

## 2022-09-19 ENCOUNTER — Emergency Department (HOSPITAL_COMMUNITY)
Admission: EM | Admit: 2022-09-19 | Discharge: 2022-09-19 | Disposition: A | Discharge status: Home / Self Care | Payer: Medicare PPO | Attending: Emergency Medicine | Admitting: Emergency Medicine

## 2022-09-19 ENCOUNTER — Other Ambulatory Visit: Payer: Self-pay

## 2022-09-19 ENCOUNTER — Emergency Department (HOSPITAL_COMMUNITY): Payer: Medicare PPO

## 2022-09-19 DIAGNOSIS — Z8616 Personal history of COVID-19: Secondary | ICD-10-CM | POA: Insufficient documentation

## 2022-09-19 DIAGNOSIS — J4541 Moderate persistent asthma with (acute) exacerbation: Secondary | ICD-10-CM | POA: Diagnosis not present

## 2022-09-19 DIAGNOSIS — Z7951 Long term (current) use of inhaled steroids: Secondary | ICD-10-CM | POA: Diagnosis not present

## 2022-09-19 DIAGNOSIS — R059 Cough, unspecified: Secondary | ICD-10-CM | POA: Diagnosis not present

## 2022-09-19 DIAGNOSIS — J4531 Mild persistent asthma with (acute) exacerbation: Secondary | ICD-10-CM | POA: Diagnosis not present

## 2022-09-19 DIAGNOSIS — R110 Nausea: Secondary | ICD-10-CM | POA: Diagnosis not present

## 2022-09-19 DIAGNOSIS — J40 Bronchitis, not specified as acute or chronic: Secondary | ICD-10-CM | POA: Diagnosis not present

## 2022-09-19 DIAGNOSIS — Z1152 Encounter for screening for COVID-19: Secondary | ICD-10-CM | POA: Insufficient documentation

## 2022-09-19 DIAGNOSIS — I10 Essential (primary) hypertension: Secondary | ICD-10-CM | POA: Diagnosis not present

## 2022-09-19 DIAGNOSIS — R0602 Shortness of breath: Secondary | ICD-10-CM | POA: Diagnosis not present

## 2022-09-19 DIAGNOSIS — Z79899 Other long term (current) drug therapy: Secondary | ICD-10-CM | POA: Diagnosis not present

## 2022-09-19 DIAGNOSIS — J209 Acute bronchitis, unspecified: Secondary | ICD-10-CM | POA: Diagnosis not present

## 2022-09-19 LAB — COMPREHENSIVE METABOLIC PANEL
ALT: 22 U/L (ref 0–44)
AST: 26 U/L (ref 15–41)
Albumin: 4 g/dL (ref 3.5–5.0)
Alkaline Phosphatase: 93 U/L (ref 38–126)
Anion gap: 13 (ref 5–15)
BUN: 12 mg/dL (ref 8–23)
CO2: 22 mmol/L (ref 22–32)
Calcium: 9.3 mg/dL (ref 8.9–10.3)
Chloride: 99 mmol/L (ref 98–111)
Creatinine, Ser: 0.77 mg/dL (ref 0.44–1.00)
GFR, Estimated: >60 mL/min (ref >60)
Glucose, Bld: 120 mg/dL — ABNORMAL HIGH (ref 70–99)
Potassium: 3.7 mmol/L (ref 3.5–5.1)
Sodium: 134 mmol/L — ABNORMAL LOW (ref 135–145)
Total Bilirubin: 1.3 mg/dL — ABNORMAL HIGH (ref 0.3–1.2)
Total Protein: 7 g/dL (ref 6.5–8.1)

## 2022-09-19 LAB — CBC WITH DIFFERENTIAL/PLATELET
Abs Immature Granulocytes: 0.01 10*3/uL (ref 0.00–0.07)
Basophils Absolute: 0.1 10*3/uL (ref 0.0–0.1)
Basophils Relative: 1 %
Eosinophils Absolute: 0.1 10*3/uL (ref 0.0–0.5)
Eosinophils Relative: 3 %
HCT: 37.3 % (ref 36.0–46.0)
Hemoglobin: 12.8 g/dL (ref 12.0–15.0)
Immature Granulocytes: 0 %
Lymphocytes Relative: 50 %
Lymphs Abs: 2.6 10*3/uL (ref 0.7–4.0)
MCH: 30.5 pg (ref 26.0–34.0)
MCHC: 34.3 g/dL (ref 30.0–36.0)
MCV: 89 fL (ref 80.0–100.0)
Monocytes Absolute: 0.4 10*3/uL (ref 0.1–1.0)
Monocytes Relative: 7 %
Neutro Abs: 2 10*3/uL (ref 1.7–7.7)
Neutrophils Relative %: 39 %
Platelets: 298 10*3/uL (ref 150–400)
RBC: 4.19 MIL/uL (ref 3.87–5.11)
RDW: 13.3 % (ref 11.5–15.5)
WBC: 5.1 10*3/uL (ref 4.0–10.5)
nRBC: 0 % (ref 0.0–0.2)

## 2022-09-19 LAB — LIPASE, BLOOD: Lipase: 26 U/L (ref 11–51)

## 2022-09-19 LAB — RESP PANEL BY RT-PCR (RSV, FLU A&B, COVID)  RVPGX2
Influenza A by PCR: NEGATIVE
Influenza B by PCR: NEGATIVE
Resp Syncytial Virus by PCR: NEGATIVE
SARS Coronavirus 2 by RT PCR: NEGATIVE

## 2022-09-19 LAB — TROPONIN I (HIGH SENSITIVITY)
Troponin I (High Sensitivity): 2 ng/L (ref <18)
Troponin I (High Sensitivity): 4 ng/L (ref <18)

## 2022-09-19 MED ORDER — ONDANSETRON 4 MG PO TBDP: 4.0000 mg | ORAL_TABLET | Freq: Three times a day (TID) | ORAL | 0 refills | Status: DC | PRN

## 2022-09-19 MED ORDER — BENZONATATE 100 MG PO CAPS
200.0000 mg | ORAL_CAPSULE | Freq: Once | ORAL | Status: AC
  Administered 2022-09-19: 200 mg via ORAL
  Filled 2022-09-19: qty 2

## 2022-09-19 MED ORDER — METHYLPREDNISOLONE SODIUM SUCC 125 MG IJ SOLR
125.0000 mg | Freq: Once | INTRAMUSCULAR | Status: AC
  Administered 2022-09-19: 125 mg via INTRAVENOUS
  Filled 2022-09-19: qty 2

## 2022-09-19 MED ORDER — ONDANSETRON HCL 4 MG/2ML IJ SOLN
4.0000 mg | Freq: Once | INTRAMUSCULAR | Status: AC | PRN
  Administered 2022-09-19: 4 mg via INTRAVENOUS
  Filled 2022-09-19: qty 2

## 2022-09-19 MED ORDER — MAGNESIUM SULFATE 2 GM/50ML IV SOLN
2.0000 g | Freq: Once | INTRAVENOUS | Status: AC
  Administered 2022-09-19: 2 g via INTRAVENOUS
  Filled 2022-09-19: qty 50

## 2022-09-19 MED ORDER — BENZONATATE 100 MG PO CAPS: 100.0000 mg | ORAL_CAPSULE | Freq: Three times a day (TID) | ORAL | 0 refills | Status: DC

## 2022-09-19 MED ORDER — IPRATROPIUM-ALBUTEROL 0.5-2.5 (3) MG/3ML IN SOLN
3.0000 mL | Freq: Once | RESPIRATORY_TRACT | Status: AC
  Administered 2022-09-19: 3 mL via RESPIRATORY_TRACT
  Filled 2022-09-19: qty 3

## 2022-09-19 NOTE — Discharge Instructions (Addendum)
You came to the emergency department today with a cough.  Your x-ray reveals bronchitis which we suspected.  I believe you are having an asthma flareup secondary to this bronchitis.  You were treated with albuterol, IV steroids, magnesium and benzonatate.  I sent the benzonatate cough tablets to your pharmacy.  I encourage you to start using Mucinex again to try and break up any congestion but remember, take it with a lot of water.  I have sent Zofran over to your pharmacy as well.   Most importantly, please make an appointment with your asthma specialist or PCP for reevaluation this week.  With any worsening symptoms please return directly to the nearest emergency department.  It was a pleasure to meet you and we hope you feel better!

## 2022-09-19 NOTE — ED Provider Notes (Signed)
Accepted handoff at shift change from [facility]. Please see prior provider note for more detail.   Briefly: Patient is 67 y.o. presents to the ED complaining of cough for the past 2 weeks.  She has been taking OTC medications, using her inhaler, and taking prednisone, doxycycline, narcotic cough medicine.  DDX: concern for acute asthma exacerbation, pneumonia, bronchitis  Plan: Reassess patient following IV magnesium treatment.     Results for orders placed or performed during the hospital encounter of 09/19/22  Resp panel by RT-PCR (RSV, Flu A&B, Covid) Anterior Nasal Swab   Specimen: Anterior Nasal Swab  Result Value Ref Range   SARS Coronavirus 2 by RT PCR NEGATIVE NEGATIVE   Influenza A by PCR NEGATIVE NEGATIVE   Influenza B by PCR NEGATIVE NEGATIVE   Resp Syncytial Virus by PCR NEGATIVE NEGATIVE  Comprehensive metabolic panel  Result Value Ref Range   Sodium 134 (L) 135 - 145 mmol/L   Potassium 3.7 3.5 - 5.1 mmol/L   Chloride 99 98 - 111 mmol/L   CO2 22 22 - 32 mmol/L   Glucose, Bld 120 (H) 70 - 99 mg/dL   BUN 12 8 - 23 mg/dL   Creatinine, Ser 8.65 0.44 - 1.00 mg/dL   Calcium 9.3 8.9 - 78.4 mg/dL   Total Protein 7.0 6.5 - 8.1 g/dL   Albumin 4.0 3.5 - 5.0 g/dL   AST 26 15 - 41 U/L   ALT 22 0 - 44 U/L   Alkaline Phosphatase 93 38 - 126 U/L   Total Bilirubin 1.3 (H) 0.3 - 1.2 mg/dL   GFR, Estimated >69 >62 mL/min   Anion gap 13 5 - 15  Lipase, blood  Result Value Ref Range   Lipase 26 11 - 51 U/L  CBC with Differential  Result Value Ref Range   WBC 5.1 4.0 - 10.5 K/uL   RBC 4.19 3.87 - 5.11 MIL/uL   Hemoglobin 12.8 12.0 - 15.0 g/dL   HCT 95.2 84.1 - 32.4 %   MCV 89.0 80.0 - 100.0 fL   MCH 30.5 26.0 - 34.0 pg   MCHC 34.3 30.0 - 36.0 g/dL   RDW 40.1 02.7 - 25.3 %   Platelets 298 150 - 400 K/uL   nRBC 0.0 0.0 - 0.2 %   Neutrophils Relative % 39 %   Neutro Abs 2.0 1.7 - 7.7 K/uL   Lymphocytes Relative 50 %   Lymphs Abs 2.6 0.7 - 4.0 K/uL   Monocytes  Relative 7 %   Monocytes Absolute 0.4 0.1 - 1.0 K/uL   Eosinophils Relative 3 %   Eosinophils Absolute 0.1 0.0 - 0.5 K/uL   Basophils Relative 1 %   Basophils Absolute 0.1 0.0 - 0.1 K/uL   Immature Granulocytes 0 %   Abs Immature Granulocytes 0.01 0.00 - 0.07 K/uL  Troponin I (High Sensitivity)  Result Value Ref Range   Troponin I (High Sensitivity) 2 <18 ng/L  Troponin I (High Sensitivity)  Result Value Ref Range   Troponin I (High Sensitivity) 4 <18 ng/L   DG Chest 2 View  Result Date: 09/19/2022 CLINICAL DATA:  Shortness of breath EXAM: CHEST - 2 VIEW COMPARISON:  09/29/2021 FINDINGS: The heart size and mediastinal contours are within normal limits. Mildly prominent bibasilar interstitial markings. No lobar consolidation. No pleural effusion or pneumothorax. The visualized skeletal structures are unremarkable. IMPRESSION: Mildly prominent bibasilar interstitial markings, which may reflect bronchitic type lung changes. Electronically Signed   By: Duanne Guess D.O.  On: 09/19/2022 15:51    Physical Exam  BP 138/73   Pulse 62   Temp 97.7 F (36.5 C) (Oral)   Resp 13   SpO2 100%   Physical Exam Vitals and nursing note reviewed.  Constitutional:      General: She is not in acute distress.    Appearance: Normal appearance. She is not ill-appearing or diaphoretic.  Cardiovascular:     Rate and Rhythm: Normal rate and regular rhythm.  Pulmonary:     Effort: Pulmonary effort is normal. No tachypnea, accessory muscle usage or respiratory distress.     Breath sounds: Normal breath sounds and air entry.  Neurological:     Mental Status: She is alert. Mental status is at baseline.  Psychiatric:        Mood and Affect: Mood normal.        Behavior: Behavior normal.     Procedures  Procedures  ED Course / MDM   Clinical Course as of 09/19/22 2155  Sun Sep 19, 2022  1959 Patient reevaluated and has not been given medications.  Will again reach out to nursing staff [MR]     Clinical Course User Index [MR] Redwine, Gabriel Cirri, PA-C   Medical Decision Making Amount and/or Complexity of Data Reviewed Labs: ordered. Radiology: ordered.  Risk Prescription drug management.   Upon reassessment, patient lung sounds are clear.  There is no wheezing.  She has adequate tidal volume.  She did feel nauseated following magnesium and was given IV Zofran with improvement.  Will send her home with ODT Zofran for nausea.  Patient understands to call her primary care provider tomorrow to schedule a follow-up appointment.  Patient states she feels comfortable going home and that she does feel better.         Clydia, Nieves, PA-C 09/19/22 2231    Maia Plan, MD 09/21/22 [phone]

## 2022-09-19 NOTE — ED Triage Notes (Signed)
Pt BIBA for cough with sob, exertional sob and dizziness with coughing. Patient seen by PCP for same and prescribed prednisone and cough syrup with no relief. A&Ox4, talking in complete sentences.

## 2022-09-19 NOTE — ED Provider Triage Note (Signed)
Emergency Medicine Provider Triage Evaluation Note  Amarachukwu Lakatos , a 67 y.o. female  was evaluated in triage.  Patient complains of 3 weeks worth of a cough.  She says that she has tried to get phlegm up but sometimes it feels like it while calm.  Associated nausea, she feels like if she throws up she will feel better.  Says "it feels like there is something sitting at the bottom of my chest."  Also endorses some lightheadedness.  No history of DVT/PE, recent travel, recent surgery, tobacco use or leg swelling.  PCP has been treating her outpatient for bronchitis.  Prescribed "some cough syrup" and prednisone.  Review of Systems  Positive:  Negative:   Physical Exam  SpO2 98%  Gen:   Awake, no distress   Resp:  Normal effort  MSK:   Moves extremities without difficulty  Other:  Normal work of breathing.  Very minor wheezing in the upper lung reproducible discomfort in the epigastrium.  Medical Decision Making  Medically screening exam initiated at 3:15 PM.  Appropriate orders placed.  [name] was informed that the remainder of the evaluation will be completed by another provider, this initial triage assessment does not replace that evaluation, and the importance of remaining in the ED until their evaluation is complete.     Saddie Benders, PA-C 09/19/22 1518

## 2022-09-19 NOTE — ED Provider Notes (Signed)
[place] EMERGENCY DEPARTMENT AT [facility] Provider Note   CSN: [id number] Arrival date & time: 09/19/22  1508     History  Chief Complaint  Patient presents with   Cough    Brianna Santana is a 67 y.o. female with a past medical history of moderate persistent asthma, recurrent cough and GERD presenting today due to a cough that is occasionally productive for the past 2 weeks.  She says that she used to have more sputum production but now she feels as though she cannot get it up.  Also feels as though she would feel much better if she had an episode of emesis as she is very nauseated.  Has done over-the-counter medications such as Mucinex, her albuterol inhaler and prednisone via PCP.   Cough Associated symptoms: no chest pain, no chills, no fever and no shortness of breath        Home Medications Prior to Admission medications   Medication Sig Start Date End Date Taking? Authorizing Provider  Adalimumab (HUMIRA PEN) 40 MG/0.4ML PNKT Inject 40 mg into the skin every 14 (fourteen) days. 06/07/22   Meryl Dare, MD  albuterol (VENTOLIN HFA) 108 (90 Base) MCG/ACT inhaler INHALE 2 PUFFS INTO THE LUNGS EVERY 4 HOURS AS NEEDED FOR SHORTNESS OF BREATH OR WHEEZING (MAXIMUM 12 PUFFS TOTAL PER 24 HOURS) 07/06/22   Alfonse Spruce, MD  azelastine (ASTELIN) 0.1 % nasal spray SPRAY TWO SPRAYS INTO EACH NOSTRIL TWICE A DAY AS NEEDED FOR RHINITIS 06/29/22   Alfonse Spruce, MD  CALCIUM-MAGNESIUM-VITAMIN D PO Take 2 tablets by mouth daily. With Zinc    [provider]  cetirizine (ZYRTEC) 10 MG tablet Take 1 tablet (10 mg total) by mouth 2 (two) times daily as needed for allergies (Can take an extra dose if needed during flare ups.). 06/29/22   Alfonse Spruce, MD  dexlansoprazole (DEXILANT) 60 MG capsule Take 1 capsule (60 mg total) by mouth daily. 06/07/22   Meryl Dare, MD  diphenhydrAMINE (BENADRYL) 25 MG tablet Take 1 tablet (25 mg total) by mouth  every 6 (six) hours as needed. 10/16/19   Harlene Salts A, PA-C  ELDERBERRY PO Take by mouth.    [provider]  EPINEPHrine (EPIPEN 2-PAK) 0.3 mg/0.3 mL IJ SOAJ injection Inject 0.3 mg into the muscle as needed for anaphylaxis. 06/29/22   Alfonse Spruce, MD  famotidine (PEPCID) 40 MG tablet Take 1 tablet (40 mg total) by mouth 2 (two) times daily. 06/29/22   Alfonse Spruce, MD  fluticasone-salmeterol (ADVAIR) 250-50 MCG/ACT AEPB Inhale 1 puff into the lungs in the morning and at bedtime. 06/29/22   Alfonse Spruce, MD  montelukast (SINGULAIR) 10 MG tablet Take 1 tablet (10 mg total) by mouth at bedtime. 06/29/22   Alfonse Spruce, MD  potassium chloride (K-DUR) 10 MEQ tablet Take 1 tablet (10 mEq total) by mouth daily. 11/21/18   Meryl Dare, MD  pregabalin (LYRICA) 75 MG capsule Take 75 mg by mouth 2 (two) times daily.    [provider]      Allergies    Seasonal ic [cholestatin], Tramadol, Amoxicillin, Levofloxacin, and Tramadol hcl    Review of Systems   Review of Systems  Constitutional:  Negative for chills and fever.  Respiratory:  Positive for cough and chest tightness. Negative for shortness of breath.   Cardiovascular:  Negative for chest pain, palpitations and leg swelling.    Physical Exam Updated Vital Signs  BP (!) 138/90 (BP Location: Left Arm)   Pulse 75   Temp 97.8 F (36.6 C) (Oral)   Resp 20   SpO2 100%  Physical Exam HENT:     Mouth/Throat:     Mouth: Mucous membranes are moist.     Pharynx: Oropharynx is clear.  Cardiovascular:     Rate and Rhythm: Normal rate and regular rhythm.  Pulmonary:     Effort: Pulmonary effort is normal. No respiratory distress.     Breath sounds: Wheezing (Upper lung fields) present. No rhonchi or rales.     Comments: Dry cough Musculoskeletal:        General: No swelling or tenderness.     Right lower leg: No edema.     Left lower leg: No edema.     ED Results / Procedures /  Treatments   Labs (all labs ordered are listed, but only abnormal results are displayed) Labs Reviewed  COMPREHENSIVE METABOLIC PANEL - Abnormal; Notable for the following components:      Result Value   Sodium 134 (*)    Glucose, Bld 120 (*)    Total Bilirubin 1.3 (*)    All other components within normal limits  RESP PANEL BY RT-PCR (RSV, FLU A&B, COVID)  RVPGX2  LIPASE, BLOOD  CBC WITH DIFFERENTIAL/PLATELET  TROPONIN I (HIGH SENSITIVITY)  TROPONIN I (HIGH SENSITIVITY)    EKG None  Radiology DG Chest 2 View  Result Date: 09/19/2022 CLINICAL DATA:  Shortness of breath EXAM: CHEST - 2 VIEW COMPARISON:  09/29/2021 FINDINGS: The heart size and mediastinal contours are within normal limits. Mildly prominent bibasilar interstitial markings. No lobar consolidation. No pleural effusion or pneumothorax. The visualized skeletal structures are unremarkable. IMPRESSION: Mildly prominent bibasilar interstitial markings, which may reflect bronchitic type lung changes. Electronically Signed   By: Duanne Guess D.O.   On: 09/19/2022 15:51    Procedures Procedures   Medications Ordered in ED Medications  ipratropium-albuterol (DUONEB) 0.5-2.5 (3) MG/3ML nebulizer solution 3 mL (has no administration in time range)  magnesium sulfate IVPB 2 g 50 mL (has no administration in time range)  ondansetron (ZOFRAN) injection 4 mg (has no administration in time range)  benzonatate (TESSALON) capsule 200 mg (has no administration in time range)  methylPREDNISolone sodium succinate (SOLU-MEDROL) 125 mg/2 mL injection 125 mg (125 mg Intravenous Given 09/19/22 2040)    ED Course/ Medical Decision Making/ A&P Clinical Course as of 09/19/22 2040  Sun Sep 19, 2022  1959 Patient reevaluated and has not been given medications.  Will again reach out to nursing staff [MR]    Clinical Course User Index [MR] Rian Koon, Gabriel Cirri, PA-C                             Medical Decision Making Amount and/or  Complexity of Data Reviewed Labs: ordered. Radiology: ordered.  Risk Prescription drug management.   67 year old female presenting with a cough.  Differential includes but is not limited to PE, GERD, pneumonia, bronchitis.  This is not an exhaustive differential.    Past Medical History / Co-morbidities / Social History: Asthma   Additional history: Per chart review patient follows with an allergy specialist.  She has multiple visits for poorly controlled asthma and was diagnosed with bronchitis by her PCP with [facility] on 4/18.  She was prescribed doxycycline and narcotic cough medication at that time.   Physical Exam: Pertinent physical exam findings include Wheezing in bilateral upper  lung fields, normal lower extremity exam  Lab Tests: I ordered, and personally interpreted labs.  The pertinent results include: Negative troponin, normal white count   Imaging Studies: Chest x-ray with likely bronchitis   Medications: Albuterol, Solu-Medrol, mag +benzonatate     MDM/Disposition: This is a 67 year old female who presented today with a dry cough.  She was diagnosed with bronchitis however continues to have some slight wheezing and a nonproductive cough.  Differential was broad and inclusive of PE.  Patient has no risk factors for PE so dimer/CT angiogram was not ordered.  She did have some wheezing on physical exam or x-ray diagnoses of bronchitis in addition to her poorly controlled asthma followed by outpatient clinics it is more reasonable to treat this as an asthma exacerbation secondary to bronchitis.  Unfortunately, patient's medications have been ordered for 3 hours and she has not received any of them.  I am unable to reassess her prior to the end of my shift.  She will be signed out to [employer], PA-C.  Plan will be to reassess after medications and see if the patient is feeling better and if so she can be discharged home.  Final Clinical Impression(s) / ED Diagnoses Final  diagnoses:  Moderate persistent asthma with exacerbation  Bronchitis    Rx / DC Orders ED Discharge Orders          Ordered    benzonatate (TESSALON) 100 MG capsule  Every 8 hours        09/19/22 1752           Results and diagnoses were explained to the patient. Return precautions discussed in full. Patient had no additional questions and expressed complete understanding.   This chart was dictated using voice recognition software.  Despite best efforts to proofread,  errors can occur which can change the documentation meaning.    Woodroe Chen 09/19/22 2050    Rondel Baton, MD 09/19/22 2137

## 2022-09-30 DIAGNOSIS — Z6831 Body mass index (BMI) 31.0-31.9, adult: Secondary | ICD-10-CM | POA: Diagnosis not present

## 2022-09-30 DIAGNOSIS — Z79899 Other long term (current) drug therapy: Secondary | ICD-10-CM | POA: Diagnosis not present

## 2022-09-30 DIAGNOSIS — K5090 Crohn's disease, unspecified, without complications: Secondary | ICD-10-CM | POA: Diagnosis not present

## 2022-09-30 DIAGNOSIS — J45909 Unspecified asthma, uncomplicated: Secondary | ICD-10-CM | POA: Diagnosis not present

## 2022-09-30 DIAGNOSIS — D84821 Immunodeficiency due to drugs: Secondary | ICD-10-CM | POA: Diagnosis not present

## 2022-10-27 IMAGING — CR DG CHEST 2V
2 series · 2 of 2 positions shown · non-contrast
Comparison: Chest radiograph 04/05/2016.

CLINICAL DATA: Chronic cough

EXAM:
CHEST - 2 VIEW

[w chest pa]
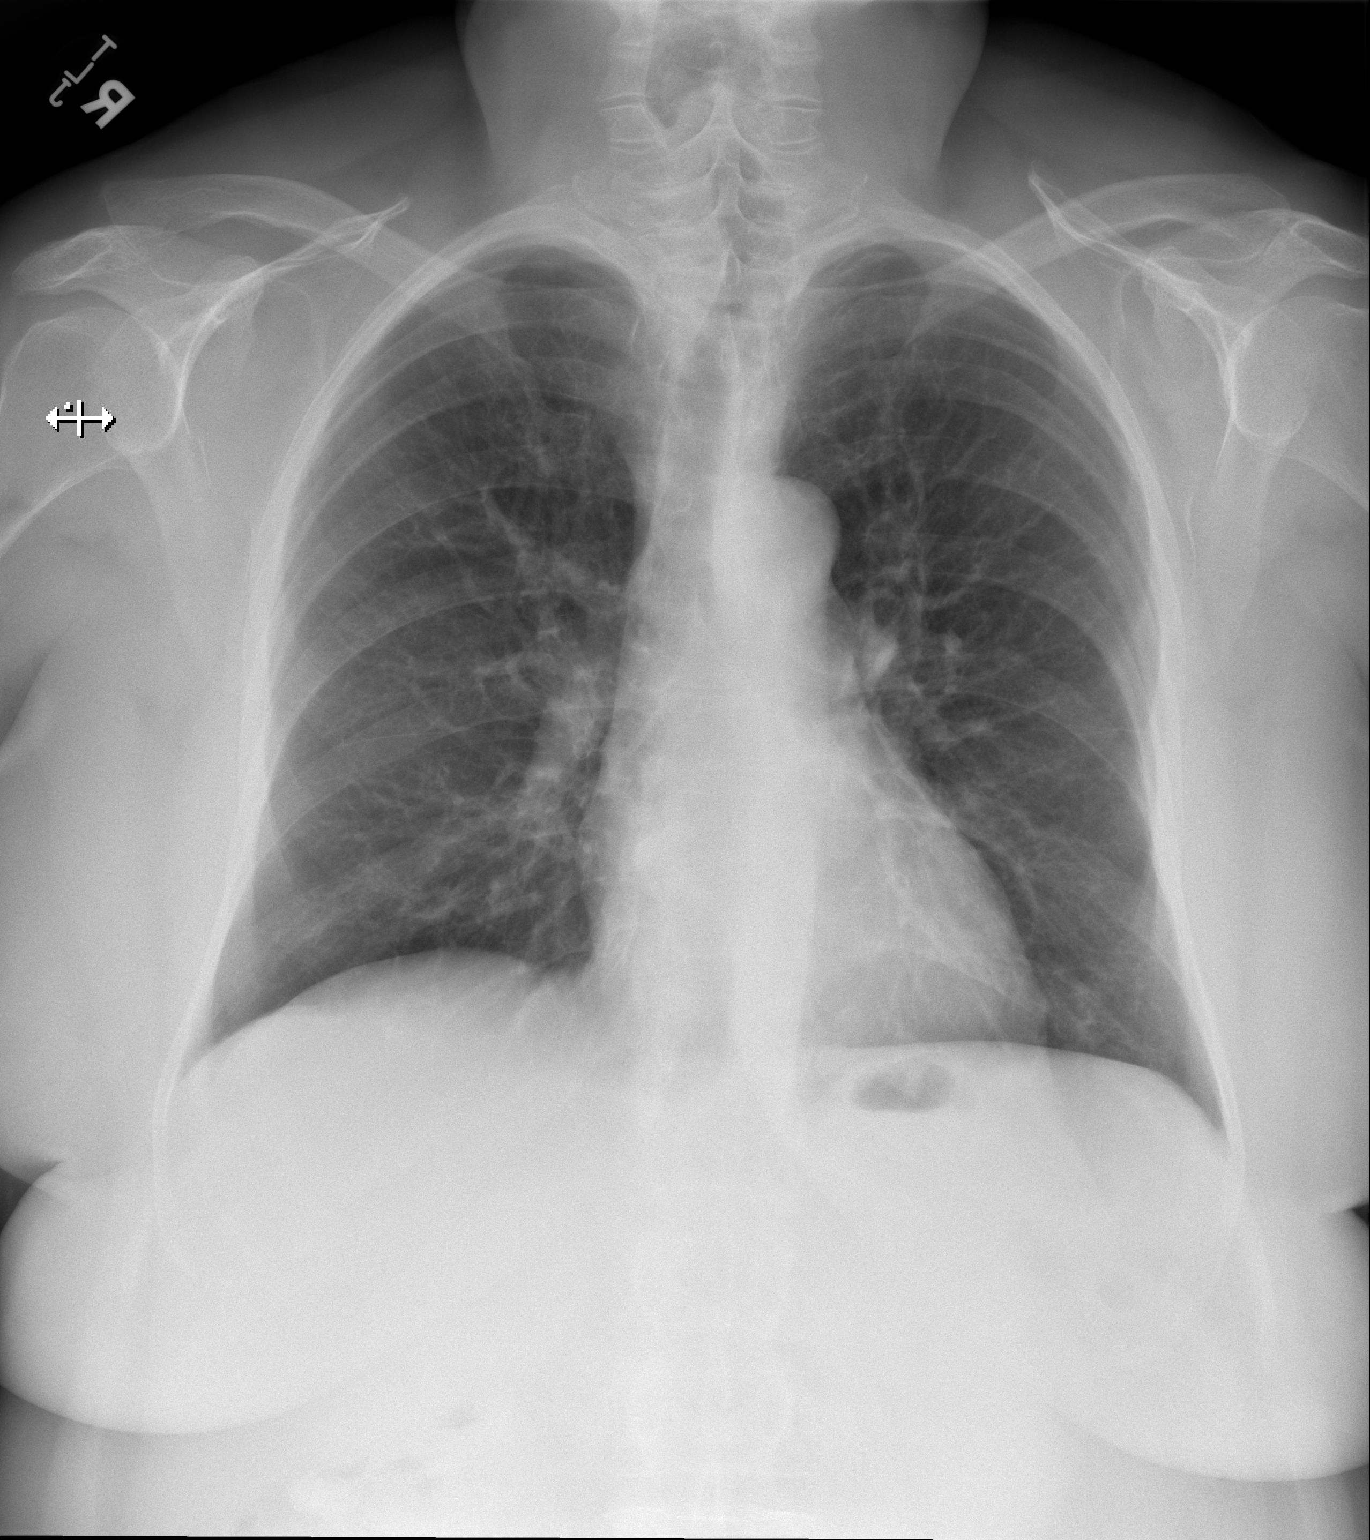

[w chest lat]
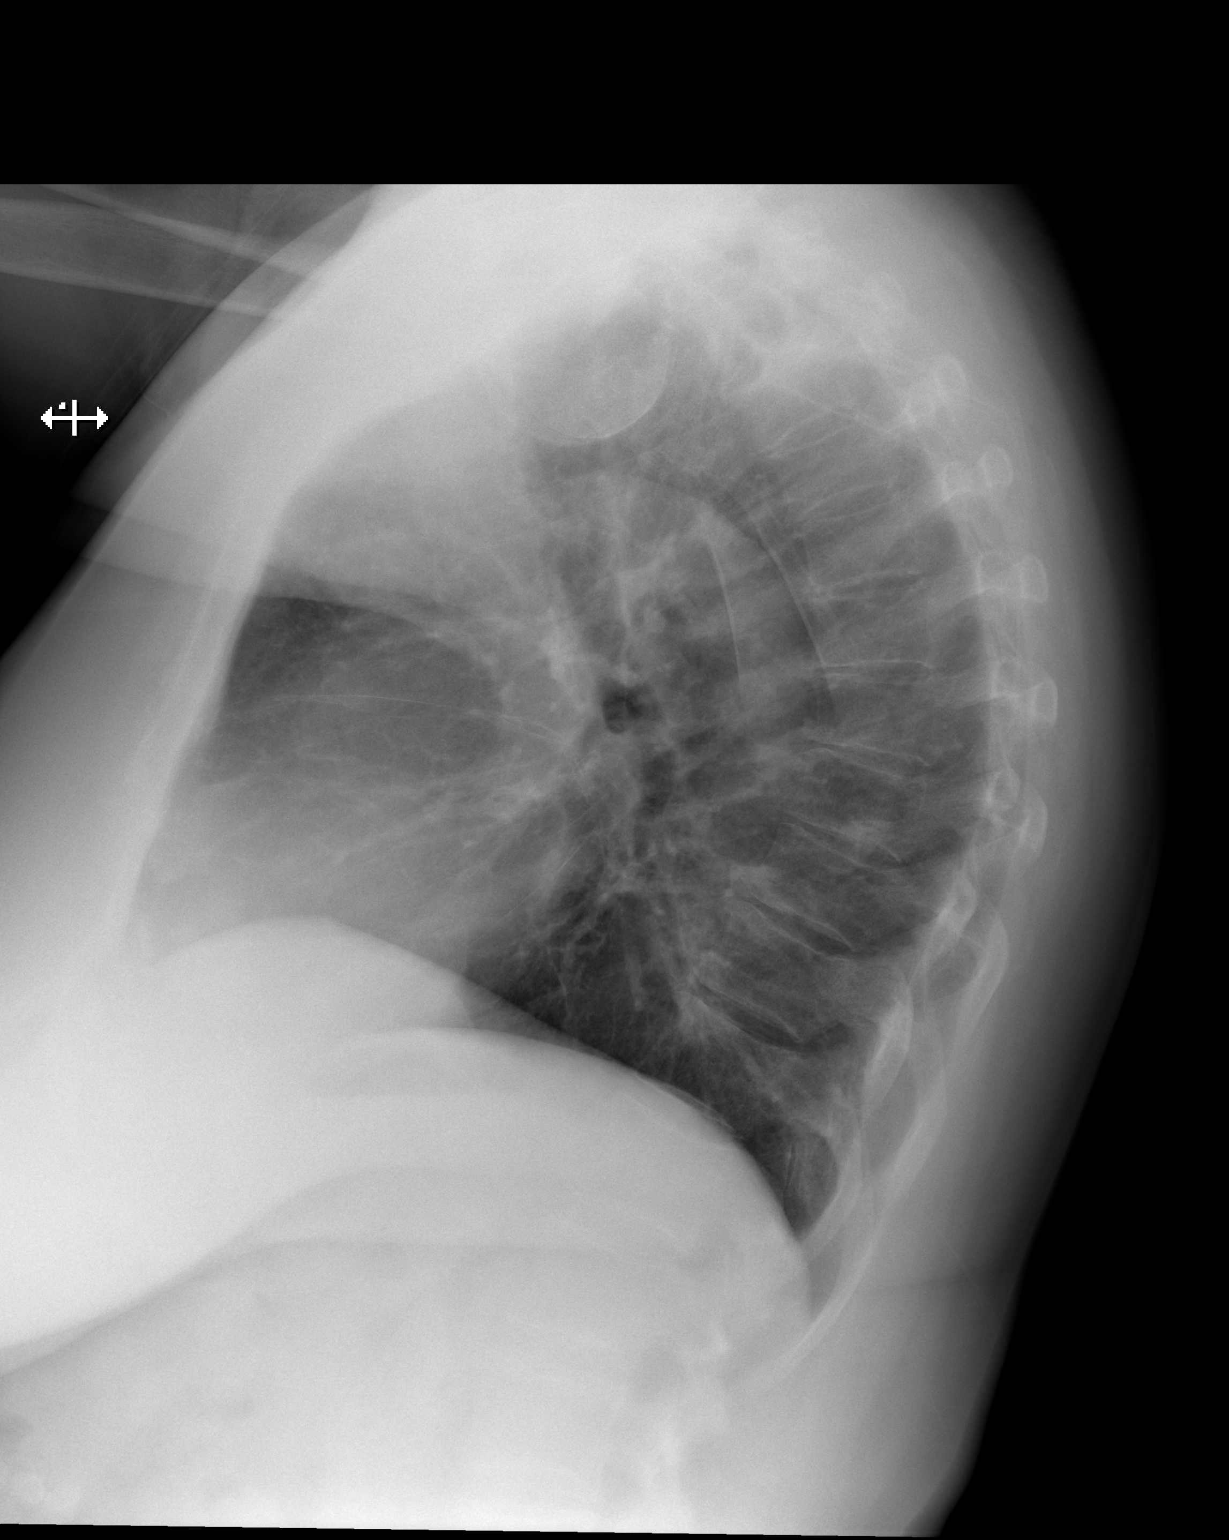

[2 of 2 positions shown; findings below may reference images not displayed]

FINDINGS: Stable cardiac and mediastinal contours. On the lateral view
inferiorly there are patchy consolidative opacities. No pleural
effusion or pneumothorax. Osseous structures unremarkable.
IMPRESSION: Lower lung patchy opacities demonstrated best on the lateral view
which may represent atelectasis or infection.

## 2022-11-12 ENCOUNTER — Ambulatory Visit
Admission: RE | Admit: 2022-11-12 | Discharge: 2022-11-12 | Disposition: A | Discharge status: Home / Self Care | Source: Ambulatory Visit | Attending: Nurse Practitioner | Admitting: Nurse Practitioner

## 2022-11-12 ENCOUNTER — Other Ambulatory Visit: Payer: Self-pay | Admitting: Nurse Practitioner

## 2022-11-12 DIAGNOSIS — R053 Chronic cough: Secondary | ICD-10-CM

## 2022-11-16 ENCOUNTER — Ambulatory Visit (INDEPENDENT_AMBULATORY_CARE_PROVIDER_SITE_OTHER): Payer: Medicare PPO | Admitting: Allergy & Immunology

## 2022-11-16 ENCOUNTER — Encounter: Payer: Self-pay | Admitting: Allergy & Immunology

## 2022-11-16 ENCOUNTER — Other Ambulatory Visit: Payer: Self-pay

## 2022-11-16 VITALS — BP 120/72 | HR 96 | Temp 98.5°F | Resp 16 | Ht 64.0 in | Wt 178.6 lb

## 2022-11-16 DIAGNOSIS — J324 Chronic pansinusitis: Secondary | ICD-10-CM | POA: Diagnosis not present

## 2022-11-16 DIAGNOSIS — J3089 Other allergic rhinitis: Secondary | ICD-10-CM

## 2022-11-16 DIAGNOSIS — J4540 Moderate persistent asthma, uncomplicated: Secondary | ICD-10-CM | POA: Diagnosis not present

## 2022-11-16 DIAGNOSIS — J329 Chronic sinusitis, unspecified: Secondary | ICD-10-CM | POA: Diagnosis not present

## 2022-11-16 DIAGNOSIS — K219 Gastro-esophageal reflux disease without esophagitis: Secondary | ICD-10-CM

## 2022-11-16 MED ORDER — SUCRALFATE 1 G PO TABS: 1.0000 g | ORAL_TABLET | Freq: Three times a day (TID) | ORAL | 0 refills | Status: DC

## 2022-11-16 MED ORDER — GUAIFENESIN-CODEINE 100-10 MG/5ML PO SOLN: 10.0000 mL | Freq: Every day | ORAL | 0 refills | Status: DC

## 2022-11-16 MED ORDER — METHYLPREDNISOLONE ACETATE 40 MG/ML IJ SUSP
40.0000 mg | Freq: Once | INTRAMUSCULAR | Status: AC
  Administered 2022-11-16: 40 mg via INTRAMUSCULAR

## 2022-11-16 NOTE — Progress Notes (Signed)
FOLLOW UP  Date of Service/Encounter:  11/16/22   Assessment:   Moderate persistent asthma without complication - with continued night time cough  Prolonged coughing illness x 2 to 3 months with bilateral maxillary sinus tenderness    Perennial allergic rhinitis  (cockroach, molds, dust mites) - on allergen immunotherapy with maintenance reached in September 2019   GERD - doing better on PPI and H2 blocker    Vaccinated to [employer] a "Naturalist" Clydie Braun)   Plan/Recommendations:   1. Moderate persistent asthma, uncomplicated - Lung testing looks very good today.  - We are going to change you to [facility] (contains three medications to help with your breathing).  - Put your Advair inhalers aside for now.  - We are going to get some labs in case we need to add on an injectable asthma medication.  - Steroid injection given today to help with the healing process.  - Daily controller medication(s): Singulair 10mg  daily + Trelegy one puff once daily - Rescue medications: albuterol 4 puffs every 4-6 hours as needed  - Asthma control goals:  * Full participation in all desired activities (may need albuterol before activity) * Albuterol use two time or less a week on average (not counting use with activity) * Cough interfering with sleep two time or less a month * Oral steroids no more than once a year * No hospitalizations  2. Chronic perennial rhinitis (cockroach, dust mite, molds) - with possible chronic sinusitis - Continue with allergy shots at the same schedule (STOPPING in September 2024).  - Anticipate five years of allergen immunotherapy (September 2024), but we will continue past this if you feel that you need it. - Start the codeine cough syrup 10 mL at night to help with coughing. - DepoMedrol injection given today. - I do not think that we need another antibiotic at this time. - We are going to get a sinus CT to see if there is something going on in your  sinuses.   3. GERD - Continue with Dexilant 60 mg daily.  - Continue with Pepcid 40mg  twice daily as well.  - Add on Carafate 3-4 times daily for two weeks.   4. Return in about 4 weeks (around 12/14/2022).   Subjective:   Brianna Santana is a 67 y.o. female presenting today for follow up of  Chief Complaint  Patient presents with   office visit    C/o having bronchitis x 2 months    Alichia Omalley has a history of the following: Patient Active Problem List   Diagnosis Date Noted   COVID-19 virus infection 02/24/2022   Moderate persistent asthma with (acute) exacerbation 02/24/2022   Trigger thumb, left thumb 03/26/2020   Stenosing tenosynovitis of wrist 03/26/2020   Perennial allergic rhinitis 12/20/2016   Cough 05/10/2016   Acute left-sided thoracic back pain 04/05/2016   Routine general medical examination at a health care facility 05/02/2015   Thyroid mass 01/16/2015   Drug allergy 07/30/2014   Crohn's disease (HCC) 03/18/2014   Osteopenia 09/11/2013   Hyperthyroidism    GERD 11/02/2007   Moderate persistent asthma without complication 10/31/2007    History obtained from: chart review and patient.  Brianna Santana is a 67 y.o. female presenting for a follow up visit. She was last seen in Apri;l 2024 by Thermon Leyland.  Time, she was continued on Wixela but increased to 250 mcg 1 puff twice daily.  She was continued on montelukast as well as albuterol.  For her allergic rhinitis, we continue with A  Since the last visit, she has continued to be sick. She has been wearing a mask since April. [place] a graduation trip. She also went to [place]. She has been diagnosed with bronchitis.  In fact, she has been diagnosed with bronchitis on a couple of occasions during this last 3 months.  She was recently placed on azithromycin and a promethazine containing cough syrup. She has been doing the breathing treatment for 3-4 times per day. She did get doxycycline  during this time from [facility]. She has had two rounds of antibiotics in total. She has not had any fever during this time.  She has had 6 negative COVID tests.  Asthma/Respiratory Symptom History: She has been doing Mucinex and she has been using albuterol. She was increased to the Advair dose. She is using this one puff twice daily.  She never took the prednisone initially after the last visit with Thurston Hole, but she did end up taking it between then and now. This did not help at all. She is just now feeling a bit better, but she is continuing to cough up phlegm.  She has never been on a biologic for her asthma.  She does have an albuterol nebulizer which she is using every 4-6 hours.  Allergic Rhinitis Symptom History: She has been off of her allergy shots.   She was previously getting 0.5 mL every 4 weeks.  She has not gotten it since she has been sick.  She has had a lot of sinus tenderness bilaterally during this whole time.  The antibiotics including the doxycycline and the azithromycin did not seem to help at all.  She has been on prednisone without much relief.  Otherwise, there have been no changes to her past medical history, surgical history, family history, or social history.    Review of Systems  Constitutional: Negative.  Negative for chills, fever, malaise/fatigue and weight loss.  HENT: Negative.  Negative for congestion, ear discharge, ear pain and sinus pain.   Eyes:  Negative for pain, discharge and redness.  Respiratory:  Negative for cough, sputum production, shortness of breath and wheezing.   Cardiovascular: Negative.  Negative for chest pain and palpitations.  Gastrointestinal:  Negative for abdominal pain, constipation, diarrhea, heartburn, nausea and vomiting.  Skin: Negative.  Negative for itching and rash.  Neurological:  Negative for dizziness and headaches.  Endo/Heme/Allergies:  Negative for environmental allergies. Does not bruise/bleed easily.        Objective:   Blood pressure 120/72, pulse 96, temperature 98.5 F (36.9 C), resp. rate 16, height 5\' 4"  (1.626 m), weight 178 lb 9.6 oz (81 kg), SpO2 98 %. Body mass index is 30.66 kg/m.    Physical Exam Vitals reviewed.  Constitutional:      Appearance: Normal appearance. She is well-developed.     Comments: Still talkative, but hoarse and coughing during visit.  HENT:     Head: Normocephalic and atraumatic.     Right Ear: Tympanic membrane, ear canal and external ear normal.     Left Ear: Tympanic membrane, ear canal and external ear normal.     Nose: No nasal deformity, septal deviation, mucosal edema or rhinorrhea.     Right Turbinates: Enlarged, swollen and pale.     Left Turbinates: Enlarged, swollen and pale.     Right Sinus: Maxillary sinus tenderness present. No frontal sinus tenderness.     Left Sinus: Maxillary  sinus tenderness present. No frontal sinus tenderness.     Comments: She does have bilateral maxillary sinus tenderness.    Mouth/Throat:     Lips: Pink.     Mouth: Mucous membranes are moist. Mucous membranes are not pale and not dry.     Pharynx: Uvula midline.     Comments: Mild cobblestoning noted. Eyes:     General: Lids are normal. Allergic shiner present.        Right eye: No discharge.        Left eye: No discharge.     Conjunctiva/sclera: Conjunctivae normal.     Right eye: Right conjunctiva is not injected. No chemosis.    Left eye: Left conjunctiva is not injected. No chemosis.    Pupils: Pupils are equal, round, and reactive to light.  Cardiovascular:     Rate and Rhythm: Normal rate and regular rhythm.     Heart sounds: Normal heart sounds.  Pulmonary:     Effort: Pulmonary effort is normal. No tachypnea, accessory muscle usage or respiratory distress.     Breath sounds: Normal breath sounds. No wheezing, rhonchi or rales.     Comments: Moving air well in all lung fields.  Chest:     Chest wall: No tenderness.  Lymphadenopathy:      Cervical: No cervical adenopathy.  Skin:    General: Skin is warm.     Capillary Refill: Capillary refill takes less than 2 seconds.     Coloration: Skin is not pale.     Findings: No abrasion, erythema, petechiae or rash. Rash is not papular, urticarial or vesicular.  Neurological:     Mental Status: She is alert.  Psychiatric:        Behavior: Behavior is cooperative.      Diagnostic studies:    Spirometry: results normal (FEV1: 2.09/106%, FVC: 2.81/112%, FEV1/FVC: 74%).    Spirometry consistent with normal pattern.  Overall, this looks excellent.  We did collect labs to look for serious causes of asthma.  Allergy Studies: none        Malachi Bonds, MD  [facility]

## 2022-11-16 NOTE — Patient Instructions (Addendum)
1. Moderate persistent asthma, uncomplicated - Lung testing looks very good today.  - We are going to change you to [facility] (contains three medications to help with your breathing).  - Put your Advair inhalers aside for now.  - We are going to get some labs in case we need to add on an injectable asthma medication.  - Steroid injection given today to help with the healing process.  - Daily controller medication(s): Singulair 10mg  daily + Trelegy one puff once daily - Rescue medications: albuterol 4 puffs every 4-6 hours as needed  - Asthma control goals:  * Full participation in all desired activities (may need albuterol before activity) * Albuterol use two time or less a week on average (not counting use with activity) * Cough interfering with sleep two time or less a month * Oral steroids no more than once a year * No hospitalizations  2. Chronic perennial rhinitis (cockroach, dust mite, molds) - with possible chronic sinusitis - Continue with allergy shots at the same schedule (STOPPING in September 2024).  - Anticipate five years of allergen immunotherapy (September 2024), but we will continue past this if you feel that you need it. - Start the codeine cough syrup 10 mL at night to help with coughing. - DepoMedrol injection given today. - I do not think that we need another antibiotic at this time. - We are going to get a sinus CT to see if there is something going on in your sinuses.   3. GERD - Continue with Dexilant 60 mg daily.  - Continue with Pepcid 40mg  twice daily as well.   4. Return in about 4 weeks (around 12/14/2022).    Please inform us of any Emergency Department visits, hospitalizations, or changes in symptoms. Call us before going to the ED for breathing or allergy symptoms since we might be able to fit you in for a sick visit. Feel free to contact us anytime with any questions, problems, or concerns.  It was a pleasure to see you again today!    Websites  that have reliable patient information: 1. American Academy of Asthma, Allergy, and Immunology: www.aaaai.org 2. Food Allergy Research and Education (FARE): foodallergy.org 3. Mothers of Asthmatics: http://www.asthmacommunitynetwork.org 4. American College of Allergy, Asthma, and Immunology: www.acaai.org   COVID-19 Vaccine Information can be found at: PodExchange.nl For questions related to vaccine distribution or appointments, please email [email or web address] or call [phone].   We realize that you might be concerned about having an allergic reaction to the COVID19 vaccines. To help with that concern, WE ARE OFFERING THE COVID19 VACCINES IN OUR OFFICE! Ask the front desk for dates!     "Like" [name] on Facebook and Instagram for our latest updates!      A healthy democracy works best when [employer] participate! Make sure you are registered to vote! If you have moved or changed any of your contact information, you will need to get this updated before voting!  In some cases, you MAY be able to register to vote online: [email or web address]

## 2022-11-17 ENCOUNTER — Other Ambulatory Visit: Payer: Self-pay | Admitting: Internal Medicine

## 2022-11-17 ENCOUNTER — Telehealth: Payer: Self-pay | Admitting: *Deleted

## 2022-11-17 LAB — ANCA TITERS

## 2022-11-17 MED ORDER — GUAIFENESIN-CODEINE 100-10 MG/5ML PO SOLN: 10.0000 mL | Freq: Every day | ORAL | 0 refills | Status: DC

## 2022-11-17 NOTE — Telephone Encounter (Signed)
-----   Message from Brianna Spruce, MD sent at 11/16/2022 12:32 PM EDT ----- Sinus CT ordered. Unsure if there is a PA required. Patient said she already scheduled it.

## 2022-11-17 NOTE — Telephone Encounter (Signed)
I did research and since the patient has CHAMPVA as primary, it does not require Prior Authorization for Imaging. I noticed that they also stated that in her appointment note that she has CHAMPVA. She should be good to go.

## 2022-11-17 NOTE — Progress Notes (Signed)
Patient returned to clinic today with unsigned copy of medication printed by her allergist yesterday.  Spoke with patient's allergist Dr. Dellis Anes who is not in clinic and asking if another provider could print and sign this medication for the patient. Medication guaifenesin-codeine 100-10 mg/58mL syrup-take 10 mg nightly as needed for cough.   Prescription printed and signed for patient.

## 2022-11-18 LAB — ASPERGILLUS PRECIPITINS

## 2022-11-19 ENCOUNTER — Ambulatory Visit
Admission: RE | Admit: 2022-11-19 | Discharge: 2022-11-19 | Disposition: A | Discharge status: Home / Self Care | Source: Ambulatory Visit | Attending: Allergy & Immunology | Admitting: Allergy & Immunology

## 2022-11-19 DIAGNOSIS — J324 Chronic pansinusitis: Secondary | ICD-10-CM

## 2022-11-19 LAB — ASPERGILLUS PRECIPITINS
Aspergillus Flavus Antibodies: NEGATIVE
Aspergillus Niger Antibodies: NEGATIVE
Aspergillus glaucus IgG: NEGATIVE
Aspergillus nidulans IgG: NEGATIVE
Aspergillus terreus IgG: NEGATIVE

## 2022-11-19 LAB — IGE: IgE (Immunoglobulin E), Serum: 26 IU/mL (ref 6–495)

## 2022-11-19 LAB — ANCA TITERS
C-ANCA: <1:20 {titer}
P-ANCA: <1:20 {titer}

## 2022-11-19 LAB — ALPHA-1-ANTITRYPSIN: A-1 Antitrypsin: 165 mg/dL (ref 101–187)

## 2022-11-26 ENCOUNTER — Telehealth: Payer: Self-pay

## 2022-11-26 ENCOUNTER — Ambulatory Visit (INDEPENDENT_AMBULATORY_CARE_PROVIDER_SITE_OTHER): Payer: Medicare PPO

## 2022-11-26 DIAGNOSIS — J309 Allergic rhinitis, unspecified: Secondary | ICD-10-CM

## 2022-11-26 NOTE — Telephone Encounter (Signed)
Pt wanted a refill of guaifenesin-codeine oral solution advise to rx for pt? Last refill was sent in 11/17/22

## 2022-11-27 ENCOUNTER — Other Ambulatory Visit: Payer: Self-pay | Admitting: Allergy & Immunology

## 2022-11-29 NOTE — Telephone Encounter (Signed)
I called the patient to follow up with her symptoms and she is still having issues with coughing. It is a constant issues for her day and night. Her flem is no longer clear and is now yellow/brown. She has pressure/ pain in her left ear from all the coughing.   She got her allergy shot last Friday and would like to know if she can come in on 12/01/22 for her next injection.   I did inform her that Dr. Dellis Anes has to sign for the cough syrup and will be notified when it is sent in. I also went over CT Scan with the patient.

## 2022-11-29 NOTE — Telephone Encounter (Signed)
I cannot do this until I am back in the [place] office on Tuesday since I have to sign the script.   Malachi Bonds, MD [facility]

## 2022-11-30 MED ORDER — GUAIFENESIN-CODEINE 100-10 MG/5ML PO SOLN: 10.0000 mL | Freq: Three times a day (TID) | ORAL | 0 refills | Status: DC | PRN

## 2022-11-30 NOTE — Addendum Note (Signed)
Addended by: Alfonse Spruce on: 11/30/2022 05:20 PM   Modules accepted: Orders

## 2022-11-30 NOTE — Telephone Encounter (Signed)
Spoke with pt advise to pick up prescription from office in [address] pt verbalized understanding and will come tomorrow.

## 2022-11-30 NOTE — Telephone Encounter (Signed)
Signed script.   Malachi Bonds, MD [facility]

## 2022-11-30 NOTE — Telephone Encounter (Signed)
Patient wanted to know if Dr. Dellis Anes signed prescription for her cough medicine. Patient is requesting this get done as soon as possible.   Advised patient Dr. Dellis Anes is seeing patients this morning and would sign it once he does get a moment. Advised patient our office would call her once prescription is signed.  Patient verbalized understanding.   Best contact number: [phone]

## 2022-12-16 ENCOUNTER — Ambulatory Visit (INDEPENDENT_AMBULATORY_CARE_PROVIDER_SITE_OTHER): Payer: Medicare PPO | Admitting: Allergy & Immunology

## 2022-12-16 ENCOUNTER — Encounter: Payer: Self-pay | Admitting: Allergy & Immunology

## 2022-12-16 ENCOUNTER — Other Ambulatory Visit: Payer: Self-pay

## 2022-12-16 VITALS — BP 102/64 | HR 94 | Temp 98.4°F | Ht 64.0 in | Wt 180.8 lb

## 2022-12-16 DIAGNOSIS — K219 Gastro-esophageal reflux disease without esophagitis: Secondary | ICD-10-CM | POA: Diagnosis not present

## 2022-12-16 DIAGNOSIS — J309 Allergic rhinitis, unspecified: Secondary | ICD-10-CM

## 2022-12-16 DIAGNOSIS — J3089 Other allergic rhinitis: Secondary | ICD-10-CM | POA: Diagnosis not present

## 2022-12-16 DIAGNOSIS — J4540 Moderate persistent asthma, uncomplicated: Secondary | ICD-10-CM

## 2022-12-16 MED ORDER — PROMETHAZINE-DM 6.25-15 MG/5ML PO SYRP: 5.0000 mL | ORAL_SOLUTION | Freq: Four times a day (QID) | ORAL | 1 refills | Status: DC | PRN

## 2022-12-16 MED ORDER — TRELEGY ELLIPTA 200-62.5-25 MCG/ACT IN AEPB: 1.0000 | INHALATION_SPRAY | Freq: Every day | RESPIRATORY_TRACT | 5 refills | Status: DC

## 2022-12-16 NOTE — Progress Notes (Signed)
FOLLOW UP  Date of Service/Encounter:  12/16/22   Assessment:   Moderate persistent asthma without complication - with continued night time cough   Prolonged coughing illness x 2 to 3 months with bilateral maxillary sinus tenderness    Perennial allergic rhinitis  (cockroach, molds, dust mites) - on allergen immunotherapy with maintenance reached in September 2019   GERD - doing better on PPI and H2 blocker    Vaccinated to [employer] a "Naturalist" Brianna Santana)     Plan/Recommendations:   1. Moderate persistent asthma, uncomplicated - Lung testing looks very BEAUTIFUL today.  - We are doing to send in Trelegy to see if this is covered.  - We may start Xolair in the future (information provided).  - Steroid injection given today to help with the healing process.  - Daily controller medication(s): Singulair 10mg  daily + Trelegy one puff once daily - Rescue medications: albuterol 4 puffs every 4-6 hours as needed  - Asthma control goals:  * Full participation in all desired activities (may need albuterol before activity) * Albuterol use two time or less a week on average (not counting use with activity) * Cough interfering with sleep two time or less a month * Oral steroids no more than once a year * No hospitalizations  2. Chronic perennial rhinitis (cockroach, dust mite, molds) - with possible chronic sinusitis - Continue with allergy shots at the same schedule (STOPPING in September 2024).  - Anticipate five years of allergen immunotherapy (September 2024), but we will continue past this if you feel that you need it. - I am writing a new script for the cough medicine. - Sinus CT was normal.  - Maybe this just needs time to clear up.  3. GERD - Continue with Dexilant 60 mg daily.  - Continue with Pepcid 40mg  twice daily as well.   4. Return in about 6 months (around 06/18/2023).    Subjective:   Brianna Santana is a 67 y.o. female presenting today  for follow up of  Chief Complaint  Patient presents with   Follow-up   Cough    [name] has a history of the following: Patient Active Problem List   Diagnosis Date Noted   COVID-19 virus infection 02/24/2022   Moderate persistent asthma with (acute) exacerbation 02/24/2022   Trigger thumb, left thumb 03/26/2020   Stenosing tenosynovitis of wrist 03/26/2020   Perennial allergic rhinitis 12/20/2016   Cough 05/10/2016   Acute left-sided thoracic back pain 04/05/2016   Routine general medical examination at a health care facility 05/02/2015   Thyroid mass 01/16/2015   Drug allergy 07/30/2014   Crohn's disease (HCC) 03/18/2014   Osteopenia 09/11/2013   Hyperthyroidism    GERD 11/02/2007   Moderate persistent asthma without complication 10/31/2007    History obtained from: chart review and patient.  Brianna Santana is a 67 y.o. female presenting for a follow up visit.  She was last seen in June 2024.  At that time, her lung testing looked great.  We changed her to Trelegy 1 puff once daily and continue with Singulair.  For her rhinitis, we talked about stopping allergy shots in September 2024.  We started her on a codeine cough syrup to help with the coughing and gave her a Depo-Medrol injection.  We also obtained a sinus CT.  Her GERD was not under good control.  We continue with Dexilant and Pepcid.  We added on Carafate.  Her sinus CT was unremarkable.  Asthma/Respiratory Symptom History: She continues to cough. She has been using the cough medicine that I prescribed at night and then she has another cough medicine continue promethazine and dextromethorphan from her PCP which she uses during the day.  She is requesting a refill of the promethazine and dextromethorphan cough medicine.  She did do the Trelegy for 2 weeks which is what the sample provided her.  Apparently never sent today and and she never called [name], so she went back to her old controller medicine.  She did like  the Trelegy and felt that it worked very well.  The coughing really is more of a bother in the middle of the night.  Otherwise, she actually is doing pretty good.  We discussed the addition of a biologic, but she is not sure she wants to do that at this time.  We obtained labs at the last visit, but the CBC was not collected, likely because I ordered it incorrectly.  She is open to getting it today to see where things are.  Her asthma has just not been under good control since she had COVID  Allergic Rhinitis Symptom History: She is interested in restarting her allergy shots.  We were going to finish her current vial and then be done with allergy shots since it has been 5 years.  She has not been getting her allergy shots recently because of the cough.  She is wondering if it is okay for her to get on now.  GERD Symptom History: Her reflux is under fair control with the Dexilant and the Pepcid.  Otherwise, there have been no changes to her past medical history, surgical history, family history, or social history.    Review of systems otherwise negative other than that mentioned in the HPI.    Objective:   Blood pressure 102/64, pulse 94, temperature 98.4 F (36.9 C), height 5\' 4"  (1.626 m), weight 180 lb 12.8 oz (82 kg), SpO2 95%. Body mass index is 31.03 kg/m.    Physical Exam Vitals reviewed.  Constitutional:      Appearance: Normal appearance. She is well-developed.     Comments: Still talkative, but hoarse and coughing during visit.  HENT:     Head: Normocephalic and atraumatic.     Right Ear: Tympanic membrane, ear canal and external ear normal.     Left Ear: Tympanic membrane, ear canal and external ear normal.     Nose: No nasal deformity, septal deviation, mucosal edema or rhinorrhea.     Right Turbinates: Enlarged. Not swollen or pale.     Left Turbinates: Enlarged. Not swollen or pale.     Right Sinus: No maxillary sinus tenderness or frontal sinus tenderness.     Left  Sinus: No maxillary sinus tenderness or frontal sinus tenderness.     Comments: She does have bilateral maxillary sinus tenderness.    Mouth/Throat:     Lips: Pink.     Mouth: Mucous membranes are moist. Mucous membranes are not pale and not dry.     Pharynx: Uvula midline.     Comments: Mild cobblestoning noted. Eyes:     General: Lids are normal. Allergic shiner present.        Right eye: No discharge.        Left eye: No discharge.     Conjunctiva/sclera: Conjunctivae normal.     Right eye: Right conjunctiva is not injected. No chemosis.    Left eye: Left conjunctiva is not injected. No chemosis.  Pupils: Pupils are equal, round, and reactive to light.  Cardiovascular:     Rate and Rhythm: Normal rate and regular rhythm.     Heart sounds: Normal heart sounds.  Pulmonary:     Effort: Pulmonary effort is normal. No tachypnea, accessory muscle usage or respiratory distress.     Breath sounds: Normal breath sounds. No wheezing, rhonchi or rales.     Comments: Coarse upper airway sounds throughout.  Chest:     Chest wall: No tenderness.  Lymphadenopathy:     Cervical: No cervical adenopathy.  Skin:    General: Skin is warm.     Capillary Refill: Capillary refill takes less than 2 seconds.     Coloration: Skin is not pale.     Findings: No abrasion, erythema, petechiae or rash. Rash is not papular, urticarial or vesicular.  Neurological:     Mental Status: She is alert.  Psychiatric:        Behavior: Behavior is cooperative.      Diagnostic studies:    Spirometry: results normal (FEV1: 2.03/103%, FVC: 2.49/99%, FEV1/FVC: 82%). Spirometry consistent with normal pattern.    Allergy Studies: none       Malachi Bonds, MD  [facility]      /

## 2022-12-16 NOTE — Patient Instructions (Addendum)
1. Moderate persistent asthma, uncomplicated - Lung testing looks very BEAUTIFUL today.  - We are doing to send in Trelegy to see if this is covered.  - We may start Xolair in the future (information provided).  - Daily controller medication(s): Singulair 10mg  daily + Trelegy one puff once daily - Rescue medications: albuterol 4 puffs every 4-6 hours as needed  - Asthma control goals:  * Full participation in all desired activities (may need albuterol before activity) * Albuterol use two time or less a week on average (not counting use with activity) * Cough interfering with sleep two time or less a month * Oral steroids no more than once a year * No hospitalizations  2. Chronic perennial rhinitis (cockroach, dust mite, molds) - with possible chronic sinusitis - Continue with allergy shots at the same schedule (STOPPING in September 2024).  - Anticipate five years of allergen immunotherapy (September 2024), but we will continue past this if you feel that you need it. - I am writing a new script for the cough medicine. - Sinus CT was normal.  - Maybe this just needs time to clear up.  3. GERD - Continue with Dexilant 60 mg daily.  - Continue with Pepcid 40mg  twice daily as well.   4. Return in about 6 months (around 06/18/2023).    Please inform us of any Emergency Department visits, hospitalizations, or changes in symptoms. Call us before going to the ED for breathing or allergy symptoms since we might be able to fit you in for a sick visit. Feel free to contact us anytime with any questions, problems, or concerns.  It was a pleasure to see you again today!    Websites that have reliable patient information: 1. American Academy of Asthma, Allergy, and Immunology: www.aaaai.org 2. Food Allergy Research and Education (FARE): foodallergy.org 3. Mothers of Asthmatics: http://www.asthmacommunitynetwork.org 4. American College of Allergy, Asthma, and Immunology:  www.acaai.org   COVID-19 Vaccine Information can be found at: [email or web address] For questions related to vaccine distribution or appointments, please email [email or web address] or call [phone].   We realize that you might be concerned about having an allergic reaction to the COVID19 vaccines. To help with that concern, WE ARE OFFERING THE COVID19 VACCINES IN OUR OFFICE! Ask the front desk for dates!     "Like" [name] on Facebook and Instagram for our latest updates!      A healthy democracy works best when [employer] participate! Make sure you are registered to vote! If you have moved or changed any of your contact information, you will need to get this updated before voting!  In some cases, you MAY be able to register to vote online: [email or web address]

## 2022-12-17 LAB — CBC WITH DIFFERENTIAL/PLATELET
Basophils Absolute: 0.1 10*3/uL (ref 0.0–0.2)
Basos: 1 %
EOS (ABSOLUTE): 0.3 10*3/uL (ref 0.0–0.4)
Eos: 7 %
Hematocrit: 37 % (ref 34.0–46.6)
Hemoglobin: 12.5 g/dL (ref 11.1–15.9)
Immature Grans (Abs): 0 10*3/uL (ref 0.0–0.1)
Immature Granulocytes: 0 %
Lymphocytes Absolute: 2.3 10*3/uL (ref 0.7–3.1)
Lymphs: 51 %
MCH: 30.4 pg (ref 26.6–33.0)
MCHC: 33.8 g/dL (ref 31.5–35.7)
MCV: 90 fL (ref 79–97)
Monocytes Absolute: 0.4 10*3/uL (ref 0.1–0.9)
Monocytes: 9 %
Neutrophils Absolute: 1.5 10*3/uL (ref 1.4–7.0)
Neutrophils: 32 %
Platelets: 294 10*3/uL (ref 150–450)
RBC: 4.11 x10E6/uL (ref 3.77–5.28)
RDW: 12.8 % (ref 11.7–15.4)
WBC: 4.5 10*3/uL (ref 3.4–10.8)

## 2022-12-17 NOTE — Addendum Note (Signed)
Addended by: Kellie Simmering, Deloma Spindle on: 12/17/2022 05:18 PM   Modules accepted: Orders

## 2022-12-20 ENCOUNTER — Ambulatory Visit (INDEPENDENT_AMBULATORY_CARE_PROVIDER_SITE_OTHER): Payer: Medicare PPO

## 2022-12-20 ENCOUNTER — Telehealth: Payer: Self-pay | Admitting: *Deleted

## 2022-12-20 DIAGNOSIS — J309 Allergic rhinitis, unspecified: Secondary | ICD-10-CM | POA: Diagnosis not present

## 2022-12-20 NOTE — Telephone Encounter (Signed)
-----   Message from Alfonse Spruce sent at 12/20/2022 10:35 AM EDT ----- MyChart message sent. [name] - can we submit for whatever [facility] is covering these days? Harrington Challenger?

## 2022-12-20 NOTE — Telephone Encounter (Signed)
Called patient to discuss Harrington Challenger from [facility] but she advised she wants to think about it and call me back if she decides to proceed

## 2022-12-21 NOTE — Telephone Encounter (Signed)
Great thank you!

## 2022-12-28 ENCOUNTER — Ambulatory Visit: Payer: Self-pay

## 2022-12-28 ENCOUNTER — Ambulatory Visit: Payer: Medicare PPO | Admitting: Allergy & Immunology

## 2022-12-28 DIAGNOSIS — J309 Allergic rhinitis, unspecified: Secondary | ICD-10-CM | POA: Diagnosis not present

## 2023-01-04 ENCOUNTER — Ambulatory Visit (INDEPENDENT_AMBULATORY_CARE_PROVIDER_SITE_OTHER): Payer: Medicare PPO

## 2023-01-04 DIAGNOSIS — J309 Allergic rhinitis, unspecified: Secondary | ICD-10-CM

## 2023-01-12 ENCOUNTER — Ambulatory Visit (INDEPENDENT_AMBULATORY_CARE_PROVIDER_SITE_OTHER): Payer: Medicare PPO | Admitting: *Deleted

## 2023-01-12 DIAGNOSIS — J309 Allergic rhinitis, unspecified: Secondary | ICD-10-CM

## 2023-01-17 ENCOUNTER — Other Ambulatory Visit: Payer: Self-pay | Admitting: Orthopedic Surgery

## 2023-01-17 DIAGNOSIS — Z6830 Body mass index (BMI) 30.0-30.9, adult: Secondary | ICD-10-CM

## 2023-01-17 DIAGNOSIS — M47816 Spondylosis without myelopathy or radiculopathy, lumbar region: Secondary | ICD-10-CM

## 2023-01-17 DIAGNOSIS — M5030 Other cervical disc degeneration, unspecified cervical region: Secondary | ICD-10-CM

## 2023-02-10 ENCOUNTER — Ambulatory Visit (INDEPENDENT_AMBULATORY_CARE_PROVIDER_SITE_OTHER): Payer: Medicare PPO

## 2023-02-10 DIAGNOSIS — J309 Allergic rhinitis, unspecified: Secondary | ICD-10-CM

## 2023-03-07 ENCOUNTER — Other Ambulatory Visit: Payer: Self-pay | Admitting: Family Medicine

## 2023-03-07 DIAGNOSIS — G44309 Post-traumatic headache, unspecified, not intractable: Secondary | ICD-10-CM

## 2023-03-08 ENCOUNTER — Ambulatory Visit (INDEPENDENT_AMBULATORY_CARE_PROVIDER_SITE_OTHER): Payer: Medicare PPO | Admitting: *Deleted

## 2023-03-08 DIAGNOSIS — J309 Allergic rhinitis, unspecified: Secondary | ICD-10-CM

## 2023-03-17 ENCOUNTER — Ambulatory Visit
Admission: RE | Admit: 2023-03-17 | Discharge: 2023-03-17 | Disposition: A | Discharge status: Home / Self Care | Source: Ambulatory Visit | Attending: Orthopedic Surgery | Admitting: Orthopedic Surgery

## 2023-03-17 ENCOUNTER — Ambulatory Visit
Admission: RE | Admit: 2023-03-17 | Discharge: 2023-03-17 | Disposition: A | Discharge status: Home / Self Care | Source: Ambulatory Visit | Attending: Family Medicine | Admitting: Family Medicine

## 2023-03-17 DIAGNOSIS — G44309 Post-traumatic headache, unspecified, not intractable: Secondary | ICD-10-CM

## 2023-03-17 DIAGNOSIS — Z6830 Body mass index (BMI) 30.0-30.9, adult: Secondary | ICD-10-CM

## 2023-03-17 DIAGNOSIS — M47816 Spondylosis without myelopathy or radiculopathy, lumbar region: Secondary | ICD-10-CM

## 2023-03-17 DIAGNOSIS — M5030 Other cervical disc degeneration, unspecified cervical region: Secondary | ICD-10-CM

## 2023-03-28 ENCOUNTER — Other Ambulatory Visit

## 2023-04-01 ENCOUNTER — Telehealth: Payer: Self-pay | Admitting: Gastroenterology

## 2023-04-01 DIAGNOSIS — K5010 Crohn's disease of large intestine without complications: Secondary | ICD-10-CM

## 2023-04-01 MED ORDER — HUMIRA (2 PEN) 40 MG/0.4ML ~~LOC~~ AJKT: 40.0000 mg | AUTO-INJECTOR | SUBCUTANEOUS | 0 refills | Status: DC

## 2023-04-01 NOTE — Telephone Encounter (Signed)
Inbound call from patient requesting refill for Humira pen. Thank you.

## 2023-04-01 NOTE — Telephone Encounter (Signed)
Prescription sent to patient's pharmacy.

## 2023-05-26 ENCOUNTER — Other Ambulatory Visit: Payer: Self-pay | Admitting: Family Medicine

## 2023-05-26 DIAGNOSIS — G500 Trigeminal neuralgia: Secondary | ICD-10-CM

## 2023-06-21 ENCOUNTER — Ambulatory Visit (INDEPENDENT_AMBULATORY_CARE_PROVIDER_SITE_OTHER): Payer: Medicare PPO | Admitting: Allergy & Immunology

## 2023-06-21 ENCOUNTER — Encounter: Payer: Self-pay | Admitting: Allergy & Immunology

## 2023-06-21 ENCOUNTER — Other Ambulatory Visit: Payer: Self-pay

## 2023-06-21 VITALS — BP 130/68 | HR 84 | Temp 98.2°F | Resp 12

## 2023-06-21 DIAGNOSIS — J3089 Other allergic rhinitis: Secondary | ICD-10-CM

## 2023-06-21 DIAGNOSIS — K219 Gastro-esophageal reflux disease without esophagitis: Secondary | ICD-10-CM | POA: Diagnosis not present

## 2023-06-21 DIAGNOSIS — J4540 Moderate persistent asthma, uncomplicated: Secondary | ICD-10-CM | POA: Diagnosis not present

## 2023-06-21 MED ORDER — TRELEGY ELLIPTA 200-62.5-25 MCG/ACT IN AEPB: 1.0000 | INHALATION_SPRAY | Freq: Every day | RESPIRATORY_TRACT | Status: DC

## 2023-06-21 MED ORDER — AZELASTINE HCL 0.1 % NA SOLN: NASAL | 1 refills | Status: DC

## 2023-06-21 MED ORDER — TRELEGY ELLIPTA 200-62.5-25 MCG/ACT IN AEPB: 1.0000 | INHALATION_SPRAY | Freq: Every day | RESPIRATORY_TRACT | 5 refills | Status: DC

## 2023-06-21 NOTE — Progress Notes (Unsigned)
FOLLOW UP  Date of Service/Encounter:  06/21/23   Assessment:   Moderate persistent asthma without complication - with continued night time cough   Prolonged coughing illness x 2 to 3 months with bilateral maxillary sinus tenderness    Perennial allergic rhinitis  (cockroach, molds, dust mites) - on allergen immunotherapy with maintenance reached in September 2019   GERD - doing better on PPI and H2 blocker    Vaccinated to [employer] a "Naturalist" Clydie Braun)   Plan/Recommendations:   Assessment and Plan              Patient Instructions  1. Moderate persistent asthma, uncomplicated - Lung testing looks consistent with some air trapping, but since you are feeling fine, I think we are in a good spot.   - Symptoms are definitely better than it was in the past.  - We will send in the prescription for the Trelegy and do this one puff once daily EVERY DAY!  - Daily controller medication(s): Singulair 10mg  daily + Trelegy one puff once daily - Rescue medications: albuterol 4 puffs every 4-6 hours as needed  - Asthma control goals:  * Full participation in all desired activities (may need albuterol before activity) * Albuterol use two time or less a week on average (not counting use with activity) * Cough interfering with sleep two time or less a month * Oral steroids no more than once a year * No hospitalizations  2. Chronic perennial rhinitis (cockroach, dust mite, molds) - completed 5 years maintenance  - Continue with Astelin 1-2 sprays up to twice daily.  3. GERD - Continue with Dexilant 60 mg daily.  - Continue with Pepcid 40mg  twice daily as well.  4. Return in about 3 months (around 09/19/2023). You can have the follow up appointment with Dr. Dellis Anes or a Nurse Practicioner (our Nurse Practitioners are excellent and always have Physician oversight!).    Please inform us of any Emergency Department visits, hospitalizations, or changes in symptoms.  Call us before going to the ED for breathing or allergy symptoms since we might be able to fit you in for a sick visit. Feel free to contact us anytime with any questions, problems, or concerns.  It was a pleasure to see you again today!  Websites that have reliable patient information: 1. American Academy of Asthma, Allergy, and Immunology: www.aaaai.org 2. Food Allergy Research and Education (FARE): foodallergy.org 3. Mothers of Asthmatics: http://www.asthmacommunitynetwork.org 4. American College of Allergy, Asthma, and Immunology: www.acaai.org      "Like" [name] on Facebook and Instagram for our latest updates!      A healthy democracy works best when [employer] participate! Make sure you are registered to vote! If you have moved or changed any of your contact information, you will need to get this updated before voting! Scan the QR codes below to learn more!        Medication Samples have been provided to the patient.  Drug name: Trellegy       Strength: 230mcg/62.30mcg/25mcg        Qty: 28 each  LOT: [id number]  Exp.Date: 12/2023  Dosing instructions: One Puff Once Daily.  The patient has been instructed regarding the correct time, dose, and frequency of taking this medication, including desired effects and most common side effects.   Genia Harold 4:10 PM 06/21/2023      Subjective:   Shawda Channing is a 68 y.o. female presenting today for follow up  of  Chief Complaint  Patient presents with  . Asthma  . Allergies    Celisa Iridiana Pilson has a history of the following: Patient Active Problem List   Diagnosis Date Noted  . COVID-19 virus infection 02/24/2022  . Moderate persistent asthma with (acute) exacerbation 02/24/2022  . Trigger thumb, left thumb 03/26/2020  . Stenosing tenosynovitis of wrist 03/26/2020  . Perennial allergic rhinitis 12/20/2016  . Cough 05/10/2016  . Acute left-sided thoracic back pain 04/05/2016  . Routine general medical  examination at a health care facility 05/02/2015  . Thyroid mass 01/16/2015  . Drug allergy 07/30/2014  . Crohn's disease (HCC) 03/18/2014  . Osteopenia 09/11/2013  . Hyperthyroidism   . GERD 11/02/2007  . Moderate persistent asthma without complication 10/31/2007    History obtained from: chart review and {Persons; PED relatives w/patient:19415::"patient"}.  Discussed the use of AI scribe software for clinical note transcription with the patient and/or guardian, who gave verbal consent to proceed.  Josephina is a 68 y.o. female presenting for {Blank single:19197::"a food challenge","a drug challenge","skin testing","a sick visit","an evaluation of ***","a follow up visit"}.  She was last seen in July 2024.  At that time, her lung testing looked beautiful.  We sent in Trelegy to see if this was covered at all.  We gave her a steroid injection and talked about Xolair for her in the future.  For her rhinitis, we continued with her allergy shots through September 2024.  We did refill her cough medicine.  The sinus CT had been normal.  For her GERD, we continue with Dexilant as well as Pepcid.  Since last visit,  Discussed the use of AI scribe software for clinical note transcription with the patient, who gave verbal consent to proceed.  History of Present Illness            Asthma/Respiratory Symptom History: ***  Allergic Rhinitis Symptom History: ***  Food Allergy Symptom History: ***  Skin Symptom History: ***  GERD Symptom History: ***  Infection Symptom History: ***  Otherwise, there have been no changes to her past medical history, surgical history, family history, or social history.    Review of systems otherwise negative other than that mentioned in the HPI.    Objective:   Blood pressure 130/68, pulse 84, temperature 98.2 F (36.8 C), temperature source Temporal, resp. rate 12, SpO2 95%. There is no height or weight on file to calculate BMI.    Physical Exam    Diagnostic studies:    Spirometry: results normal (FEV1: 1.82/93%, FVC: 3.56/142%, FEV1/FVC: 51%).    Spirometry consistent with mild obstructive disease. {Blank single:19197::"Albuterol/Atrovent nebulizer","Xopenex/Atrovent nebulizer","Albuterol nebulizer","Albuterol four puffs via MDI","Xopenex four puffs via MDI"} treatment given in clinic with {Blank single:19197::"significant improvement in FEV1 per ATS criteria","significant improvement in FVC per ATS criteria","significant improvement in FEV1 and FVC per ATS criteria","improvement in FEV1, but not significant per ATS criteria","improvement in FVC, but not significant per ATS criteria","improvement in FEV1 and FVC, but not significant per ATS criteria","no improvement"}.  Allergy Studies: {Blank single:19197::"none","deferred due to recent antihistamine use","deferred due to insurance stipulations that require a separate visit for testing","labs sent instead"," "}    {Blank single:19197::"Allergy testing results were read and interpreted by myself, documented by clinical staff."," "}      Malachi Bonds, MD  [facility]

## 2023-06-21 NOTE — Patient Instructions (Addendum)
1. Moderate persistent asthma, uncomplicated - Lung testing looks consistent with some air trapping, but since you are feeling fine, I think we are in a good spot.   - Symptoms are definitely better than it was in the past.  - We will send in the prescription for the Trelegy and do this one puff once daily EVERY DAY!  - Daily controller medication(s): Singulair 10mg  daily + Trelegy one puff once daily - Rescue medications: albuterol 4 puffs every 4-6 hours as needed  - Asthma control goals:  * Full participation in all desired activities (may need albuterol before activity) * Albuterol use two time or less a week on average (not counting use with activity) * Cough interfering with sleep two time or less a month * Oral steroids no more than once a year * No hospitalizations  2. Chronic perennial rhinitis (cockroach, dust mite, molds) - completed 5 years maintenance  - Continue with Astelin 1-2 sprays up to twice daily.  3. GERD - Continue with Dexilant 60 mg daily.  - Continue with Pepcid 40mg  twice daily as well.  4. Return in about 3 months (around 09/19/2023). You can have the follow up appointment with Dr. Dellis Anes or a Nurse Practicioner (our Nurse Practitioners are excellent and always have Physician oversight!).    Please inform us of any Emergency Department visits, hospitalizations, or changes in symptoms. Call us before going to the ED for breathing or allergy symptoms since we might be able to fit you in for a sick visit. Feel free to contact us anytime with any questions, problems, or concerns.  It was a pleasure to see you again today!  Websites that have reliable patient information: 1. American Academy of Asthma, Allergy, and Immunology: www.aaaai.org 2. Food Allergy Research and Education (FARE): foodallergy.org 3. Mothers of Asthmatics: http://www.asthmacommunitynetwork.org 4. American College of Allergy, Asthma, and Immunology: www.acaai.org      "Like" [name]  on Facebook and Instagram for our latest updates!      A healthy democracy works best when [employer] participate! Make sure you are registered to vote! If you have moved or changed any of your contact information, you will need to get this updated before voting! Scan the QR codes below to learn more!        Medication Samples have been provided to the patient.  Drug name: Trellegy       Strength: 248mcg/62.4mcg/25mcg        Qty: 28 each  LOT: [id number]  Exp.Date: 12/2023  Dosing instructions: One Puff Once Daily.  The patient has been instructed regarding the correct time, dose, and frequency of taking this medication, including desired effects and most common side effects.   Brianna Santana 4:10 PM 06/21/2023

## 2023-06-22 ENCOUNTER — Encounter: Payer: Self-pay | Admitting: Allergy & Immunology

## 2023-07-06 ENCOUNTER — Other Ambulatory Visit

## 2023-07-07 ENCOUNTER — Telehealth: Payer: Self-pay | Admitting: Gastroenterology

## 2023-07-07 ENCOUNTER — Telehealth: Payer: Self-pay | Admitting: Allergy & Immunology

## 2023-07-07 DIAGNOSIS — K5010 Crohn's disease of large intestine without complications: Secondary | ICD-10-CM

## 2023-07-07 MED ORDER — DEXLANSOPRAZOLE 60 MG PO CPDR: 60.0000 mg | DELAYED_RELEASE_CAPSULE | Freq: Every day | ORAL | 3 refills | Status: DC

## 2023-07-07 MED ORDER — HUMIRA (2 PEN) 40 MG/0.4ML ~~LOC~~ AJKT: 40.0000 mg | AUTO-INJECTOR | SUBCUTANEOUS | 0 refills | Status: DC

## 2023-07-07 NOTE — Telephone Encounter (Signed)
 Brianna Santana called in and states she tried to refill her Dexilant  and was told the prescription had expired.  Lalisa would like a new prescription sent to [facility]

## 2023-07-07 NOTE — Telephone Encounter (Signed)
 Inbound call from patient stating she is in need for a refill for Humira  Pen and Dexilant . Patient has been scheduled for an appointment with Reginal Capra on 33/14 at 11:00. Please advise.

## 2023-07-07 NOTE — Telephone Encounter (Signed)
 Dr Rosaline Coma ok'd refills, I sent Dexilant  and Humira  to [facility] pharmacy

## 2023-07-07 NOTE — Telephone Encounter (Signed)
 I called Laxmi back and informed her that we were unable to give her samples of Dexilant .  I asked all of our offices and no one has any samples.  Dr. Lorin suggested sending the prescription to [facility] so she can get her medication sooner since she only had a couple left.  Tniyah states she would like the Dexilant  called in to the [facility]

## 2023-07-07 NOTE — Telephone Encounter (Signed)
 Dr Rosaline Coma, This was a Brianna Santana patient, needs med refills and you are Doc of the Day. Can she have refills?

## 2023-07-07 NOTE — Telephone Encounter (Signed)
 Forwarding message to provider.

## 2023-07-13 ENCOUNTER — Other Ambulatory Visit: Payer: Self-pay | Admitting: *Deleted

## 2023-07-13 MED ORDER — DEXLANSOPRAZOLE 60 MG PO CPDR: 60.0000 mg | DELAYED_RELEASE_CAPSULE | Freq: Every day | ORAL | 5 refills | Status: DC

## 2023-07-13 MED ORDER — FAMOTIDINE 40 MG PO TABS: 40.0000 mg | ORAL_TABLET | Freq: Two times a day (BID) | ORAL | 1 refills | Status: DC

## 2023-07-13 MED ORDER — DEXLANSOPRAZOLE 60 MG PO CPDR: 60.0000 mg | DELAYED_RELEASE_CAPSULE | Freq: Every day | ORAL | 1 refills | Status: DC

## 2023-07-13 NOTE — Telephone Encounter (Signed)
Called and spoke with the patient and she needed the Dexilant and Famotidine sent to the [facility] in [place]. Prescriptions have been sent in to the requested pharmacy. Patient verbalized understanding.

## 2023-07-13 NOTE — Addendum Note (Signed)
Addended by: Alfonse Spruce on: 07/13/2023 01:29 PM   Modules accepted: Orders

## 2023-07-13 NOTE — Telephone Encounter (Signed)
Sent in prescription

## 2023-08-02 ENCOUNTER — Encounter: Payer: Self-pay | Admitting: Internal Medicine

## 2023-08-02 ENCOUNTER — Ambulatory Visit (INDEPENDENT_AMBULATORY_CARE_PROVIDER_SITE_OTHER): Admitting: Internal Medicine

## 2023-08-02 VITALS — BP 124/74 | HR 81 | Ht 64.0 in | Wt 182.6 lb

## 2023-08-02 DIAGNOSIS — J329 Chronic sinusitis, unspecified: Secondary | ICD-10-CM | POA: Diagnosis not present

## 2023-08-02 DIAGNOSIS — R053 Chronic cough: Secondary | ICD-10-CM | POA: Diagnosis not present

## 2023-08-02 DIAGNOSIS — Z8719 Personal history of other diseases of the digestive system: Secondary | ICD-10-CM | POA: Diagnosis not present

## 2023-08-02 DIAGNOSIS — Z8616 Personal history of COVID-19: Secondary | ICD-10-CM

## 2023-08-02 DIAGNOSIS — U099 Post covid-19 condition, unspecified: Secondary | ICD-10-CM | POA: Diagnosis not present

## 2023-08-02 LAB — CBC WITH DIFFERENTIAL/PLATELET
Basophils Absolute: 0.1 10*3/uL (ref 0.0–0.1)
Basophils Relative: 1.6 % (ref 0.0–3.0)
Eosinophils Absolute: 0.4 10*3/uL (ref 0.0–0.7)
Eosinophils Relative: 12.4 % — ABNORMAL HIGH (ref 0.0–5.0)
HCT: 39.8 % (ref 36.0–46.0)
Hemoglobin: 13.3 g/dL (ref 12.0–15.0)
Lymphocytes Relative: 37.7 % (ref 12.0–46.0)
Lymphs Abs: 1.3 10*3/uL (ref 0.7–4.0)
MCHC: 33.4 g/dL (ref 30.0–36.0)
MCV: 91.9 fl (ref 78.0–100.0)
Monocytes Absolute: 0.4 10*3/uL (ref 0.1–1.0)
Monocytes Relative: 12.7 % — ABNORMAL HIGH (ref 3.0–12.0)
Neutro Abs: 1.2 10*3/uL — ABNORMAL LOW (ref 1.4–7.7)
Neutrophils Relative %: 35.6 % — ABNORMAL LOW (ref 43.0–77.0)
Platelets: 303 10*3/uL (ref 150.0–400.0)
RBC: 4.33 Mil/uL (ref 3.87–5.11)
RDW: 13.7 % (ref 11.5–15.5)
WBC: 3.4 10*3/uL — ABNORMAL LOW (ref 4.0–10.5)

## 2023-08-02 LAB — NITRIC OXIDE: Nitric Oxide: 9

## 2023-08-02 NOTE — Progress Notes (Signed)
 OV 08/02/2023  Subjective:  Patient ID: Brianna Santana, female , DOB: September 21, 1955 , age 68 y.o. , MRN: [id number] , ADDRESS: [address] [place] [address] PCP Gweneth Dimitri, MD Patient Care Team: Gweneth Dimitri, MD as PCP - General (Family Medicine)  This Provider for this visit: Treatment Team:  Attending Provider: Kalman Shan, MD    08/02/2023 -   Chief Complaint  Patient presents with   Pulmonary Consult    Self referral. Pt c/o cough since Covid 19 dx Sept 2023. Cough is prod with clear to light yellow sputum. She states she was dxed with Asthma "years ago". She has DOE if she moves too quickly.      HPI Brianna Santana 68 y.o. -new consult for chronic cough.  She tells me that at baseline she is has a history of seasonal allergies that "gets me all the time" and this has been on since her child.  But most pronounced is that in 08/28/2021 she suffered from COVID-19 for which she took Paxlovid treated as an outpatient.  Since then she has got severe chronic cough.  RSI cough score is below.  It is rated as moderate right now.  Since January she has been on Trelegy which has helped somewhat but she says she wakes up in the middle of the night cough is made worse by drinking milk or eating pork.  There is no clear-cut relieving factors.  There is chronic sinus drainage associated with it there is some wheezing associated with it there is also sputum of clear color and also occasionally yellow.  There are some mild associated shortness of breath.  Her exam nitric oxide test today is 9 ppb..  This is normal.  She is on Trelegy.  There is no fever.  She says the cough wakes her up in the night.  Is also present in the daytime.  She does have acid reflux and is on Pepcid and PPI She does not have hypertension - She does have chronic sinus drainage. She is not on ACE inhibitor's.   Dr Gretta Cool Reflux Symptom Index (> 13-15 suggestive of LPR cough) 0 -> 5  =   none ->severe problem 08/02/2023 Fino 9 ppb.  Hoarseness of problem with voice 4  Clearing  Of Throat 4  Excess throat mucus or feeling of post nasal drip 3  Difficulty swallowing food, liquid or tablets 3  Cough after eating or lying down 3  Breathing difficulties or choking episodes 3  Troublesome or annoying cough 4  Sensation of something sticking in throat or lump in throat 3  Heartburn, chest pain, indigestion, or stomach acid coming up 3  TOTAL 30          has a past medical history of Arthritis, Asthma, C. difficile colitis Aug 28, 2009 and 07/2010), Cervical dysplasia, Crohn disease (HCC), Crohn's disease (HCC), Hyperplastic polyp of intestine (09/2007), Osteopenia (11/2018), Thyroid disease, and Urinary incontinence.   reports that she quit smoking about 34 years ago. Her smoking use included cigarettes. She started smoking about 44 years ago. She has never used smokeless tobacco.  Past Surgical History:  Procedure Laterality Date   BREAST CYST ASPIRATION Right    08-29-98   BREAST EXCISIONAL BIOPSY Right    1980's   COLONOSCOPY  09/18/2013   COLPOSCOPY     FLEXIBLE SIGMOIDOSCOPY  03/29/2012   Procedure: FLEXIBLE SIGMOIDOSCOPY;  Surgeon: Meryl Dare, MD,FACG;  Location: [facility];  Service: Endoscopy;  Laterality:  N/A;   GANGLION CYST EXCISION Left    POLYPECTOMY     TONSILLECTOMY     TOTAL ABDOMINAL HYSTERECTOMY  2000   TRIGGER FINGER RELEASE     WRIST SURGERY Left 2021    Allergies  Allergen Reactions   Seasonal Ic [Cholestatin]    Tramadol     Major Headaches   Amoxicillin Itching and Rash   Levofloxacin Rash   Tramadol Hcl Rash    Immunization History  Administered Date(s) Administered   Hepatitis B, PED/ADOLESCENT 04/11/2013, 05/10/2013, 11/12/2013   Influenza Split 04/12/2012   Influenza,inj,Quad PF,6+ Mos 05/02/2015, 04/05/2016, 02/18/2017, 02/26/2019   Influenza,inj,Quad PF,6-35 Mos 03/05/2020   PFIZER(Purple Top)SARS-COV-2 Vaccination 07/24/2019,  08/14/2019, 02/19/2020   PPD Test 03/30/2012, 11/16/2013, 12/03/2014   Pneumococcal Polysaccharide-23 03/05/2019   Pneumococcal-Unspecified 02/29/2012   Tdap 09/23/2020   Zoster, Live 09/25/2019, 03/05/2020    Family History  Problem Relation Age of Onset   Diabetes Father    Diabetes Sister        x 2   Hypertension Sister        x 2   Diabetes Brother    Hypertension Brother    Colon cancer Neg Hx    Esophageal cancer Neg Hx    Pancreatic cancer Neg Hx    Rectal cancer Neg Hx    Stomach cancer Neg Hx    Colon polyps Neg Hx    Breast cancer Neg Hx      Current Outpatient Medications:    adalimumab (HUMIRA, 2 PEN,) 40 MG/0.4ML pen, Inject 0.4 mLs (40 mg total) into the skin every 14 (fourteen) days., Disp: 6 each, Rfl: 0   albuterol (VENTOLIN HFA) 108 (90 Base) MCG/ACT inhaler, INHALE 2 PUFFS INTO THE LUNGS EVERY 4 HOURS AS NEEDED FOR SHORTNESS OF BREATH OR WHEEZING (MAXIMUM 12 PUFFS TOTAL PER 24 HOURS), Disp: 54 g, Rfl: 1   azelastine (ASTELIN) 0.1 % nasal spray, SPRAY TWO SPRAYS INTO EACH NOSTRIL TWICE A DAY AS NEEDED FOR RHINITIS, Disp: 90 mL, Rfl: 1   CALCIUM-MAGNESIUM-VITAMIN D PO, Take 2 tablets by mouth daily. With Zinc, Disp: , Rfl:    cetirizine (ZYRTEC) 10 MG tablet, Take 1 tablet (10 mg total) by mouth 2 (two) times daily as needed for allergies (Can take an extra dose if needed during flare ups.)., Disp: 180 tablet, Rfl: 1   dexlansoprazole (DEXILANT) 60 MG capsule, Take 1 capsule (60 mg total) by mouth daily., Disp: 90 capsule, Rfl: 1   diphenhydrAMINE (BENADRYL) 25 MG tablet, Take 1 tablet (25 mg total) by mouth every 6 (six) hours as needed., Disp: 20 tablet, Rfl: 0   EPINEPHrine (EPIPEN 2-PAK) 0.3 mg/0.3 mL IJ SOAJ injection, Inject 0.3 mg into the muscle as needed for anaphylaxis., Disp: 1 each, Rfl: 1   famotidine (PEPCID) 40 MG tablet, Take 1 tablet (40 mg total) by mouth 2 (two) times daily., Disp: 180 tablet, Rfl: 1   Fluticasone-Umeclidin-Vilant (TRELEGY  ELLIPTA) 200-62.5-25 MCG/ACT AEPB, Inhale 1 puff into the lungs daily., Disp: 28 each, Rfl: 5   montelukast (SINGULAIR) 10 MG tablet, Take 1 tablet (10 mg total) by mouth at bedtime., Disp: 90 tablet, Rfl: 1   ondansetron (ZOFRAN-ODT) 4 MG disintegrating tablet, Take 1 tablet (4 mg total) by mouth every 8 (eight) hours as needed for nausea or vomiting., Disp: 10 tablet, Rfl: 0   promethazine-dextromethorphan (PROMETHAZINE-DM) 6.25-15 MG/5ML syrup, Take 5 mLs by mouth 4 (four) times daily as needed for cough., Disp: 240 mL, Rfl: 1  potassium chloride (K-DUR) 10 MEQ tablet, Take 1 tablet (10 mEq total) by mouth daily. (Patient not taking: Reported on 08/02/2023), Disp: 90 tablet, Rfl: 1      Objective:   Vitals:   08/02/23 1402  BP: 124/74  Pulse: 81  SpO2: 96%  Weight: 182 lb 9.6 oz (82.8 kg)  Santana: 5\' 4"  (1.626 m)    Estimated body mass index is 31.34 kg/m as calculated from the following:   Santana as of this encounter: 5\' 4"  (1.626 m).   Weight as of this encounter: 182 lb 9.6 oz (82.8 kg).  @WEIGHTCHANGE @  [employer]   08/02/23 1402  Weight: 182 lb 9.6 oz (82.8 kg)     Physical Exam   General: No distress. Looks well O2 at rest: no Cane present: no Sitting in wheel chair: no Frail: no Obese: yes Neuro: Alert and Oriented x 3. GCS 15. Speech normal Psych: Pleasant Resp:  Barrel Chest - no.  Wheeze - yes scattered mild, Crackles - no, No overt respiratory distress CVS: Normal heart sounds. Murmurs - no Ext: Stigmata of Connective Tissue Disease - no HEENT: Normal upper airway. PEERL +. No post nasal drip       Assessment:       ICD-10-CM   1. COVID-19 long hauler manifesting chronic cough  R05.3 CBC w/Diff   U09.9 Perennial allergen profile IgE    Pulmonary function test    CT Chest High Resolution    CT Maxillofacial LTD WO CM    2. Chronic cough  R05.3 CBC w/Diff    Perennial allergen profile IgE    Pulmonary function test    CT Chest High  Resolution    CT Maxillofacial LTD WO CM    Nitric oxide    3. History of 2019 novel coronavirus disease (COVID-19)  Z86.16 CBC w/Diff    Perennial allergen profile IgE    Pulmonary function test    CT Chest High Resolution    CT Maxillofacial LTD WO CM    4. Chronic sinusitis, unspecified location  J32.9 CBC w/Diff    Perennial allergen profile IgE    Pulmonary function test    CT Chest High Resolution    CT Maxillofacial LTD WO CM    5. History of gastroesophageal reflux (GERD)  Z87.19          Plan:     Patient Instructions     ICD-10-CM   1. COVID-19 long hauler manifesting chronic cough  R05.3 CBC w/Diff   U09.9 Perennial allergen profile IgE    Pulmonary function test    CT Chest High Resolution    CT Maxillofacial LTD WO CM    2. Chronic cough  R05.3 CBC w/Diff    Perennial allergen profile IgE    Pulmonary function test    CT Chest High Resolution    CT Maxillofacial LTD WO CM    Nitric oxide    3. History of 2019 novel coronavirus disease (COVID-19)  Z86.16 CBC w/Diff    Perennial allergen profile IgE    Pulmonary function test    CT Chest High Resolution    CT Maxillofacial LTD WO CM    4. Chronic sinusitis, unspecified location  J32.9 CBC w/Diff    Perennial allergen profile IgE    Pulmonary function test    CT Chest High Resolution    CT Maxillofacial LTD WO CM    5. History of gastroesophageal reflux (GERD)  Z87.19  You could have postviral reactive airway disease in the form of asthma and what is called hit and run phenomena with viruses causing asthma.  Separately could also have chronic inflammation of fibrosis in the lung after COVID.  You could also have cough neuropathy independent of these problems.  In addition his sinus complaints will be making her cough worse  Plan - Do exam nitric oxide test today in the office - Do CBC with differential and RAST allergy profile -Do full pulmonary function test - Do high-resolution CT  chest -Do CT scan of the sinuses without contrast -Meanwhile continue Trelegy, singulair, scheduled albuterol as needed - continue GERD control with dexilant and pepcid scheduled  Follow-up - 2-6 weeks with nurse practitioner Dr. Marchelle Gearing; video visit is fine to discuss results  -Consider ENT consult if everything is noncontributory.      FOLLOWUP Return in about 3 weeks (around 08/23/2023) for VIDEO VISIT, with Dr Marchelle Gearing, with any of the APPS,  2-6 weeks.    SIGNATURE    Dr. Kalman Shan, M.D., F.C.C.P,  Pulmonary and Critical Care Medicine Staff Physician, [facility] Director - Interstitial Lung Disease  Program  [facility] [place], [place], [address]  Pager: [phone], If no answer or between  15:00h - 7:00h: call [phone] Telephone: [phone]  2:29 PM 08/02/2023

## 2023-08-02 NOTE — Patient Instructions (Addendum)
 ICD-10-CM   1. COVID-19 long hauler manifesting chronic cough  R05.3 CBC w/Diff   U09.9 Perennial allergen profile IgE    Pulmonary function test    CT Chest High Resolution    CT Maxillofacial LTD WO CM    2. Chronic cough  R05.3 CBC w/Diff    Perennial allergen profile IgE    Pulmonary function test    CT Chest High Resolution    CT Maxillofacial LTD WO CM    Nitric oxide    3. History of 2019 novel coronavirus disease (COVID-19)  Z86.16 CBC w/Diff    Perennial allergen profile IgE    Pulmonary function test    CT Chest High Resolution    CT Maxillofacial LTD WO CM    4. Chronic sinusitis, unspecified location  J32.9 CBC w/Diff    Perennial allergen profile IgE    Pulmonary function test    CT Chest High Resolution    CT Maxillofacial LTD WO CM    5. History of gastroesophageal reflux (GERD)  Z87.19       You could have postviral reactive airway disease in the form of asthma and what is called hit and run phenomena with viruses causing asthma.  Separately could also have chronic inflammation of fibrosis in the lung after COVID.  You could also have cough neuropathy independent of these problems.  In addition his sinus complaints will be making her cough worse  Plan - Do exam nitric oxide test today in the office - Do CBC with differential and RAST allergy profile -Do full pulmonary function test - Do high-resolution CT chest -Do CT scan of the sinuses without contrast -Meanwhile continue Trelegy, singulair, scheduled albuterol as needed - continue GERD control with dexilant and pepcid scheduled  Follow-up - 2-6 weeks with nurse practitioner Dr. Marchelle Gearing; video visit is fine to discuss results  -Consider ENT consult if everything is noncontributory.

## 2023-08-03 ENCOUNTER — Telehealth: Payer: Self-pay | Admitting: Allergy & Immunology

## 2023-08-03 NOTE — Telephone Encounter (Signed)
 Patient called stating she is needing a refill on Montelukast sent to [facility]  pharmacy on [address] in [place].

## 2023-08-04 MED ORDER — MONTELUKAST SODIUM 10 MG PO TABS: 10.0000 mg | ORAL_TABLET | Freq: Every day | ORAL | 1 refills | Status: DC

## 2023-08-04 NOTE — Telephone Encounter (Signed)
 Sent in montelukast to [facility] as pt was seen 2 months ago by Dr Dellis Anes

## 2023-08-06 LAB — ALLERGEN PROFILE, PERENNIAL ALLERGEN IGE

## 2023-08-12 ENCOUNTER — Ambulatory Visit (INDEPENDENT_AMBULATORY_CARE_PROVIDER_SITE_OTHER): Admitting: Physician Assistant

## 2023-08-12 ENCOUNTER — Encounter: Payer: Self-pay | Admitting: Physician Assistant

## 2023-08-12 ENCOUNTER — Other Ambulatory Visit (INDEPENDENT_AMBULATORY_CARE_PROVIDER_SITE_OTHER)

## 2023-08-12 VITALS — BP 128/76 | HR 73 | Ht 64.0 in | Wt 182.8 lb

## 2023-08-12 DIAGNOSIS — K5010 Crohn's disease of large intestine without complications: Secondary | ICD-10-CM

## 2023-08-12 DIAGNOSIS — K219 Gastro-esophageal reflux disease without esophagitis: Secondary | ICD-10-CM

## 2023-08-12 LAB — COMPREHENSIVE METABOLIC PANEL
ALT: 25 U/L (ref 0–35)
AST: 18 U/L (ref 0–37)
Albumin: 4.6 g/dL (ref 3.5–5.2)
Alkaline Phosphatase: 117 U/L (ref 39–117)
BUN: 11 mg/dL (ref 6–23)
CO2: 27 meq/L (ref 19–32)
Calcium: 9.4 mg/dL (ref 8.4–10.5)
Chloride: 104 meq/L (ref 96–112)
Creatinine, Ser: 0.67 mg/dL (ref 0.40–1.20)
GFR: 90.13 mL/min (ref >60.00)
Glucose, Bld: 93 mg/dL (ref 70–99)
Potassium: 4.1 meq/L (ref 3.5–5.1)
Sodium: 139 meq/L (ref 135–145)
Total Bilirubin: 0.4 mg/dL (ref 0.2–1.2)
Total Protein: 7.5 g/dL (ref 6.0–8.3)

## 2023-08-12 MED ORDER — DEXLANSOPRAZOLE 60 MG PO CPDR: 60.0000 mg | DELAYED_RELEASE_CAPSULE | Freq: Every day | ORAL | 3 refills | Status: DC

## 2023-08-12 MED ORDER — FAMOTIDINE 40 MG PO TABS: 40.0000 mg | ORAL_TABLET | Freq: Two times a day (BID) | ORAL | 3 refills | Status: DC

## 2023-08-12 MED ORDER — HUMIRA (2 PEN) 40 MG/0.4ML ~~LOC~~ AJKT: 40.0000 mg | AUTO-INJECTOR | SUBCUTANEOUS | 3 refills | Status: AC

## 2023-08-12 NOTE — Progress Notes (Signed)
 Chief Complaint: Follow-up Crohn's disease  HPI:    Brianna Santana is a 68 year old female with a past medical history as listed below including Crohn's disease, previously known to Dr. Russella Dar, who presents to clinic today for follow-up of her Crohn's disease.    11/07/2018 colonoscopy with localized mild inflammation in the cecum secondary to colitis, quiescent Crohn's disease, inflammation was found from the sigmoid colon to the descending colon, mild diverticulosis in the left colon, internal hemorrhoids.  Repeat recommended in 5 years.    06/07/2022 patient seen in clinic by Dr. Russella Dar.  At that time had Crohn's colitis doing well on Humira 40 mg sq. 14 days, also discussed history of GERD controlled on Dexilant 60 mg daily.  She had internal hemorrhoids with occasional bleeding and he had recommended a colonoscopy in June 2025.    07/07/2023 patient called in for refill of her Humira pen and Dexilant.  This was refilled by Dr. Leonides Schanz.    Today, the patient presents to clinic and tells me that she ran out of her Humira about a month ago.  She has had 2 diarrheal stools since then.  She has been on this for years.  Apparently talked with one of her other physicians who noted she had been on Humira for years and said she should ask [name] about Entyvio.  Patient really does not know anything about this medicine, it was just under possible suggestion from her other doctor.  She tells me she seems to do fine with the Humira though their needle size has changed lately and is a lot bigger than it used to be.    GERD still controlled on Dexilant 60 mg daily and Famotidine 40 mg twice daily.  She needs refills of these medications too.    Denies fever, chills or weight loss.   Past Medical History:  Diagnosis Date   Arthritis    Asthma    C. difficile colitis 2011 and 07/2010   Cervical dysplasia    Crohn disease (HCC)    Crohn's disease (HCC)    Hyperplastic polyp of intestine 09/2007   Osteopenia 11/2018   T  score -2.0 FRAX 3.7% / 0.3%   Thyroid disease    Urinary incontinence     Past Surgical History:  Procedure Laterality Date   BREAST CYST ASPIRATION Right    2000   BREAST EXCISIONAL BIOPSY Right    1980's   COLONOSCOPY  09/18/2013   COLPOSCOPY     FLEXIBLE SIGMOIDOSCOPY  03/29/2012   Procedure: FLEXIBLE SIGMOIDOSCOPY;  Surgeon: Meryl Dare, MD,FACG;  Location: [facility];  Service: Endoscopy;  Laterality: N/A;   GANGLION CYST EXCISION Left    POLYPECTOMY     TONSILLECTOMY     TOTAL ABDOMINAL HYSTERECTOMY  2000   TRIGGER FINGER RELEASE     WRIST SURGERY Left 2021    Current Outpatient Medications  Medication Sig Dispense Refill   adalimumab (HUMIRA, 2 PEN,) 40 MG/0.4ML pen Inject 0.4 mLs (40 mg total) into the skin every 14 (fourteen) days. 6 each 0   albuterol (VENTOLIN HFA) 108 (90 Base) MCG/ACT inhaler INHALE 2 PUFFS INTO THE LUNGS EVERY 4 HOURS AS NEEDED FOR SHORTNESS OF BREATH OR WHEEZING (MAXIMUM 12 PUFFS TOTAL PER 24 HOURS) 54 g 1   azelastine (ASTELIN) 0.1 % nasal spray SPRAY TWO SPRAYS INTO EACH NOSTRIL TWICE A DAY AS NEEDED FOR RHINITIS 90 mL 1   CALCIUM-MAGNESIUM-VITAMIN D PO Take 2 tablets by mouth daily. With Zinc  cetirizine (ZYRTEC) 10 MG tablet Take 1 tablet (10 mg total) by mouth 2 (two) times daily as needed for allergies (Can take an extra dose if needed during flare ups.). 180 tablet 1   dexlansoprazole (DEXILANT) 60 MG capsule Take 1 capsule (60 mg total) by mouth daily. 90 capsule 1   diphenhydrAMINE (BENADRYL) 25 MG tablet Take 1 tablet (25 mg total) by mouth every 6 (six) hours as needed. 20 tablet 0   EPINEPHrine (EPIPEN 2-PAK) 0.3 mg/0.3 mL IJ SOAJ injection Inject 0.3 mg into the muscle as needed for anaphylaxis. 1 each 1   famotidine (PEPCID) 40 MG tablet Take 1 tablet (40 mg total) by mouth 2 (two) times daily. 180 tablet 1   Fluticasone-Umeclidin-Vilant (TRELEGY ELLIPTA) 200-62.5-25 MCG/ACT AEPB Inhale 1 puff into the lungs daily. 28 each 5    montelukast (SINGULAIR) 10 MG tablet Take 1 tablet (10 mg total) by mouth at bedtime. 90 tablet 1   ondansetron (ZOFRAN-ODT) 4 MG disintegrating tablet Take 1 tablet (4 mg total) by mouth every 8 (eight) hours as needed for nausea or vomiting. 10 tablet 0   potassium chloride (K-DUR) 10 MEQ tablet Take 1 tablet (10 mEq total) by mouth daily. (Patient not taking: Reported on 08/02/2023) 90 tablet 1   promethazine-dextromethorphan (PROMETHAZINE-DM) 6.25-15 MG/5ML syrup Take 5 mLs by mouth 4 (four) times daily as needed for cough. 240 mL 1   No current facility-administered medications for this visit.    Allergies as of 08/12/2023 - Review Complete 08/02/2023  Allergen Reaction Noted   Seasonal ic [cholestatin]  08/13/2015   Tramadol  11/21/2014   Amoxicillin Itching and Rash 07/30/2014   Levofloxacin Rash 10/16/2019   Tramadol hcl Rash 03/12/2021    Family History  Problem Relation Age of Onset   Diabetes Father    Diabetes Sister        x 2   Hypertension Sister        x 2   Diabetes Brother    Hypertension Brother    Colon cancer Neg Hx    Esophageal cancer Neg Hx    Pancreatic cancer Neg Hx    Rectal cancer Neg Hx    Stomach cancer Neg Hx    Colon polyps Neg Hx    Breast cancer Neg Hx     Social History   Socioeconomic History   Marital status: Married    Spouse name: Not on file   Number of children: 3   Years of education: Not on file   Highest education level: Not on file  Occupational History   Occupation: Retired    Associate Professor: UNEMPLOYED  Tobacco Use   Smoking status: Former    Current packs/day: 0.00    Types: Cigarettes    Start date: 09/08/1978    Quit date: 09/07/1988    Years since quitting: 34.9   Smokeless tobacco: Never  Vaping Use   Vaping status: Never Used  Substance and Sexual Activity   Alcohol use: No    Alcohol/week: 0.0 standard drinks of alcohol   Drug use: No   Sexual activity: Yes    Birth control/protection: Surgical    Comment:  hysterectomy,DECLINED NSURANCE QUESTIONS  Other Topics Concern   Not on file  Social History Narrative   Not on file   Social Drivers of Health   Financial Resource Strain: Not on file  Food Insecurity: Not on file  Transportation Needs: Not on file  Physical Activity: Not on file  Stress: Not on  file  Social Connections: Unknown (12/01/2022)   [facility], [facility]   Social Network    Social Network: Not on file  Intimate Partner Violence: Unknown (12/01/2022)   Received from [facility], [facility]   HITS    Physically Hurt: Not on file    Insult or Talk Down To: Not on file    Threaten Physical Harm: Not on file    Scream or Curse: Not on file    Review of Systems:    Constitutional: No weight loss, fever or chills Cardiovascular: No chest pain  Respiratory: No SOB  Gastrointestinal: See HPI and otherwise negative   Physical Exam:  Vital signs: BP 128/76   Pulse 73   Ht 5\' 4"  (1.626 m)   Wt 182 lb 12.8 oz (82.9 kg)   BMI 31.38 kg/m   Constitutional:   Pleasant AA female appears to be in NAD, Well developed, Well nourished, alert and cooperative Respiratory: Respirations even and unlabored. Lungs clear to auscultation bilaterally.   No wheezes, crackles, or rhonchi.  Cardiovascular: Normal S1, S2. No MRG. Regular rate and rhythm. No peripheral edema, cyanosis or pallor.  Gastrointestinal:  Soft, nondistended, nontender. No rebound or guarding. Normal bowel sounds. No appreciable masses or hepatomegaly. Rectal:  Not performed.  Psychiatric: Oriented to person, place and time. Demonstrates good judgement and reason without abnormal affect or behaviors.  RELEVANT LABS AND IMAGING: CBC    Component Value Date/Time   WBC 3.4 (L) 08/02/2023 1515   RBC 4.33 08/02/2023 1515   HGB 13.3 08/02/2023 1515   HGB 12.5 12/16/2022 1708   HCT 39.8 08/02/2023 1515   HCT 37.0 12/16/2022 1708   PLT 303.0 08/02/2023 1515   PLT 294 12/16/2022 1708   MCV  91.9 08/02/2023 1515   MCV 90 12/16/2022 1708   MCH 30.4 12/16/2022 1708   MCH 30.5 09/19/2022 1534   MCHC 33.4 08/02/2023 1515   RDW 13.7 08/02/2023 1515   RDW 12.8 12/16/2022 1708   LYMPHSABS 1.3 08/02/2023 1515   LYMPHSABS 2.3 12/16/2022 1708   MONOABS 0.4 08/02/2023 1515   EOSABS 0.4 08/02/2023 1515   EOSABS 0.3 12/16/2022 1708   BASOSABS 0.1 08/02/2023 1515   BASOSABS 0.1 12/16/2022 1708    CMP     Component Value Date/Time   NA 134 (L) 09/19/2022 1534   NA 139 02/20/2022 1154   K 3.7 09/19/2022 1534   CL 99 09/19/2022 1534   CO2 22 09/19/2022 1534   GLUCOSE 120 (H) 09/19/2022 1534   BUN 12 09/19/2022 1534   BUN 12 02/20/2022 1154   CREATININE 0.77 09/19/2022 1534   CALCIUM 9.3 09/19/2022 1534   PROT 7.0 09/19/2022 1534   PROT 7.7 02/20/2022 1154   ALBUMIN 4.0 09/19/2022 1534   ALBUMIN 4.9 02/20/2022 1154   AST 26 09/19/2022 1534   ALT 22 09/19/2022 1534   ALKPHOS 93 09/19/2022 1534   BILITOT 1.3 (H) 09/19/2022 1534   BILITOT 0.7 02/20/2022 1154   GFRNONAA >60 09/19/2022 1534   GFRAA >90 04/04/2012 0540    Assessment: 1.  Crohn's: Currently out of Humira over the past month, has had 2 diarrheal stools, otherwise her symptoms have been controlled on Humira for years and she has done well, she is due for a colonoscopy coming up in July, asked about possible switch to [facility] 2.  GERD: Controlled on Dexilant 60 mg daily and Famotidine 40 mg twice daily  Plan: 1.  We discussed that as long  as she still doing well on Humira there is no real reason to change this.  I will find out if she could possibly get a smaller needle size for this given that the larger needle seems to bother her. 2.  For now refill Humira 40 mg subcu every 2 weeks, 90-day refill with 3 refills. 3.  Refill Dexilant 60 mg #90 with 3 refills 4.  Refill Famotidine 40 mg twice daily #180 with 3 refills 5.  Patient was placed in recall for colonoscopy in July with Dr. Lavon Paganini.  Discussed with  patient that at time of colonoscopy Dr. Lavon Paganini can assess her disease state.  If it seems like she may benefit from a switch to another medication then can discuss Entyvio further.  For now she will stay on Humira. 6.  Updated CMP today.  Recent CBC as in HPI.  Brianna Meeker, PA-C [facility] 08/12/2023, 10:47 AM  Cc: Gweneth Dimitri, MD

## 2023-08-12 NOTE — Patient Instructions (Signed)
 Your provider has requested that you go to the basement level for lab work before leaving today. Press "B" on the elevator. The lab is located at the first door on the left as you exit the elevator.  We have sent the following medications to your pharmacy for you to pick up at your convenience: Dexilant, Humira and Famotidine  _______________________________________________________  If your blood pressure at your visit was 140/90 or greater, please contact your primary care physician to follow up on this.  _______________________________________________________  If you are age 68 or older, your body mass index should be between 23-30. Your Body mass index is 31.38 kg/m. If this is out of the aforementioned range listed, please consider follow up with your Primary Care Provider.  If you are age 63 or younger, your body mass index should be between 19-25. Your Body mass index is 31.38 kg/m. If this is out of the aformentioned range listed, please consider follow up with your Primary Care Provider.   ________________________________________________________  The [facility] providers would like to encourage you to use [facility] to communicate with providers for non-urgent requests or questions.  Due to long hold times on the telephone, sending your provider a message by [facility] may be a faster and more efficient way to get a response.  Please allow 48 business hours for a response.  Please remember that this is for non-urgent requests.  _______________________________________________________

## 2023-08-15 ENCOUNTER — Ambulatory Visit
Admission: RE | Admit: 2023-08-15 | Discharge: 2023-08-15 | Disposition: A | Discharge status: Home / Self Care | Source: Ambulatory Visit | Attending: Internal Medicine | Admitting: Internal Medicine

## 2023-08-15 DIAGNOSIS — J329 Chronic sinusitis, unspecified: Secondary | ICD-10-CM

## 2023-08-15 DIAGNOSIS — R053 Chronic cough: Secondary | ICD-10-CM

## 2023-08-15 DIAGNOSIS — Z8616 Personal history of COVID-19: Secondary | ICD-10-CM

## 2023-08-17 ENCOUNTER — Telehealth: Payer: Self-pay

## 2023-08-17 NOTE — Telephone Encounter (Signed)
Error--disregard message below.

## 2023-08-17 NOTE — Telephone Encounter (Deleted)
 Received a call from [facility] that the pt is complaining of nausea and abd pain after 3/18 paracentesis.  5 liters removed.  She was given zofran with no relief.  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  She is asking for further advice vs additional medication for nausea. Please advise

## 2023-09-20 ENCOUNTER — Other Ambulatory Visit: Payer: Self-pay

## 2023-09-20 ENCOUNTER — Ambulatory Visit (INDEPENDENT_AMBULATORY_CARE_PROVIDER_SITE_OTHER): Admitting: Allergy & Immunology

## 2023-09-20 ENCOUNTER — Encounter: Payer: Self-pay | Admitting: Allergy & Immunology

## 2023-09-20 VITALS — BP 108/62 | HR 90 | Temp 97.6°F | Resp 12

## 2023-09-20 DIAGNOSIS — J3089 Other allergic rhinitis: Secondary | ICD-10-CM | POA: Diagnosis not present

## 2023-09-20 DIAGNOSIS — K219 Gastro-esophageal reflux disease without esophagitis: Secondary | ICD-10-CM

## 2023-09-20 DIAGNOSIS — J4540 Moderate persistent asthma, uncomplicated: Secondary | ICD-10-CM

## 2023-09-20 MED ORDER — SPIRIVA RESPIMAT 2.5 MCG/ACT IN AERS: 2.0000 | INHALATION_SPRAY | RESPIRATORY_TRACT | 1 refills | Status: DC

## 2023-09-20 MED ORDER — PREDNISONE 10 MG PO TABS: ORAL_TABLET | ORAL | 0 refills | Status: DC

## 2023-09-20 MED ORDER — FAMOTIDINE 40 MG PO TABS: 40.0000 mg | ORAL_TABLET | Freq: Two times a day (BID) | ORAL | 3 refills | Status: DC

## 2023-09-20 MED ORDER — AZELASTINE HCL 0.1 % NA SOLN: NASAL | 1 refills | Status: AC

## 2023-09-20 MED ORDER — CETIRIZINE HCL 10 MG PO TABS: 10.0000 mg | ORAL_TABLET | Freq: Two times a day (BID) | ORAL | 1 refills | Status: AC | PRN

## 2023-09-20 MED ORDER — FLUTICASONE-SALMETEROL 250-50 MCG/ACT IN AEPB: 1.0000 | INHALATION_SPRAY | Freq: Two times a day (BID) | RESPIRATORY_TRACT | 1 refills | Status: AC

## 2023-09-20 MED ORDER — METHYLPREDNISOLONE ACETATE 80 MG/ML IJ SUSP
80.0000 mg | Freq: Once | INTRAMUSCULAR | Status: AC
  Administered 2023-09-20: 80 mg via INTRAMUSCULAR

## 2023-09-20 MED ORDER — MONTELUKAST SODIUM 10 MG PO TABS: 10.0000 mg | ORAL_TABLET | Freq: Every day | ORAL | 1 refills | Status: DC

## 2023-09-20 NOTE — Progress Notes (Signed)
 FOLLOW UP  Date of Service/Encounter:  09/20/23   Assessment:   Moderate persistent asthma without complication - with worsening symptoms with the onset of the allergy season   Prolonged coughing illness x 2 to 3 months with bilateral maxillary sinus tenderness    Perennial allergic rhinitis  (cockroach, molds, dust mites) - on allergen immunotherapy with maintenance reached in September 2019, stopped shots in 2024 and now interested in resuming them  Current allergic flare   GERD - doing better on PPI and H2 blocker    Vaccinated to [employer] a "Naturalist" Mariah Shines)   Plan/Recommendations:   1. Moderate persistent asthma, uncomplicated - Lung testing looks better today. - We are going to add on Spiriva  to see if we can get that on board to help with your Advair to work better. - We may consider the addition of injectable asthma medication.  - DepoMedrol injection given today (80mg ).  - Daily controller medication(s): Singulair  10mg  daily + Advair 250/50mcg + Spiriva  2.5mcg two puffs once daily - Rescue medications: albuterol  4 puffs every 4-6 hours as needed  - Asthma control goals:  * Full participation in all desired activities (may need albuterol  before activity) * Albuterol  use two time or less a week on average (not counting use with activity) * Cough interfering with sleep two time or less a month * Oral steroids no more than once a year * No hospitalizations  2. Chronic perennial rhinitis (cockroach, dust mite, molds) - completed 5 years maintenance  - Continue with Astelin  1-2 sprays up to twice daily. - Make an appointment for repeat skin testing in 4 weeks.  - We will plan to restart the allergy shots.    3. GERD - Continue with Dexilant  60 mg daily.  - Continue with Pepcid  40mg  twice daily as well.  4. Return in about 4 weeks (around 10/18/2023) for SKIN TESTING (1-55). You can have the follow up appointment with Dr. Idolina Maker or a Nurse Practicioner  (our Nurse Practitioners are excellent and always have Physician oversight!).    Subjective:   Brianna Santana is a 68 y.o. female presenting today for follow up of  Chief Complaint  Patient presents with   Asthma   Allergies   Gastroesophageal Reflux    Brianna Santana has a history of the following: Patient Active Problem List   Diagnosis Date Noted   COVID-19 virus infection 02/24/2022   Moderate persistent asthma with (acute) exacerbation 02/24/2022   Trigger thumb, left thumb 03/26/2020   Stenosing tenosynovitis of wrist 03/26/2020   Perennial allergic rhinitis 12/20/2016   Cough 05/10/2016   Acute left-sided thoracic back pain 04/05/2016   Routine general medical examination at a health care facility 05/02/2015   Thyroid  mass 01/16/2015   Drug allergy 07/30/2014   Crohn's disease (HCC) 03/18/2014   Osteopenia 09/11/2013   Hyperthyroidism    GERD 11/02/2007   Moderate persistent asthma without complication 10/31/2007    History obtained from: chart review and patient.  Discussed the use of AI scribe software for clinical note transcription with the patient and/or guardian, who gave verbal consent to proceed.  Brianna Santana is a 68 y.o. female presenting for a follow up visit.  She was last seen in January 2025.  At that time, her lung was consistent with some air trapping but she was feeling fine.  We sent in a prescription for Trelegy and we recommended that she do this every day.  We continue with the Singulair   as well as albuterol  as needed.  For her rhinitis, she was doing well with Astelin  as needed.  She completed 5 years of allergen immunotherapy.  GERD was under good control with Dexilant  and Pepcid .  Since last visit, she has not done well.   Asthma/Respiratory Symptom History: She has been using various inhalers, including Advair and Breo, and has Spiriva  at home. She is uncertain about her use of Trelegy, as it was not covered by the [place], and she had to  purchase it herself. She also mentions using a medication similar to Advair, provided by the [place]. Her current inhalers provide some relief, but she continues to experience significant respiratory symptoms, possibly exacerbated by her allergies. She has never been on a biologic.  She has a history of upper respiratory issues, including a bacterial upper respiratory infection in February. She was prescribed a five-day course of an unspecified medication and promethazine  with codeine  for her cough. She has been using cayenne pepper in her soup to help clear her nasal congestion, which has provided some relief.  Allergic Rhinitis Symptom History: She has been experiencing worsening allergy symptoms since March, characterized by a sensation of 'a whole water in my throat' and feeling 'stopped up' on one side. Despite using cough syrup and other medications, her symptoms have persisted. She has a history of allergy shots, which were discontinued last September. Her great mom suggested restarting them due to the severity of her symptoms this year.  GERD Symptom History: She has a history of reflux and is currently on Dexilant  and Pepcid . She has a supply of famotidine  at home for her reflux symptoms. Her husband was sick in February with a bacterial upper respiratory infection, and she was symptomatic at that time. She describes herself as a 'hot mess' due to her ongoing symptoms.  Otherwise, there have been no changes to her past medical history, surgical history, family history, or social history.    Review of systems otherwise negative other than that mentioned in the HPI.    Objective:   Blood pressure 108/62, pulse 90, temperature 97.6 F (36.4 C), temperature source Temporal, resp. rate 12, SpO2 95%. There is no height or weight on file to calculate BMI.    Physical Exam Vitals reviewed.  Constitutional:      Appearance: Normal appearance. She is well-developed.     Comments: Talkative.   Smiling, but hoarse.  HENT:     Head: Normocephalic and atraumatic.     Right Ear: Tympanic membrane, ear canal and external ear normal.     Left Ear: Tympanic membrane, ear canal and external ear normal.     Nose: Nasal tenderness, mucosal edema and rhinorrhea present. No nasal deformity or septal deviation.     Right Turbinates: Enlarged, swollen and pale.     Left Turbinates: Enlarged, swollen and pale.     Right Sinus: No maxillary sinus tenderness or frontal sinus tenderness.     Left Sinus: No maxillary sinus tenderness or frontal sinus tenderness.     Mouth/Throat:     Lips: Pink.     Mouth: Mucous membranes are moist. Mucous membranes are not pale and not dry.     Pharynx: Uvula midline.     Comments: Mild cobblestoning noted. Eyes:     General: Lids are normal. Allergic shiner present.        Right eye: No discharge.        Left eye: No discharge.     Conjunctiva/sclera: Conjunctivae normal.  Right eye: Right conjunctiva is not injected. No chemosis.    Left eye: Left conjunctiva is not injected. No chemosis.    Pupils: Pupils are equal, round, and reactive to light.  Cardiovascular:     Rate and Rhythm: Normal rate and regular rhythm.     Heart sounds: Normal heart sounds.  Pulmonary:     Effort: Pulmonary effort is normal. No tachypnea, accessory muscle usage or respiratory distress.     Breath sounds: Normal breath sounds. Transmitted upper airway sounds present. No wheezing, rhonchi or rales.     Comments: Clear air movement throughout.  Chest:     Chest wall: No tenderness.  Musculoskeletal:     Cervical back: Full passive range of motion without pain.  Lymphadenopathy:     Cervical: No cervical adenopathy.  Skin:    General: Skin is warm.     Capillary Refill: Capillary refill takes less than 2 seconds.     Coloration: Skin is not pale.     Findings: No abrasion, erythema, petechiae or rash. Rash is not papular, urticarial or vesicular.  Neurological:      Mental Status: She is alert.  Psychiatric:        Behavior: Behavior is cooperative.      Diagnostic studies:    Spirometry: results normal (FEV1: 1.72/88%, FVC: 3.01/121%, FEV1/FVC: 57%).    Spirometry consistent with normal pattern.   Depo-Medrol  80 mg given in clinic today.  Allergy Studies: none        Drexel Gentles, MD  [facility] 

## 2023-09-20 NOTE — Patient Instructions (Addendum)
 1. Moderate persistent asthma, uncomplicated - Lung testing looks better today. - We are going to add on Spiriva  to see if we can get that on board to help with your Advair to work better. - We may consider the addition of injectable asthma medication.  - DepoMedrol injection given today (80mg ).  - Daily controller medication(s): Singulair  10mg  daily + Advair 250/50mcg + Spiriva  2.5mcg two puffs once daily - Rescue medications: albuterol  4 puffs every 4-6 hours as needed  - Asthma control goals:  * Full participation in all desired activities (may need albuterol  before activity) * Albuterol  use two time or less a week on average (not counting use with activity) * Cough interfering with sleep two time or less a month * Oral steroids no more than once a year * No hospitalizations  2. Chronic perennial rhinitis (cockroach, dust mite, molds) - completed 5 years maintenance  - Continue with Astelin  1-2 sprays up to twice daily. - Make an appointment for repeat skin testing in 4 weeks.  - We will plan to restart the allergy shots.    3. GERD - Continue with Dexilant  60 mg daily.  - Continue with Pepcid  40mg  twice daily as well.  4. Return in about 4 weeks (around 10/18/2023) for SKIN TESTING (1-55). You can have the follow up appointment with Dr. Idolina Maker or a Nurse Practicioner (our Nurse Practitioners are excellent and always have Physician oversight!).    Please inform us  of any Emergency Department visits, hospitalizations, or changes in symptoms. Call us  before going to the ED for breathing or allergy symptoms since we might be able to fit you in for a sick visit. Feel free to contact us  anytime with any questions, problems, or concerns.  It was a pleasure to see you again today!  Websites that have reliable patient information: 1. American Academy of Asthma, Allergy, and Immunology: www.aaaai.org 2. Food Allergy Research and Education (FARE): foodallergy.org 3. Mothers of Asthmatics:  http://www.asthmacommunitynetwork.org 4. American College of Allergy, Asthma, and Immunology: www.acaai.org      "Like" us  on Facebook and Instagram for our latest updates!      A healthy democracy works best when [employer] participate! Make sure you are registered to vote! If you have moved or changed any of your contact information, you will need to get this updated before voting! Scan the QR codes below to learn more!

## 2023-09-21 ENCOUNTER — Telehealth: Payer: Self-pay | Admitting: Allergy & Immunology

## 2023-09-21 ENCOUNTER — Other Ambulatory Visit: Payer: Self-pay

## 2023-09-21 MED ORDER — SPIRIVA RESPIMAT 2.5 MCG/ACT IN AERS: 2.0000 | INHALATION_SPRAY | RESPIRATORY_TRACT | 1 refills | Status: DC

## 2023-09-21 NOTE — Telephone Encounter (Signed)
 Patient called and stated that the prescription for her Spiriva  that was sent to the pharmacy yesterday is too expensive. The cost is $275. Patient is requesting that this prescription be sent to [facility] pharmacy, as it will be cheaper through them. Patients call back number is [phone] or [phone]

## 2023-09-28 ENCOUNTER — Other Ambulatory Visit: Payer: Self-pay | Admitting: Family Medicine

## 2023-09-28 ENCOUNTER — Ambulatory Visit
Admission: RE | Admit: 2023-09-28 | Discharge: 2023-09-28 | Disposition: A | Discharge status: Home / Self Care | Source: Ambulatory Visit | Attending: Family Medicine

## 2023-09-28 DIAGNOSIS — R0789 Other chest pain: Secondary | ICD-10-CM

## 2023-10-05 ENCOUNTER — Telehealth (HOSPITAL_BASED_OUTPATIENT_CLINIC_OR_DEPARTMENT_OTHER): Payer: Self-pay | Admitting: Internal Medicine

## 2023-10-05 NOTE — Telephone Encounter (Signed)
 Left pt message to notify she can do PFT on Prednisone  if she is not having SOB, extreme coughing or sick with something contagious.

## 2023-10-07 ENCOUNTER — Ambulatory Visit (INDEPENDENT_AMBULATORY_CARE_PROVIDER_SITE_OTHER): Admitting: Internal Medicine

## 2023-10-07 ENCOUNTER — Ambulatory Visit (HOSPITAL_BASED_OUTPATIENT_CLINIC_OR_DEPARTMENT_OTHER): Admitting: Internal Medicine

## 2023-10-07 ENCOUNTER — Encounter: Payer: Self-pay | Admitting: Internal Medicine

## 2023-10-07 VITALS — BP 120/76 | HR 90 | Temp 98.4°F | Ht 64.0 in | Wt 179.4 lb

## 2023-10-07 DIAGNOSIS — R918 Other nonspecific abnormal finding of lung field: Secondary | ICD-10-CM

## 2023-10-07 DIAGNOSIS — J387 Other diseases of larynx: Secondary | ICD-10-CM

## 2023-10-07 DIAGNOSIS — R053 Chronic cough: Secondary | ICD-10-CM

## 2023-10-07 DIAGNOSIS — J329 Chronic sinusitis, unspecified: Secondary | ICD-10-CM

## 2023-10-07 DIAGNOSIS — U099 Post covid-19 condition, unspecified: Secondary | ICD-10-CM

## 2023-10-07 DIAGNOSIS — R490 Dysphonia: Secondary | ICD-10-CM | POA: Diagnosis not present

## 2023-10-07 DIAGNOSIS — Z8616 Personal history of COVID-19: Secondary | ICD-10-CM

## 2023-10-07 LAB — PULMONARY FUNCTION TEST
DL/VA % pred: 100 %
DL/VA: 4.18 ml/min/mmHg/L
DLCO unc % pred: 88 %
DLCO unc: 17.39 ml/min/mmHg
FEF 25-75 Post: 2.14 L/s
FEF 25-75 Pre: 1.59 L/s
FEF2575-%Change-Post: 35 %
FEF2575-%Pred-Post: 107 %
FEF2575-%Pred-Pre: 79 %
FEV1-%Change-Post: 5 %
FEV1-%Pred-Post: 94 %
FEV1-%Pred-Pre: 90 %
FEV1-Post: 2.21 L
FEV1-Pre: 2.1 L
FEV1FVC-%Change-Post: 6 %
FEV1FVC-%Pred-Pre: 100 %
FEV6-%Change-Post: 0 %
FEV6-%Pred-Post: 92 %
FEV6-%Pred-Pre: 92 %
FEV6-Post: 2.7 L
FEV6-Pre: 2.72 L
FEV6FVC-%Change-Post: 0 %
FEV6FVC-%Pred-Post: 104 %
FEV6FVC-%Pred-Pre: 103 %
FVC-%Change-Post: -1 %
FVC-%Pred-Post: 88 %
FVC-%Pred-Pre: 89 %
FVC-Post: 2.71 L
FVC-Pre: 2.75 L
Post FEV1/FVC ratio: 81 %
Post FEV6/FVC ratio: 100 %
Pre FEV1/FVC ratio: 77 %
Pre FEV6/FVC Ratio: 99 %
RV % pred: 106 %
RV: 2.29 L
TLC % pred: 101 %
TLC: 5.11 L

## 2023-10-07 MED ORDER — GABAPENTIN 100 MG PO CAPS: ORAL_CAPSULE | ORAL | 2 refills | Status: DC

## 2023-10-07 NOTE — Progress Notes (Signed)
 Full pft performed today.

## 2023-10-07 NOTE — Progress Notes (Signed)
 OV 08/02/2023  Subjective:  Patient ID: Brianna Santana, female , DOB: 1956-02-13 , age 68 y.o. , MRN: [id number] , ADDRESS: [address] [place] [address] PCP Helyn Lobstein, MD Patient Care Team: Helyn Lobstein, MD as PCP - General (Family Medicine)  This Provider for this visit: Treatment Team:  Attending Provider: Maire Scot, MD    08/02/2023 -   Chief Complaint  Patient presents with   Pulmonary Consult    Self referral. Pt c/o cough since Covid 19 dx Sept 2023. Cough is prod with clear to light yellow sputum. She states she was dxed with Asthma "years ago". She has DOE if she moves too quickly.      HPI Brianna Santana 68 y.o. -new consult for chronic cough.  She tells me that at baseline she is has a history of seasonal allergies that "gets me all the time" and this has been on since her child.  But most pronounced is that in Oct 29, 2021 she suffered from COVID-19 for which she took Paxlovid  treated as an outpatient.  Since then she has got severe chronic cough.  RSI cough score is below.  It is rated as moderate right now.  Since January she has been on Trelegy which has helped somewhat but she says she wakes up in the middle of the night cough is made worse by drinking milk or eating pork.  There is no clear-cut relieving factors.  There is chronic sinus drainage associated with it there is some wheezing associated with it there is also sputum of clear color and also occasionally yellow.  There are some mild associated shortness of breath.  Her exam nitric oxide  test today is 9 ppb..  This is normal.  She is on Trelegy.  There is no fever.  She says the cough wakes her up in the night.  Is also present in the daytime.  She does have acid reflux and is on Pepcid  and PPI She does not have hypertension - She does have chronic sinus drainage. She is not on ACE inhibitor's.   OV 10/07/2023  Subjective:  Patient ID: Brianna Santana, female , DOB:  [birth year] , age 68 y.o. , MRN: [id number] , ADDRESS: [address] [place] [address] PCP Lorella Roles, MD Patient Care Team: Lorella Roles, MD as PCP - General (Family Medicine)  This Provider for this visit: Treatment Team:  Attending Provider: Maire Scot, MD    10/07/2023 -   Chief Complaint  Patient presents with   Follow-up    F/u PFT and HRCT     HPI Brianna Santana 68 y.o. -returns for follow-up of chronic cough.  Since her last visit she says she is 80% better.  This because of promethazine  cough syrup and primary care starting her on Spiriva .  Recently somebody started on prednisone  she is taken 1 dose but this made the cough worse so she wants to stop it.  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  She also has small multiple pulmonary nodules that require follow-up.  She did have a CT sinus results are pending.  She continues to have hoarse voice.  She says she wants to taper her promethazine  to off.  In the past she is taking gabapentin and she is very interested in this.  Otherwise feeling well.  RAST allergy panel is normal.  Pulmonary function test  also normal.    Note even though she says she is 80% better her cough score is extremely high.   Dr Mitchel An Reflux Symptom Index (> 13-15 suggestive of LPR cough) 0 -> 5  =  none ->severe problem 08/02/2023 Fino 9 ppb. 10/07/2023 Reports she is 80% better after starting Spiriva  and also promethazine  cough syrup.  Hoarseness of problem with voice 4 5  Clearing  Of Throat 4 5  Excess throat mucus or feeling of post nasal drip 3 5  Difficulty swallowing food, liquid or tablets 3 4  Cough after eating or lying down 3 5  Breathing difficulties or choking episodes 3 4  Troublesome or annoying cough 4 5  Sensation of something sticking in throat or lump in throat 3 5  Heartburn, chest pain, indigestion, or stomach  acid coming up 3 5  TOTAL 30 43       Latest Reference Range & Units 08/02/23 15:15  Class Description Allergens  Comment  D Pteronyssinus IgE Class 0 kU/L <0.10  D Farinae IgE Class 0 kU/L <0.10  Cat Dander IgE Class 0 kU/L <0.10  Dog Dander IgE Class 0 kU/L <0.10  Penicillium Chrysogen IgE Class 0 kU/L <0.10  Cladosporium Herbarum IgE Class 0 kU/L <0.10  Aspergillus Fumigatus IgE Class 0 kU/L <0.10  Mucor Racemosus IgE Class 0 kU/L <0.10  Alternaria Alternata IgE Class 0 kU/L <0.10    CT sinus results are pending.   IMPRESSION: 1. Mild patchy ground-glass opacity and reticulation throughout both lungs with associated mild cylindrical bronchiectasis, most prominent in the lower lobes. Findings are compatible with a mild postinflammatory fibrosis pattern due to reported history of COVID-19 infection. 2. Several solid scattered pulmonary nodules in the right greater than left lungs, largest 0.7 cm in the medial right upper lobe. Non-contrast chest CT at 3-6 months is recommended. If the nodules are stable at time of repeat CT, then future CT at 18-24 months (from today's scan) is considered optional for low-risk patients, but is recommended for high-risk patients. This recommendation follows the consensus statement: Guidelines for Management of Incidental Pulmonary Nodules Detected on CT Images: From the Fleischner Society 2017; Radiology 2017; 284:228-243. 3. Right thyroid  4.2 cm heterogeneous hypodense nodule with associated left tracheal deviation and mild tracheal narrowing. Recommend thyroid  US . 4. Cholelithiasis. 5.  Aortic Atherosclerosis (ICD10-I70.0).     Electronically Signed   By: Levell Reach M.D.   On: 09/02/2023 16:49    PFT     Latest Ref Rng & Units 10/07/2023    7:59 AM  PFT Results  FVC-Pre L 2.75  P  FVC-Predicted Pre % 89  P  FVC-Post L 2.71  P  FVC-Predicted Post % 88  P  Pre FEV1/FVC % % 77  P  Post FEV1/FCV % % 81  P  FEV1-Pre L 2.10  P   FEV1-Predicted Pre % 90  P  FEV1-Post L 2.21  P  DLCO uncorrected ml/min/mmHg 17.39  P  DLCO UNC% % 88  P  DLVA Predicted % 100  P  TLC L 5.11  P  TLC % Predicted % 101  P  RV % Predicted % 106  P    P Preliminary result       LAB RESULTS last 96 hours No results found.       has a past medical history of Arthritis, Asthma, C. difficile colitis (2011 and 07/2010), Cervical dysplasia, Crohn disease (HCC), Crohn's disease (HCC), Hyperplastic polyp of intestine (09/2007),  Osteopenia (11/2018), Thyroid  disease, and Urinary incontinence.   reports that she quit smoking about 35 years ago. Her smoking use included cigarettes. She started smoking about 45 years ago. She has never used smokeless tobacco.  Past Surgical History:  Procedure Laterality Date   BREAST CYST ASPIRATION Right    2000   BREAST EXCISIONAL BIOPSY Right    1980's   COLONOSCOPY  09/18/2013   COLPOSCOPY     FLEXIBLE SIGMOIDOSCOPY  03/29/2012   Procedure: FLEXIBLE SIGMOIDOSCOPY;  Surgeon: Asencion Blacksmith, MD,FACG;  Location: [facility];  Service: Endoscopy;  Laterality: N/A;   GANGLION CYST EXCISION Left    POLYPECTOMY     TONSILLECTOMY     TOTAL ABDOMINAL HYSTERECTOMY  2000   TRIGGER FINGER RELEASE     WRIST SURGERY Left 2021    Allergies  Allergen Reactions   Seasonal Ic [Cholestatin]    Tramadol      Major Headaches   Amoxicillin  Itching and Rash   Levofloxacin Rash   Tramadol  Hcl Rash    Immunization History  Administered Date(s) Administered   Hepatitis B, PED/ADOLESCENT 04/11/2013, 05/10/2013, 11/12/2013   Influenza Split 04/12/2012   Influenza,inj,Quad PF,6+ Mos 05/02/2015, 04/05/2016, 02/18/2017, 02/26/2019   Influenza,inj,Quad PF,6-35 Mos 03/05/2020   PFIZER(Purple Top)SARS-COV-2 Vaccination 07/24/2019, 08/14/2019, 02/19/2020   PPD Test 03/30/2012, 11/16/2013, 12/03/2014   Pneumococcal Polysaccharide-23 03/05/2019   Pneumococcal-Unspecified 02/29/2012   Tdap 09/23/2020   Zoster,  Live 09/25/2019, 03/05/2020    Family History  Problem Relation Age of Onset   Diabetes Father    Diabetes Sister        x 2   Hypertension Sister        x 2   Diabetes Brother    Hypertension Brother    Colon cancer Neg Hx    Esophageal cancer Neg Hx    Pancreatic cancer Neg Hx    Rectal cancer Neg Hx    Stomach cancer Neg Hx    Colon polyps Neg Hx    Breast cancer Neg Hx      Current Outpatient Medications:    adalimumab  (HUMIRA , 2 PEN,) 40 MG/0.4ML pen, Inject 0.4 mLs (40 mg total) into the skin every 14 (fourteen) days., Disp: 6 each, Rfl: 3   albuterol  (VENTOLIN  HFA) 108 (90 Base) MCG/ACT inhaler, INHALE 2 PUFFS INTO THE LUNGS EVERY 4 HOURS AS NEEDED FOR SHORTNESS OF BREATH OR WHEEZING (MAXIMUM 12 PUFFS TOTAL PER 24 HOURS), Disp: 54 g, Rfl: 1   azelastine  (ASTELIN ) 0.1 % nasal spray, SPRAY TWO SPRAYS INTO EACH NOSTRIL TWICE A DAY AS NEEDED FOR RHINITIS, Disp: 90 mL, Rfl: 1   CALCIUM-MAGNESIUM -VITAMIN D  PO, Take 2 tablets by mouth daily. With Zinc, Disp: , Rfl:    cetirizine  (ZYRTEC ) 10 MG tablet, Take 1 tablet (10 mg total) by mouth 2 (two) times daily as needed for allergies (Can take an extra dose if needed during flare ups.)., Disp: 180 tablet, Rfl: 1   dexlansoprazole  (DEXILANT ) 60 MG capsule, Take 1 capsule (60 mg total) by mouth daily., Disp: 90 capsule, Rfl: 3   diphenhydrAMINE  (BENADRYL ) 25 MG tablet, Take 1 tablet (25 mg total) by mouth every 6 (six) hours as needed., Disp: 20 tablet, Rfl: 0   EPINEPHrine  (EPIPEN  2-PAK) 0.3 mg/0.3 mL IJ SOAJ injection, Inject 0.3 mg into the muscle as needed for anaphylaxis., Disp: 1 each, Rfl: 1   famotidine  (PEPCID ) 40 MG tablet, Take 1 tablet (40 mg total) by mouth 2 (two) times daily., Disp: 180 tablet, Rfl: 3  fluticasone -salmeterol (WIXELA INHUB) 250-50 MCG/ACT AEPB, Inhale 1 puff into the lungs in the morning and at bedtime., Disp: 180 each, Rfl: 1   gabapentin (NEURONTIN) 100 MG capsule, Take gabapentin 300mg  once daily x 7  days, then 300mg  twice daily x 7 days, then 300mg  three times daily to continue. If this makes you too sleepy or drowsy call us  and we will cut your medication dosing down, Disp: 90 capsule, Rfl: 2   montelukast  (SINGULAIR ) 10 MG tablet, Take 1 tablet (10 mg total) by mouth at bedtime., Disp: 90 tablet, Rfl: 1   ondansetron  (ZOFRAN -ODT) 4 MG disintegrating tablet, Take 1 tablet (4 mg total) by mouth every 8 (eight) hours as needed for nausea or vomiting., Disp: 10 tablet, Rfl: 0   potassium chloride  (K-DUR) 10 MEQ tablet, Take 1 tablet (10 mEq total) by mouth daily., Disp: 90 tablet, Rfl: 1   predniSONE  (DELTASONE ) 10 MG tablet, Take two tablets (20mg ) twice daily for three days, then one tablet (10mg ) twice daily for three days, then STOP., Disp: 18 tablet, Rfl: 0   promethazine -dextromethorphan (PROMETHAZINE -DM) 6.25-15 MG/5ML syrup, Take 5 mLs by mouth 4 (four) times daily as needed for cough., Disp: 240 mL, Rfl: 1   Tiotropium Bromide Monohydrate  (SPIRIVA  RESPIMAT) 2.5 MCG/ACT AERS, Inhale 2 puffs into the lungs 1 day or 1 dose., Disp: 3 each, Rfl: 1      Objective:   Vitals:   10/07/23 1040  BP: 120/76  Pulse: 90  Temp: 98.4 F (36.9 C)  TempSrc: Oral  SpO2: 99%  Weight: 179 lb 6.4 oz (81.4 kg)  Height: 5\' 4"  (1.626 m)    Estimated body mass index is 30.79 kg/m as calculated from the following:   Height as of this encounter: 5\' 4"  (1.626 m).   Weight as of this encounter: 179 lb 6.4 oz (81.4 kg).  @WEIGHTCHANGE @  [employer]   10/07/23 1040  Weight: 179 lb 6.4 oz (81.4 kg)     Physical Exam   General: No distress. Looks well O2 at rest: no Cane present: no Sitting in wheel chair: no Frail: no Obese: no Neuro: Alert and Oriented x 3. GCS 15. Speech normal Psych: Pleasant Resp:  Barrel Chest - no.  Wheeze - no, Crackles - no, No overt respiratory distress CVS: Normal heart sounds. Murmurs - no Ext: Stigmata of Connective Tissue Disease - no HEENT: Normal upper  airway. PEERL +. No post nasal drip. HOARSE VOICE.         Assessment:       ICD-10-CM   1. COVID-19 long hauler manifesting chronic cough  R05.3 Ambulatory referral to ENT   U09.9 Ambulatory Referral to Neuro Rehab    2. Chronic cough  R05.3 Ambulatory referral to ENT    Ambulatory Referral to Neuro Rehab    3. Hoarse voice quality  R49.0 Ambulatory referral to ENT    Ambulatory Referral to Neuro Rehab    4. Irritable larynx syndrome  J38.7 Ambulatory referral to ENT    Ambulatory Referral to Neuro Rehab    5. Chronic sinusitis, unspecified location  J32.9 Ambulatory referral to ENT    Ambulatory Referral to Neuro Rehab    6. Multiple lung nodules on CT  R91.8 Ambulatory referral to ENT    Ambulatory Referral to Neuro Rehab         Plan:     Patient Instructions     ICD-10-CM   1. COVID-19 long hauler manifesting chronic cough  R05.3  U09.9     2. Chronic cough  R05.3     3. Hoarse voice quality  R49.0     4. Irritable larynx syndrome  J38.7     5. Chronic sinusitis, unspecified location  J32.9     6. 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  Glad you are 80% better but still dealing with hoarse voice and also with significant clearing of the throat.  I noted that you do want to stop your promethazine  and try gabapentin instead.  Tehre are small multiople lung nodules on CT April 2025  Plan - Take gabapentin 300mg  once daily x 7 days, then 300mg  twice daily x 7 days, then 300mg  three times daily to continue. If this makes you too sleepy or drowsy call us  and we will cut your medication dosing down - Refer ENT -Referral voice rehabilitation -Await  CT sinus results - Continue Spiriva  as given by primary care - Slowly taper promethazine   - You can  stop the prednisone  - For COVID fibrosis will just keep an eye with breathing test every 6-12 months  - repeat CT chest in 6 months (will order at follwup)   Follow-up - 8-12  weeks with nurse practitioner  -RSI cough score at follow-up    FOLLOWUP Return in about 10 weeks (around 12/16/2023) for with any of the APPS, Face to Face Visit.    SIGNATURE    Dr. Maire Scot, M.D., F.C.C.P,  Pulmonary and Critical Care Medicine Staff Physician, [facility] Director - Interstitial Lung Disease  Program  Pulmonary Fibrosis [facility] at [facility] [place], [place], [address]  Pager: [phone], If no answer or between  15:00h - 7:00h: call [phone] Telephone: [phone]  11:10 AM 10/07/2023

## 2023-10-07 NOTE — Patient Instructions (Signed)
 Full pft performed today.

## 2023-10-07 NOTE — Patient Instructions (Addendum)
 ICD-10-CM   1. COVID-19 long hauler manifesting chronic cough  R05.3    U09.9     2. Chronic cough  R05.3     3. Hoarse voice quality  R49.0     4. Irritable larynx syndrome  J38.7     5. Chronic sinusitis, unspecified location  J32.9     6. 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  Glad you are 80% better but still dealing with hoarse voice and also with significant clearing of the throat.  I noted that you do want to stop your promethazine  and try gabapentin instead.  Tehre are small multiople lung nodules on CT April 2025  Plan - Take gabapentin 300mg  once daily x 7 days, then 300mg  twice daily x 7 days, then 300mg  three times daily to continue. If this makes you too sleepy or drowsy call us  and we will cut your medication dosing down - Refer ENT -Referral voice rehabilitation -Await  CT sinus results - Continue Spiriva  as given by primary care - Slowly taper promethazine   - You can stop the prednisone  - For COVID fibrosis will just keep an eye with breathing test every 6-12 months  - repeat CT chest in 6 months (will order at follwup)   Follow-up - 8-12  weeks with nurse practitioner  -RSI cough score at follow-up

## 2023-10-10 ENCOUNTER — Encounter: Payer: Self-pay | Admitting: Internal Medicine

## 2023-10-10 NOTE — Progress Notes (Signed)
 Brianna Santana, Please let her know several abnormalitis on CT Sinus. She needs to see ENT. I made a referral - please ensure it is in place. Thanks. MR Xxxx  IMPRESSION: 1. Hyperplastic paranasal sinuses with no significant paranasal sinus mucosal thickening or opacification. Patent sinus drainage pathways. 2. Symmetric nasal cavity mucosal thickening raising the possibility of rhinitis. 3. 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   Electronically Signed   By: Marlise Simpers M.D.   On: 10/08/2023 08:57

## 2023-10-10 NOTE — Progress Notes (Signed)
 I called and spoke with the pt and notified of results/recs per MR  She verbalized understanding  Referral already placed for ENT

## 2023-10-11 ENCOUNTER — Other Ambulatory Visit: Payer: Self-pay | Admitting: Family Medicine

## 2023-10-11 DIAGNOSIS — Z1231 Encounter for screening mammogram for malignant neoplasm of breast: Secondary | ICD-10-CM

## 2023-10-18 ENCOUNTER — Encounter: Payer: Self-pay | Admitting: Allergy & Immunology

## 2023-10-18 ENCOUNTER — Ambulatory Visit (INDEPENDENT_AMBULATORY_CARE_PROVIDER_SITE_OTHER): Admitting: Allergy & Immunology

## 2023-10-18 ENCOUNTER — Other Ambulatory Visit: Payer: Self-pay

## 2023-10-18 VITALS — BP 142/72 | HR 92 | Temp 99.0°F | Resp 19 | Ht 64.0 in | Wt 180.5 lb

## 2023-10-18 DIAGNOSIS — J3089 Other allergic rhinitis: Secondary | ICD-10-CM | POA: Diagnosis not present

## 2023-10-18 DIAGNOSIS — R911 Solitary pulmonary nodule: Secondary | ICD-10-CM | POA: Diagnosis not present

## 2023-10-18 DIAGNOSIS — J4540 Moderate persistent asthma, uncomplicated: Secondary | ICD-10-CM

## 2023-10-18 DIAGNOSIS — E041 Nontoxic single thyroid nodule: Secondary | ICD-10-CM

## 2023-10-18 DIAGNOSIS — K219 Gastro-esophageal reflux disease without esophagitis: Secondary | ICD-10-CM

## 2023-10-18 DIAGNOSIS — J479 Bronchiectasis, uncomplicated: Secondary | ICD-10-CM

## 2023-10-18 NOTE — Patient Instructions (Addendum)
 1. Moderate persistent asthma, uncomplicated - Lung testing not done today. - I think you are in good hands with Dr. Bertrum Brodie.  - I agree completely with his management.  - I am glad that the Spiriva  has helped with your breathing management. - Daily controller medication(s): Singulair  10mg  daily + Advair 250/50mcg + Spiriva  2.5mcg two puffs once daily - Rescue medications: albuterol  4 puffs every 4-6 hours as needed  - Asthma control goals:  * Full participation in all desired activities (may need albuterol  before activity) * Albuterol  use two time or less a week on average (not counting use with activity) * Cough interfering with sleep two time or less a month * Oral steroids no more than once a year * No hospitalizations  2. Chronic perennial rhinitis (cockroach, dust mite, molds) - completed 5 years maintenance  - Continue with Astelin  1-2 sprays up to twice daily. - We will plan to restart the allergy shots.    3. GERD - Continue with Dexilant  60 mg daily.  - Continue with Pepcid  40mg  twice daily as well.  4. Return in about 4 weeks (around 11/15/2023) for SKIN TESTING (INTRADERMAL TESTING). You can have the follow up appointment with Dr. Idolina Maker or a Nurse Practicioner (our Nurse Practitioners are excellent and always have Physician oversight!).    Please inform us  of any Emergency Department visits, hospitalizations, or changes in symptoms. Call us  before going to the ED for breathing or allergy symptoms since we might be able to fit you in for a sick visit. Feel free to contact us  anytime with any questions, problems, or concerns.  It was a pleasure to see you again today!  Websites that have reliable patient information: 1. American Academy of Asthma, Allergy, and Immunology: www.aaaai.org 2. Food Allergy Research and Education (FARE): foodallergy.org 3. Mothers of Asthmatics: http://www.asthmacommunitynetwork.org 4. American College of Allergy, Asthma, and Immunology:  www.acaai.org      "Like" us  on Facebook and Instagram for our latest updates!      A healthy democracy works best when [employer] participate! Make sure you are registered to vote! If you have moved or changed any of your contact information, you will need to get this updated before voting! Scan the QR codes below to learn more!

## 2023-10-18 NOTE — Progress Notes (Signed)
 FOLLOW UP  Date of Service/Encounter:  10/18/23   Assessment:   Moderate persistent asthma without complication - with worsening symptoms with the onset of the allergy season   Prolonged coughing illness x 2 to 3 months with bilateral maxillary sinus tenderness    Perennial allergic rhinitis  (cockroach, molds, dust mites) - on allergen immunotherapy with maintenance reached in September 2019, stopped shots in 2024 and now interested in resuming them   Current allergic flare   GERD - doing better on PPI and H2 blocker   Thyroid  nodule - with a history of this dating back to 2000 (we are ordering another thyroid  ultrasound)   Vaccinated to COVID19   Sees a "Naturalist" Mariah Shines)   Plan/Recommendations:   1. Moderate persistent asthma, uncomplicated - Lung testing not done today. - I think you are in good hands with Dr. Bertrum Brodie.  - I agree completely with his management.  - We could consider chest physiotherapy to help with mucus clearance, but will run it by Dr. Bertrum Brodie first. - We could consider an immune workup as well, we will defer until the next visit for that. - Previous workup for serious causes of asthma was negative in June or 2024. - I am glad that the Spiriva  has helped with your breathing management. - Daily controller medication(s): Singulair  10mg  daily + Advair 250/50mcg + Spiriva  2.5mcg two puffs once daily - Rescue medications: albuterol  4 puffs every 4-6 hours as needed  - Asthma control goals:  * Full participation in all desired activities (may need albuterol  before activity) * Albuterol  use two time or less a week on average (not counting use with activity) * Cough interfering with sleep two time or less a month * Oral steroids no more than once a year * No hospitalizations  2. Chronic perennial rhinitis (cockroach, dust mite, molds) - completed 5 years maintenance  - Continue with Astelin  1-2 sprays up to twice daily. - We will plan to restart the  allergy shots.  - She we will stop her antihistamines before the next visit. - We we will plan for intradermal testing since previous blood work was negative.   3. GERD - Continue with Dexilant  60 mg daily.  - Continue with Pepcid  40mg  twice daily as well. - This combination seems to be working well to control her symptoms.  4. Return in about 4 weeks (around 11/15/2023) for SKIN TESTING (INTRADERMAL TESTING). You can have the follow up appointment with Dr. Idolina Maker or a Nurse Practicioner (our Nurse Practitioners are excellent and always have Physician oversight!).    Total of 60 minutes, greater than 50% of which was spent in discussion of treatment and management options.    Subjective:   Brianna Santana is a 68 y.o. female presenting today for follow up of  Chief Complaint  Patient presents with   Follow-up    Patients states she is still coughing.     Brianna Santana has a history of the following: Patient Active Problem List   Diagnosis Date Noted   COVID-19 virus infection 02/24/2022   Moderate persistent asthma with (acute) exacerbation 02/24/2022   Trigger thumb, left thumb 03/26/2020   Stenosing tenosynovitis of wrist 03/26/2020   Perennial allergic rhinitis 12/20/2016   Cough 05/10/2016   Acute left-sided thoracic back pain 04/05/2016   Routine general medical examination at a health care facility 05/02/2015   Thyroid  mass 01/16/2015   Drug allergy 07/30/2014   Crohn's disease (HCC) 03/18/2014   Osteopenia  09/11/2013   Hyperthyroidism    GERD 11/02/2007   Moderate persistent asthma without complication 10/31/2007    History obtained from: chart review and patient.  Discussed the use of AI scribe software for clinical note transcription with the patient and/or guardian, who gave verbal consent to proceed.  Brianna Santana is a 68 y.o. female presenting for a follow up visit. She was last seen on April 22nd.  At that time, we added on Spiriva  to see if  that might help her Advair worked better.  We also talked about doing an injectable asthma medication.  For her rhinitis, we continue with Astelin  and talked about restarting her allergy shots.  We also talked about doing a repeat debridement allergy testing.  Since last visit, she has been about the same.   She reports a significant life event with the recent passing of her nephew, which has added emotional stress. She also mentions a history of being raised by her uncle in [place]  after her mother's passing, indicating a complex family history.  In terms of social history, she participates in family gatherings and is involved in preparing food for these events. She also mentions wearing a mask regularly.     Asthma/Respiratory Symptom History: She has been experiencing ongoing respiratory symptoms following a COVID-19 infection in 2023. She describes a persistent cough, particularly in the mornings, and experiences spasms in her lungs. Despite feeling about 'eighty percent better', she is concerned about pulmonary nodules seen in previous imaging.  Her current medication regimen includes Spiriva , two puffs in the morning, which she finds helpful in managing her symptoms. She also uses Flonase once daily, a nebulizer twice a day, and takes Advair. Despite these treatments, she continues to experience significant coughing, which has impacted her ability to speak at times, requiring her husband to communicate on her behalf.  She uses natural remedies such as hot teas with lemon and ginger to help manage her symptoms.  She was on promethazine  cough syrup, but Dr. Bertrum Brodie recommended she stop it.  He wants her to get as much up mucus as she can.  I reviewed his note and he felt that her current clinical picture was secondary to a 2023 infection with COVID-19.  This was treated with Paxlovid  and she did not require any inpatient treatment.  However, in 2024 she had a pretty decent year until all  this coughing started in January.  She was started on gabapentin .  She was referred to ENT as well as voice rehab.  High-Resolution Chest CT: 1. Mild patchy ground-glass opacity and reticulation throughout both lungs with associated mild cylindrical bronchiectasis, most prominent in the lower lobes. Findings are compatible with a mild postinflammatory fibrosis pattern due to reported history of COVID-19 infection. 2. Several solid scattered pulmonary nodules in the right greater than left lungs, largest 0.7 cm in the medial right upper lobe. Non-contrast chest CT at 3-6 months is recommended. If the nodules are stable at time of repeat CT, then future CT at 18-24 months (from today's scan) is considered optional for low-risk patients, but is recommended for high-risk patients. This recommendation follows the consensus statement: Guidelines for Management of Incidental Pulmonary Nodules Detected on CT Images: From the Fleischner Society 2017; Radiology 2017; 284:228-243. 3. Right thyroid  4.2 cm heterogeneous hypodense nodule with associated left tracheal deviation and mild tracheal narrowing. Recommend thyroid  US . 4. Cholelithiasis. 5.  Aortic Atherosclerosis (ICD10-I70.0).  Allergic Rhinitis Symptom History: For her allergic rhinitis, she is interested in getting repeat  testing done.  She wants to restart the allergy shots after this.  However, she did take her antihistamines today.  Sinus CT:  1. Hyperplastic paranasal sinuses with no significant paranasal sinus mucosal thickening or opacification. Patent sinus drainage pathways. 2. Symmetric nasal cavity mucosal thickening raising the possibility of rhinitis. 3. 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  Her chest CT was also notable for a 4.2 cm heterogeneous hypodense nodule with left tracheal deviation and mild tracheal narrowing.  They report recommended doing a thyroid   ultrasound.  The patient reports that she has never had any thyroid  issues.  She was seen by Dr. Washington Hacker years ago but never started on any thyroid  medication.  Review of the notes show that she had a thyroid  mass in the right side diagnosed in 2000.  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  She had remained euthyroid off of any medications for a couple of years.  Her last thyroid  ultrasound was in October 2021.  It showed stable thyromegaly with nodules.  Nodules at the time range from 2.7 cm up to 4.5 cm.  GERD Symptom History: She remains on Pepcid  as well as a PPI.  This combination seems to be working fairly well.   She reports a significant life event with the recent passing of her nephew, which has added emotional stress. She also mentions a history of being raised by her uncle in [place]  after her mother's passing, indicating a complex family history.  In terms of social history, she participates in family gatherings and is involved in preparing food for these events. She also mentions wearing a mask regularly.   Otherwise, there have been no changes to her past medical history, surgical history, family history, or social history.    Review of systems otherwise negative other than that mentioned in the HPI.    Objective:   Blood pressure (!) 142/72, pulse 92, temperature 99 F (37.2 C), temperature source Temporal, resp. rate 19, height 5\' 4"  (1.626 m), weight 180 lb 8 oz (81.9 kg), SpO2 96%. Body mass index is 30.98 kg/m.    Physical Exam Vitals reviewed.  Constitutional:      Appearance: Normal appearance. She is well-developed.     Comments: Talkative.  Smiling, but hoarse.  HENT:     Head: Normocephalic and atraumatic.     Right Ear: Tympanic membrane, ear canal and external ear normal.     Left Ear: Tympanic membrane, ear canal and external ear normal.     Nose: No nasal deformity, septal deviation, nasal tenderness, mucosal  edema or rhinorrhea.     Right Turbinates: Enlarged, swollen and pale.     Left Turbinates: Enlarged, swollen and pale.     Right Sinus: No maxillary sinus tenderness or frontal sinus tenderness.     Left Sinus: No maxillary sinus tenderness or frontal sinus tenderness.     Mouth/Throat:     Lips: Pink.     Mouth: Mucous membranes are moist. Mucous membranes are not pale and not dry.     Pharynx: Uvula midline.     Comments: Mild cobblestoning noted. Eyes:     General: Lids are normal. Allergic shiner present.        Right eye: No discharge.        Left eye: No discharge.     Conjunctiva/sclera: Conjunctivae normal.     Right eye: Right conjunctiva is not injected. No  chemosis.    Left eye: Left conjunctiva is not injected. No chemosis.    Pupils: Pupils are equal, round, and reactive to light.  Cardiovascular:     Rate and Rhythm: Normal rate and regular rhythm.     Heart sounds: Normal heart sounds.  Pulmonary:     Effort: Pulmonary effort is normal. No tachypnea, accessory muscle usage or respiratory distress.     Breath sounds: Normal breath sounds. Transmitted upper airway sounds present. No wheezing, rhonchi or rales.     Comments: Coarse airway sounds throughout.  Chest:     Chest wall: No tenderness.  Musculoskeletal:     Cervical back: Full passive range of motion without pain.  Lymphadenopathy:     Cervical: No cervical adenopathy.  Skin:    General: Skin is warm.     Capillary Refill: Capillary refill takes less than 2 seconds.     Coloration: Skin is not pale.     Findings: No abrasion, erythema, petechiae or rash. Rash is not papular, urticarial or vesicular.  Neurological:     Mental Status: She is alert.  Psychiatric:        Behavior: Behavior is cooperative.      Diagnostic studies: none        Drexel Gentles, MD  [facility] 

## 2023-10-20 ENCOUNTER — Ambulatory Visit
Admission: RE | Admit: 2023-10-20 | Discharge: 2023-10-20 | Disposition: A | Discharge status: Home / Self Care | Source: Ambulatory Visit | Attending: Allergy & Immunology | Admitting: Allergy & Immunology

## 2023-10-20 DIAGNOSIS — E041 Nontoxic single thyroid nodule: Secondary | ICD-10-CM

## 2023-10-25 ENCOUNTER — Ambulatory Visit: Payer: Self-pay | Admitting: Allergy & Immunology

## 2023-11-08 NOTE — Therapy (Unsigned)
 OUTPATIENT SPEECH LANGUAGE PATHOLOGY VOICE EVALUATION   Patient Name: Brianna Santana MRN: [id number] DOB:[birth year], 68 y.o., female Today's Date: 11/09/2023  PCP: Lorella Roles MD REFERRING PROVIDER: Maire Scot, MD  END OF SESSION:  End of Session - 11/09/23 1237     Visit Number 1    Number of Visits 13    Date for SLP Re-Evaluation 01/18/24   extended for scheduling   Authorization Type [employer]    SLP Start Time 0803    SLP Stop Time  0845    SLP Time Calculation (min) 42 min    Activity Tolerance Patient tolerated treatment well             Past Medical History:  Diagnosis Date   Arthritis    Asthma    C. difficile colitis 2009/11/18 and 07/2010   Cervical dysplasia    Crohn disease (HCC)    Crohn's disease (HCC)    Hyperplastic polyp of intestine 09/2007   Osteopenia 11/2018   T score -2.0 FRAX 3.7% / 0.3%   Thyroid  disease    Urinary incontinence    Past Surgical History:  Procedure Laterality Date   BREAST CYST ASPIRATION Right    2000   BREAST EXCISIONAL BIOPSY Right    1980's   COLONOSCOPY  09/18/2013   COLPOSCOPY     FLEXIBLE SIGMOIDOSCOPY  03/29/2012   Procedure: FLEXIBLE SIGMOIDOSCOPY;  Surgeon: Asencion Blacksmith, MD,FACG;  Location: [facility];  Service: Endoscopy;  Laterality: N/A;   GANGLION CYST EXCISION Left    POLYPECTOMY     TONSILLECTOMY     TOTAL ABDOMINAL HYSTERECTOMY  2000   TRIGGER FINGER RELEASE     WRIST SURGERY Left 11/19/19   Patient Active Problem List   Diagnosis Date Noted   COVID-19 virus infection 02/24/2022   Moderate persistent asthma with (acute) exacerbation 02/24/2022   Trigger thumb, left thumb 03/26/2020   Stenosing tenosynovitis of wrist 03/26/2020   Perennial allergic rhinitis 12/20/2016   Cough 05/10/2016   Acute left-sided thoracic back pain 04/05/2016   Routine general medical examination at a health care facility 05/02/2015   Thyroid  mass 01/16/2015   Drug allergy 07/30/2014   Crohn's  disease (HCC) 03/18/2014   Osteopenia 09/11/2013   Hyperthyroidism    GERD 11/02/2007   Moderate persistent asthma without complication 10/31/2007    Onset date: 10/07/2023 (referral date)  REFERRING DIAG: R05.3,U09.9 (ICD-10-CM) - COVID-19 long hauler manifesting chronic cough R05.3 (ICD-10-CM) - Chronic cough R49.0 (ICD-10-CM) - Hoarse voice quality J38.7 (ICD-10-CM) - Irritable larynx syndrome J32.9 (ICD-10-CM) - Chronic sinusitis, unspecified location R91.8 (ICD-10-CM) - Multiple lung nodules on CT  THERAPY DIAG: Other voice and resonance disorders  Rationale for Evaluation and Treatment: Rehabilitation  SUBJECTIVE:   SUBJECTIVE STATEMENT: I've been dealing with this cough so long Pt accompanied by: self  PERTINENT HISTORY: Brianna Santana 68 y.o. -new consult for chronic cough.  She tells me that at baseline she is has a history of seasonal allergies that gets me all the time and this has been on since her child.  But most pronounced is that in 18-Nov-2021 she suffered from COVID-19 for which she took Paxlovid  treated as an outpatient.  Since then she has got severe chronic cough.  RSI cough score is below.  It is rated as moderate right now.  Since January she has been on Trelegy which has helped somewhat but she says she wakes up in the middle of the night cough is made worse by  drinking milk or eating pork.  There is no clear-cut relieving factors.  There is chronic sinus drainage associated with it there is some wheezing associated with it there is also sputum of clear color and also occasionally yellow.  There are some mild associated shortness of breath.  Her exam nitric oxide  test today is 9 ppb..  This is normal.  She is on Trelegy.  There is no fever.  She says the cough wakes her up in the night.  Is also present in the daytime.   She does have acid reflux and is on Pepcid  and PPI She does not have hypertension - She does have chronic sinus drainage. She is not on ACE  inhibitor's.  PAIN: Are you having pain? No  FALLS: Has patient fallen in last 6 months? No  LIVING ENVIRONMENT: Lives with: lives with their family Lives in: House/apartment  PLOF:Level of assistance: Independent with ADLs, Independent with IADLs Employment: Retired  PATIENT GOALS: get voice cleared back up. Stop all this coughing   OBJECTIVE:  Note: Objective measures were completed at Evaluation unless otherwise noted.  COGNITION: Overall cognitive status: Within functional limits for tasks assessed  SOCIAL HISTORY: Occupation: Retired Facilities manager) Water intake: optimal Caffeine/alcohol intake: minimal  Daily voice use: moderate (limited d/t negative feedback)  PHYSICAL SYMPTOMS: Do you have any burning, soreness, tickling, or irritation in your throat? Yes  If yes, can you describe the sensation? Tickle Do you sometimes have a sensation of a lump in your throat?  Yes - intermittent  Do you have any aching or tightness in your throat? No Do you ever feel tension in your neck area? No - noted enlarged neck area  Does your voice get tired easily? Unsure, has not noted  Do you feel as if you have to strain to produce voice? Yes Do you feel as if you need to cough or clear your throat a lot? Yes - a lot  Do you ever lose your voice completely? Yes - intermittent  Do you ever have difficulty swallowing? Yes - sometimes feels like gets choked with solids, possibly liquids  Do you have difficulty projecting your voice? Yes   PERCEPTUAL VOICE ASSESSMENT: Voice quality: hoarse, harsh, rough, strained, low vocal intensity, and vocal fatigue Vocal abuse: habitual throat clearing and chronic cough  Resonance: hyponasal Respiratory function: thoracic breathing and clavicular breathing  OBJECTIVE VOICE ASSESSMENT: Maximum phonation time for sustained ah: 14 seconds, pitch breaks - resulted in coughing  Conversational loudness average: 70 dB Conversational loudness range:  68-72 dB S/z ratio: 1.18 (Suggestive of dysfunction >1.0); questionable diplophonia? Breathy quality  PATIENT REPORTED OUTCOME MEASURES (PROM): Vocal Cord Dysfunction Questionnaire (VCD- Q): 54                                                                                                          TREATMENT DATE:  11/09/23: ST evaluation and POC complete. Initiated education and instruction of throat clear alternatives and cough suppression techniques. Modeling and handout provided. Additional education warranted.    PATIENT EDUCATION: Education details:  see above Person educated: Patient Education method: Explanation, Demonstration, and Verbal cues Education comprehension: verbalized understanding, returned demonstration, and needs further education  GOALS: Goals reviewed with patient? Yes  SHORT TERM GOALS: Target date: 12/07/2023  Pt will demonstrate throat clear alternative for 50% of opportunities in therapy given occasional min A Baseline: Goal status: INITIAL  2.  Pt will demonstrate cough suppression technique for 50% of opportunities in therapy given occasional min A  Baseline:  Goal status: INITIAL  3.  Pt will accurately demonstrate recommended vocal exercises given occasional min A (Pending ENT exam) Baseline:  Goal status: INITIAL  LONG TERM GOALS: Target date: 01/18/2024  Pt will complete daily HEP for 5/7 days Baseline:  Goal status: INITIAL  2.  Pt will report subjective decrease in throat clear by 75% by LTG date Baseline:  Goal status: INITIAL  3.  Pt will report subjective decrease in coughing by 75% by LTG date Baseline:  Goal status: INITIAL  4.  Pt will demonstrate improved vocal quality for 80% of unstructured conversations x2 given occasional min A  Baseline:  Goal status: INITIAL  5.  Pt will report improvements on VCD PROM by 2 points by LTG date Baseline:  Goal status: INITIAL   ASSESSMENT:  CLINICAL IMPRESSION: Patient Dot is a 68  y.o. F who was seen today for ST evaluation for chronic cough since 2023. Hx of allergies and GERD. Reports frequent coughing, particularly at night, with usual phlegm expectoration. Today, pt exhibited intermittent throat clears x3 and coughing x3 with subsequent mucous expectoration. Evaluation reveals moderate dysphonia. Pt's voice characterized as hoarse, rough, and intermittently breathy. Exhibited potential diplophonia during s/z task. Pt reports hoarseness impacting social communication at family events and talking on telephone. Denied dysphagia this date. ENT evaluation scheduled 11/30/23. Pt would benefit from skilled ST to address aforementioned deficits to improve QoL and enhance communication efficacy.   OBJECTIVE IMPAIRMENTS: include voice disorder and chronic cough. These impairments are limiting patient from effectively communicating at home and in community.Factors affecting potential to achieve goals and functional outcome are previous level of function. Patient will benefit from skilled SLP services to address above impairments and improve overall function.  REHAB POTENTIAL: Good  PLAN:  SLP FREQUENCY: 1-2x/week  SLP DURATION: 10 weeks  PLANNED INTERVENTIONS: Environmental controls, Cueing hierachy, Functional tasks, Multimodal communication approach, SLP instruction and feedback, Compensatory strategies, Patient/family education, [phone] Treatment of speech (30 or 45 min) , 92526 Treatment of swallowing function, and [id number]- Speech Eval Behavioral Qualitative Voice Resonance    Tamar Fairly, CCC-SLP 11/09/2023, 12:37 PM

## 2023-11-09 ENCOUNTER — Ambulatory Visit: Attending: Internal Medicine

## 2023-11-09 DIAGNOSIS — R498 Other voice and resonance disorders: Secondary | ICD-10-CM | POA: Diagnosis present

## 2023-11-09 NOTE — Patient Instructions (Signed)

## 2023-11-14 ENCOUNTER — Ambulatory Visit

## 2023-11-14 DIAGNOSIS — R498 Other voice and resonance disorders: Secondary | ICD-10-CM

## 2023-11-14 NOTE — Patient Instructions (Addendum)
 Semi-occluded vocal tract exercises (SOVTE)  These allow your vocal folds to vibrate without excess tension and promotes high placement of the voice  Use SOVTE as a warm up before prolonged speaking and vocal exercises  Watch Vocal Straw Exercises with Marcelina Sequin on YouTube: [email or web address]  Tips:  Make sure your lips are rounded and sealed around straw.  Make sure your upper body is relaxed.  Send that voice forward through the straw.   Exercises: (2-3x each exercise). Blow air through straw into water  Hum through straw into water  Hum (low to high pitch) through straw into water  Hum (high to low pitch) through straw into water   Hum "[employer]" through straw into water   Hum "happy birthday" through straw into water   A goal would be 2-3 minutes several times a day and prior to vocal exercises  As always, use good belly breathing while completing    Flow Phonation:  You have tension in your larynx limiting the air flow when you speak, resulting in a hoarse or gravelly voice. We call this pressed speech  We use flow phonation to improve proper air flow during speech. Sounds  that promote air flow include: s, z, sh, th, f, v, ch. We call this flow speech  After you complete the semi-occluded vocal tract exercises (straw exercises & gargling), complete the following twice a day  Use a tissue to focus on airflow:  Whooo 10x Shoe 10x Sue 10x  Shoe-Fu Sue-Pu He- She Fu-Sue Pu-Lu  Start with a quiet sound and alternate with a noisy sound  Ssssszzzzzzzzssssszzzzzzssszzzz  Fffffffvvvvvvfffffffvvvvvvvvfffffvvvvv  shshshshdjdjdjdjdjdjdshshshshsdjdjdjdjdj   Who are you?  Who is Bobbye Burrow?   Twenty Thirty  Forty Fifty Sixty Seventy Eighty Ninety Hundred  She sells sea shells  See Sue's shoes  Fifty-Fifty  Hit the hammer  High school hero  Hocus Pocus  To each his own  Zebra's zig zag at the zoo  [employer]  chews cheddar cheese  Choosy moms choose [employer]  Fat Freddy prefers french fries  Vince vowed to vote  Feel the furry fish  She should polish her shoes  Show them the fresh fruit  Teachers eat ripe peaches at [employer]  Not now nor never  My mama makes me muffins  No one knows Norman's nickname  See Dwain Giovanni Sleep soundly by the sea

## 2023-11-14 NOTE — Patient Instructions (Incomplete)
 1. Moderate persistent asthma, uncomplicated - Lung testing not done today. - Continue to follow up  with Dr. Bertrum Brodie.  - Daily controller medication(s): Singulair  10mg  daily + Advair 250/50mcg + Spiriva  2.5mcg two puffs once daily - Rescue medications: albuterol  4 puffs every 4-6 hours as needed  - Asthma control goals:  * Full participation in all desired activities (may need albuterol  before activity) * Albuterol  use two time or less a week on average (not counting use with activity) * Cough interfering with sleep two time or less a month * Oral steroids no more than once a year * No hospitalizations  2. Chronic perennial rhinitis (cockroach, dust mite, molds) - completed 5 years maintenance  - Continue with Astelin  1-2 sprays up to twice daily. - Skin testing today is - Copy of test given -Start avoidance measures as below    3. GERD - Continue with Dexilant  60 mg daily.  - Continue with Pepcid  40mg  twice daily as well.  4. Follow up in months or sooner if needed

## 2023-11-14 NOTE — Therapy (Signed)
 OUTPATIENT SPEECH LANGUAGE PATHOLOGY VOICE TREATMENT   Patient Name: Brianna Santana MRN: [id number] DOB:[birth year], 68 y.o., female Today's Date: 11/14/2023  PCP: Lorella Roles MD REFERRING PROVIDER: Maire Scot, MD  END OF SESSION:  End of Session - 11/14/23 0847     Visit Number 2    Number of Visits 13    Date for SLP Re-Evaluation 01/18/24    Authorization Type [employer]    SLP Start Time 0846    SLP Stop Time  0930    SLP Time Calculation (min) 44 min    Activity Tolerance Patient tolerated treatment well           Past Medical History:  Diagnosis Date   Arthritis    Asthma    C. difficile colitis 2009-12-18 and 07/2010   Cervical dysplasia    Crohn disease (HCC)    Crohn's disease (HCC)    Hyperplastic polyp of intestine 09/2007   Osteopenia 11/2018   T score -2.0 FRAX 3.7% / 0.3%   Thyroid  disease    Urinary incontinence    Past Surgical History:  Procedure Laterality Date   BREAST CYST ASPIRATION Right    2000   BREAST EXCISIONAL BIOPSY Right    1980's   COLONOSCOPY  09/18/2013   COLPOSCOPY     FLEXIBLE SIGMOIDOSCOPY  03/29/2012   Procedure: FLEXIBLE SIGMOIDOSCOPY;  Surgeon: Asencion Blacksmith, MD,FACG;  Location: [facility];  Service: Endoscopy;  Laterality: N/A;   GANGLION CYST EXCISION Left    POLYPECTOMY     TONSILLECTOMY     TOTAL ABDOMINAL HYSTERECTOMY  2000   TRIGGER FINGER RELEASE     WRIST SURGERY Left 12-19-19   Patient Active Problem List   Diagnosis Date Noted   COVID-19 virus infection 02/24/2022   Moderate persistent asthma with (acute) exacerbation 02/24/2022   Trigger thumb, left thumb 03/26/2020   Stenosing tenosynovitis of wrist 03/26/2020   Perennial allergic rhinitis 12/20/2016   Cough 05/10/2016   Acute left-sided thoracic back pain 04/05/2016   Routine general medical examination at a health care facility 05/02/2015   Thyroid  mass 01/16/2015   Drug allergy 07/30/2014   Crohn's disease (HCC) 03/18/2014    Osteopenia 09/11/2013   Hyperthyroidism    GERD 11/02/2007   Moderate persistent asthma without complication 10/31/2007    Onset date: 10/07/2023 (referral date)  REFERRING DIAG: R05.3,U09.9 (ICD-10-CM) - COVID-19 long hauler manifesting chronic cough R05.3 (ICD-10-CM) - Chronic cough R49.0 (ICD-10-CM) - Hoarse voice quality J38.7 (ICD-10-CM) - Irritable larynx syndrome J32.9 (ICD-10-CM) - Chronic sinusitis, unspecified location R91.8 (ICD-10-CM) - Multiple lung nodules on CT  THERAPY DIAG: Other voice and resonance disorders  Rationale for Evaluation and Treatment: Rehabilitation  SUBJECTIVE:   SUBJECTIVE STATEMENT: a little re: use of throat clear alternatives/cough suppression techniques  Pt accompanied by: self  PERTINENT HISTORY: Brianna Santana 68 y.o. -new consult for chronic cough.  She tells me that at baseline she is has a history of seasonal allergies that gets me all the time and this has been on since her child.  But most pronounced is that in 12/18/2021 she suffered from COVID-19 for which she took Paxlovid  treated as an outpatient.  Since then she has got severe chronic cough.  RSI cough score is below.  It is rated as moderate right now.  Since January she has been on Trelegy which has helped somewhat but she says she wakes up in the middle of the night cough is made worse by drinking milk or  eating pork.  There is no clear-cut relieving factors.  There is chronic sinus drainage associated with it there is some wheezing associated with it there is also sputum of clear color and also occasionally yellow.  There are some mild associated shortness of breath.  Her exam nitric oxide  test today is 9 ppb..  This is normal.  She is on Trelegy.  There is no fever.  She says the cough wakes her up in the night.  Is also present in the daytime.   She does have acid reflux and is on Pepcid  and PPI She does not have hypertension - She does have chronic sinus drainage. She is not on  ACE inhibitor's.  PAIN: Are you having pain? No  FALLS: Has patient fallen in last 6 months? No  LIVING ENVIRONMENT: Lives with: lives with their family Lives in: House/apartment  PLOF:Level of assistance: Independent with ADLs, Independent with IADLs Employment: Retired  PATIENT GOALS: get voice cleared back up. Stop all this coughing   OBJECTIVE:  Note: Objective measures were completed at Evaluation unless otherwise noted.  COGNITION: Overall cognitive status: Within functional limits for tasks assessed  SOCIAL HISTORY: Occupation: Retired Facilities manager) Water intake: optimal Caffeine/alcohol intake: minimal  Daily voice use: moderate (limited d/t negative feedback)  PHYSICAL SYMPTOMS: Do you have any burning, soreness, tickling, or irritation in your throat? Yes  If yes, can you describe the sensation? Tickle Do you sometimes have a sensation of a lump in your throat?  Yes - intermittent  Do you have any aching or tightness in your throat? No Do you ever feel tension in your neck area? No - noted enlarged neck area  Does your voice get tired easily? Unsure, has not noted  Do you feel as if you have to strain to produce voice? Yes Do you feel as if you need to cough or clear your throat a lot? Yes - a lot  Do you ever lose your voice completely? Yes - intermittent  Do you ever have difficulty swallowing? Yes - sometimes feels like gets choked with solids, possibly liquids  Do you have difficulty projecting your voice? Yes   PERCEPTUAL VOICE ASSESSMENT: Voice quality: hoarse, harsh, rough, strained, low vocal intensity, and vocal fatigue Vocal abuse: habitual throat clearing and chronic cough  Resonance: hyponasal Respiratory function: thoracic breathing and clavicular breathing  OBJECTIVE VOICE ASSESSMENT: Maximum phonation time for sustained ah: 14 seconds, pitch breaks - resulted in coughing  Conversational loudness average: 70 dB Conversational loudness  range: 68-72 dB S/z ratio: 1.18 (Suggestive of dysfunction >1.0); questionable diplophonia? Breathy quality  PATIENT REPORTED OUTCOME MEASURES (PROM): Vocal Cord Dysfunction Questionnaire (VCD- Q): 54                                                                                                          TREATMENT DATE:  11/14/23: Endorsed some carryover of SLP recommendations for throat clear alternatives and cough suppression techniques initiated at eval. Reviewed targeted techniques with no throat clearing and rare cough x1 evidenced today. Introduced SOVTE and flow  phonation techniques to address pressed speech and hoarseness. Usual min-mod A provided to optimize breath support/reduce breath hold, relax neck and upper body, and increase vocal intensity. Updated HEP with SOVTE and flow phonation but suspect additional education and training next session would be beneficial due to evident tension.   11/09/23: ST evaluation and POC complete. Initiated education and instruction of throat clear alternatives and cough suppression techniques. Modeling and handout provided. Additional education warranted.    PATIENT EDUCATION: Education details: see above Person educated: Patient Education method: Explanation, Demonstration, and Verbal cues Education comprehension: verbalized understanding, returned demonstration, and needs further education  GOALS: Goals reviewed with patient? Yes  SHORT TERM GOALS: Target date: 12/07/2023  Pt will demonstrate throat clear alternative for 50% of opportunities in therapy given occasional min A Baseline: Goal status: ONGOING  2.  Pt will demonstrate cough suppression technique for 50% of opportunities in therapy given occasional min A  Baseline:  Goal status: ONGOING  3.  Pt will accurately demonstrate recommended vocal exercises given occasional min A (Pending ENT exam) Baseline:  Goal status: ONGOING  LONG TERM GOALS: Target date: 01/18/2024  Pt will  complete daily HEP for 5/7 days Baseline:  Goal status: ONGOING  2.  Pt will report subjective decrease in throat clear by 75% by LTG date Baseline:  Goal status: ONGOING  3.  Pt will report subjective decrease in coughing by 75% by LTG date Baseline:  Goal status: ONGOING  4.  Pt will demonstrate improved vocal quality for 80% of unstructured conversations x2 given occasional min A  Baseline:  Goal status: ONGOING  5.  Pt will report improvements on VCD PROM by 2 points by LTG date Baseline:  Goal status: ONGOING   ASSESSMENT:  CLINICAL IMPRESSION: Patient Brianna Santana is a 68 y.o. F who was seen today for ST tx for chronic cough since 2023. Hx of allergies and GERD. Reports frequent coughing, particularly at night, with usual phlegm expectoration. Today, pt exhibited intermittent throat clears x3 and coughing x3 with subsequent mucous expectoration. Evaluation reveals moderate dysphonia. Pt's voice characterized as hoarse, rough, and intermittently breathy. Exhibited potential diplophonia during s/z task. Pt reports hoarseness impacting social communication at family events and talking on telephone. Denied dysphagia this date. ENT evaluation scheduled 11/30/23. Pt would benefit from skilled ST to address aforementioned deficits to improve QoL and enhance communication efficacy.   OBJECTIVE IMPAIRMENTS: include voice disorder and chronic cough. These impairments are limiting patient from effectively communicating at home and in community.Factors affecting potential to achieve goals and functional outcome are previous level of function. Patient will benefit from skilled SLP services to address above impairments and improve overall function.  REHAB POTENTIAL: Good  PLAN:  SLP FREQUENCY: 1-2x/week  SLP DURATION: 10 weeks  PLANNED INTERVENTIONS: Environmental controls, Cueing hierachy, Functional tasks, Multimodal communication approach, SLP instruction and feedback, Compensatory strategies,  Patient/family education, [phone] Treatment of speech (30 or 45 min) , [id number] Treatment of swallowing function, and [id number]- Speech Eval Behavioral Qualitative Voice Resonance    Tamar Fairly, CCC-SLP 11/14/2023, 8:48 AM

## 2023-11-15 ENCOUNTER — Encounter: Payer: Self-pay | Admitting: Family

## 2023-11-15 ENCOUNTER — Ambulatory Visit (INDEPENDENT_AMBULATORY_CARE_PROVIDER_SITE_OTHER): Admitting: Family

## 2023-11-15 DIAGNOSIS — J3089 Other allergic rhinitis: Secondary | ICD-10-CM | POA: Diagnosis not present

## 2023-11-15 NOTE — Progress Notes (Signed)
 Date of Service/Encounter:  11/15/23  Allergy testing appointment   Initial visit on 10/18/23, seen for moderate persistent asthma, chronic perennial allergic rhinitis and GERD.  Please see that note for additional details.  Today reports for allergy diagnostic testing:    DIAGNOSTICS:  Skin Testing: Intradermal skin testing. Adequate positive and negative controls Results discussed with patient/family.   Intradermal - 11/15/23 1300     Time Antigen Placed 1341    Allergen Manufacturer Greer    Location Arm    Number of Test 16    Intradermal Select    Control Negative    [place] Negative    [place] Negative    [place] Negative    7 Grass Negative    Ragweed Mix Negative    Weed Mix Negative    Tree Mix Negative    Mold 1 Negative    Mold 2 Negative    Mold 3 Negative    Mold 4 Negative    Mite Mix 3+    Cat Negative    Dog Negative    Cockroach 2+           Allergy testing results were read and interpreted by myself, documented by clinical staff.  Patient provided with copy of allergy testing along with avoidance measures when indicated.   1. Moderate persistent asthma, uncomplicated - Lung testing not done today. - Continue to follow up  with Dr. Bertrum Brodie.  - Daily controller medication(s): Singulair  10mg  daily + Advair 250/50mcg + Spiriva  2.5mcg two puffs once daily - Rescue medications: albuterol  4 puffs every 4-6 hours as needed  - Asthma control goals:  * Full participation in all desired activities (may need albuterol  before activity) * Albuterol  use two time or less a week on average (not counting use with activity) * Cough interfering with sleep two time or less a month * Oral steroids no more than once a year * No hospitalizations  2. Chronic perennial rhinitis (cockroach, dust mite, molds) - completed 5 years maintenance  - Continue with Astelin  1-2 sprays up to twice daily. - Intradermal skin testing today is positive to dust mite and  cockroach - Copy of test given -Start avoidance measures as below  - She is interested in restarting allergy injections. CPT codes given for traditional immunotherapy. After calling insurance to find out the cost she will contact our office to set up an appointment.   3. GERD - Continue with Dexilant  60 mg daily.  - Continue with Pepcid  40mg  twice daily as well.  4. Follow up in 3 months or sooner if needed  Control of Dust Mite Allergen Dust mites play a major role in allergic asthma and rhinitis. They occur in environments with high humidity wherever human skin is found. Dust mites absorb humidity from the atmosphere (ie, they do not drink) and feed on organic matter (including shed human and animal skin). Dust mites are a microscopic type of insect that you cannot see with the naked eye. High levels of dust mites have been detected from mattresses, pillows, carpets, upholstered furniture, bed covers, clothes, soft toys and any woven material. The principal allergen of the dust mite is found in its feces. A gram of dust may contain 1,000 mites and 250,000 fecal particles. Mite antigen is easily measured in the air during house cleaning activities. Dust mites do not bite and do not cause harm to humans, other than by triggering allergies/asthma.  Ways to decrease your exposure to dust mites in your home:  1. Encase mattresses, box springs and pillows with a mite-impermeable barrier or cover  2. Wash sheets, blankets and drapes weekly in hot water (130 F) with detergent and dry them in a dryer on the hot setting.  3. Have the room cleaned frequently with a vacuum cleaner and a damp dust-mop. For carpeting or rugs, vacuuming with a vacuum cleaner equipped with a high-efficiency particulate air (HEPA) filter. The dust mite allergic individual should not be in a room which is being cleaned and should wait 1 hour after cleaning before going into the room.  4. Do not sleep on upholstered furniture  (eg, couches).  5. If possible removing carpeting, upholstered furniture and drapery from the home is ideal. Horizontal blinds should be eliminated in the rooms where the person spends the most time (bedroom, study, television room). Washable vinyl, roller-type shades are optimal.  6. Remove all non-washable stuffed toys from the bedroom. Wash stuffed toys weekly like sheets and blankets above.  7. Reduce indoor humidity to less than 50%. Inexpensive humidity monitors can be purchased at most hardware stores. Do not use a humidifier as can make the problem worse and are not recommended.  Control of Cockroach Allergen  Cockroach allergen has been identified as an important cause of acute attacks of asthma, especially in urban settings.  There are fifty-five species of cockroach that exist in the [place] , however only three, the [place], [place] and [place] species produce allergen that can affect patients with Asthma.  Allergens can be obtained from fecal particles, egg casings and secretions from cockroaches.    Remove food sources. Reduce access to water. Seal access and entry points. Spray runways with 0.5-1% Diazinon or Chlorpyrifos Blow boric acid power under stoves and refrigerator. Place bait stations (hydramethylnon) at feeding sites.  Tinnie Forehand, FNP [facility] 

## 2023-11-16 ENCOUNTER — Ambulatory Visit: Admitting: Speech Pathology

## 2023-11-16 DIAGNOSIS — R498 Other voice and resonance disorders: Secondary | ICD-10-CM

## 2023-11-16 NOTE — Therapy (Signed)
 OUTPATIENT SPEECH LANGUAGE PATHOLOGY VOICE TREATMENT   Patient Name: Brianna Santana MRN: [id number] DOB:Jan 01, 1956, 68 y.o., female Today's Date: 11/16/2023  PCP: Lorella Roles MD REFERRING PROVIDER: Maire Scot, MD  END OF SESSION:  End of Session - 11/16/23 1320     Visit Number 3    Number of Visits 13    Date for SLP Re-Evaluation 01/18/24    Authorization Type [employer]    SLP Start Time 1315    SLP Stop Time  1400    SLP Time Calculation (min) 45 min    Activity Tolerance Patient tolerated treatment well           Past Medical History:  Diagnosis Date   Arthritis    Asthma    C. difficile colitis 08-Dec-2009 and 07/2010   Cervical dysplasia    Crohn disease (HCC)    Crohn's disease (HCC)    Hyperplastic polyp of intestine 09/2007   Osteopenia 11/2018   T score -2.0 FRAX 3.7% / 0.3%   Thyroid  disease    Urinary incontinence    Past Surgical History:  Procedure Laterality Date   BREAST CYST ASPIRATION Right    2000   BREAST EXCISIONAL BIOPSY Right    1980's   COLONOSCOPY  09/18/2013   COLPOSCOPY     FLEXIBLE SIGMOIDOSCOPY  03/29/2012   Procedure: FLEXIBLE SIGMOIDOSCOPY;  Surgeon: Asencion Blacksmith, MD,FACG;  Location: [facility];  Service: Endoscopy;  Laterality: N/A;   GANGLION CYST EXCISION Left    POLYPECTOMY     TONSILLECTOMY     TOTAL ABDOMINAL HYSTERECTOMY  2000   TRIGGER FINGER RELEASE     WRIST SURGERY Left 12/09/2019   Patient Active Problem List   Diagnosis Date Noted   COVID-19 virus infection 02/24/2022   Moderate persistent asthma with (acute) exacerbation 02/24/2022   Trigger thumb, left thumb 03/26/2020   Stenosing tenosynovitis of wrist 03/26/2020   Perennial allergic rhinitis 12/20/2016   Cough 05/10/2016   Acute left-sided thoracic back pain 04/05/2016   Routine general medical examination at a health care facility 05/02/2015   Thyroid  mass 01/16/2015   Drug allergy 07/30/2014   Crohn's disease (HCC) 03/18/2014    Osteopenia 09/11/2013   Hyperthyroidism    GERD 11/02/2007   Moderate persistent asthma without complication 10/31/2007    Onset date: 10/07/2023 (referral date)  REFERRING DIAG: R05.3,U09.9 (ICD-10-CM) - COVID-19 long hauler manifesting chronic cough R05.3 (ICD-10-CM) - Chronic cough R49.0 (ICD-10-CM) - Hoarse voice quality J38.7 (ICD-10-CM) - Irritable larynx syndrome J32.9 (ICD-10-CM) - Chronic sinusitis, unspecified location R91.8 (ICD-10-CM) - Multiple lung nodules on CT  THERAPY DIAG: Other voice and resonance disorders  Rationale for Evaluation and Treatment: Rehabilitation  SUBJECTIVE:   SUBJECTIVE STATEMENT: a little re: use of throat clear alternatives/cough suppression techniques  Pt accompanied by: self  PERTINENT HISTORY: Brianna Santana 68 y.o. -new consult for chronic cough.  She tells me that at baseline she is has a history of seasonal allergies that gets me all the time and this has been on since her child.  But most pronounced is that in 2021/12/08 she suffered from COVID-19 for which she took Paxlovid  treated as an outpatient.  Since then she has got severe chronic cough.  RSI cough score is below.  It is rated as moderate right now.  Since January she has been on Trelegy which has helped somewhat but she says she wakes up in the middle of the night cough is made worse by drinking milk or  eating pork.  There is no clear-cut relieving factors.  There is chronic sinus drainage associated with it there is some wheezing associated with it there is also sputum of clear color and also occasionally yellow.  There are some mild associated shortness of breath.  Her exam nitric oxide  test today is 9 ppb..  This is normal.  She is on Trelegy.  There is no fever.  She says the cough wakes her up in the night.  Is also present in the daytime.   She does have acid reflux and is on Pepcid  and PPI She does not have hypertension - She does have chronic sinus drainage. She is not on  ACE inhibitor's.  PAIN: Are you having pain? No  FALLS: Has patient fallen in last 6 months? No  LIVING ENVIRONMENT: Lives with: lives with their family Lives in: House/apartment  PLOF:Level of assistance: Independent with ADLs, Independent with IADLs Employment: Retired  PATIENT GOALS: get voice cleared back up. Stop all this coughing   OBJECTIVE:  Note: Objective measures were completed at Evaluation unless otherwise noted.  COGNITION: Overall cognitive status: Within functional limits for tasks assessed  SOCIAL HISTORY: Occupation: Retired Facilities manager) Water intake: optimal Caffeine/alcohol intake: minimal  Daily voice use: moderate (limited d/t negative feedback)  PHYSICAL SYMPTOMS: Do you have any burning, soreness, tickling, or irritation in your throat? Yes  If yes, can you describe the sensation? Tickle Do you sometimes have a sensation of a lump in your throat?  Yes - intermittent  Do you have any aching or tightness in your throat? No Do you ever feel tension in your neck area? No - noted enlarged neck area  Does your voice get tired easily? Unsure, has not noted  Do you feel as if you have to strain to produce voice? Yes Do you feel as if you need to cough or clear your throat a lot? Yes - a lot  Do you ever lose your voice completely? Yes - intermittent  Do you ever have difficulty swallowing? Yes - sometimes feels like gets choked with solids, possibly liquids  Do you have difficulty projecting your voice? Yes   PERCEPTUAL VOICE ASSESSMENT: Voice quality: hoarse, harsh, rough, strained, low vocal intensity, and vocal fatigue Vocal abuse: habitual throat clearing and chronic cough  Resonance: hyponasal Respiratory function: thoracic breathing and clavicular breathing  OBJECTIVE VOICE ASSESSMENT: Maximum phonation time for sustained ah: 14 seconds, pitch breaks - resulted in coughing  Conversational loudness average: 70 dB Conversational loudness  range: 68-72 dB S/z ratio: 1.18 (Suggestive of dysfunction >1.0); questionable diplophonia? Breathy quality  PATIENT REPORTED OUTCOME MEASURES (PROM): Vocal Cord Dysfunction Questionnaire (VCD- Q): 54                                                                                                          TREATMENT DATE:   11/16/23: Endorses completing HEP - today she demonstrated SOVTE in water with rare min A. Gabapentin  has reduced coughing, however Brianna Santana demonstrates abdominal sniff blow with rare min A today - verbal cues. She  reports using this to stop coughing at night. She continues to eliminate throat clearing behavior - no throat clearing today. Voice hoarse - targeted flow phonation with tissue for feedback, achieving clear phonation at syllable, word and phrase level  flow phonation phrases with occasional min verbal cues to ID and correct hoarse, pressed voice. In conversation, Brianna Santana required usual mod verbal cues to ID and self correct voice and verbal cues to ID when she is speaking on residual air.   11/14/23: Endorsed some carryover of SLP recommendations for throat clear alternatives and cough suppression techniques initiated at eval. Reviewed targeted techniques with no throat clearing and rare cough x1 evidenced today. Introduced SOVTE and flow phonation techniques to address pressed speech and hoarseness. Usual min-mod A provided to optimize breath support/reduce breath hold, relax neck and upper body, and increase vocal intensity. Updated HEP with SOVTE and flow phonation but suspect additional education and training next session would be beneficial due to evident tension.   11/09/23: ST evaluation and POC complete. Initiated education and instruction of throat clear alternatives and cough suppression techniques. Modeling and handout provided. Additional education warranted.    PATIENT EDUCATION: Education details: see above Person educated: Patient Education method:  Explanation, Demonstration, and Verbal cues Education comprehension: verbalized understanding, returned demonstration, and needs further education  GOALS: Goals reviewed with patient? Yes  SHORT TERM GOALS: Target date: 12/07/2023  Pt will demonstrate throat clear alternative for 50% of opportunities in therapy given occasional min A Baseline: Goal status: ONGOING  2.  Pt will demonstrate cough suppression technique for 50% of opportunities in therapy given occasional min A  Baseline:  Goal status: ONGOING  3.  Pt will accurately demonstrate recommended vocal exercises given occasional min A (Pending ENT exam) Baseline:  Goal status: ONGOING  LONG TERM GOALS: Target date: 01/18/2024  Pt will complete daily HEP for 5/7 days Baseline:  Goal status: ONGOING  2.  Pt will report subjective decrease in throat clear by 75% by LTG date Baseline:  Goal status: ONGOING  3.  Pt will report subjective decrease in coughing by 75% by LTG date Baseline:  Goal status: ONGOING  4.  Pt will demonstrate improved vocal quality for 80% of unstructured conversations x2 given occasional min A  Baseline:  Goal status: ONGOING  5.  Pt will report improvements on VCD PROM by 2 points by LTG date Baseline:  Goal status: ONGOING   ASSESSMENT:  CLINICAL IMPRESSION: Patient Brianna Santana is a 68 y.o. F who was seen today for ST tx for chronic cough since 2023. Hx of allergies and GERD. Reports frequent coughing, particularly at night, with usual phlegm expectoration. Today, pt exhibited intermittent throat clears x3 and coughing x3 with subsequent mucous expectoration. Evaluation reveals moderate dysphonia. Pt's voice characterized as hoarse, rough, and intermittently breathy. Exhibited potential diplophonia during s/z task. Pt reports hoarseness impacting social communication at family events and talking on telephone. Denied dysphagia this date. ENT evaluation scheduled 11/30/23. Pt would benefit from skilled ST  to address aforementioned deficits to improve QoL and enhance communication efficacy.   OBJECTIVE IMPAIRMENTS: include voice disorder and chronic cough. These impairments are limiting patient from effectively communicating at home and in community.Factors affecting potential to achieve goals and functional outcome are previous level of function. Patient will benefit from skilled SLP services to address above impairments and improve overall function.  REHAB POTENTIAL: Good  PLAN:  SLP FREQUENCY: 1-2x/week  SLP DURATION: 10 weeks  PLANNED INTERVENTIONS: Environmental controls, Cueing hierachy, Functional  tasks, Multimodal communication approach, SLP instruction and feedback, Compensatory strategies, Patient/family education, [phone] Treatment of speech (30 or 45 min) , [id number] Treatment of swallowing function, and [id number]- Speech Eval Behavioral Qualitative Voice Resonance    Belenda Bowie Dareen Ebbing, CCC-SLP 11/16/2023, 3:22 PM

## 2023-11-25 ENCOUNTER — Encounter: Payer: Self-pay | Admitting: Family

## 2023-11-25 NOTE — Addendum Note (Signed)
 Addended by: IVA MARTY SALTNESS on: 11/25/2023 08:49 AM   Modules accepted: Orders

## 2023-11-25 NOTE — Progress Notes (Signed)
Allergen immunotherapy script written.

## 2023-11-28 DIAGNOSIS — J3089 Other allergic rhinitis: Secondary | ICD-10-CM | POA: Diagnosis not present

## 2023-11-28 NOTE — Progress Notes (Signed)
 VIALS MADE 11-28-23

## 2023-11-28 NOTE — Progress Notes (Signed)
 Aeroallergen Immunotherapy  Ordering Provider: Dr. Marty Shaggy  Patient Details Name: Brianna Santana MRN: [id number] Date of Birth: August 22, 1955  Order 1 of 1  Vial Label: DM/CR  0.6 ml (Volume)  1:20 Concentration -- Cockroach, German 1.0 ml (Volume)   AU Concentration -- Mite Mix (DF 5,000 & DP 5,000)   1.6  ml Extract Subtotal 3.4  ml Diluent  5.0  ml Maintenance Total  Schedule:  C  Blue Vial (1:100,000): Schedule C (5 doses) Yellow Vial (1:10,000): Schedule C (5 doses) Green Vial (1:1,000): Schedule C (5 doses) Red Vial (1:100): Schedule C (5 doses)  Special Instructions: After completion of the first Red Vial, please space to every two weeks. After completion of the second Red Vial, please space to every 4 weeks. Ok to up dose new vials at 0.51mL --> 0.3 mL --> 0.5 mL.

## 2023-11-28 NOTE — Therapy (Unsigned)
 OUTPATIENT SPEECH LANGUAGE PATHOLOGY VOICE TREATMENT   Patient Name: Brianna Santana MRN: [id number] DOB:[birth year], 68 y.o., female Today's Date: 11/29/2023  PCP: Haze Kingfisher MD REFERRING PROVIDER: Geronimo Amel, MD  END OF SESSION:  End of Session - 11/29/23 0756     Visit Number 4    Number of Visits 13    Date for SLP Re-Evaluation 01/18/24    Authorization Type [employer]    SLP Start Time 0800    SLP Stop Time  0845    SLP Time Calculation (min) 45 min    Activity Tolerance Patient tolerated treatment well            Past Medical History:  Diagnosis Date   Arthritis    Asthma    C. difficile colitis 12/27/2009 and 07/2010   Cervical dysplasia    Crohn disease (HCC)    Crohn's disease (HCC)    Hyperplastic polyp of intestine 09/2007   Osteopenia 12/28/18   T score -2.0 FRAX 3.7% / 0.3%   Thyroid  disease    Urinary incontinence    Past Surgical History:  Procedure Laterality Date   BREAST CYST ASPIRATION Right    2000   BREAST EXCISIONAL BIOPSY Right    1980's   COLONOSCOPY  09/18/2013   COLPOSCOPY     FLEXIBLE SIGMOIDOSCOPY  03/29/2012   Procedure: FLEXIBLE SIGMOIDOSCOPY;  Surgeon: Brianna ONEIDA Buddy, MD,FACG;  Location: [facility];  Service: Endoscopy;  Laterality: N/A;   GANGLION CYST EXCISION Left    POLYPECTOMY     TONSILLECTOMY     TOTAL ABDOMINAL HYSTERECTOMY  2000   TRIGGER FINGER RELEASE     WRIST SURGERY Left December 28, 2019   Patient Active Problem List   Diagnosis Date Noted   COVID-19 virus infection 02/24/2022   Moderate persistent asthma with (acute) exacerbation 02/24/2022   Trigger thumb, left thumb 03/26/2020   Stenosing tenosynovitis of wrist 03/26/2020   Perennial allergic rhinitis 12/20/2016   Cough 05/10/2016   Acute left-sided thoracic back pain 04/05/2016   Routine general medical examination at a health care facility 05/02/2015   Thyroid  mass 01/16/2015   Drug allergy  07/30/2014   Crohn's disease (HCC) 03/18/2014    Osteopenia 09/11/2013   Hyperthyroidism    GERD 11/02/2007   Moderate persistent asthma without complication 10/31/2007    Onset date: 10/07/2023 (referral date)  REFERRING DIAG: R05.3,U09.9 (ICD-10-CM) - COVID-19 long hauler manifesting chronic cough R05.3 (ICD-10-CM) - Chronic cough R49.0 (ICD-10-CM) - Hoarse voice quality J38.7 (ICD-10-CM) - Irritable larynx syndrome J32.9 (ICD-10-CM) - Chronic sinusitis, unspecified location R91.8 (ICD-10-CM) - Multiple lung nodules on CT  THERAPY DIAG: Other voice and resonance disorders  Rationale for Evaluation and Treatment: Rehabilitation  SUBJECTIVE:   SUBJECTIVE STATEMENT: I've been practicing & a little bit better re: voice  Pt accompanied by: self  PERTINENT HISTORY: Brianna Santana 68 y.o. -new consult for chronic cough.  She tells me that at baseline she is has a history of seasonal allergies that gets me all the time and this has been on since her child.  But most pronounced is that in 2021-12-27 she suffered from COVID-19 for which she took Paxlovid  treated as an outpatient.  Since then she has got severe chronic cough.  RSI cough score is below.  It is rated as moderate right now.  Since January she has been on Trelegy which has helped somewhat but she says she wakes up in the middle of the night cough is made worse by drinking milk  or eating pork.  There is no clear-cut relieving factors.  There is chronic sinus drainage associated with it there is some wheezing associated with it there is also sputum of clear color and also occasionally yellow.  There are some mild associated shortness of breath.  Her exam nitric oxide  test today is 9 ppb..  This is normal.  She is on Trelegy.  There is no fever.  She says the cough wakes her up in the night.  Is also present in the daytime.   She does have acid reflux and is on Pepcid  and PPI She does not have hypertension - She does have chronic sinus drainage. She is not on ACE  inhibitor's.  PAIN: Are you having pain? No  FALLS: Has patient fallen in last 6 months? No  LIVING ENVIRONMENT: Lives with: lives with their family Lives in: House/apartment  PLOF:Level of assistance: Independent with ADLs, Independent with IADLs Employment: Retired  PATIENT GOALS: get voice cleared back up. Stop all this coughing   OBJECTIVE:  Note: Objective measures were completed at Evaluation unless otherwise noted.  COGNITION: Overall cognitive status: Within functional limits for tasks assessed  SOCIAL HISTORY: Occupation: Retired Facilities manager) Water intake: optimal Caffeine/alcohol intake: minimal  Daily voice use: moderate (limited d/t negative feedback)  PHYSICAL SYMPTOMS: Do you have any burning, soreness, tickling, or irritation in your throat? Yes  If yes, can you describe the sensation? Tickle Do you sometimes have a sensation of a lump in your throat?  Yes - intermittent  Do you have any aching or tightness in your throat? No Do you ever feel tension in your neck area? No - noted enlarged neck area  Does your voice get tired easily? Unsure, has not noted  Do you feel as if you have to strain to produce voice? Yes Do you feel as if you need to cough or clear your throat a lot? Yes - a lot  Do you ever lose your voice completely? Yes - intermittent  Do you ever have difficulty swallowing? Yes - sometimes feels like gets choked with solids, possibly liquids  Do you have difficulty projecting your voice? Yes   PERCEPTUAL VOICE ASSESSMENT: Voice quality: hoarse, harsh, rough, strained, low vocal intensity, and vocal fatigue Vocal abuse: habitual throat clearing and chronic cough  Resonance: hyponasal Respiratory function: thoracic breathing and clavicular breathing  OBJECTIVE VOICE ASSESSMENT: Maximum phonation time for sustained ah: 14 seconds, pitch breaks - resulted in coughing  Conversational loudness average: 70 dB Conversational loudness range:  68-72 dB S/z ratio: 1.18 (Suggestive of dysfunction >1.0); questionable diplophonia? Breathy quality  PATIENT REPORTED OUTCOME MEASURES (PROM): Vocal Cord Dysfunction Questionnaire (VCD- Q): 54                                                                                                          TREATMENT DATE:  11/29/23: Reported consistent completion of HEP. Conducted warm-up with SOVTE and flow phonation, with rare min A to complete. Introduced resonant exercises today due to evident back focused phonation, with handout provided. HEP updated  with resonant exercises recommended up to hum + words. Cough suppression techniques intermittently utilized x3 with rare cues. Intermittent throat clears evident following initial and subsequent sips of water. Usual fading to occasional cues required to swallow hard. Pt c/o persistent phlegm impacting voice and swallow function. Discussed upcoming ENT visit.   11/16/23: Endorses completing HEP - today she demonstrated SOVTE in water with rare min A. Gabapentin  has reduced coughing, however Tangela demonstrates abdominal sniff blow with rare min A today - verbal cues. She reports using this to stop coughing at night. She continues to eliminate throat clearing behavior - no throat clearing today. Voice hoarse - targeted flow phonation with tissue for feedback, achieving clear phonation at syllable, word and phrase level  flow phonation phrases with occasional min verbal cues to ID and correct hoarse, pressed voice. In conversation, Fruma required usual mod verbal cues to ID and self correct voice and verbal cues to ID when she is speaking on residual air.   11/14/23: Endorsed some carryover of SLP recommendations for throat clear alternatives and cough suppression techniques initiated at eval. Reviewed targeted techniques with no throat clearing and rare cough x1 evidenced today. Introduced SOVTE and flow phonation techniques to address pressed speech and hoarseness.  Usual min-mod A provided to optimize breath support/reduce breath hold, relax neck and upper body, and increase vocal intensity. Updated HEP with SOVTE and flow phonation but suspect additional education and training next session would be beneficial due to evident tension.   11/09/23: ST evaluation and POC complete. Initiated education and instruction of throat clear alternatives and cough suppression techniques. Modeling and handout provided. Additional education warranted.    PATIENT EDUCATION: Education details: see above Person educated: Patient Education method: Explanation, Demonstration, and Verbal cues Education comprehension: verbalized understanding, returned demonstration, and needs further education  GOALS: Goals reviewed with patient? Yes  SHORT TERM GOALS: Target date: 12/07/2023  Pt will demonstrate throat clear alternative for 50% of opportunities in therapy given occasional min A Baseline: Goal status: ONGOING  2.  Pt will demonstrate cough suppression technique for 50% of opportunities in therapy given occasional min A  Baseline:  Goal status: ONGOING  3.  Pt will accurately demonstrate recommended vocal exercises given occasional min A (Pending ENT exam) Baseline:  Goal status: ONGOING  LONG TERM GOALS: Target date: 01/18/2024  Pt will complete daily HEP for 5/7 days Baseline:  Goal status: ONGOING  2.  Pt will report subjective decrease in throat clear by 75% by LTG date Baseline:  Goal status: ONGOING  3.  Pt will report subjective decrease in coughing by 75% by LTG date Baseline:  Goal status: ONGOING  4.  Pt will demonstrate improved vocal quality for 80% of unstructured conversations x2 given occasional min A  Baseline:  Goal status: ONGOING  5.  Pt will report improvements on VCD PROM by 2 points by LTG date Baseline:  Goal status: ONGOING   ASSESSMENT:  CLINICAL IMPRESSION: Patient Dot is a 68 y.o. F who was seen today for ST tx for  chronic cough since 2023. Hx of allergies and GERD. Reports frequent coughing, particularly at night, with usual phlegm expectoration. Today, pt exhibited intermittent throat clears and coughing.  Moderate dysphonia present. Pt's voice characterized as hoarse, rough, and intermittently breathy. Pt reports hoarseness impacting social communication at family events and talking on telephone. Continued education and instruction of voice exercises and vocal hygiene protocol. Pt would benefit from skilled ST to address aforementioned deficits to improve QoL  and enhance communication efficacy.   OBJECTIVE IMPAIRMENTS: include voice disorder and chronic cough. These impairments are limiting patient from effectively communicating at home and in community.Factors affecting potential to achieve goals and functional outcome are previous level of function. Patient will benefit from skilled SLP services to address above impairments and improve overall function.  REHAB POTENTIAL: Good  PLAN:  SLP FREQUENCY: 1-2x/week  SLP DURATION: 10 weeks  PLANNED INTERVENTIONS: Environmental controls, Cueing hierachy, Functional tasks, Multimodal communication approach, SLP instruction and feedback, Compensatory strategies, Patient/family education, [phone] Treatment of speech (30 or 45 min) , 92526 Treatment of swallowing function, and [id number]- Speech Eval Behavioral Qualitative Voice Resonance    Comer LILLETTE Louder, CCC-SLP 11/29/2023, 7:56 AM

## 2023-11-29 ENCOUNTER — Ambulatory Visit: Attending: Internal Medicine

## 2023-11-29 DIAGNOSIS — R1310 Dysphagia, unspecified: Secondary | ICD-10-CM | POA: Diagnosis present

## 2023-11-29 DIAGNOSIS — R498 Other voice and resonance disorders: Secondary | ICD-10-CM | POA: Insufficient documentation

## 2023-11-29 DIAGNOSIS — R059 Cough, unspecified: Secondary | ICD-10-CM | POA: Insufficient documentation

## 2023-11-29 NOTE — Patient Instructions (Addendum)
 Resonant Voice Exercises:  1 Take a deep breath and produce a smooth, steady hum. Try to make a sound between "h" and "m". Feel for a buzz in the lips and nose.  2 Take a good belly breath and hum. Then move it gently into a vowel.  Hmmmeeee   Hmmmoooo   Hmmmohhh   Hmmmahhh   Hmmmyyyy   Hmmmayyy  3 Try moving from the hum into a single word. Keep humming into the word, it will sound like a chant.   Try Hmmmm +.. Money, Monkey, Mother, Milk, Musical, Mountain, Monster, [employer], [place], Music, Mystery, Map  4  Now take the hum and same list of words. This time hum, then try a conversational tone with the word. It should still sound smooth, but not like a chant.  5 Fade the hum and just say the words. Keep using a smooth, easy voice. If you notice roughness or hoarseness returning use a hum to return to smooth voice.  6 Again, take the list of words above. Hum, and then combine two words from the list to make a phrase. Keep humming in both words to make the chant sound.  7 Try the same phrases in a conversational voice. Keep using your hum to cue yourself into smooth voice.  8 Fade the hum and just say the phrase. Once you master this, try to change the pitch slightly. Vary it up and down, but don't go too low.  9 Hum then say the following phrases and sentences. Start by using the chant speech, then fade and just say the sentence. Keep your smooth voice.  Ronal made me mad.  My mother made marmalade.  My merry mom made marmalade.  My merry mom may marry Marv.  Marv made my mother merry.  Meet my mom. Never nap at night. Mice might make more money. Meet me at the movies. Number one, number two (continue to twenty).  Many moaning men--many, many moaning men.  Come away, come away, come. Make room, make room---the king!

## 2023-11-30 ENCOUNTER — Encounter (INDEPENDENT_AMBULATORY_CARE_PROVIDER_SITE_OTHER): Payer: Self-pay | Admitting: Otolaryngology

## 2023-11-30 ENCOUNTER — Ambulatory Visit (INDEPENDENT_AMBULATORY_CARE_PROVIDER_SITE_OTHER): Admitting: Otolaryngology

## 2023-11-30 ENCOUNTER — Other Ambulatory Visit (HOSPITAL_COMMUNITY): Payer: Self-pay | Admitting: Otolaryngology

## 2023-11-30 VITALS — BP 163/90 | HR 79

## 2023-11-30 DIAGNOSIS — R490 Dysphonia: Secondary | ICD-10-CM | POA: Diagnosis not present

## 2023-11-30 DIAGNOSIS — J383 Other diseases of vocal cords: Secondary | ICD-10-CM

## 2023-11-30 DIAGNOSIS — R1310 Dysphagia, unspecified: Secondary | ICD-10-CM

## 2023-11-30 DIAGNOSIS — Z87891 Personal history of nicotine dependence: Secondary | ICD-10-CM | POA: Diagnosis not present

## 2023-11-30 DIAGNOSIS — K219 Gastro-esophageal reflux disease without esophagitis: Secondary | ICD-10-CM

## 2023-11-30 DIAGNOSIS — R059 Cough, unspecified: Secondary | ICD-10-CM

## 2023-11-30 DIAGNOSIS — R0982 Postnasal drip: Secondary | ICD-10-CM

## 2023-11-30 DIAGNOSIS — J343 Hypertrophy of nasal turbinates: Secondary | ICD-10-CM

## 2023-11-30 DIAGNOSIS — J342 Deviated nasal septum: Secondary | ICD-10-CM

## 2023-11-30 DIAGNOSIS — J3089 Other allergic rhinitis: Secondary | ICD-10-CM

## 2023-11-30 NOTE — Progress Notes (Signed)
 ENT CONSULT:  Reason for Consult: dysphonia and dysphagia  chronic cough   HPI: Discussed the use of AI scribe software for clinical note transcription with the patient, who gave verbal consent to proceed.  History of Present Illness Brianna Santana is a 68 year old female with asthma who presents with chronic cough and changes in her voice. She was referred by her pulmonary doctor for further evaluation.  She has been experiencing a persistent cough that worsened this year, particularly during the pollen season. The cough began last year while she was in [place]  and has become more severe with the presence of airborne particles this year. It occurs throughout the day and night, with the worst episodes early in the morning and sometimes during the night. She has a history of asthma and has recently started seeing a pulmonary doctor.  She experiences changes in her voice, noting variability and hoarseness that can occur quickly. She has been working with a Human resources officer on breathing techniques to manage her symptoms. Despite these efforts, she continues to experience issues with her voice, particularly in the mornings.  She experiences difficulty swallowing, particularly when phlegm from her nose gets stuck in her throat, leading to choking episodes. Solid foods and even candy can sometimes go down the wrong way, causing prolonged coughing. She has not had a swallow test previously.  She has a history of heartburn or reflux and is currently taking Dexilant  and famotidine  for management. No masses or tumors in her throat.     Records Reviewed:  Pulm office visit Dr Geronimo 10/07/23 Brianna Santana 68 y.o. -new consult for chronic cough.  She tells me that at baseline she is has a history of seasonal allergies that gets me all the time and this has been on since her child.  But most pronounced is that in 12-17-21 she suffered from COVID-19 for which she took Paxlovid  treated as an  outpatient.  Since then she has got severe chronic cough.  RSI cough score is below.  It is rated as moderate right now.  Since January she has been on Trelegy which has helped somewhat but she says she wakes up in the middle of the night cough is made worse by drinking milk or eating pork.  There is no clear-cut relieving factors.  There is chronic sinus drainage associated with it there is some wheezing associated with it there is also sputum of clear color and also occasionally yellow.  There are some mild associated shortness of breath.  Her exam nitric oxide  test today is 9 ppb..  This is normal.  She is on Trelegy.  There is no fever.  She says the cough wakes her up in the night.  Is also present in the daytime.   She does have acid reflux and is on Pepcid  and PPI She does not have hypertension - She does have chronic sinus drainage. She is not on ACE inhibitor's.      Past Medical History:  Diagnosis Date   Arthritis    Asthma    C. difficile colitis 12-17-09 and 07/2010   Cervical dysplasia    Crohn disease (HCC)    Crohn's disease (HCC)    Hyperplastic polyp of intestine 09/2007   Osteopenia 12-18-18   T score -2.0 FRAX 3.7% / 0.3%   Thyroid  disease    Urinary incontinence     Past Surgical History:  Procedure Laterality Date   BREAST CYST ASPIRATION Right    12-18-1998  BREAST EXCISIONAL BIOPSY Right    1980's   COLONOSCOPY  09/18/2013   COLPOSCOPY     FLEXIBLE SIGMOIDOSCOPY  03/29/2012   Procedure: FLEXIBLE SIGMOIDOSCOPY;  Surgeon: Gwendlyn ONEIDA Buddy, MD,FACG;  Location: [facility];  Service: Endoscopy;  Laterality: N/A;   GANGLION CYST EXCISION Left    POLYPECTOMY     TONSILLECTOMY     TOTAL ABDOMINAL HYSTERECTOMY  2000   TRIGGER FINGER RELEASE     WRIST SURGERY Left 2021    Family History  Problem Relation Age of Onset   Diabetes Father    Diabetes Sister        x 2   Hypertension Sister        x 2   Diabetes Brother    Hypertension Brother    Colon cancer Neg Hx     Esophageal cancer Neg Hx    Pancreatic cancer Neg Hx    Rectal cancer Neg Hx    Stomach cancer Neg Hx    Colon polyps Neg Hx    Breast cancer Neg Hx     Social History:  reports that she quit smoking about 35 years ago. Her smoking use included cigarettes. She started smoking about 45 years ago. She has never used smokeless tobacco. She reports that she does not drink alcohol and does not use drugs.  Allergies:  Allergies  Allergen Reactions   Seasonal Ic [Cholestatin]    Tramadol      Major Headaches   Amoxicillin  Itching and Rash   Levofloxacin Rash   Tramadol  Hcl Rash    Medications: I have reviewed the patient's current medications.  The PMH, PSH, Medications, Allergies, and SH were reviewed and updated.  ROS: Constitutional: Negative for fever, weight loss and weight gain. Cardiovascular: Negative for chest pain and dyspnea on exertion. Respiratory: Is not experiencing shortness of breath at rest. Gastrointestinal: Negative for nausea and vomiting. Neurological: Negative for headaches. Psychiatric: The patient is not nervous/anxious  Blood pressure (!) 163/90, pulse 79, SpO2 97%. There is no height or weight on file to calculate BMI.  PHYSICAL EXAM:  Exam: General: Well-developed, well-nourished Communication and Voice: raspy Respiratory Respiratory effort: Equal inspiration and expiration without stridor Cardiovascular Peripheral Vascular: Warm extremities with equal color/perfusion Eyes: No nystagmus with equal extraocular motion bilaterally Neuro/Psych/Balance: Patient oriented to person, place, and time; Appropriate mood and affect; Gait is intact with no imbalance; Cranial nerves I-XII are intact Head and Face Inspection: Normocephalic and atraumatic without mass or lesion Palpation: Facial skeleton intact without bony stepoffs Salivary Glands: No mass or tenderness Facial Strength: Facial motility symmetric and full bilaterally ENT Pinna: External ear  intact and fully developed External canal: Canal is patent with intact skin Tympanic Membrane: Clear and mobile External Nose: No scar or anatomic deformity Internal Nose: Septum is deviated to the left. No polyp, or purulence. Mucosal edema and erythema present.  Bilateral inferior turbinate hypertrophy.  Lips, Teeth, and gums: Mucosa and teeth intact and viable TMJ: No pain to palpation with full mobility Oral cavity/oropharynx: No erythema or exudate, no lesions present Nasopharynx: No mass or lesion with intact mucosa Hypopharynx: Intact mucosa without pooling of secretions Larynx Glottic: Full true vocal cord mobility without lesion or mass VF atrophy and glottic gap with phonation Supraglottic: Normal appearing epiglottis and AE folds Interarytenoid Space: Moderate pachydermia&edema Subglottic Space: Patent without lesion or edema Neck Neck and Trachea: Midline trachea without mass or lesion Thyroid : No mass or nodularity Lymphatics: No lymphadenopathy  Procedure: Preoperative diagnosis: dysphagia  and dysphonia   Postoperative diagnosis:   Same + VF atrophy and GERD LPR  Procedure: Flexible fiberoptic laryngoscopy  Surgeon: Elena Larry, MD  Anesthesia: Topical lidocaine  and Afrin Complications: None Condition is stable throughout exam  Indications and consent:  The patient presents to the clinic with above symptoms. Indirect laryngoscopy view was incomplete. Thus it was recommended that they undergo a flexible fiberoptic laryngoscopy. All of the risks, benefits, and potential complications were reviewed with the patient preoperatively and verbal informed consent was obtained.  Procedure: The patient was seated upright in the clinic. Topical lidocaine  and Afrin were applied to the nasal cavity. After adequate anesthesia had occurred, I then proceeded to pass the flexible telescope into the nasal cavity. The nasal cavity was patent without rhinorrhea or polyp. The  nasopharynx was also patent without mass or lesion. The base of tongue was visualized and was normal. There were no signs of pooling of secretions in the piriform sinuses. The true vocal folds were mobile bilaterally. There were no signs of glottic or supraglottic mucosal lesion or mass. There was moderate interarytenoid pachydermia and post cricoid edema. The telescope was then slowly withdrawn and the patient tolerated the procedure throughout.      Studies Reviewed: 09/28/23 CXR - normal   CT chest 08/15/23 1. Mild patchy ground-glass opacity and reticulation throughout both lungs with associated mild cylindrical bronchiectasis, most prominent in the lower lobes. Findings are compatible with a mild postinflammatory fibrosis pattern due to reported history of COVID-19 infection. 2. Several solid scattered pulmonary nodules in the right greater than left lungs, largest 0.7 cm in the medial right upper lobe. Non-contrast chest CT at 3-6 months is recommended. If the nodules are stable at time of repeat CT, then future CT at 18-24 months (from today's scan) is considered optional for low-risk patients, but is recommended for high-risk patients. This recommendation follows the consensus statement: Guidelines for Management of Incidental Pulmonary Nodules Detected on CT Images: From the Fleischner Society 2017; Radiology 2017; 284:228-243. 3. Right thyroid  4.2 cm heterogeneous hypodense nodule with associated left tracheal deviation and mild tracheal narrowing. Recommend thyroid  US .  Thyroid  U/S 10/20/23 1. Continued stability of previously biopsied nodule in the right lower gland. 2. Diffusely heterogeneous and mildly enlarged background thyroid  gland with increased vascularity on color Doppler imaging. 3. No new nodules.  CT max face 08/15/23 IMPRESSION: 1. Hyperplastic paranasal sinuses with no significant paranasal sinus mucosal thickening or opacification. Patent sinus  drainage pathways. 2. Symmetric nasal cavity mucosal thickening raising the possibility of rhinitis. 3. 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  Assessment/Plan: Encounter Diagnoses  Name Primary?   Dysphagia, unspecified type Yes   Glottic insufficiency    Dysphonia    Hoarseness    Age-related vocal fold atrophy    Environmental and seasonal allergies    Post-nasal drip    Chronic GERD    Hypertrophy of both inferior nasal turbinates    Nasal septal deviation     Assessment and Plan Assessment & Plan Dysphonia  Flexible scope exam without masses lesions or VF paralysis, but with evidence of age-related VF atrophy.  - Refer to voice therapy for breath support to improve voice projection.  Chronic cough  Chronic cough in the setting of known environmental allergies and pulmonary changes of ground-glass opacities and bronchiolitis on CT chest a 08/15/23. - Continue management with pulmonary specialist. - Continue allergy  management with allergist, including nasal spray and allergy  shots.  Gastroesophageal reflux disease (GERD) GERD with heartburn and reflux symptoms. Reflux-related changes observed. On Dexilant  and famotidine . - Continue Dexilant  and famotidine . - Recommend sweet paste Reflux Gourmet after meals. - Provided lifestyle changes information in after visit summary.  Dysphagia Intermittent dysphagia to both solids and liquids, no prior swallow studies - MBS esophagram  Voice therapy  Swallow Study  RTC 3 mo   Thank you for allowing me to participate in the care of this patient. Please do not hesitate to contact me with any questions or concerns.   Elena Larry, MD Otolaryngology [facility] Phone: [phone] Fax: [phone]    11/30/2023, 1:35 PM

## 2023-11-30 NOTE — Patient Instructions (Signed)

## 2023-12-05 ENCOUNTER — Ambulatory Visit: Admitting: Speech Pathology

## 2023-12-05 ENCOUNTER — Encounter: Payer: Self-pay | Admitting: Speech Pathology

## 2023-12-05 DIAGNOSIS — R498 Other voice and resonance disorders: Secondary | ICD-10-CM | POA: Diagnosis not present

## 2023-12-05 NOTE — Patient Instructions (Signed)
   Order the Reflux Gourmet - take 1 tsp after each meal and before bed   Age related Vocal fold atrophy  Keep

## 2023-12-05 NOTE — Therapy (Signed)
 OUTPATIENT SPEECH LANGUAGE PATHOLOGY VOICE TREATMENT   Patient Name: Brianna Santana MRN: [id number] DOB:01-Oct-1955, 68 y.o., female Today's Date: 12/05/2023  PCP: Haze Kingfisher MD REFERRING PROVIDER: Geronimo Amel, MD  END OF SESSION:  End of Session - 12/05/23 0844     Visit Number 5    Number of Visits 13    Date for SLP Re-Evaluation 01/18/24    Authorization Type [employer]    SLP Start Time 0846    SLP Stop Time  0930    SLP Time Calculation (min) 44 min    Activity Tolerance Patient tolerated treatment well            Past Medical History:  Diagnosis Date   Arthritis    Asthma    C. difficile colitis 20-Dec-2009 and 07/2010   Cervical dysplasia    Crohn disease (HCC)    Crohn's disease (HCC)    Hyperplastic polyp of intestine 09/2007   Osteopenia 21-Dec-2018   T score -2.0 FRAX 3.7% / 0.3%   Thyroid  disease    Urinary incontinence    Past Surgical History:  Procedure Laterality Date   BREAST CYST ASPIRATION Right    2000   BREAST EXCISIONAL BIOPSY Right    1980's   COLONOSCOPY  09/18/2013   COLPOSCOPY     FLEXIBLE SIGMOIDOSCOPY  03/29/2012   Procedure: FLEXIBLE SIGMOIDOSCOPY;  Surgeon: Gwendlyn ONEIDA Buddy, MD,FACG;  Location: [facility];  Service: Endoscopy;  Laterality: N/A;   GANGLION CYST EXCISION Left    POLYPECTOMY     TONSILLECTOMY     TOTAL ABDOMINAL HYSTERECTOMY  2000   TRIGGER FINGER RELEASE     WRIST SURGERY Left 12/21/19   Patient Active Problem List   Diagnosis Date Noted   COVID-19 virus infection 02/24/2022   Moderate persistent asthma with (acute) exacerbation 02/24/2022   Trigger thumb, left thumb 03/26/2020   Stenosing tenosynovitis of wrist 03/26/2020   Perennial allergic rhinitis 12/20/2016   Cough 05/10/2016   Acute left-sided thoracic back pain 04/05/2016   Routine general medical examination at a health care facility 05/02/2015   Thyroid  mass 01/16/2015   Drug allergy  07/30/2014   Crohn's disease (HCC) 03/18/2014    Osteopenia 09/11/2013   Hyperthyroidism    GERD 11/02/2007   Moderate persistent asthma without complication 10/31/2007    Onset date: 10/07/2023 (referral date)  REFERRING DIAG: R05.3,U09.9 (ICD-10-CM) - COVID-19 long hauler manifesting chronic cough R05.3 (ICD-10-CM) - Chronic cough R49.0 (ICD-10-CM) - Hoarse voice quality J38.7 (ICD-10-CM) - Irritable larynx syndrome J32.9 (ICD-10-CM) - Chronic sinusitis, unspecified location R91.8 (ICD-10-CM) - Multiple lung nodules on CT  THERAPY DIAG: Other voice and resonance disorders  Rationale for Evaluation and Treatment: Rehabilitation  SUBJECTIVE:   SUBJECTIVE STATEMENT: I've been practicing & a little bit better re: voice  Pt accompanied by: self  PERTINENT HISTORY: Brianna Santana 68 y.o. -new consult for chronic cough.  She tells me that at baseline she is has a history of seasonal allergies that gets me all the time and this has been on since her child.  But most pronounced is that in 20-Dec-2021 she suffered from COVID-19 for which she took Paxlovid  treated as an outpatient.  Since then she has got severe chronic cough.  RSI cough score is below.  It is rated as moderate right now.  Since January she has been on Trelegy which has helped somewhat but she says she wakes up in the middle of the night cough is made worse by drinking milk  or eating pork.  There is no clear-cut relieving factors.  There is chronic sinus drainage associated with it there is some wheezing associated with it there is also sputum of clear color and also occasionally yellow.  There are some mild associated shortness of breath.  Her exam nitric oxide  test today is 9 ppb..  This is normal.  She is on Trelegy.  There is no fever.  She says the cough wakes her up in the night.  Is also present in the daytime.   She does have acid reflux and is on Pepcid  and PPI She does not have hypertension - She does have chronic sinus drainage. She is not on ACE  inhibitor's.  PAIN: Are you having pain? No  FALLS: Has patient fallen in last 6 months? No  LIVING ENVIRONMENT: Lives with: lives with their family Lives in: House/apartment  PLOF:Level of assistance: Independent with ADLs, Independent with IADLs Employment: Retired  PATIENT GOALS: get voice cleared back up. Stop all this coughing   OBJECTIVE:  Note: Objective measures were completed at Evaluation unless otherwise noted.  COGNITION: Overall cognitive status: Within functional limits for tasks assessed  SOCIAL HISTORY: Occupation: Retired Facilities manager) Water intake: optimal Caffeine/alcohol intake: minimal  Daily voice use: moderate (limited d/t negative feedback)  PHYSICAL SYMPTOMS: Do you have any burning, soreness, tickling, or irritation in your throat? Yes  If yes, can you describe the sensation? Tickle Do you sometimes have a sensation of a lump in your throat?  Yes - intermittent  Do you have any aching or tightness in your throat? No Do you ever feel tension in your neck area? No - noted enlarged neck area  Does your voice get tired easily? Unsure, has not noted  Do you feel as if you have to strain to produce voice? Yes Do you feel as if you need to cough or clear your throat a lot? Yes - a lot  Do you ever lose your voice completely? Yes - intermittent  Do you ever have difficulty swallowing? Yes - sometimes feels like gets choked with solids, possibly liquids  Do you have difficulty projecting your voice? Yes   PERCEPTUAL VOICE ASSESSMENT: Voice quality: hoarse, harsh, rough, strained, low vocal intensity, and vocal fatigue Vocal abuse: habitual throat clearing and chronic cough  Resonance: hyponasal Respiratory function: thoracic breathing and clavicular breathing  OBJECTIVE VOICE ASSESSMENT: Maximum phonation time for sustained ah: 14 seconds, pitch breaks - resulted in coughing  Conversational loudness average: 70 dB Conversational loudness range:  68-72 dB S/z ratio: 1.18 (Suggestive of dysfunction >1.0); questionable diplophonia? Breathy quality  PATIENT REPORTED OUTCOME MEASURES (PROM): Vocal Cord Dysfunction Questionnaire (VCD- Q): 54                                                                                                          TREATMENT DATE:   12/05/23: Reviewed ENT consult - she has not yet ordered alginate - she plans to order this today. Resonant and flow exercises achieved clear phonation with rare min A. Targeted carryover of strategies  to maintain clear phonation reading and answering conversation questions - she required usual min verbal cues, gesture cues to correct hoarse rough voice. I had her spouse come in half way through to observe cueing and that [place] can achieve clear phonation when she is mindful to carryover her resonant and flow phonation. Throat clear 2x, she required verbal cues to ID throat clearing - after throat clear, she independently used swallows and sniff-blows to avoid subsequent throat clears. She has stopped Gabapentin  and continues to report reduced coughing, mostly coughing in am and when she encounters smells - (smoke at a cookout- she reports she moved away from the smell and used relaxed sniff-blow to limit cough. MBSS scheduled 7/17.  11/29/23: Reported consistent completion of HEP. Conducted warm-up with SOVTE and flow phonation, with rare min A to complete. Introduced resonant exercises today due to evident back focused phonation, with handout provided. HEP updated with resonant exercises recommended up to hum + words. Cough suppression techniques intermittently utilized x3 with rare cues. Intermittent throat clears evident following initial and subsequent sips of water. Usual fading to occasional cues required to swallow hard. Pt c/o persistent phlegm impacting voice and swallow function. Discussed upcoming ENT visit.   11/16/23: Endorses completing HEP - today she demonstrated SOVTE in water  with rare min A. Gabapentin  has reduced coughing, however Nelma demonstrates abdominal sniff blow with rare min A today - verbal cues. She reports using this to stop coughing at night. She continues to eliminate throat clearing behavior - no throat clearing today. Voice hoarse - targeted flow phonation with tissue for feedback, achieving clear phonation at syllable, word and phrase level  flow phonation phrases with occasional min verbal cues to ID and correct hoarse, pressed voice. In conversation, Icesis required usual mod verbal cues to ID and self correct voice and verbal cues to ID when she is speaking on residual air.   11/14/23: Endorsed some carryover of SLP recommendations for throat clear alternatives and cough suppression techniques initiated at eval. Reviewed targeted techniques with no throat clearing and rare cough x1 evidenced today. Introduced SOVTE and flow phonation techniques to address pressed speech and hoarseness. Usual min-mod A provided to optimize breath support/reduce breath hold, relax neck and upper body, and increase vocal intensity. Updated HEP with SOVTE and flow phonation but suspect additional education and training next session would be beneficial due to evident tension.   11/09/23: ST evaluation and POC complete. Initiated education and instruction of throat clear alternatives and cough suppression techniques. Modeling and handout provided. Additional education warranted.    PATIENT EDUCATION: Education details: see above Person educated: Patient Education method: Explanation, Demonstration, and Verbal cues Education comprehension: verbalized understanding, returned demonstration, and needs further education  GOALS: Goals reviewed with patient? Yes  SHORT TERM GOALS: Target date: 12/07/2023  Pt will demonstrate throat clear alternative for 50% of opportunities in therapy given occasional min A Baseline: Goal status: ONGOING  2.  Pt will demonstrate cough  suppression technique for 50% of opportunities in therapy given occasional min A  Baseline:  Goal status: ONGOING  3.  Pt will accurately demonstrate recommended vocal exercises given occasional min A (Pending ENT exam) Baseline:  Goal status: ONGOING  LONG TERM GOALS: Target date: 01/18/2024  Pt will complete daily HEP for 5/7 days Baseline:  Goal status: ONGOING  2.  Pt will report subjective decrease in throat clear by 75% by LTG date Baseline:  Goal status: ONGOING  3.  Pt will report subjective decrease  in coughing by 75% by LTG date Baseline:  Goal status: ONGOING  4.  Pt will demonstrate improved vocal quality for 80% of unstructured conversations x2 given occasional min A  Baseline:  Goal status: ONGOING  5.  Pt will report improvements on VCD PROM by 2 points by LTG date Baseline:  Goal status: ONGOING   ASSESSMENT:  CLINICAL IMPRESSION: Patient Brianna Santana is a 68 y.o. F who was seen today for ST tx for chronic cough since 2023. Hx of allergies and GERD. Reports frequent coughing, particularly at night, with usual phlegm expectoration. Today, pt exhibited intermittent throat clears and coughing.  Moderate dysphonia present. Pt's voice characterized as hoarse, rough, and intermittently breathy. Pt reports hoarseness impacting social communication at family events and talking on telephone. Continued education and instruction of voice exercises and vocal hygiene protocol. Pt would benefit from skilled ST to address aforementioned deficits to improve QoL and enhance communication efficacy.   OBJECTIVE IMPAIRMENTS: include voice disorder and chronic cough. These impairments are limiting patient from effectively communicating at home and in community.Factors affecting potential to achieve goals and functional outcome are previous level of function. Patient will benefit from skilled SLP services to address above impairments and improve overall function.  REHAB POTENTIAL:  Good  PLAN:  SLP FREQUENCY: 1-2x/week  SLP DURATION: 10 weeks  PLANNED INTERVENTIONS: Environmental controls, Cueing hierachy, Functional tasks, Multimodal communication approach, SLP instruction and feedback, Compensatory strategies, Patient/family education, [phone] Treatment of speech (30 or 45 min) , [id number] Treatment of swallowing function, and [id number]- Speech Eval Behavioral Qualitative Voice Resonance    Mathis Leita Caldron, CCC-SLP 12/05/2023, 9:34 AM

## 2023-12-07 ENCOUNTER — Ambulatory Visit: Admitting: Speech Pathology

## 2023-12-07 ENCOUNTER — Encounter: Payer: Self-pay | Admitting: Speech Pathology

## 2023-12-07 DIAGNOSIS — R498 Other voice and resonance disorders: Secondary | ICD-10-CM

## 2023-12-07 NOTE — Therapy (Signed)
 OUTPATIENT SPEECH LANGUAGE PATHOLOGY VOICE TREATMENT   Patient Name: Brianna Santana MRN: [id number] DOB:December 25, 1955, 68 y.o., female Today's Date: 12/07/2023  PCP: Haze Kingfisher MD REFERRING PROVIDER: Geronimo Amel, MD  END OF SESSION:  End of Session - 12/07/23 1434     Visit Number 6    Number of Visits 13    Date for SLP Re-Evaluation 01/18/24    Authorization Type [employer]    SLP Start Time 1445    SLP Stop Time  1530    SLP Time Calculation (min) 45 min    Activity Tolerance Patient tolerated treatment well            Past Medical History:  Diagnosis Date   Arthritis    Asthma    C. difficile colitis Dec 20, 2009 and 07/2010   Cervical dysplasia    Crohn disease (HCC)    Crohn's disease (HCC)    Hyperplastic polyp of intestine 09/2007   Osteopenia 12-21-2018   T score -2.0 FRAX 3.7% / 0.3%   Thyroid  disease    Urinary incontinence    Past Surgical History:  Procedure Laterality Date   BREAST CYST ASPIRATION Right    2000   BREAST EXCISIONAL BIOPSY Right    1980's   COLONOSCOPY  09/18/2013   COLPOSCOPY     FLEXIBLE SIGMOIDOSCOPY  03/29/2012   Procedure: FLEXIBLE SIGMOIDOSCOPY;  Surgeon: Gwendlyn ONEIDA Buddy, MD,FACG;  Location: [facility];  Service: Endoscopy;  Laterality: N/A;   GANGLION CYST EXCISION Left    POLYPECTOMY     TONSILLECTOMY     TOTAL ABDOMINAL HYSTERECTOMY  2000   TRIGGER FINGER RELEASE     WRIST SURGERY Left 2019-12-21   Patient Active Problem List   Diagnosis Date Noted   COVID-19 virus infection 02/24/2022   Moderate persistent asthma with (acute) exacerbation 02/24/2022   Trigger thumb, left thumb 03/26/2020   Stenosing tenosynovitis of wrist 03/26/2020   Perennial allergic rhinitis 12/20/2016   Cough 05/10/2016   Acute left-sided thoracic back pain 04/05/2016   Routine general medical examination at a health care facility 05/02/2015   Thyroid  mass 01/16/2015   Drug allergy  07/30/2014   Crohn's disease (HCC) 03/18/2014    Osteopenia 09/11/2013   Hyperthyroidism    GERD 11/02/2007   Moderate persistent asthma without complication 10/31/2007    Onset date: 10/07/2023 (referral date)  REFERRING DIAG: R05.3,U09.9 (ICD-10-CM) - COVID-19 long hauler manifesting chronic cough R05.3 (ICD-10-CM) - Chronic cough R49.0 (ICD-10-CM) - Hoarse voice quality J38.7 (ICD-10-CM) - Irritable larynx syndrome J32.9 (ICD-10-CM) - Chronic sinusitis, unspecified location R91.8 (ICD-10-CM) - Multiple lung nodules on CT  THERAPY DIAG: Other voice and resonance disorders  Rationale for Evaluation and Treatment: Rehabilitation  SUBJECTIVE:   SUBJECTIVE STATEMENT: I've been practicing & a little bit better re: voice  Pt accompanied by: self  PERTINENT HISTORY: [name] 68 y.o. -new consult for chronic cough.  She tells me that at baseline she is has a history of seasonal allergies that gets me all the time and this has been on since her child.  But most pronounced is that in 12-20-21 she suffered from COVID-19 for which she took Paxlovid  treated as an outpatient.  Since then she has got severe chronic cough.  RSI cough score is below.  It is rated as moderate right now.  Since January she has been on Trelegy which has helped somewhat but she says she wakes up in the middle of the night cough is made worse by drinking milk  or eating pork.  There is no clear-cut relieving factors.  There is chronic sinus drainage associated with it there is some wheezing associated with it there is also sputum of clear color and also occasionally yellow.  There are some mild associated shortness of breath.  Her exam nitric oxide  test today is 9 ppb..  This is normal.  She is on Trelegy.  There is no fever.  She says the cough wakes her up in the night.  Is also present in the daytime.   She does have acid reflux and is on Pepcid  and PPI She does not have hypertension - She does have chronic sinus drainage. She is not on ACE  inhibitor's.  PAIN: Are you having pain? No  FALLS: Has patient fallen in last 6 months? No  LIVING ENVIRONMENT: Lives with: lives with their family Lives in: House/apartment  PLOF:Level of assistance: Independent with ADLs, Independent with IADLs Employment: Retired  PATIENT GOALS: get voice cleared back up. Stop all this coughing   OBJECTIVE:  Note: Objective measures were completed at Evaluation unless otherwise noted.  COGNITION: Overall cognitive status: Within functional limits for tasks assessed  SOCIAL HISTORY: Occupation: Retired Facilities manager) Water intake: optimal Caffeine/alcohol intake: minimal  Daily voice use: moderate (limited d/t negative feedback)  PHYSICAL SYMPTOMS: Do you have any burning, soreness, tickling, or irritation in your throat? Yes  If yes, can you describe the sensation? Tickle Do you sometimes have a sensation of a lump in your throat?  Yes - intermittent  Do you have any aching or tightness in your throat? No Do you ever feel tension in your neck area? No - noted enlarged neck area  Does your voice get tired easily? Unsure, has not noted  Do you feel as if you have to strain to produce voice? Yes Do you feel as if you need to cough or clear your throat a lot? Yes - a lot  Do you ever lose your voice completely? Yes - intermittent  Do you ever have difficulty swallowing? Yes - sometimes feels like gets choked with solids, possibly liquids  Do you have difficulty projecting your voice? Yes   PERCEPTUAL VOICE ASSESSMENT: Voice quality: hoarse, harsh, rough, strained, low vocal intensity, and vocal fatigue Vocal abuse: habitual throat clearing and chronic cough  Resonance: hyponasal Respiratory function: thoracic breathing and clavicular breathing  OBJECTIVE VOICE ASSESSMENT: Maximum phonation time for sustained ah: 14 seconds, pitch breaks - resulted in coughing  Conversational loudness average: 70 dB Conversational loudness range:  68-72 dB S/z ratio: 1.18 (Suggestive of dysfunction >1.0); questionable diplophonia? Breathy quality  PATIENT REPORTED OUTCOME MEASURES (PROM): Vocal Cord Dysfunction Questionnaire (VCD- Q): 54                                                                                                          TREATMENT DATE:   12/07/23: Enters with mildly hoarse voice - she completes warm up with flow phonation phrases with clear phonation and rare min A 20/20 phrases. Targeted carryover of strategies to maintain clear phonation in  conversation. ENT dx with vocal fold atrophy - trial of high intensity voice exercises for vocal fold atrophy - 10 second sustained ah! With clear phonation average 84dB - rare min A - pitch glides with high intensity voice with clear phonation rare min A  12/05/23: Reviewed ENT consult - she has not yet ordered alginate - she plans to order this today. Resonant and flow exercises achieved clear phonation with rare min A. Targeted carryover of strategies to maintain clear phonation reading and answering conversation questions - she required usual min verbal cues, gesture cues to correct hoarse rough voice. I had her spouse come in half way through to observe cueing and that Rozena can achieve clear phonation when she is mindful to carryover her resonant and flow phonation. Throat clear 2x, she required verbal cues to ID throat clearing - after throat clear, she independently used swallows and sniff-blows to avoid subsequent throat clears. She has stopped Gabapentin  and continues to report reduced coughing, mostly coughing in am and when she encounters smells - (smoke at a cookout- she reports she moved away from the smell and used relaxed sniff-blow to limit cough. MBSS scheduled 7/17.  11/29/23: Reported consistent completion of HEP. Conducted warm-up with SOVTE and flow phonation, with rare min A to complete. Introduced resonant exercises today due to evident back focused phonation, with  handout provided. HEP updated with resonant exercises recommended up to hum + words. Cough suppression techniques intermittently utilized x3 with rare cues. Intermittent throat clears evident following initial and subsequent sips of water. Usual fading to occasional cues required to swallow hard. Pt c/o persistent phlegm impacting voice and swallow function. Discussed upcoming ENT visit.   11/16/23: Endorses completing HEP - today she demonstrated SOVTE in water with rare min A. Gabapentin  has reduced coughing, however Faun demonstrates abdominal sniff blow with rare min A today - verbal cues. She reports using this to stop coughing at night. She continues to eliminate throat clearing behavior - no throat clearing today. Voice hoarse - targeted flow phonation with tissue for feedback, achieving clear phonation at syllable, word and phrase level  flow phonation phrases with occasional min verbal cues to ID and correct hoarse, pressed voice. In conversation, Mili required usual mod verbal cues to ID and self correct voice and verbal cues to ID when she is speaking on residual air.   11/14/23: Endorsed some carryover of SLP recommendations for throat clear alternatives and cough suppression techniques initiated at eval. Reviewed targeted techniques with no throat clearing and rare cough x1 evidenced today. Introduced SOVTE and flow phonation techniques to address pressed speech and hoarseness. Usual min-mod A provided to optimize breath support/reduce breath hold, relax neck and upper body, and increase vocal intensity. Updated HEP with SOVTE and flow phonation but suspect additional education and training next session would be beneficial due to evident tension.   11/09/23: ST evaluation and POC complete. Initiated education and instruction of throat clear alternatives and cough suppression techniques. Modeling and handout provided. Additional education warranted.    PATIENT EDUCATION: Education details:  see above Person educated: Patient Education method: Explanation, Demonstration, and Verbal cues Education comprehension: verbalized understanding, returned demonstration, and needs further education  GOALS: Goals reviewed with patient? Yes  SHORT TERM GOALS: Target date: 12/07/2023  Pt will demonstrate throat clear alternative for 50% of opportunities in therapy given occasional min A Baseline: Goal status: ONGOING  2.  Pt will demonstrate cough suppression technique for 50% of opportunities in therapy given occasional min  A  Baseline:  Goal status: ONGOING  3.  Pt will accurately demonstrate recommended vocal exercises given occasional min A (Pending ENT exam) Baseline:  Goal status: ONGOING  LONG TERM GOALS: Target date: 01/18/2024  Pt will complete daily HEP for 5/7 days Baseline:  Goal status: ONGOING  2.  Pt will report subjective decrease in throat clear by 75% by LTG date Baseline:  Goal status: ONGOING  3.  Pt will report subjective decrease in coughing by 75% by LTG date Baseline:  Goal status: ONGOING  4.  Pt will demonstrate improved vocal quality for 80% of unstructured conversations x2 given occasional min A  Baseline:  Goal status: ONGOING  5.  Pt will report improvements on VCD PROM by 2 points by LTG date Baseline:  Goal status: ONGOING   ASSESSMENT:  CLINICAL IMPRESSION: Patient Dot is a 68 y.o. F who was seen today for ST tx for chronic cough since 2023. Hx of allergies and GERD. Reports frequent coughing, particularly at night, with usual phlegm expectoration. Today, pt exhibited intermittent throat clears and coughing.  Moderate dysphonia present. Pt's voice characterized as hoarse, rough, and intermittently breathy. Pt reports hoarseness impacting social communication at family events and talking on telephone. Continued education and instruction of voice exercises and vocal hygiene protocol. Pt would benefit from skilled ST to address  aforementioned deficits to improve QoL and enhance communication efficacy.   OBJECTIVE IMPAIRMENTS: include voice disorder and chronic cough. These impairments are limiting patient from effectively communicating at home and in community.Factors affecting potential to achieve goals and functional outcome are previous level of function. Patient will benefit from skilled SLP services to address above impairments and improve overall function.  REHAB POTENTIAL: Good  PLAN:  SLP FREQUENCY: 1-2x/week  SLP DURATION: 10 weeks  PLANNED INTERVENTIONS: Environmental controls, Cueing hierachy, Functional tasks, Multimodal communication approach, SLP instruction and feedback, Compensatory strategies, Patient/family education, [phone] Treatment of speech (30 or 45 min) , [id number] Treatment of swallowing function, and [id number]- Speech Eval Behavioral Qualitative Voice Resonance    Mathis Leita Caldron, CCC-SLP 12/07/2023, 3:41 PM

## 2023-12-07 NOTE — Patient Instructions (Signed)
   Haaa 10 seconds with strong voice  10x  10 pitch glides up and down Haaaa     Haaaaa  10 flow sentences in a high pitch like you are calling to a neighbor over the fence  10 sentences in a deep low voice like you are the boss  Read flow sentences with good volume

## 2023-12-08 ENCOUNTER — Ambulatory Visit
Admission: RE | Admit: 2023-12-08 | Discharge: 2023-12-08 | Disposition: A | Discharge status: Home / Self Care | Source: Ambulatory Visit | Attending: Family Medicine | Admitting: Family Medicine

## 2023-12-08 DIAGNOSIS — Z1231 Encounter for screening mammogram for malignant neoplasm of breast: Secondary | ICD-10-CM

## 2023-12-12 ENCOUNTER — Ambulatory Visit (INDEPENDENT_AMBULATORY_CARE_PROVIDER_SITE_OTHER): Payer: Medicare PPO | Admitting: Nurse Practitioner

## 2023-12-12 ENCOUNTER — Encounter: Payer: Self-pay | Admitting: Nurse Practitioner

## 2023-12-12 ENCOUNTER — Ambulatory Visit: Admitting: Speech Pathology

## 2023-12-12 VITALS — BP 120/76 | HR 70 | Resp 16 | Ht 63.25 in | Wt 178.0 lb

## 2023-12-12 DIAGNOSIS — Z780 Asymptomatic menopausal state: Secondary | ICD-10-CM

## 2023-12-12 DIAGNOSIS — M8589 Other specified disorders of bone density and structure, multiple sites: Secondary | ICD-10-CM

## 2023-12-12 DIAGNOSIS — Z9189 Other specified personal risk factors, not elsewhere classified: Secondary | ICD-10-CM

## 2023-12-12 DIAGNOSIS — Z9289 Personal history of other medical treatment: Secondary | ICD-10-CM | POA: Diagnosis not present

## 2023-12-12 DIAGNOSIS — Z01419 Encounter for gynecological examination (general) (routine) without abnormal findings: Secondary | ICD-10-CM

## 2023-12-12 DIAGNOSIS — B009 Herpesviral infection, unspecified: Secondary | ICD-10-CM | POA: Diagnosis not present

## 2023-12-12 DIAGNOSIS — R498 Other voice and resonance disorders: Secondary | ICD-10-CM | POA: Diagnosis not present

## 2023-12-12 DIAGNOSIS — R059 Cough, unspecified: Secondary | ICD-10-CM

## 2023-12-12 NOTE — Progress Notes (Signed)
 Brianna Santana [birth year] [id number]   History:  68 y.o. MBF G3 P3 presents for breast and pelvic exam without GYN complaints. Postmenopausal - no HRT. S/P 2002 TAH for fibroids. Normal pap and mammogram history. Crohn's and GERD followed by GI. Primary care manages hypothyroidism, asthma.   Gynecologic History No LMP recorded. Patient has had Santana hysterectomy.   Contraception: status post hysterectomy Sexually active: Yes  Health maintenance Last Pap: 07/16/2011. Results were: Normal Last mammogram: 12/08/2023. Results were: No report yet Last colonoscopy: 11/07/2018. Results were: inflammation consistent with Crohn's disease, repeat in 5 years Last Dexa: 12/16/2020. Results were: T-score -1.6 FRAX 3.9% / 0.4%  Past medical history, past surgical history, family history and social history were all reviewed and documented in the EPIC chart. Married. Daughter in [employer] in [place], daughter in [place], son local. 10 grandchildren.   ROS:  Santana ROS was performed and pertinent positives and negatives are included.  Exam:  Vitals:   12/12/23 0952  BP: 120/76  Pulse: 70  Resp: 16  Weight: 178 lb (80.7 kg)  Height: 5' 3.25 (1.607 m)     Body mass index is 31.28 kg/m.  General appearance:  Normal Thyroid :  Symmetrical, normal in size, without palpable masses or nodularity. Respiratory  Auscultation:  Clear without wheezing or rhonchi Cardiovascular  Auscultation:  Regular rate, without rubs, murmurs or gallops  Edema/varicosities:  Not grossly evident Abdominal  Soft,nontender, without masses, guarding or rebound.  Liver/spleen:  No organomegaly noted  Hernia:  None appreciated  Skin  Inspection:  Grossly normal   Breasts: Examined lying and sitting.   Right: Without masses, retractions, discharge or axillary adenopathy.   Left: Without masses, retractions, discharge or axillary adenopathy. Pelvic: External genitalia:  no lesions              Urethra:  normal  appearing urethra with no masses, tenderness or lesions              Bartholins and Skenes: normal                 Vagina: normal appearing vagina with normal color and discharge, no lesions. Atrophic changes              Cervix: absent Bimanual Exam:  Uterus: absent              Adnexa: no mass, fullness, tenderness              Rectovaginal: Deferred              Anus:  normal, no lesions  Brianna Santana, CMA present as chaperone.   Assessment/Plan:  68 y.o. G3 P3 for breast and pelvic exam.   Encounter for breast and pelvic examination - Education provided on SBEs, importance of preventative screenings, current guidelines, high calcium diet, regular exercise, and multivitamin daily. Labs with PCP.  Postmenopausal - Plan: DG Bone Density. No HRT. S/P TAH for fibroids.   Osteopenia of multiple sites - Plan: DG Bone Density. 11/2020 T-score -1.6 without elevated FRAX. Continue calcium and vitamin D  supplement and regular exercise. Recommend repeating DXA.  Screening for cervical cancer - Normal Pap history. No longer screening per guidelines.   Screening for breast cancer - Normal mammogram history. Continue annual screenings. Normal breast exam today.  Screening for colon cancer - 2020 colonoscopy. Due now and recommend scheduling.   Return in about 1 year (around 12/11/2024) for B&P (high risk).      Brianna Santana  [facility], 10:30 AM 12/12/2023

## 2023-12-12 NOTE — Therapy (Signed)
 OUTPATIENT SPEECH LANGUAGE PATHOLOGY VOICE TREATMENT   Patient Name: Brianna Santana MRN: [id number] DOB:1955/07/13, 68 y.o., female Today's Date: 12/12/2023  PCP: Haze Kingfisher MD REFERRING PROVIDER: Geronimo Amel, MD  END OF SESSION:  End of Session - 12/12/23 1453     Visit Number 7    Number of Visits 13    Date for SLP Re-Evaluation 01/18/24    Authorization Type [employer]    SLP Start Time 1450    SLP Stop Time  1530    SLP Time Calculation (min) 40 min    Activity Tolerance Patient tolerated treatment well            Past Medical History:  Diagnosis Date   Arthritis    Asthma    C. difficile colitis January 05, 2010 and 07/2010   Cervical dysplasia    Crohn disease (HCC)    Crohn's disease (HCC)    HSV infection    Hyperplastic polyp of intestine 09/2007   Osteopenia January 06, 2019   T score -2.0 FRAX 3.7% / 0.3%   Thyroid  disease    Urinary incontinence    Past Surgical History:  Procedure Laterality Date   BREAST CYST ASPIRATION Right    2000   BREAST EXCISIONAL BIOPSY Right    1980's   COLONOSCOPY  09/18/2013   COLPOSCOPY     FLEXIBLE SIGMOIDOSCOPY  03/29/2012   Procedure: FLEXIBLE SIGMOIDOSCOPY;  Surgeon: Gwendlyn ONEIDA Buddy, MD,FACG;  Location: [facility];  Service: Endoscopy;  Laterality: N/A;   GANGLION CYST EXCISION Left    POLYPECTOMY     TONSILLECTOMY     TOTAL ABDOMINAL HYSTERECTOMY  2000   TRIGGER FINGER RELEASE     WRIST SURGERY Left 06-Jan-2020   Patient Active Problem List   Diagnosis Date Noted   COVID-19 virus infection 02/24/2022   Moderate persistent asthma with (acute) exacerbation 02/24/2022   Trigger thumb, left thumb 03/26/2020   Stenosing tenosynovitis of wrist 03/26/2020   Perennial allergic rhinitis 12/20/2016   Cough 05/10/2016   Acute left-sided thoracic back pain 04/05/2016   Routine general medical examination at a health care facility 05/02/2015   Thyroid  mass 01/16/2015   Drug allergy  07/30/2014   Crohn's disease  (HCC) 03/18/2014   Osteopenia 09/11/2013   Hyperthyroidism    GERD 11/02/2007   Moderate persistent asthma without complication 10/31/2007    Onset date: 10/07/2023 (referral date)  REFERRING DIAG: R05.3,U09.9 (ICD-10-CM) - COVID-19 long hauler manifesting chronic cough R05.3 (ICD-10-CM) - Chronic cough R49.0 (ICD-10-CM) - Hoarse voice quality J38.7 (ICD-10-CM) - Irritable larynx syndrome J32.9 (ICD-10-CM) - Chronic sinusitis, unspecified location R91.8 (ICD-10-CM) - Multiple lung nodules on CT  THERAPY DIAG: Other voice and resonance disorders  Cough, unspecified type  Rationale for Evaluation and Treatment: Rehabilitation  SUBJECTIVE:   SUBJECTIVE STATEMENT: He says I sound better re: voice  Pt accompanied by: self  PERTINENT HISTORY: Brianna Santana 68 y.o. -new consult for chronic cough.  She tells me that at baseline she is has a history of seasonal allergies that gets me all the time and this has been on since her child.  But most pronounced is that in 01-05-2022 she suffered from COVID-19 for which she took Paxlovid  treated as an outpatient.  Since then she has got severe chronic cough.  RSI cough score is below.  It is rated as moderate right now.  Since January she has been on Trelegy which has helped somewhat but she says she wakes up in the middle of the night cough  is made worse by drinking milk or eating pork.  There is no clear-cut relieving factors.  There is chronic sinus drainage associated with it there is some wheezing associated with it there is also sputum of clear color and also occasionally yellow.  There are some mild associated shortness of breath.  Her exam nitric oxide  test today is 9 ppb..  This is normal.  She is on Trelegy.  There is no fever.  She says the cough wakes her up in the night.  Is also present in the daytime.   She does have acid reflux and is on Pepcid  and PPI She does not have hypertension - She does have chronic sinus drainage. She is not  on ACE inhibitor's.  PAIN: Are you having pain? No  FALLS: Has patient fallen in last 6 months? No  LIVING ENVIRONMENT: Lives with: lives with their family Lives in: House/apartment  PLOF:Level of assistance: Independent with ADLs, Independent with IADLs Employment: Retired  PATIENT GOALS: get voice cleared back up. Stop all this coughing   OBJECTIVE:  Note: Objective measures were completed at Evaluation unless otherwise noted.  COGNITION: Overall cognitive status: Within functional limits for tasks assessed  SOCIAL HISTORY: Occupation: Retired Facilities manager) Water intake: optimal Caffeine/alcohol intake: minimal  Daily voice use: moderate (limited d/t negative feedback)  PHYSICAL SYMPTOMS: Do you have any burning, soreness, tickling, or irritation in your throat? Yes  If yes, can you describe the sensation? Tickle Do you sometimes have a sensation of a lump in your throat?  Yes - intermittent  Do you have any aching or tightness in your throat? No Do you ever feel tension in your neck area? No - noted enlarged neck area  Does your voice get tired easily? Unsure, has not noted  Do you feel as if you have to strain to produce voice? Yes Do you feel as if you need to cough or clear your throat a lot? Yes - a lot  Do you ever lose your voice completely? Yes - intermittent  Do you ever have difficulty swallowing? Yes - sometimes feels like gets choked with solids, possibly liquids  Do you have difficulty projecting your voice? Yes   PERCEPTUAL VOICE ASSESSMENT: Voice quality: hoarse, harsh, rough, strained, low vocal intensity, and vocal fatigue Vocal abuse: habitual throat clearing and chronic cough  Resonance: hyponasal Respiratory function: thoracic breathing and clavicular breathing  OBJECTIVE VOICE ASSESSMENT: Maximum phonation time for sustained ah: 14 seconds, pitch breaks - resulted in coughing  Conversational loudness average: 70 dB Conversational loudness  range: 68-72 dB S/z ratio: 1.18 (Suggestive of dysfunction >1.0); questionable diplophonia? Breathy quality  PATIENT REPORTED OUTCOME MEASURES (PROM): Vocal Cord Dysfunction Questionnaire (VCD- Q): 54                                                                                                          TREATMENT DATE:   12/12/23: Enters with clear voice - HEP for high intensity voice exercises (PhoRTE with usual mod modeling) In structured conversation exercises, Laparis maintained 72 +dB with mod  I. She uses throat clear alternatives with mod I - reports cough is over 50% improved and has started alginate - required correction to take alginate after meals as she was taking it before breakfast. In moderately complex conversation re: current events she maintained clear voice and maintained WNL volume.   12/07/23: Enters with mildly hoarse voice - she completes warm up with flow phonation phrases with clear phonation and rare min A 20/20 phrases. Targeted carryover of strategies to maintain clear phonation in conversation. ENT dx with vocal fold atrophy - trial of high intensity voice exercises for vocal fold atrophy - 10 second sustained ah! With clear phonation average 84dB - rare min A - pitch glides with high intensity voice with clear phonation rare min A  12/05/23: Reviewed ENT consult - she has not yet ordered alginate - she plans to order this today. Resonant and flow exercises achieved clear phonation with rare min A. Targeted carryover of strategies to maintain clear phonation reading and answering conversation questions - she required usual min verbal cues, gesture cues to correct hoarse rough voice. I had her spouse come in half way through to observe cueing and that Ceil can achieve clear phonation when she is mindful to carryover her resonant and flow phonation. Throat clear 2x, she required verbal cues to ID throat clearing - after throat clear, she independently used swallows and  sniff-blows to avoid subsequent throat clears. She has stopped Gabapentin  and continues to report reduced coughing, mostly coughing in am and when she encounters smells - (smoke at a cookout- she reports she moved away from the smell and used relaxed sniff-blow to limit cough. MBSS scheduled 7/17.  11/29/23: Reported consistent completion of HEP. Conducted warm-up with SOVTE and flow phonation, with rare min A to complete. Introduced resonant exercises today due to evident back focused phonation, with handout provided. HEP updated with resonant exercises recommended up to hum + words. Cough suppression techniques intermittently utilized x3 with rare cues. Intermittent throat clears evident following initial and subsequent sips of water. Usual fading to occasional cues required to swallow hard. Pt c/o persistent phlegm impacting voice and swallow function. Discussed upcoming ENT visit.   11/16/23: Endorses completing HEP - today she demonstrated SOVTE in water with rare min A. Gabapentin  has reduced coughing, however Sarahbeth demonstrates abdominal sniff blow with rare min A today - verbal cues. She reports using this to stop coughing at night. She continues to eliminate throat clearing behavior - no throat clearing today. Voice hoarse - targeted flow phonation with tissue for feedback, achieving clear phonation at syllable, word and phrase level  flow phonation phrases with occasional min verbal cues to ID and correct hoarse, pressed voice. In conversation, Annahi required usual mod verbal cues to ID and self correct voice and verbal cues to ID when she is speaking on residual air.   11/14/23: Endorsed some carryover of SLP recommendations for throat clear alternatives and cough suppression techniques initiated at eval. Reviewed targeted techniques with no throat clearing and rare cough x1 evidenced today. Introduced SOVTE and flow phonation techniques to address pressed speech and hoarseness. Usual min-mod A  provided to optimize breath support/reduce breath hold, relax neck and upper body, and increase vocal intensity. Updated HEP with SOVTE and flow phonation but suspect additional education and training next session would be beneficial due to evident tension.   11/09/23: ST evaluation and POC complete. Initiated education and instruction of throat clear alternatives and cough suppression techniques. Modeling and handout provided.  Additional education warranted.    PATIENT EDUCATION: Education details: see above Person educated: Patient Education method: Explanation, Demonstration, and Verbal cues Education comprehension: verbalized understanding, returned demonstration, and needs further education  GOALS: Goals reviewed with patient? Yes  SHORT TERM GOALS: Target date: 12/07/2023  Pt will demonstrate throat clear alternative for 50% of opportunities in therapy given occasional min A Baseline: Goal status: MET  2.  Pt will demonstrate cough suppression technique for 50% of opportunities in therapy given occasional min A  Baseline:  Goal status: MET  3.  Pt will accurately demonstrate recommended vocal exercises given occasional min A (Pending ENT exam) Baseline:  Goal status: MET  LONG TERM GOALS: Target date: 01/18/2024  Pt will complete daily HEP for 5/7 days Baseline:  Goal status: MET  2.  Pt will report subjective decrease in throat clear by 75% by LTG date Baseline:  Goal status:MET  3.  Pt will report subjective decrease in coughing by 75% by LTG date Baseline:  Goal status: ONGOING  4.  Pt will demonstrate improved vocal quality for 80% of unstructured conversations x2 given occasional min A  Baseline:  Goal status: MET  5.  Pt will report improvements on VCD PROM by 2 points by LTG date Baseline:  Goal status: ONGOING   ASSESSMENT:  CLINICAL IMPRESSION: Patient Dot is a 68 y.o. F who was seen today for ST tx for chronic cough since 2023. Hx of allergies and  GERD. Reports frequent coughing, particularly at night, with usual phlegm expectoration. Today, pt exhibited intermittent throat clears and coughing.  Moderate dysphonia present. Pt's voice characterized as hoarse, rough, and intermittently breathy. Pt reports hoarseness impacting social communication at family events and talking on telephone. Continued education and instruction of voice exercises and vocal hygiene protocol. Pt would benefit from skilled ST to address aforementioned deficits to improve QoL and enhance communication efficacy.   OBJECTIVE IMPAIRMENTS: include voice disorder and chronic cough. These impairments are limiting patient from effectively communicating at home and in community.Factors affecting potential to achieve goals and functional outcome are previous level of function. Patient will benefit from skilled SLP services to address above impairments and improve overall function.  REHAB POTENTIAL: Good  PLAN:  SLP FREQUENCY: 1-2x/week  SLP DURATION: 10 weeks  PLANNED INTERVENTIONS: Environmental controls, Cueing hierachy, Functional tasks, Multimodal communication approach, SLP instruction and feedback, Compensatory strategies, Patient/family education, [phone] Treatment of speech (30 or 45 min) , [id number] Treatment of swallowing function, and [id number]- Speech Eval Behavioral Qualitative Voice Resonance    Mathis Leita Caldron, CCC-SLP 12/12/2023, 3:19 PM

## 2023-12-13 ENCOUNTER — Ambulatory Visit (INDEPENDENT_AMBULATORY_CARE_PROVIDER_SITE_OTHER)

## 2023-12-13 DIAGNOSIS — J309 Allergic rhinitis, unspecified: Secondary | ICD-10-CM | POA: Diagnosis not present

## 2023-12-13 NOTE — Progress Notes (Signed)
 Immunotherapy   Patient Details  Name: Brianna Santana MRN: [id number] Date of Birth: 03/21/1956  12/13/2023  Brianna Santana started injections for  [place] Following schedule: B  Frequency: 1-2x weekly Epi-Pen:Epi-Pen Available  Consent signed and patient instructions given. Patient waited in office 30 minutes with no issues.   Isaiah LITTIE Shed 12/13/2023, 4:01 PM

## 2023-12-15 ENCOUNTER — Ambulatory Visit (HOSPITAL_COMMUNITY)
Admission: RE | Admit: 2023-12-15 | Discharge: 2023-12-15 | Disposition: A | Discharge status: Home / Self Care | Source: Ambulatory Visit | Attending: Otolaryngology

## 2023-12-15 ENCOUNTER — Ambulatory Visit (HOSPITAL_COMMUNITY)
Admission: RE | Admit: 2023-12-15 | Discharge: 2023-12-15 | Disposition: A | Discharge status: Home / Self Care | Source: Ambulatory Visit | Attending: *Deleted | Admitting: *Deleted

## 2023-12-15 ENCOUNTER — Ambulatory Visit (HOSPITAL_COMMUNITY)
Admission: RE | Admit: 2023-12-15 | Discharge: 2023-12-15 | Disposition: A | Discharge status: Home / Self Care | Source: Ambulatory Visit | Attending: Otolaryngology | Admitting: Otolaryngology

## 2023-12-15 ENCOUNTER — Encounter: Admitting: Speech Pathology

## 2023-12-15 ENCOUNTER — Other Ambulatory Visit (INDEPENDENT_AMBULATORY_CARE_PROVIDER_SITE_OTHER): Payer: Self-pay | Admitting: Otolaryngology

## 2023-12-15 DIAGNOSIS — R059 Cough, unspecified: Secondary | ICD-10-CM

## 2023-12-15 DIAGNOSIS — K219 Gastro-esophageal reflux disease without esophagitis: Secondary | ICD-10-CM | POA: Insufficient documentation

## 2023-12-15 DIAGNOSIS — R1310 Dysphagia, unspecified: Secondary | ICD-10-CM

## 2023-12-15 DIAGNOSIS — J383 Other diseases of vocal cords: Secondary | ICD-10-CM

## 2023-12-15 DIAGNOSIS — R0982 Postnasal drip: Secondary | ICD-10-CM | POA: Insufficient documentation

## 2023-12-15 DIAGNOSIS — J343 Hypertrophy of nasal turbinates: Secondary | ICD-10-CM

## 2023-12-15 DIAGNOSIS — R490 Dysphonia: Secondary | ICD-10-CM | POA: Diagnosis not present

## 2023-12-15 DIAGNOSIS — R053 Chronic cough: Secondary | ICD-10-CM | POA: Diagnosis not present

## 2023-12-15 DIAGNOSIS — J3089 Other allergic rhinitis: Secondary | ICD-10-CM

## 2023-12-15 DIAGNOSIS — R09A2 Foreign body sensation, throat: Secondary | ICD-10-CM | POA: Diagnosis not present

## 2023-12-15 DIAGNOSIS — J342 Deviated nasal septum: Secondary | ICD-10-CM

## 2023-12-15 NOTE — Evaluation (Signed)
 Modified Barium Swallow Study  Patient Details  Name: Brianna Santana MRN: [id number] Date of Birth: 19-Jul-1955  Today's Date: 12/15/2023  Modified Barium Swallow completed.  Full report located under Chart Review in the Imaging Section.  History of Present Illness Pt is a 68 YO F referred for evaluation of swallow function and safety in the setting of chronic cough. Currently followed by OPST. Pt reports cough began in 2023. Also reports increased post nasal drip and expectoration of phlegm. Comorbid dysphonia. PMH significant for GERD, taking PPI to address and has started alginate supplementation.   Clinical Impression Brianna Santana presents with functional and safe oropharyngeal swallow. Oral phase was c/b adequate lingual strength and coordination for directed bolus hold and a/p transit of boluses. Pharyngeal phase was c/b intact hyolaryngeal excursion, epiglottic inversion, and laryngeal vestibule closure which facilitated complete airway protection with no instances of penetration or aspiration. Trace pharyngeal residue noted, clears with spontaneous secondary swallow. Reports of globus reported during assessment with esophageal sweep demonstrating statis in esophagus at this time with retrograde flow. Pt scheduled for esophagram following. Pending results, pt may benefit from dedicated work up via GI. Pt is suggested for ongoing regular solids and thin liquids diet, general aspiration precautions. Recommend esophageal precautions of upright for all intake, small bites with liquid wash, dietary modification, continue with reflux management regimen of PPI and alginate supplement.  Factors that may increase risk of adverse event in presence of aspiration Brianna Santana & Brianna Santana 2021): Respiratory or GI disease  Swallow Evaluation Recommendations Recommendations: PO diet PO Diet Recommendation: Regular;Thin liquids (Level 0) Swallowing strategies  :  (no specific strategies needed) Postural  changes: Position pt fully upright for meals;Stay upright 30-60 min after meals Oral care recommendations: Oral care BID (2x/day) Recommended consults: Consider GI consultation   Brianna Santana Brianna Santana Brianna Santana 12/15/2023,12:14 PM

## 2023-12-19 ENCOUNTER — Encounter: Payer: Self-pay | Admitting: Primary Care

## 2023-12-19 ENCOUNTER — Encounter: Payer: Self-pay | Admitting: Speech Pathology

## 2023-12-19 ENCOUNTER — Ambulatory Visit (INDEPENDENT_AMBULATORY_CARE_PROVIDER_SITE_OTHER): Payer: Self-pay

## 2023-12-19 ENCOUNTER — Ambulatory Visit: Admitting: Primary Care

## 2023-12-19 ENCOUNTER — Ambulatory Visit: Admitting: Speech Pathology

## 2023-12-19 ENCOUNTER — Ambulatory Visit (INDEPENDENT_AMBULATORY_CARE_PROVIDER_SITE_OTHER)

## 2023-12-19 VITALS — BP 126/70 | HR 86 | Temp 97.2°F | Ht 64.0 in | Wt 176.6 lb

## 2023-12-19 DIAGNOSIS — R053 Chronic cough: Secondary | ICD-10-CM

## 2023-12-19 DIAGNOSIS — Z8719 Personal history of other diseases of the digestive system: Secondary | ICD-10-CM

## 2023-12-19 DIAGNOSIS — R498 Other voice and resonance disorders: Secondary | ICD-10-CM | POA: Diagnosis not present

## 2023-12-19 DIAGNOSIS — U099 Post covid-19 condition, unspecified: Secondary | ICD-10-CM

## 2023-12-19 DIAGNOSIS — J309 Allergic rhinitis, unspecified: Secondary | ICD-10-CM

## 2023-12-19 DIAGNOSIS — R918 Other nonspecific abnormal finding of lung field: Secondary | ICD-10-CM | POA: Diagnosis not present

## 2023-12-19 MED ORDER — GABAPENTIN 300 MG PO CAPS: 300.0000 mg | ORAL_CAPSULE | Freq: Every day | ORAL | 3 refills | Status: DC

## 2023-12-19 NOTE — Progress Notes (Signed)
 @Patient  ID: Brianna Santana, female    DOB: 02/06/1956, 68 y.o.   MRN: [id number]  Chief Complaint  Patient presents with   Asthma    Referring provider: Haze Kingfisher, MD  HPI: 68 year old female, former smoker. PMH significant for moderate persistent asthma, allergic rhinitis, post covid fibrosis, chronic cough, GERD, osteopenia, hyperthyroidism, crohn's disease, thyroid  mass.   Previous [facility] pulmonary encounter:  08/02/2023 -   Chief Complaint  Patient presents with   Pulmonary Consult    Self referral. Pt c/o cough since Covid 19 dx Sept 2023. Cough is prod with clear to light yellow sputum. She states she was dxed with Asthma years ago. She has DOE if she moves too quickly.    Brianna Santana 68 y.o. -new consult for chronic cough.  She tells me that at baseline she is has a history of seasonal allergies that gets me all the time and this has been on since her child.  But most pronounced is that in 01/23/22 she suffered from COVID-19 for which she took Paxlovid  treated as an outpatient.  Since then she has got severe chronic cough.  RSI cough score is below.  It is rated as moderate right now.  Since January she has been on Trelegy which has helped somewhat but she says she wakes up in the middle of the night cough is made worse by drinking milk or eating pork.  There is no clear-cut relieving factors.  There is chronic sinus drainage associated with it there is some wheezing associated with it there is also sputum of clear color and also occasionally yellow.  There are some mild associated shortness of breath.  Her exam nitric oxide  test today is 9 ppb..  This is normal.  She is on Trelegy.  There is no fever.  She says the cough wakes her up in the night.  Is also present in the daytime.  She does have acid reflux and is on Pepcid  and PPI She does not have hypertension - She does have chronic sinus drainage. She is not on ACE inhibitor's.   OV 10/07/2023  Subjective:   Patient ID: Brianna Santana, female , DOB: 12-05-1955 , age 23 y.o. , MRN: [id number] , ADDRESS: [id number] [place] [id number] PCP Haze Kingfisher, MD Patient Care Team: Haze Kingfisher, MD as PCP - General (Family Medicine)  This Provider for this visit: Treatment Team:  Attending Provider: Geronimo Amel, MD    10/07/2023 -   Chief Complaint  Patient presents with   Follow-up    F/u PFT and HRCT   Brianna Santana 68 y.o. -returns for follow-up of chronic cough.  Since her last visit she says she is 80% better.  This because of promethazine  cough syrup and primary care starting her on Spiriva .  Recently somebody started on prednisone  she is taken 1 dose but this made the cough worse so she wants to stop it.  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  She also has small multiple pulmonary nodules that require follow-up.  She did have a CT sinus results are pending.  She continues to have hoarse voice.  She says she wants to taper her promethazine  to off.  In the past she is taking gabapentin  and she is very interested in this.  Otherwise feeling well.  RAST allergy  panel is normal.  Pulmonary function test also normal.  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  Glad you are 80% better but still dealing with hoarse voice and also with significant clearing of the throat.  I noted that you do want to stop your promethazine  and try gabapentin  instead.   Tehre are small multiople lung nodules on CT April 2025   Plan - Take gabapentin  300mg  once daily x 7 days, then 300mg  twice daily x 7 days, then 300mg  three times daily to continue. If this makes you too sleepy or drowsy call us  and we will cut your medication dosing  down - Refer ENT -Referral voice rehabilitation -Await  CT sinus results - Continue Spiriva  as given by primary care - Slowly taper promethazine   - You can stop the prednisone  - For COVID fibrosis will just keep an eye with breathing test every 6-12 months   - repeat CT chest in 6 months (will order at [facility])     Follow-up - 8-12  weeks with nurse practitioner  -RSI cough score at follow-up    12/19/2023- Interim hx  Discussed the use of AI scribe software for clinical note transcription with the patient, who gave verbal consent to proceed.  History of Present Illness   [name] is a 68 year old female with post-COVID pulmonary fibrosis who presents for follow-up of chronic cough and related symptoms.  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 Her cough improved with Spiriva , and she was started on gabapentin , titrated from 300 mg once daily to 300 mg three times daily. She completed the gabapentin  course in June, which significantly helped her cough and sleep. Since stopping gabapentin , she has a manageable, mild nagging cough, especially in the morning, which she attributes to asthma.  She continues to use Spiriva  and reports using Flonase and Zyrtec  for nasal symptoms. She also takes Minolira at night. She experiences voice hoarseness, rated as a 3 out of 5, which improves throughout the day with exercises. She frequently clears her throat, rated as a 3 out of 5, and experiences postnasal drip and excessive throat mucus, also rated as a 3 out of 5.  She has intermittent difficulty swallowing solids and liquids, rated as 3.5 out of 5, and reports a sensation of a lump in her throat, rated as 3 out of 5. She underwent a modified barium swallow test, which recommended a regular diet with thin liquids and upright positioning after meals.  She experiences a cough that wakes her up early in the morning, rated as 3.5 to 4 out of 5, and rates her cough's annoyance level as 3.5 to 4 out of 5.  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 No recent breathing difficulties, rated as 1 out of 5, and no recent heartburn, but experiences gas after meals, rated as 3 out of 5.       Dr Felice Reflux Symptom Index (> 13-15 suggestive of LPR cough) 0 -> 5  =  none ->severe problem 08/02/2023 Fino 9 ppb. 10/07/2023 Reports she is 80% better after starting Spiriva  and also promethazine  cough syrup. 12/19/2023   Hoarseness of problem with voice  4 5 3   Clearing  Of Throat 4 5 3   Excess throat mucus or feeling of post nasal drip 3 5 3   Difficulty swallowing food, liquid or tablets 3 4 3.5  Cough after eating or lying down 3 5 3.5  Breathing difficulties or choking episodes 3 4 1   Troublesome or annoying cough 4 5 3.5  Sensation of something sticking in throat or lump in throat 3 5 3   Heartburn, chest pain, indigestion, or stomach acid coming up 3 5 3   TOTAL 30 43 26.5      Latest Reference Range & Units 08/02/23 15:15  Class Description Allergens  Comment  D Pteronyssinus IgE Class 0 kU/L <0.10  D Farinae IgE Class 0 kU/L <0.10  Cat Dander IgE Class 0 kU/L <0.10  Dog Dander IgE Class 0 kU/L <0.10  Penicillium Chrysogen IgE Class 0 kU/L <0.10  Cladosporium Herbarum IgE Class 0 kU/L <0.10  Aspergillus Fumigatus IgE Class 0 kU/L <0.10  Mucor Racemosus IgE Class 0 kU/L <0.10  Alternaria Alternata IgE Class 0 kU/L <0.10    CT sinus results are pending.   IMPRESSION: 1. Mild patchy ground-glass opacity and reticulation throughout both lungs with associated mild cylindrical bronchiectasis, most prominent in the lower lobes. Findings are compatible with a mild postinflammatory fibrosis pattern due to reported history of COVID-19  infection. 2. Several solid scattered pulmonary nodules in the right greater than left lungs, largest 0.7 cm in the medial right upper lobe. Non-contrast chest CT at 3-6 months is recommended. If the nodules are stable at time of repeat CT, then future CT at 18-24 months (from today's scan) is considered optional for low-risk patients, but is recommended for high-risk patients. This recommendation follows the consensus statement: Guidelines for Management of Incidental Pulmonary Nodules Detected on CT Images: From the Fleischner Society 2017; Radiology 2017; 284:228-243. 3. Right thyroid  4.2 cm heterogeneous hypodense nodule with associated left tracheal deviation and mild tracheal narrowing. Recommend thyroid  US . 4. Cholelithiasis. 5.  Aortic Atherosclerosis (ICD10-I70.0).     Electronically Signed   By: Selinda DELENA Blue M.D.   On: 09/02/2023 16:49    PFT     Latest Ref Rng & Units 10/07/2023    7:59 AM  PFT Results  FVC-Pre L 2.75  P  FVC-Predicted Pre % 89  P  FVC-Post L 2.71  P  FVC-Predicted Post % 88  P  Pre FEV1/FVC % % 77  P  Post FEV1/FCV % % 81  P  FEV1-Pre L 2.10  P  FEV1-Predicted Pre % 90  P  FEV1-Post L 2.21  P  DLCO uncorrected ml/min/mmHg 17.39  P  DLCO UNC% % 88  P  DLVA Predicted % 100  P  TLC L 5.11  P  TLC % Predicted % 101  P  RV % Predicted % 106  P     Allergies  Allergen Reactions   Seasonal Ic [Cholestatin]    Tramadol      Major Headaches   Amoxicillin  Itching and Rash   Levofloxacin Rash   Tramadol  Hcl Rash    Immunization History  Administered Date(s) Administered   Hepatitis B, PED/ADOLESCENT 04/11/2013, 05/10/2013, 11/12/2013   Influenza Split 04/12/2012   Influenza,inj,Quad PF,6+ Mos 05/02/2015, 04/05/2016, 02/18/2017, 02/26/2019   Influenza,inj,Quad PF,6-35 Mos 03/05/2020   PFIZER(Purple Top)SARS-COV-2 Vaccination 07/24/2019, 08/14/2019, 02/19/2020   PPD Test 03/30/2012, 11/16/2013, 12/03/2014   Pneumococcal Polysaccharide-23  03/05/2019   Pneumococcal-Unspecified 02/29/2012   Tdap 09/23/2020   Zoster, Live 09/25/2019, 03/05/2020  Past Medical History:  Diagnosis Date   Arthritis    Asthma    C. difficile colitis 2011 and 07/2010   Cervical dysplasia    Crohn disease (HCC)    Crohn's disease (HCC)    HSV infection    Hyperplastic polyp of intestine 09/2007   Osteopenia 11/2018   T score -2.0 FRAX 3.7% / 0.3%   Thyroid  disease    Urinary incontinence     Tobacco History: Social History   Tobacco Use  Smoking Status Former   Current packs/day: 0.00   Types: Cigarettes   Start date: 09/08/1978   Quit date: 09/07/1988   Years since quitting: 35.3  Smokeless Tobacco Never   Counseling given: Not Answered   Outpatient Medications Prior to Visit  Medication Sig Dispense Refill   adalimumab  (HUMIRA , 2 PEN,) 40 MG/0.4ML pen Inject 0.4 mLs (40 mg total) into the skin every 14 (fourteen) days. 6 each 3   albuterol  (VENTOLIN  HFA) 108 (90 Base) MCG/ACT inhaler INHALE 2 PUFFS INTO THE LUNGS EVERY 4 HOURS AS NEEDED FOR SHORTNESS OF BREATH OR WHEEZING (MAXIMUM 12 PUFFS TOTAL PER 24 HOURS) 54 g 1   azelastine  (ASTELIN ) 0.1 % nasal spray SPRAY TWO SPRAYS INTO EACH NOSTRIL TWICE A DAY AS NEEDED FOR RHINITIS 90 mL 1   CALCIUM-MAGNESIUM -VITAMIN D  PO Take 2 tablets by mouth daily. With Zinc     cetirizine  (ZYRTEC ) 10 MG tablet Take 1 tablet (10 mg total) by mouth 2 (two) times daily as needed for allergies (Can take an extra dose if needed during flare ups.). 180 tablet 1   dexlansoprazole  (DEXILANT ) 60 MG capsule Take 1 capsule (60 mg total) by mouth daily. 90 capsule 3   diphenhydrAMINE  (BENADRYL ) 25 MG tablet Take 1 tablet (25 mg total) by mouth every 6 (six) hours as needed. 20 tablet 0   famotidine  (PEPCID ) 40 MG tablet Take 1 tablet (40 mg total) by mouth 2 (two) times daily. 180 tablet 3   fluticasone  (FLONASE) 50 MCG/ACT nasal spray Place 2 sprays into both nostrils daily.     fluticasone -salmeterol  (WIXELA INHUB) 250-50 MCG/ACT AEPB Inhale 1 puff into the lungs in the morning and at bedtime. 180 each 1   montelukast  (SINGULAIR ) 10 MG tablet Take 1 tablet (10 mg total) by mouth at bedtime. 90 tablet 1   Tiotropium Bromide Monohydrate  (SPIRIVA  RESPIMAT) 2.5 MCG/ACT AERS Inhale 2 puffs into the lungs 1 day or 1 dose. 3 each 1   UNABLE TO FIND Med Name: reflux gourmet liquid pack     No facility-administered medications prior to visit.      Review of Systems  Review of Systems  Constitutional: Negative.   HENT:  Positive for trouble swallowing and voice change.   Respiratory:  Positive for cough.      Physical Exam  There were no vitals taken for this visit. Physical Exam Constitutional:      Appearance: Normal appearance.  HENT:     Head: Normocephalic and atraumatic.  Cardiovascular:     Rate and Rhythm: Normal rate and regular rhythm.  Pulmonary:     Effort: Pulmonary effort is normal.     Breath sounds: Normal breath sounds.  Musculoskeletal:        General: Normal range of motion.  Skin:    General: Skin is warm and dry.  Neurological:     General: No focal deficit present.     Mental Status: She is alert and oriented to person, place, and time.  Mental status is at baseline.  Psychiatric:        Mood and Affect: Mood normal.        Behavior: Behavior normal.        Thought Content: Thought content normal.        Judgment: Judgment normal.      Lab Results:  CBC    Component Value Date/Time   WBC 3.4 (L) 08/02/2023 1515   RBC 4.33 08/02/2023 1515   HGB 13.3 08/02/2023 1515   HGB 12.5 12/16/2022 1708   HCT 39.8 08/02/2023 1515   HCT 37.0 12/16/2022 1708   PLT 303.0 08/02/2023 1515   PLT 294 12/16/2022 1708   MCV 91.9 08/02/2023 1515   MCV 90 12/16/2022 1708   MCH 30.4 12/16/2022 1708   MCH 30.5 09/19/2022 1534   MCHC 33.4 08/02/2023 1515   RDW 13.7 08/02/2023 1515   RDW 12.8 12/16/2022 1708   LYMPHSABS 1.3 08/02/2023 1515   LYMPHSABS 2.3  12/16/2022 1708   MONOABS 0.4 08/02/2023 1515   EOSABS 0.4 08/02/2023 1515   EOSABS 0.3 12/16/2022 1708   BASOSABS 0.1 08/02/2023 1515   BASOSABS 0.1 12/16/2022 1708    BMET    Component Value Date/Time   NA 139 08/12/2023 1115   NA 139 02/20/2022 1154   K 4.1 08/12/2023 1115   CL 104 08/12/2023 1115   CO2 27 08/12/2023 1115   GLUCOSE 93 08/12/2023 1115   BUN 11 08/12/2023 1115   BUN 12 02/20/2022 1154   CREATININE 0.67 08/12/2023 1115   CALCIUM 9.4 08/12/2023 1115   GFRNONAA >60 09/19/2022 1534   GFRAA >90 04/04/2012 0540    BNP No results found for: BNP  ProBNP No results found for: PROBNP  Imaging: DG SWALLOW FUNC OP MEDICARE SPEECH PATH Result Date: 12/17/2023 Table formatting from the original result was not included. Modified Barium Swallow Study Patient Details Name: Cortasia Screws MRN: [id number] Date of Birth: [birth year] Today's Date: 12/17/2023 HPI/PMH: HPI: Pt is a 68 YO F referred for evaluation of swallow function and safety in the setting of chronic cough. Currently followed by [facility]. Pt reports cough began in 2023. Also reports increased post nasal drip and expectoration of phlegm. Comorbid dysphonia. PMH significant for GERD, taking PPI to address and has started alginate supplementation. Clinical Impression: Clinical Impression: Ms. Friscia presents with functional and safe oropharyngeal swallow. Oral phase was c/b adequate lingual strength and coordination for directed bolus hold and a/p transit of boluses. Pharyngeal phase was c/b intact hyolaryngeal excursion, epiglottic inversion, and laryngeal vestibule closure which facilitated complete airway protection with no instances of penetration or aspiration. Trace pharyngeal residue noted, clears with spontaneous secondary swallow. Reports of globus reported during assessment with esophageal sweep demonstrating statis in esophagus at this time with retrograde flow. Pt scheduled for esophagram following. Pending  results, pt may benefit from dedicated work up via GI. Pt is suggested for ongoing regular solids and thin liquids diet, general aspiration precautions. Recommend esophageal precautions of upright for all intake, small bites with liquid wash, dietary modification, continue with reflux management regimen of PPI and alginate supplement. Factors that may increase risk of adverse event in presence of aspiration Noe & Lianne 2021): Factors that may increase risk of adverse event in presence of aspiration Noe & Lianne 2021): Respiratory or GI disease Recommendations/Plan: Swallowing Evaluation Recommendations Swallowing Evaluation Recommendations Recommendations: PO diet PO Diet Recommendation: Regular; Thin liquids (Level 0) Swallowing strategies  : -- (no specific strategies needed) Postural changes:  Position pt fully upright for meals; Stay upright 30-60 min after meals Oral care recommendations: Oral care BID (2x/day) Recommended consults: Consider GI consultation Treatment Plan Treatment Plan Treatment recommendations: Defer treatment plan to SLP at other venue (see follow-up recommendations) Follow-up recommendations: Outpatient SLP Recommendations Recommendations for follow up therapy are one component of a multi-disciplinary discharge planning process, led by the attending physician.  Recommendations may be updated based on patient status, additional functional criteria and insurance authorization. Assessment: Orofacial Exam: Orofacial Exam Oral Cavity: Oral Hygiene: WFL Oral Cavity - Dentition: Dentures, top Orofacial Anatomy: WFL Oral Motor/Sensory Function: WFL Anatomy: Anatomy: WFL Boluses Administered: Boluses Administered Boluses Administered: Thin liquids (Level 0); Mildly thick liquids (Level 2, nectar thick); Moderately thick liquids (Level 3, honey thick); Puree; Solid  Oral Impairment Domain: Oral Impairment Domain Lip Closure: No labial escape Tongue control during bolus hold: Cohesive bolus  between tongue to palatal seal Bolus preparation/mastication: Slow prolonged chewing/mashing with complete recollection Bolus transport/lingual motion: Brisk tongue motion Oral residue: Residue collection on oral structures Location of oral residue : Tongue Initiation of pharyngeal swallow : Posterior laryngeal surface of the epiglottis  Pharyngeal Impairment Domain: Pharyngeal Impairment Domain Soft palate elevation: No bolus between soft palate (SP)/pharyngeal wall (PW) Laryngeal elevation: Complete superior movement of thyroid  cartilage with complete approximation of arytenoids to epiglottic petiole Anterior hyoid excursion: Complete anterior movement Epiglottic movement: Complete inversion Laryngeal vestibule closure: Complete, no air/contrast in laryngeal vestibule Pharyngeal stripping wave : Present - complete Pharyngeal contraction (A/P view only): N/A Pharyngoesophageal segment opening: Complete distension and complete duration, no obstruction of flow Tongue base retraction: No contrast between tongue base and posterior pharyngeal wall (PPW) Pharyngeal residue: Trace residue within or on pharyngeal structures Location of pharyngeal residue: Valleculae; Tongue base  Esophageal Impairment Domain: Esophageal Impairment Domain Esophageal clearance upright position: Esophageal retention with retrograde flow below pharyngoesophageal segment (PES) Pill: Pill Consistency administered: -- (did not test, scheduled for esophagram following) Penetration/Aspiration Scale Score: Penetration/Aspiration Scale Score 1.  Material does not enter airway: Thin liquids (Level 0); Mildly thick liquids (Level 2, nectar thick); Moderately thick liquids (Level 3, honey thick); Puree; Solid Compensatory Strategies: Compensatory Strategies Compensatory strategies: No   General Information: Caregiver present: No  Diet Prior to this Study: Regular; Thin liquids (Level 0)   Temperature : Normal   Respiratory Status: WFL   Supplemental O2:  None (Room air)   History of Recent Intubation: No  Behavior/Cognition: Alert; Cooperative Self-Feeding Abilities: Able to self-feed Baseline vocal quality/speech: Dysphonic Volitional Cough: Able to elicit Volitional Swallow: Able to elicit Exam Limitations: No limitations Goal Planning: Prognosis for improved oropharyngeal function: Good No data recorded No data recorded Patient/Family Stated Goal: decreased phlegm, coughing Consulted and agree with results and recommendations: Patient Pain: Pain Assessment Pain Assessment: No/denies pain End of Session: Start Time:SLP Start Time (ACUTE ONLY): 1127 Stop Time: SLP Stop Time (ACUTE ONLY): 1138 Time Calculation:SLP Time Calculation (min) (ACUTE ONLY): 11 min Charges: No data recorded SLP visit diagnosis: SLP Visit Diagnosis: Dysphagia, unspecified (R13.10) Past Medical History: Past Medical History: Diagnosis Date  Arthritis   Asthma   C. difficile colitis 2011 and 07/2010  Cervical dysplasia   Crohn disease (HCC)   Crohn's disease (HCC)   HSV infection   Hyperplastic polyp of intestine 09/2007  Osteopenia 11/2018  T score -2.0 FRAX 3.7% / 0.3%  Thyroid  disease   Urinary incontinence  Past Surgical History: Past Surgical History: Procedure Laterality Date  BREAST CYST ASPIRATION Right   2000  BREAST EXCISIONAL BIOPSY Right   1980's  COLONOSCOPY  09/18/2013  COLPOSCOPY    FLEXIBLE SIGMOIDOSCOPY  03/29/2012  Procedure: FLEXIBLE SIGMOIDOSCOPY;  Surgeon: Gwendlyn ONEIDA Buddy, MD,FACG;  Location: [facility];  Service: Endoscopy;  Laterality: N/A;  GANGLION CYST EXCISION Left   POLYPECTOMY    TONSILLECTOMY    TOTAL ABDOMINAL HYSTERECTOMY  2000  TRIGGER FINGER RELEASE    WRIST SURGERY Left 2021 Harlene LITTIE Ned 12/17/2023, 3:39 PM CLINICAL DATA:  85y ear old female with chronic cough, globus sensation. Modified barium swallow requested. EXAM: MODIFIED BARIUM SWALLOW TECHNIQUE: Different consistencies of barium were administered orally to the patient by the Speech Pathologist.  Imaging of the pharynx was performed in the lateral projection. Kacie Matthews PA-C was present in the fluoroscopy room during this study, which was supervised and interpreted by Dr. Ree Molt. Radiologist, not in attendance for the exam. FLUOROSCOPY: Radiation Exposure Index (as provided by the fluoroscopic device): 26.98 mGy Kerma COMPARISON:  None Available. FINDINGS: Vestibular  Penetration:  None seen. Aspiration:  None seen. Other:  None. IMPRESSION: Please refer to the Speech Pathologists report for complete details and recommendations. Electronically Signed   By: Ree Molt M.D.   On: 12/15/2023 12:21  DG ESOPHAGUS W DOUBLE CM (HD) Result Date: 12/15/2023 CLINICAL DATA:  68 year old female with chronic cough, globus sensation. Barium swallow requested. EXAM: ESOPHAGUS/BARIUM SWALLOW/TABLET STUDY TECHNIQUE: Combined double and single contrast examination was performed using effervescent crystals, high-density barium, and thin liquid barium. This exam was performed by Kacie Matthews PA-C and was supervised and interpreted by Ree Molt, MD. FLUOROSCOPY: Radiation Exposure Index (as provided by the fluoroscopic device): 47.6 mGy Kerma COMPARISON:  None Available. FINDINGS: Swallowing: Appears normal. No vestibular penetration or aspiration seen. Pharynx: Mild cricopharyngeal hypertrophy Esophagus: Normal appearance. Esophageal motility: Poor primary stripping wave with retropulsion of barium bolus to the mid and upper esophagus. Tertiary contractions present. Hiatal Hernia: None. Gastroesophageal reflux: Spontaneous gastroesophageal reflux to the lower esophagus. Ingested 13mm barium tablet: Paused briefly at the gastroesophageal junction but ultimately passed without issue. Other: Cervical osteophytes present. IMPRESSION: 1. Poor primary peristaltic wave with retropulsion of barium bolus to the mid and upper esophagus. 2.  Tertiary contractions present. 3. Small volume spontaneous  gastroesophageal reflux to the lower esophagus. Electronically Signed   By: Ree Molt M.D.   On: 12/15/2023 12:23   MM 3D SCREENING MAMMOGRAM BILATERAL BREAST Result Date: 12/13/2023 CLINICAL DATA:  Screening. EXAM: DIGITAL SCREENING BILATERAL MAMMOGRAM WITH TOMOSYNTHESIS AND CAD TECHNIQUE: Bilateral screening digital craniocaudal and mediolateral oblique mammograms were obtained. Bilateral screening digital breast tomosynthesis was performed. The images were evaluated with computer-aided detection. COMPARISON:  Previous exam(s). ACR Breast Density Category c: The breasts are heterogeneously dense, which may obscure small masses. FINDINGS: There are no findings suspicious for malignancy. IMPRESSION: No mammographic evidence of malignancy. A result letter of this screening mammogram will be mailed directly to the patient. RECOMMENDATION: Screening mammogram in one year. (Code:SM-B-01Y) BI-RADS CATEGORY  1: Negative. Electronically Signed   By: Dirk Arrant M.D.   On: 12/13/2023 10:34     Assessment & Plan:   No problem-specific Assessment & Plan notes found for this encounter.  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  - Order CT scan in September to monitor fibrosis and pulmonary nodules  Chronic cough with neuropathy Chronic cough has improved significantly with gabapentin , which was titrated up to 300 mg three times a day.  The cough has not returned to its previous severity after discontinuation of gabapentin . The cough is manageable at present, with occasional morning episodes likely related to asthma. Cough score improved 26.5 (43).  - Offer gabapentin  300 mg at bedtime if needed for cough management  Asthma Asthma symptoms persist despite current treatment. Occasional morning cough attributed to asthma or allergies. No recent breathing difficulties reported. 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 Voice therapy is ongoing, and exercises are being performed regularly. Voice hoarseness persists but is improving with therapy. - Continue voice therapy and exercises  Dysphagia Intermittent difficulty swallowing solids and liquids, with a recent modified barium swallow study showing functional and safe oropharyngeal swallow. Persistent symptoms warrant consideration for GI consultation and potential esophageal dilation. - Refer to GI for evaluation and potential esophageal dilation  Gastroesophageal reflux disease (GERD) GERD managed with Dexilant , famotidine , and lifestyle modifications. Symptoms include occasional heartburn and gas relief after meals. Persistent symptoms warrant consideration for GI consultation. - Continue Dexilant  and famotidine  - Maintain lifestyle and dietary changes - Refer to GI for further evaluation  Recording duration: 17 minutes      Almarie LELON Ferrari, NP 12/19/2023

## 2023-12-19 NOTE — Telephone Encounter (Signed)
 Spoke to patient about scan. Patient understood. Also gave patient the date of her next visit.

## 2023-12-19 NOTE — Patient Instructions (Addendum)
  VISIT SUMMARY: During your visit, we discussed your ongoing symptoms related to post-COVID pulmonary fibrosis, chronic cough, asthma, age-related vocal fold atrophy, difficulty swallowing (dysphagia), and gastroesophageal reflux disease (GERD). We reviewed your current treatments and made plans for further management and follow-up.  YOUR PLAN: -POST-COVID PULMONARY FIBROSIS: Post-COVID pulmonary fibrosis is a condition where the lungs have scarring following a COVID-19 infection. We will monitor your condition with a CT scan in September to check for any changes in the fibrosis and pulmonary nodules.  -CHRONIC COUGH WITH NEUROPATHY: Chronic cough with neuropathy means your cough is related to nerve irritation. Your cough has improved with gabapentin , and you can take 300 mg at bedtime if needed to manage your cough.  -ASTHMA: Asthma is a condition where your airways become inflamed and narrow, causing breathing difficulties. Continue using your Spiriva  inhaler and taking Flonase and Zyrtec  for allergy  management.  -AGE-RELATED VOCAL FOLD ATROPHY: Age-related vocal fold atrophy is the thinning of the vocal cords due to aging. Continue with your voice therapy and exercises to help improve your voice hoarseness.  -DYSPHAGIA: Dysphagia is difficulty swallowing. We will refer you to a gastrointestinal (GI) specialist for further evaluation and potential esophageal dilation to help with your swallowing difficulties.  -GASTROESOPHAGEAL REFLUX DISEASE (GERD): GERD is a condition where stomach acid frequently flows back into the tube connecting your mouth and stomach, causing heartburn. Continue taking Dexilant  and famotidine , maintain your lifestyle and dietary changes, and we will refer you to a GI specialist for further evaluation.  INSTRUCTIONS: Please schedule a CT scan in September to monitor your post-COVID pulmonary fibrosis. Additionally, follow up with a GI specialist for further evaluation of  your dysphagia and GERD. Continue with your current medications and voice therapy exercises as discussed.  Follow-up 3-4 months with Dr. Geronimo - 15 min slot ok

## 2023-12-19 NOTE — Therapy (Signed)
 OUTPATIENT SPEECH LANGUAGE PATHOLOGY VOICE TREATMENT   Patient Name: Brianna Santana MRN: [id number] DOB:[birth year], 68 y.o., female Today's Date: 12/19/2023  PCP: Haze Kingfisher MD REFERRING PROVIDER: Geronimo Amel, MD  END OF SESSION:  End of Session - 12/19/23 1218     Visit Number 8    Number of Visits 13    Date for SLP Re-Evaluation 01/18/24    Authorization Type [employer]    SLP Start Time 1230    SLP Stop Time  1315    SLP Time Calculation (min) 45 min    Activity Tolerance Patient tolerated treatment well            Past Medical History:  Diagnosis Date   Arthritis    Asthma    C. difficile colitis 12/30/09 and 07/2010   Cervical dysplasia    Crohn disease (HCC)    Crohn's disease (HCC)    HSV infection    Hyperplastic polyp of intestine 09/2007   Osteopenia 31-Dec-2018   T score -2.0 FRAX 3.7% / 0.3%   Thyroid  disease    Urinary incontinence    Past Surgical History:  Procedure Laterality Date   BREAST CYST ASPIRATION Right    2000   BREAST EXCISIONAL BIOPSY Right    1980's   COLONOSCOPY  09/18/2013   COLPOSCOPY     FLEXIBLE SIGMOIDOSCOPY  03/29/2012   Procedure: FLEXIBLE SIGMOIDOSCOPY;  Surgeon: Gwendlyn ONEIDA Buddy, MD,FACG;  Location: [facility];  Service: Endoscopy;  Laterality: N/A;   GANGLION CYST EXCISION Left    POLYPECTOMY     TONSILLECTOMY     TOTAL ABDOMINAL HYSTERECTOMY  2000   TRIGGER FINGER RELEASE     WRIST SURGERY Left 12-31-19   Patient Active Problem List   Diagnosis Date Noted   COVID-19 virus infection 02/24/2022   Moderate persistent asthma with (acute) exacerbation 02/24/2022   Trigger thumb, left thumb 03/26/2020   Stenosing tenosynovitis of wrist 03/26/2020   Perennial allergic rhinitis 12/20/2016   Cough 05/10/2016   Acute left-sided thoracic back pain 04/05/2016   Routine general medical examination at a health care facility 05/02/2015   Thyroid  mass 01/16/2015   Drug allergy  07/30/2014   Crohn's disease  (HCC) 03/18/2014   Osteopenia 09/11/2013   Hyperthyroidism    GERD 11/02/2007   Moderate persistent asthma without complication 10/31/2007    Onset date: 10/07/2023 (referral date)  REFERRING DIAG: R05.3,U09.9 (ICD-10-CM) - COVID-19 long hauler manifesting chronic cough R05.3 (ICD-10-CM) - Chronic cough R49.0 (ICD-10-CM) - Hoarse voice quality J38.7 (ICD-10-CM) - Irritable larynx syndrome J32.9 (ICD-10-CM) - Chronic sinusitis, unspecified location R91.8 (ICD-10-CM) - Multiple lung nodules on CT  THERAPY DIAG: Other voice and resonance disorders  Rationale for Evaluation and Treatment: Rehabilitation  SUBJECTIVE:   SUBJECTIVE STATEMENT: He says I sound better re: voice  Pt accompanied by: self  PERTINENT HISTORY: [name] 68 y.o. -new consult for chronic cough.  She tells me that at baseline she is has a history of seasonal allergies that gets me all the time and this has been on since her child.  But most pronounced is that in 12/30/2021 she suffered from COVID-19 for which she took Paxlovid  treated as an outpatient.  Since then she has got severe chronic cough.  RSI cough score is below.  It is rated as moderate right now.  Since January she has been on Trelegy which has helped somewhat but she says she wakes up in the middle of the night cough is made worse by  drinking milk or eating pork.  There is no clear-cut relieving factors.  There is chronic sinus drainage associated with it there is some wheezing associated with it there is also sputum of clear color and also occasionally yellow.  There are some mild associated shortness of breath.  Her exam nitric oxide  test today is 9 ppb..  This is normal.  She is on Trelegy.  There is no fever.  She says the cough wakes her up in the night.  Is also present in the daytime.   She does have acid reflux and is on Pepcid  and PPI She does not have hypertension - She does have chronic sinus drainage. She is not on ACE  inhibitor's.  PAIN: Are you having pain? No  FALLS: Has patient fallen in last 6 months? No  LIVING ENVIRONMENT: Lives with: lives with their family Lives in: House/apartment  PLOF:Level of assistance: Independent with ADLs, Independent with IADLs Employment: Retired  PATIENT GOALS: get voice cleared back up. Stop all this coughing   OBJECTIVE:  Note: Objective measures were completed at Evaluation unless otherwise noted.  COGNITION: Overall cognitive status: Within functional limits for tasks assessed  SOCIAL HISTORY: Occupation: Retired Facilities manager) Water intake: optimal Caffeine/alcohol intake: minimal  Daily voice use: moderate (limited d/t negative feedback)  PHYSICAL SYMPTOMS: Do you have any burning, soreness, tickling, or irritation in your throat? Yes  If yes, can you describe the sensation? Tickle Do you sometimes have a sensation of a lump in your throat?  Yes - intermittent  Do you have any aching or tightness in your throat? No Do you ever feel tension in your neck area? No - noted enlarged neck area  Does your voice get tired easily? Unsure, has not noted  Do you feel as if you have to strain to produce voice? Yes Do you feel as if you need to cough or clear your throat a lot? Yes - a lot  Do you ever lose your voice completely? Yes - intermittent  Do you ever have difficulty swallowing? Yes - sometimes feels like gets choked with solids, possibly liquids  Do you have difficulty projecting your voice? Yes   PERCEPTUAL VOICE ASSESSMENT: Voice quality: hoarse, harsh, rough, strained, low vocal intensity, and vocal fatigue Vocal abuse: habitual throat clearing and chronic cough  Resonance: hyponasal Respiratory function: thoracic breathing and clavicular breathing  OBJECTIVE VOICE ASSESSMENT: Maximum phonation time for sustained ah: 14 seconds, pitch breaks - resulted in coughing  Conversational loudness average: 70 dB Conversational loudness range:  68-72 dB S/z ratio: 1.18 (Suggestive of dysfunction >1.0); questionable diplophonia? Breathy quality  PATIENT REPORTED OUTCOME MEASURES (PROM): Vocal Cord Dysfunction Questionnaire (VCD- Q): 54                                                                                                          TREATMENT DATE:   12/19/23: Enters with clear voice - she saw pulmonology today and was unable to achieve a clear voice with the pulmonologist. She said I just couldn't get my voice out, I  don't know why. HEP with rare min A and clear voice consistency. In conversation focusing on carryover of increased vocal intensity and clear phonation with usual mod A - intermittent dysphonia persists.   12/12/23: Enters with clear voice - HEP for high intensity voice exercises (PhoRTE with usual mod modeling) In structured conversation exercises, Gelisa maintained 72 +dB with mod I. She uses throat clear alternatives with mod I - reports cough is over 50% improved and has started alginate - required correction to take alginate after meals as she was taking it before breakfast. In moderately complex conversation re: current events she maintained clear voice and maintained WNL volume.   12/07/23: Enters with mildly hoarse voice - she completes warm up with flow phonation phrases with clear phonation and rare min A 20/20 phrases. Targeted carryover of strategies to maintain clear phonation in conversation. ENT dx with vocal fold atrophy - trial of high intensity voice exercises for vocal fold atrophy - 10 second sustained ah! With clear phonation average 84dB - rare min A - pitch glides with high intensity voice with clear phonation rare min A  12/05/23: Reviewed ENT consult - she has not yet ordered alginate - she plans to order this today. Resonant and flow exercises achieved clear phonation with rare min A. Targeted carryover of strategies to maintain clear phonation reading and answering conversation questions - she  required usual min verbal cues, gesture cues to correct hoarse rough voice. I had her spouse come in half way through to observe cueing and that Unice can achieve clear phonation when she is mindful to carryover her resonant and flow phonation. Throat clear 2x, she required verbal cues to ID throat clearing - after throat clear, she independently used swallows and sniff-blows to avoid subsequent throat clears. She has stopped Gabapentin  and continues to report reduced coughing, mostly coughing in am and when she encounters smells - (smoke at a cookout- she reports she moved away from the smell and used relaxed sniff-blow to limit cough. MBSS scheduled 7/17.  11/29/23: Reported consistent completion of HEP. Conducted warm-up with SOVTE and flow phonation, with rare min A to complete. Introduced resonant exercises today due to evident back focused phonation, with handout provided. HEP updated with resonant exercises recommended up to hum + words. Cough suppression techniques intermittently utilized x3 with rare cues. Intermittent throat clears evident following initial and subsequent sips of water. Usual fading to occasional cues required to swallow hard. Pt c/o persistent phlegm impacting voice and swallow function. Discussed upcoming ENT visit.   11/16/23: Endorses completing HEP - today she demonstrated SOVTE in water with rare min A. Gabapentin  has reduced coughing, however Labrenda demonstrates abdominal sniff blow with rare min A today - verbal cues. She reports using this to stop coughing at night. She continues to eliminate throat clearing behavior - no throat clearing today. Voice hoarse - targeted flow phonation with tissue for feedback, achieving clear phonation at syllable, word and phrase level  flow phonation phrases with occasional min verbal cues to ID and correct hoarse, pressed voice. In conversation, Earlee required usual mod verbal cues to ID and self correct voice and verbal cues to ID when she  is speaking on residual air.   11/14/23: Endorsed some carryover of SLP recommendations for throat clear alternatives and cough suppression techniques initiated at eval. Reviewed targeted techniques with no throat clearing and rare cough x1 evidenced today. Introduced SOVTE and flow phonation techniques to address pressed speech and hoarseness. Usual min-mod A provided  to optimize breath support/reduce breath hold, relax neck and upper body, and increase vocal intensity. Updated HEP with SOVTE and flow phonation but suspect additional education and training next session would be beneficial due to evident tension.   11/09/23: ST evaluation and POC complete. Initiated education and instruction of throat clear alternatives and cough suppression techniques. Modeling and handout provided. Additional education warranted.    PATIENT EDUCATION: Education details: see above Person educated: Patient Education method: Explanation, Demonstration, and Verbal cues Education comprehension: verbalized understanding, returned demonstration, and needs further education  GOALS: Goals reviewed with patient? Yes  SHORT TERM GOALS: Target date: 12/07/2023  Pt will demonstrate throat clear alternative for 50% of opportunities in therapy given occasional min A Baseline: Goal status: MET  2.  Pt will demonstrate cough suppression technique for 50% of opportunities in therapy given occasional min A  Baseline:  Goal status: MET  3.  Pt will accurately demonstrate recommended vocal exercises given occasional min A (Pending ENT exam) Baseline:  Goal status: MET  LONG TERM GOALS: Target date: 01/18/2024  Pt will complete daily HEP for 5/7 days Baseline:  Goal status: MET  2.  Pt will report subjective decrease in throat clear by 75% by LTG date Baseline:  Goal status:MET  3.  Pt will report subjective decrease in coughing by 75% by LTG date Baseline:  Goal status: ONGOING  4.  Pt will demonstrate improved  vocal quality for 80% of unstructured conversations x2 given occasional min A  Baseline:  Goal status: MET  5.  Pt will report improvements on VCD PROM by 2 points by LTG date Baseline:  Goal status: ONGOING   ASSESSMENT:  CLINICAL IMPRESSION: Patient Dot is a 68 y.o. F who was seen today for ST tx for chronic cough since 2023. Hx of allergies and GERD. Reports frequent coughing, particularly at night, with usual phlegm expectoration. Today, pt exhibited intermittent throat clears and coughing.  Moderate dysphonia present. Pt's voice characterized as hoarse, rough, and intermittently breathy. Pt reports hoarseness impacting social communication at family events and talking on telephone. Continued education and instruction of voice exercises and vocal hygiene protocol. Pt would benefit from skilled ST to address aforementioned deficits to improve QoL and enhance communication efficacy.   OBJECTIVE IMPAIRMENTS: include voice disorder and chronic cough. These impairments are limiting patient from effectively communicating at home and in community.Factors affecting potential to achieve goals and functional outcome are previous level of function. Patient will benefit from skilled SLP services to address above impairments and improve overall function.  REHAB POTENTIAL: Good  PLAN:  SLP FREQUENCY: 1-2x/week  SLP DURATION: 10 weeks  PLANNED INTERVENTIONS: Environmental controls, Cueing hierachy, Functional tasks, Multimodal communication approach, SLP instruction and feedback, Compensatory strategies, Patient/family education, [phone] Treatment of speech (30 or 45 min) , [id number] Treatment of swallowing function, and [id number]- Speech Eval Behavioral Qualitative Voice Resonance    Mathis Leita Caldron, CCC-SLP 12/19/2023, 1:09 PM

## 2023-12-20 NOTE — Therapy (Unsigned)
 OUTPATIENT SPEECH LANGUAGE PATHOLOGY VOICE TREATMENT   Patient Name: Brianna Santana MRN: [id number] DOB:1955-12-22, 68 y.o., female Today's Date: 12/21/2023  PCP: Haze Kingfisher MD REFERRING PROVIDER: Geronimo Amel, MD  END OF SESSION:  End of Session - 12/21/23 01-05-08     Visit Number 9    Number of Visits 13    Date for SLP Re-Evaluation 01/18/24    Authorization Type [employer]    SLP Start Time 1225    SLP Stop Time  1310    SLP Time Calculation (min) 45 min    Activity Tolerance Patient tolerated treatment well             Past Medical History:  Diagnosis Date   Arthritis    Asthma    C. difficile colitis 01-04-2010 and 07/2010   Cervical dysplasia    Crohn disease (HCC)    Crohn's disease (HCC)    HSV infection    Hyperplastic polyp of intestine 09/2007   Osteopenia 01-05-19   T score -2.0 FRAX 3.7% / 0.3%   Thyroid  disease    Urinary incontinence    Past Surgical History:  Procedure Laterality Date   BREAST CYST ASPIRATION Right    2000   BREAST EXCISIONAL BIOPSY Right    1980's   COLONOSCOPY  09/18/2013   COLPOSCOPY     FLEXIBLE SIGMOIDOSCOPY  03/29/2012   Procedure: FLEXIBLE SIGMOIDOSCOPY;  Surgeon: Gwendlyn ONEIDA Buddy, MD,FACG;  Location: [facility];  Service: Endoscopy;  Laterality: N/A;   GANGLION CYST EXCISION Left    POLYPECTOMY     TONSILLECTOMY     TOTAL ABDOMINAL HYSTERECTOMY  2000   TRIGGER FINGER RELEASE     WRIST SURGERY Left January 05, 2020   Patient Active Problem List   Diagnosis Date Noted   COVID-19 virus infection 02/24/2022   Moderate persistent asthma with (acute) exacerbation 02/24/2022   Trigger thumb, left thumb 03/26/2020   Stenosing tenosynovitis of wrist 03/26/2020   Perennial allergic rhinitis 12/20/2016   Cough 05/10/2016   Acute left-sided thoracic back pain 04/05/2016   Routine general medical examination at a health care facility 05/02/2015   Thyroid  mass 01/16/2015   Drug allergy  07/30/2014   Crohn's disease  (HCC) 03/18/2014   Osteopenia 09/11/2013   Hyperthyroidism    GERD 11/02/2007   Moderate persistent asthma without complication 10/31/2007    Onset date: 10/07/2023 (referral date)  REFERRING DIAG: R05.3,U09.9 (ICD-10-CM) - COVID-19 long hauler manifesting chronic cough R05.3 (ICD-10-CM) - Chronic cough R49.0 (ICD-10-CM) - Hoarse voice quality J38.7 (ICD-10-CM) - Irritable larynx syndrome J32.9 (ICD-10-CM) - Chronic sinusitis, unspecified location R91.8 (ICD-10-CM) - Multiple lung nodules on CT  THERAPY DIAG: Other voice and resonance disorders  Dysphagia, unspecified type  Rationale for Evaluation and Treatment: Rehabilitation  SUBJECTIVE:   SUBJECTIVE STATEMENT: They say it (esophagus) needs to be stretched. I don't know about that Pt accompanied by: self  PERTINENT HISTORY: Brianna Santana 68 y.o. -new consult for chronic cough.  She tells me that at baseline she is has a history of seasonal allergies that gets me all the time and this has been on since her child.  But most pronounced is that in 2022/01/04 she suffered from COVID-19 for which she took Paxlovid  treated as an outpatient.  Since then she has got severe chronic cough.  RSI cough score is below.  It is rated as moderate right now.  Since January she has been on Trelegy which has helped somewhat but she says she wakes up in  the middle of the night cough is made worse by drinking milk or eating pork.  There is no clear-cut relieving factors.  There is chronic sinus drainage associated with it there is some wheezing associated with it there is also sputum of clear color and also occasionally yellow.  There are some mild associated shortness of breath.  Her exam nitric oxide  test today is 9 ppb..  This is normal.  She is on Trelegy.  There is no fever.  She says the cough wakes her up in the night.  Is also present in the daytime.   She does have acid reflux and is on Pepcid  and PPI She does not have hypertension - She does  have chronic sinus drainage. She is not on ACE inhibitor's.  PAIN: Are you having pain? No  FALLS: Has patient fallen in last 6 months? No  LIVING ENVIRONMENT: Lives with: lives with their family Lives in: House/apartment  PLOF:Level of assistance: Independent with ADLs, Independent with IADLs Employment: Retired  PATIENT GOALS: get voice cleared back up. Stop all this coughing   OBJECTIVE:  Note: Objective measures were completed at Evaluation unless otherwise noted.  COGNITION: Overall cognitive status: Within functional limits for tasks assessed  SOCIAL HISTORY: Occupation: Retired Facilities manager) Water intake: optimal Caffeine/alcohol intake: minimal  Daily voice use: moderate (limited d/t negative feedback)  PHYSICAL SYMPTOMS: Do you have any burning, soreness, tickling, or irritation in your throat? Yes  If yes, can you describe the sensation? Tickle Do you sometimes have a sensation of a lump in your throat?  Yes - intermittent  Do you have any aching or tightness in your throat? No Do you ever feel tension in your neck area? No - noted enlarged neck area  Does your voice get tired easily? Unsure, has not noted  Do you feel as if you have to strain to produce voice? Yes Do you feel as if you need to cough or clear your throat a lot? Yes - a lot  Do you ever lose your voice completely? Yes - intermittent  Do you ever have difficulty swallowing? Yes - sometimes feels like gets choked with solids, possibly liquids  Do you have difficulty projecting your voice? Yes   PERCEPTUAL VOICE ASSESSMENT: Voice quality: hoarse, harsh, rough, strained, low vocal intensity, and vocal fatigue Vocal abuse: habitual throat clearing and chronic cough  Resonance: hyponasal Respiratory function: thoracic breathing and clavicular breathing  OBJECTIVE VOICE ASSESSMENT: Maximum phonation time for sustained ah: 14 seconds, pitch breaks - resulted in coughing  Conversational loudness  average: 70 dB Conversational loudness range: 68-72 dB S/z ratio: 1.18 (Suggestive of dysfunction >1.0); questionable diplophonia? Breathy quality  PATIENT REPORTED OUTCOME MEASURES (PROM): Vocal Cord Dysfunction Questionnaire (VCD- Q): 54                                                                                                          TREATMENT DATE:  12/21/23: Enters with intermittently clear vocal quality.  Reports ongoing completion of HEP. Targeted warm-up of PhoRTE, with usual min A to  achieve high pitch and low pitch variations during oral reading tasks. Usual clear vocal quality achieved during structured tasks. As conversation progressed, inconsistent carryover of clear vocal quality exhibited. C/o persistent post nasal drip. Benefited from intermittent resets via hum and verbal cues to project for optimal vocal clarity. Implementing throat clear alternatives with mod I.  12/19/23: Enters with clear voice - she saw pulmonology today and was unable to achieve a clear voice with the pulmonologist. She said I just couldn't get my voice out, I don't know why. HEP with rare min A and clear voice consistency. In conversation focusing on carryover of increased vocal intensity and clear phonation with usual mod A - intermittent dysphonia persists.   12/12/23: Enters with clear voice - HEP for high intensity voice exercises (PhoRTE with usual mod modeling) In structured conversation exercises, Taniyah maintained 72 +dB with mod I. She uses throat clear alternatives with mod I - reports cough is over 50% improved and has started alginate - required correction to take alginate after meals as she was taking it before breakfast. In moderately complex conversation re: current events she maintained clear voice and maintained WNL volume.   12/07/23: Enters with mildly hoarse voice - she completes warm up with flow phonation phrases with clear phonation and rare min A 20/20 phrases. Targeted carryover of  strategies to maintain clear phonation in conversation. ENT dx with vocal fold atrophy - trial of high intensity voice exercises for vocal fold atrophy - 10 second sustained ah! With clear phonation average 84dB - rare min A - pitch glides with high intensity voice with clear phonation rare min A  12/05/23: Reviewed ENT consult - she has not yet ordered alginate - she plans to order this today. Resonant and flow exercises achieved clear phonation with rare min A. Targeted carryover of strategies to maintain clear phonation reading and answering conversation questions - she required usual min verbal cues, gesture cues to correct hoarse rough voice. I had her spouse come in half way through to observe cueing and that Liadan can achieve clear phonation when she is mindful to carryover her resonant and flow phonation. Throat clear 2x, she required verbal cues to ID throat clearing - after throat clear, she independently used swallows and sniff-blows to avoid subsequent throat clears. She has stopped Gabapentin  and continues to report reduced coughing, mostly coughing in am and when she encounters smells - (smoke at a cookout- she reports she moved away from the smell and used relaxed sniff-blow to limit cough. MBSS scheduled 7/17.  11/29/23: Reported consistent completion of HEP. Conducted warm-up with SOVTE and flow phonation, with rare min A to complete. Introduced resonant exercises today due to evident back focused phonation, with handout provided. HEP updated with resonant exercises recommended up to hum + words. Cough suppression techniques intermittently utilized x3 with rare cues. Intermittent throat clears evident following initial and subsequent sips of water. Usual fading to occasional cues required to swallow hard. Pt c/o persistent phlegm impacting voice and swallow function. Discussed upcoming ENT visit.   11/16/23: Endorses completing HEP - today she demonstrated SOVTE in water with rare min A.  Gabapentin  has reduced coughing, however Lauryl demonstrates abdominal sniff blow with rare min A today - verbal cues. She reports using this to stop coughing at night. She continues to eliminate throat clearing behavior - no throat clearing today. Voice hoarse - targeted flow phonation with tissue for feedback, achieving clear phonation at syllable, word and phrase level  flow phonation  phrases with occasional min verbal cues to ID and correct hoarse, pressed voice. In conversation, Trachelle required usual mod verbal cues to ID and self correct voice and verbal cues to ID when she is speaking on residual air.   11/14/23: Endorsed some carryover of SLP recommendations for throat clear alternatives and cough suppression techniques initiated at eval. Reviewed targeted techniques with no throat clearing and rare cough x1 evidenced today. Introduced SOVTE and flow phonation techniques to address pressed speech and hoarseness. Usual min-mod A provided to optimize breath support/reduce breath hold, relax neck and upper body, and increase vocal intensity. Updated HEP with SOVTE and flow phonation but suspect additional education and training next session would be beneficial due to evident tension.   11/09/23: ST evaluation and POC complete. Initiated education and instruction of throat clear alternatives and cough suppression techniques. Modeling and handout provided. Additional education warranted.    PATIENT EDUCATION: Education details: see above Person educated: Patient Education method: Explanation, Demonstration, and Verbal cues Education comprehension: verbalized understanding, returned demonstration, and needs further education  GOALS: Goals reviewed with patient? Yes  SHORT TERM GOALS: Target date: 12/07/2023  Pt will demonstrate throat clear alternative for 50% of opportunities in therapy given occasional min A Baseline: Goal status: MET  2.  Pt will demonstrate cough suppression technique for  50% of opportunities in therapy given occasional min A  Baseline:  Goal status: MET  3.  Pt will accurately demonstrate recommended vocal exercises given occasional min A (Pending ENT exam) Baseline:  Goal status: MET  LONG TERM GOALS: Target date: 01/18/2024  Pt will complete daily HEP for 5/7 days Baseline:  Goal status: MET  2.  Pt will report subjective decrease in throat clear by 75% by LTG date Baseline:  Goal status:MET  3.  Pt will report subjective decrease in coughing by 75% by LTG date Baseline:  Goal status: ONGOING  4.  Pt will demonstrate improved vocal quality for 80% of unstructured conversations x2 given occasional min A  Baseline:  Goal status: MET  5.  Pt will report improvements on VCD PROM by 2 points by LTG date Baseline:  Goal status: ONGOING   ASSESSMENT:  CLINICAL IMPRESSION: Patient Dot is a 68 y.o. F who was seen today for ST tx for chronic cough since 2023. Hx of allergies and GERD. Continued education and instruction of voice exercises and vocal hygiene protocol. Pt would benefit from skilled ST to address aforementioned deficits to improve QoL and enhance communication efficacy.   OBJECTIVE IMPAIRMENTS: include voice disorder and chronic cough. These impairments are limiting patient from effectively communicating at home and in community.Factors affecting potential to achieve goals and functional outcome are previous level of function. Patient will benefit from skilled SLP services to address above impairments and improve overall function.  REHAB POTENTIAL: Good  PLAN:  SLP FREQUENCY: 1-2x/week  SLP DURATION: 10 weeks  PLANNED INTERVENTIONS: Environmental controls, Cueing hierachy, Functional tasks, Multimodal communication approach, SLP instruction and feedback, Compensatory strategies, Patient/family education, [phone] Treatment of speech (30 or 45 min) , [id number] Treatment of swallowing function, and [id number]- Speech Eval Behavioral Qualitative  Voice Resonance    Comer LILLETTE Louder, CCC-SLP 12/21/2023, 1:14 PM

## 2023-12-21 ENCOUNTER — Ambulatory Visit

## 2023-12-21 DIAGNOSIS — R498 Other voice and resonance disorders: Secondary | ICD-10-CM

## 2023-12-21 DIAGNOSIS — R1310 Dysphagia, unspecified: Secondary | ICD-10-CM

## 2023-12-21 NOTE — Patient Instructions (Addendum)
 Recommendations: Reset your voice with a hmmm or ooo if it gets worse. Feel that forward buzz  Be mindful of when your voice gets worse:  Are you talking longer? Were you are straining? How was your volume?

## 2023-12-22 ENCOUNTER — Ambulatory Visit (INDEPENDENT_AMBULATORY_CARE_PROVIDER_SITE_OTHER)

## 2023-12-22 DIAGNOSIS — J309 Allergic rhinitis, unspecified: Secondary | ICD-10-CM

## 2023-12-22 MED ORDER — EPINEPHRINE 0.3 MG/0.3ML IJ SOAJ: 0.3000 mg | INTRAMUSCULAR | 1 refills | Status: AC | PRN

## 2023-12-26 ENCOUNTER — Ambulatory Visit (INDEPENDENT_AMBULATORY_CARE_PROVIDER_SITE_OTHER)

## 2023-12-26 ENCOUNTER — Ambulatory Visit

## 2023-12-26 DIAGNOSIS — J309 Allergic rhinitis, unspecified: Secondary | ICD-10-CM | POA: Diagnosis not present

## 2023-12-26 DIAGNOSIS — R498 Other voice and resonance disorders: Secondary | ICD-10-CM

## 2023-12-26 DIAGNOSIS — R1310 Dysphagia, unspecified: Secondary | ICD-10-CM

## 2023-12-26 NOTE — Therapy (Signed)
 OUTPATIENT SPEECH LANGUAGE PATHOLOGY VOICE TREATMENT (PROGRESS NOTE/DISCHARGE)   Patient Name: Brianna Santana MRN: [id number] DOB:08/31/1955, 68 y.o., female Today's Date: 12/26/2023  PCP: Brianna Kingfisher MD REFERRING PROVIDER: Geronimo Amel, MD  END OF SESSION:  End of Session - 12/26/23 1236     Visit Number 10    Number of Visits 13    Date for SLP Re-Evaluation 01/18/24    Authorization Type [employer]    SLP Start Time 1235    SLP Stop Time  1315    SLP Time Calculation (min) 40 min    Activity Tolerance Patient tolerated treatment well         SPEECH THERAPY DISCHARGE SUMMARY  Visits from Start of Care: 10  Current functional level related to goals / functional outcomes: Presents with improved vocal quality with use of trained techniques. Denied dysphagia this date.   Remaining deficits: Allergies, post nasal drip, GERD    Education / Equipment: Throat clear alternatives, cough suppression techniques, voice exercises, functional application    Patient agrees to discharge. Patient goals were met. Patient is being discharged due to being pleased with the current functional level.[name]    Speech Therapy Progress Note  Dates of Reporting Period: 11/09/23 to current  Objective Reports of Subjective Statement: Pt demonstrates improved vocal quality with trained techniques. Benefits from rare to occasional min A in conversation.   Objective Measurements: Improved vocal quality & reduced throat clearing and coughing   Goal Update: goals met  Plan: d/c today  Reason Skilled Services are Required: d/c today     Past Medical History:  Diagnosis Date   Arthritis    Asthma    C. difficile colitis 2011 and 07/2010   Cervical dysplasia    Crohn disease (HCC)    Crohn's disease (HCC)    HSV infection    Hyperplastic polyp of intestine 09/2007   Osteopenia 11/2018   T score -2.0 FRAX 3.7% / 0.3%   Thyroid  disease    Urinary incontinence     Past Surgical History:  Procedure Laterality Date   BREAST CYST ASPIRATION Right    2000   BREAST EXCISIONAL BIOPSY Right    1980's   COLONOSCOPY  09/18/2013   COLPOSCOPY     FLEXIBLE SIGMOIDOSCOPY  03/29/2012   Procedure: FLEXIBLE SIGMOIDOSCOPY;  Surgeon: Brianna ONEIDA Buddy, MD,FACG;  Location: [facility];  Service: Endoscopy;  Laterality: N/A;   GANGLION CYST EXCISION Left    POLYPECTOMY     TONSILLECTOMY     TOTAL ABDOMINAL HYSTERECTOMY  2000   TRIGGER FINGER RELEASE     WRIST SURGERY Left 2021   Patient Active Problem List   Diagnosis Date Noted   COVID-19 virus infection 02/24/2022   Moderate persistent asthma with (acute) exacerbation 02/24/2022   Trigger thumb, left thumb 03/26/2020   Stenosing tenosynovitis of wrist 03/26/2020   Perennial allergic rhinitis 12/20/2016   Cough 05/10/2016   Acute left-sided thoracic back pain 04/05/2016   Routine general medical examination at a health care facility 05/02/2015   Thyroid  mass 01/16/2015   Drug allergy  07/30/2014   Crohn's disease (HCC) 03/18/2014   Osteopenia 09/11/2013   Hyperthyroidism    GERD 11/02/2007   Moderate persistent asthma without complication 10/31/2007    Onset date: 10/07/2023 (referral date)  REFERRING DIAG: R05.3,U09.9 (ICD-10-CM) - COVID-19 long hauler manifesting chronic cough R05.3 (ICD-10-CM) - Chronic cough R49.0 (ICD-10-CM) - Hoarse voice quality J38.7 (ICD-10-CM) - Irritable larynx syndrome J32.9 (ICD-10-CM) - Chronic sinusitis, unspecified location  R91.8 (ICD-10-CM) - Multiple lung nodules on CT  THERAPY DIAG: Other voice and resonance disorders  Dysphagia, unspecified type  Rationale for Evaluation and Treatment: Rehabilitation  SUBJECTIVE:   SUBJECTIVE STATEMENT: It's really good re: voice  Pt accompanied by: self  PERTINENT HISTORY: Brianna Santana 68 y.o. -new consult for chronic cough.  She tells me that at baseline she is has a history of seasonal allergies that gets  me all the time and this has been on since her child.  But most pronounced is that in 01-18-2022 she suffered from COVID-19 for which she took Paxlovid  treated as an outpatient.  Since then she has got severe chronic cough.  RSI cough score is below.  It is rated as moderate right now.  Since January she has been on Trelegy which has helped somewhat but she says she wakes up in the middle of the night cough is made worse by drinking milk or eating pork.  There is no clear-cut relieving factors.  There is chronic sinus drainage associated with it there is some wheezing associated with it there is also sputum of clear color and also occasionally yellow.  There are some mild associated shortness of breath.  Her exam nitric oxide  test today is 9 ppb..  This is normal.  She is on Trelegy.  There is no fever.  She says the cough wakes her up in the night.  Is also present in the daytime.   She does have acid reflux and is on Pepcid  and PPI She does not have hypertension - She does have chronic sinus drainage. She is not on ACE inhibitor's.  PAIN: Are you having pain? No  FALLS: Has patient fallen in last 6 months? No  LIVING ENVIRONMENT: Lives with: lives with their family Lives in: House/apartment  PLOF:Level of assistance: Independent with ADLs, Independent with IADLs Employment: Retired  PATIENT GOALS: get voice cleared back up. Stop all this coughing   OBJECTIVE:  Note: Objective measures were completed at Evaluation unless otherwise noted.  COGNITION: Overall cognitive status: Within functional limits for tasks assessed  SOCIAL HISTORY: Occupation: Retired Facilities manager) Water intake: optimal Caffeine/alcohol intake: minimal  Daily voice use: moderate (limited d/t negative feedback)  PHYSICAL SYMPTOMS: Do you have any burning, soreness, tickling, or irritation in your throat? Yes  If yes, can you describe the sensation? Tickle Do you sometimes have a sensation of a lump in your throat?   Yes - intermittent  Do you have any aching or tightness in your throat? No Do you ever feel tension in your neck area? No - noted enlarged neck area  Does your voice get tired easily? Unsure, has not noted  Do you feel as if you have to strain to produce voice? Yes Do you feel as if you need to cough or clear your throat a lot? Yes - a lot  Do you ever lose your voice completely? Yes - intermittent  Do you ever have difficulty swallowing? Yes - sometimes feels like gets choked with solids, possibly liquids  Do you have difficulty projecting your voice? Yes   PERCEPTUAL VOICE ASSESSMENT: Voice quality: hoarse, harsh, rough, strained, low vocal intensity, and vocal fatigue Vocal abuse: habitual throat clearing and chronic cough  Resonance: hyponasal Respiratory function: thoracic breathing and clavicular breathing  OBJECTIVE VOICE ASSESSMENT: Maximum phonation time for sustained ah: 14 seconds, pitch breaks - resulted in coughing  Conversational loudness average: 70 dB Conversational loudness range: 68-72 dB S/z ratio: 1.18 (Suggestive of dysfunction >  1.0); questionable diplophonia? Breathy quality  PATIENT REPORTED OUTCOME MEASURES (PROM): Vocal Cord Dysfunction Questionnaire (VCD- Q): 54                                                                                                          TREATMENT DATE:  12/26/23: Reports improved voice and reduction in throat clearing/coughing with use of trained techniques. Denied dysphagia this date. Maintained clear voice in conversation with rare min A. VCD-Q re-administered today, with score of 37 rated (baseline of 54). Reviewed expected HEP; pt verbalized understanding. Pt is pleased with current progress and agreeable to ST discharge today. Pt denied any questions.   12/21/23: Enters with intermittently clear vocal quality.  Reports ongoing completion of HEP. Targeted warm-up of PhoRTE, with usual min A to achieve high pitch and low pitch  variations during oral reading tasks. Usual clear vocal quality achieved during structured tasks. As conversation progressed, inconsistent carryover of clear vocal quality exhibited. C/o persistent post nasal drip. Benefited from intermittent resets via hum and verbal cues to project for optimal vocal clarity. Implementing throat clear alternatives with mod I.  12/19/23: Enters with clear voice - she saw pulmonology today and was unable to achieve a clear voice with the pulmonologist. She said I just couldn't get my voice out, I don't know why. HEP with rare min A and clear voice consistency. In conversation focusing on carryover of increased vocal intensity and clear phonation with usual mod A - intermittent dysphonia persists.   12/12/23: Enters with clear voice - HEP for high intensity voice exercises (PhoRTE with usual mod modeling) In structured conversation exercises, Brianna Santana maintained 72 +dB with mod I. She uses throat clear alternatives with mod I - reports cough is over 50% improved and has started alginate - required correction to take alginate after meals as she was taking it before breakfast. In moderately complex conversation re: current events she maintained clear voice and maintained WNL volume.   12/07/23: Enters with mildly hoarse voice - she completes warm up with flow phonation phrases with clear phonation and rare min A 20/20 phrases. Targeted carryover of strategies to maintain clear phonation in conversation. ENT dx with vocal fold atrophy - trial of high intensity voice exercises for vocal fold atrophy - 10 second sustained ah! With clear phonation average 84dB - rare min A - pitch glides with high intensity voice with clear phonation rare min A  12/05/23: Reviewed ENT consult - she has not yet ordered alginate - she plans to order this today. Resonant and flow exercises achieved clear phonation with rare min A. Targeted carryover of strategies to maintain clear phonation reading and  answering conversation questions - she required usual min verbal cues, gesture cues to correct hoarse rough voice. I had her spouse come in half way through to observe cueing and that Brianna Santana can achieve clear phonation when she is mindful to carryover her resonant and flow phonation. Throat clear 2x, she required verbal cues to ID throat clearing - after throat clear, she independently used swallows and sniff-blows to  avoid subsequent throat clears. She has stopped Gabapentin  and continues to report reduced coughing, mostly coughing in am and when she encounters smells - (smoke at a cookout- she reports she moved away from the smell and used relaxed sniff-blow to limit cough. MBSS scheduled 7/17.  11/29/23: Reported consistent completion of HEP. Conducted warm-up with SOVTE and flow phonation, with rare min A to complete. Introduced resonant exercises today due to evident back focused phonation, with handout provided. HEP updated with resonant exercises recommended up to hum + words. Cough suppression techniques intermittently utilized x3 with rare cues. Intermittent throat clears evident following initial and subsequent sips of water. Usual fading to occasional cues required to swallow hard. Pt c/o persistent phlegm impacting voice and swallow function. Discussed upcoming ENT visit.   11/16/23: Endorses completing HEP - today she demonstrated SOVTE in water with rare min A. Gabapentin  has reduced coughing, however Brianna Santana demonstrates abdominal sniff blow with rare min A today - verbal cues. She reports using this to stop coughing at night. She continues to eliminate throat clearing behavior - no throat clearing today. Voice hoarse - targeted flow phonation with tissue for feedback, achieving clear phonation at syllable, word and phrase level  flow phonation phrases with occasional min verbal cues to ID and correct hoarse, pressed voice. In conversation, Brianna Santana required usual mod verbal cues to ID and self  correct voice and verbal cues to ID when she is speaking on residual air.   11/14/23: Endorsed some carryover of SLP recommendations for throat clear alternatives and cough suppression techniques initiated at eval. Reviewed targeted techniques with no throat clearing and rare cough x1 evidenced today. Introduced SOVTE and flow phonation techniques to address pressed speech and hoarseness. Usual min-mod A provided to optimize breath support/reduce breath hold, relax neck and upper body, and increase vocal intensity. Updated HEP with SOVTE and flow phonation but suspect additional education and training next session would be beneficial due to evident tension.   11/09/23: ST evaluation and POC complete. Initiated education and instruction of throat clear alternatives and cough suppression techniques. Modeling and handout provided. Additional education warranted.    PATIENT EDUCATION: Education details: see above Person educated: Patient Education method: Explanation, Demonstration, and Verbal cues Education comprehension: verbalized understanding, returned demonstration, and needs further education  GOALS: Goals reviewed with patient? Yes  SHORT TERM GOALS: Target date: 12/07/2023  Pt will demonstrate throat clear alternative for 50% of opportunities in therapy given occasional min A Baseline: Goal status: MET  2.  Pt will demonstrate cough suppression technique for 50% of opportunities in therapy given occasional min A  Baseline:  Goal status: MET  3.  Pt will accurately demonstrate recommended vocal exercises given occasional min A (Pending ENT exam) Baseline:  Goal status: MET  LONG TERM GOALS: Target date: 01/18/2024  Pt will complete daily HEP for 5/7 days Baseline:  Goal status: MET  2.  Pt will report subjective decrease in throat clear by 75% by LTG date Baseline:  Goal status:MET  3.  Pt will report subjective decrease in coughing by 75% by LTG date Baseline:  Goal status:  MET  4.  Pt will demonstrate improved vocal quality for 80% of unstructured conversations x2 given occasional min A  Baseline:  Goal status: MET  5.  Pt will report improvements on VCD PROM by 2 points by LTG date Baseline:  Goal status: MET   ASSESSMENT:  CLINICAL IMPRESSION: Patient Dot is a 68 y.o. F who was seen today for  ST tx for chronic cough since 2023. Hx of allergies and GERD. Completed education and instruction of voice exercises and vocal hygiene protocol. Pt able to achieve clear vocal quality in conversation with rare min A. Pt is pleased with current level of functioning and agreeable to ST discharge this date.   OBJECTIVE IMPAIRMENTS: include voice disorder and chronic cough. These impairments are limiting patient from effectively communicating at home and in community.Factors affecting potential to achieve goals and functional outcome are previous level of function. Patient will benefit from skilled SLP services to address above impairments and improve overall function.  REHAB POTENTIAL: Good  PLAN:  SLP FREQUENCY: 1-2x/week  SLP DURATION: 10 weeks  PLANNED INTERVENTIONS: Environmental controls, Cueing hierachy, Functional tasks, Multimodal communication approach, SLP instruction and feedback, Compensatory strategies, Patient/family education, [phone] Treatment of speech (30 or 45 min) , 92526 Treatment of swallowing function, and [id number]- Speech Eval Behavioral Qualitative Voice Resonance    Comer LILLETTE Louder, CCC-SLP 12/26/2023, 12:37 PM

## 2023-12-29 ENCOUNTER — Ambulatory Visit (INDEPENDENT_AMBULATORY_CARE_PROVIDER_SITE_OTHER)

## 2023-12-29 DIAGNOSIS — J309 Allergic rhinitis, unspecified: Secondary | ICD-10-CM | POA: Diagnosis not present

## 2024-01-05 ENCOUNTER — Ambulatory Visit (INDEPENDENT_AMBULATORY_CARE_PROVIDER_SITE_OTHER)

## 2024-01-05 DIAGNOSIS — J309 Allergic rhinitis, unspecified: Secondary | ICD-10-CM | POA: Diagnosis not present

## 2024-01-12 ENCOUNTER — Ambulatory Visit (INDEPENDENT_AMBULATORY_CARE_PROVIDER_SITE_OTHER)

## 2024-01-12 DIAGNOSIS — J309 Allergic rhinitis, unspecified: Secondary | ICD-10-CM

## 2024-01-16 ENCOUNTER — Ambulatory Visit (INDEPENDENT_AMBULATORY_CARE_PROVIDER_SITE_OTHER)

## 2024-01-16 DIAGNOSIS — J309 Allergic rhinitis, unspecified: Secondary | ICD-10-CM

## 2024-01-24 ENCOUNTER — Ambulatory Visit (INDEPENDENT_AMBULATORY_CARE_PROVIDER_SITE_OTHER)

## 2024-01-24 DIAGNOSIS — J309 Allergic rhinitis, unspecified: Secondary | ICD-10-CM

## 2024-01-27 ENCOUNTER — Ambulatory Visit (INDEPENDENT_AMBULATORY_CARE_PROVIDER_SITE_OTHER)

## 2024-01-27 DIAGNOSIS — J309 Allergic rhinitis, unspecified: Secondary | ICD-10-CM | POA: Diagnosis not present

## 2024-02-02 ENCOUNTER — Encounter: Payer: Self-pay | Admitting: Primary Care

## 2024-02-06 ENCOUNTER — Ambulatory Visit
Admission: RE | Admit: 2024-02-06 | Discharge: 2024-02-06 | Disposition: A | Discharge status: Home / Self Care | Source: Ambulatory Visit | Attending: Primary Care

## 2024-02-06 DIAGNOSIS — R053 Chronic cough: Secondary | ICD-10-CM

## 2024-02-06 DIAGNOSIS — R918 Other nonspecific abnormal finding of lung field: Secondary | ICD-10-CM

## 2024-02-09 ENCOUNTER — Ambulatory Visit (INDEPENDENT_AMBULATORY_CARE_PROVIDER_SITE_OTHER)

## 2024-02-09 DIAGNOSIS — J309 Allergic rhinitis, unspecified: Secondary | ICD-10-CM | POA: Diagnosis not present

## 2024-02-13 ENCOUNTER — Ambulatory Visit (INDEPENDENT_AMBULATORY_CARE_PROVIDER_SITE_OTHER)

## 2024-02-13 DIAGNOSIS — J309 Allergic rhinitis, unspecified: Secondary | ICD-10-CM

## 2024-02-15 ENCOUNTER — Ambulatory Visit: Payer: Self-pay | Admitting: Primary Care

## 2024-02-21 ENCOUNTER — Ambulatory Visit (INDEPENDENT_AMBULATORY_CARE_PROVIDER_SITE_OTHER): Admitting: Allergy & Immunology

## 2024-02-21 ENCOUNTER — Other Ambulatory Visit: Payer: Self-pay

## 2024-02-21 VITALS — BP 122/78 | HR 83 | Temp 98.1°F | Resp 18 | Ht 64.17 in | Wt 176.6 lb

## 2024-02-21 DIAGNOSIS — R911 Solitary pulmonary nodule: Secondary | ICD-10-CM

## 2024-02-21 DIAGNOSIS — J4540 Moderate persistent asthma, uncomplicated: Secondary | ICD-10-CM

## 2024-02-21 DIAGNOSIS — J309 Allergic rhinitis, unspecified: Secondary | ICD-10-CM

## 2024-02-21 DIAGNOSIS — J3089 Other allergic rhinitis: Secondary | ICD-10-CM | POA: Diagnosis not present

## 2024-02-21 DIAGNOSIS — K219 Gastro-esophageal reflux disease without esophagitis: Secondary | ICD-10-CM | POA: Diagnosis not present

## 2024-02-21 MED ORDER — SPIRIVA RESPIMAT 2.5 MCG/ACT IN AERS: 2.0000 | INHALATION_SPRAY | RESPIRATORY_TRACT | 1 refills | Status: AC

## 2024-02-21 NOTE — Patient Instructions (Addendum)
 1. Moderate persistent asthma, uncomplicated - Lung testing looks excellent today.  - Continue to follow up  with Dr. Geronimo (next appoointment October 21st).  - Refills sent in for Spiriva .  - Daily controller medication(s): Singulair  10mg  daily + Advair 250/50mcg one puff twice daily + Spiriva  2.5mcg two puffs once daily - Rescue medications: albuterol  4 puffs every 4-6 hours as needed  - Asthma control goals:  * Full participation in all desired activities (may need albuterol  before activity) * Albuterol  use two time or less a week on average (not counting use with activity) * Cough interfering with sleep two time or less a month * Oral steroids no more than once a year * No hospitalizations  2. Chronic perennial rhinitis (cockroach, dust mite, molds) - completed 5 years maintenance  - Continue with Astelin  1-2 sprays up to twice daily.  - Continue with Flonase 1-2 sprays up to twice daily. - ADD ON carbinoxamine  4mg  up to twice daily (this was cause sleepiness).  - Continue with allergy  shots at the same schedule.   3. GERD - Continue with Dexilant  60 mg daily.  - Continue with Pepcid  40mg  twice daily as well.  4. Return in about 1 year (around 02/20/2025). You can have the follow up appointment with Dr. Iva or a Nurse Practicioner (our Nurse Practitioners are excellent and always have Physician oversight!).    Please inform us  of any Emergency Department visits, hospitalizations, or changes in symptoms. Call us  before going to the ED for breathing or allergy  symptoms since we might be able to fit you in for a sick visit. Feel free to contact us  anytime with any questions, problems, or concerns.  It was a pleasure to see you again today!  Websites that have reliable patient information: 1. American Academy of Asthma, Allergy , and Immunology: www.aaaai.org 2. Food Allergy  Research and Education (FARE): foodallergy.org 3. Mothers of Asthmatics:  http://www.asthmacommunitynetwork.org 4. American College of Allergy , Asthma, and Immunology: www.acaai.org      "Like" us  on Facebook and Instagram for our latest updates!      A healthy democracy works best when [employer] participate! Make sure you are registered to vote! If you have moved or changed any of your contact information, you will need to get this updated before voting! Scan the QR codes below to learn more!

## 2024-02-21 NOTE — Progress Notes (Unsigned)
   FOLLOW UP  Date of Service/Encounter:  02/21/24   Assessment:   Moderate persistent asthma without complication - with worsening symptoms with the onset of the allergy  season   Prolonged coughing illness x 2 to 3 months with bilateral maxillary sinus tenderness    Perennial allergic rhinitis  (cockroach, molds, dust mites) - on allergen immunotherapy with maintenance reached in September 2019, stopped shots in 2024 and now interested in resuming them   Current allergic flare   GERD - doing better on PPI and H2 blocker    Thyroid  nodule - with a history of this dating back to 2000 (we are ordering another thyroid  ultrasound)   Vaccinated to COVID19   Sees a Naturalist [place])   Plan/Recommendations:   There are no Patient Instructions on file for this visit.   Subjective:   Brianna Santana is a 68 y.o. female presenting today for follow up of  Chief Complaint  Patient presents with   Follow-up    Brianna Santana has a history of the following: Patient Active Problem List   Diagnosis Date Noted   COVID-19 virus infection 02/24/2022   Moderate persistent asthma with (acute) exacerbation 02/24/2022   Trigger thumb, left thumb 03/26/2020   Stenosing tenosynovitis of wrist 03/26/2020   Perennial allergic rhinitis 12/20/2016   Cough 05/10/2016   Acute left-sided thoracic back pain 04/05/2016   Routine general medical examination at a health care facility 05/02/2015   Thyroid  mass 01/16/2015   Drug allergy  07/30/2014   Crohn's disease (HCC) 03/18/2014   Osteopenia 09/11/2013   Hyperthyroidism    GERD 11/02/2007   Moderate persistent asthma without complication 10/31/2007    History obtained from: chart review and {Persons; PED relatives w/patient:19415::patient}.  Discussed the use of AI scribe software for clinical note transcription with the patient and/or guardian, who gave verbal consent to proceed.  Brianna Santana is a 68 y.o. female presenting  for {Blank single:19197::a food challenge,a drug challenge,skin testing,a sick visit,an evaluation of ***,a follow up visit}.  Asthma/Respiratory Symptom History: ***  Dr. Reeves next visit is October 21st.   Allergic Rhinitis Symptom History: ***  Dr. Vallery next visit is October 3rd.   Food Allergy  Symptom History: ***  Skin Symptom History: ***  GERD Symptom History: ***  Infection Symptom History: ***  Otherwise, there have been no changes to her past medical history, surgical history, family history, or social history.    Review of systems otherwise negative other than that mentioned in the HPI.    Objective:   Blood pressure 122/78, pulse 83, temperature 98.1 F (36.7 C), temperature source Temporal, resp. rate 18, height 5' 4.17 (1.63 m), weight 176 lb 9.6 oz (80.1 kg), SpO2 96%. Body mass index is 30.15 kg/m.    Physical Exam   Diagnostic studies:    Spirometry: results normal (FEV1: 1.96/101%, FVC: 2.90/117%, FEV1/FVC: 68%).    Spirometry consistent with normal pattern.    Allergy  Studies: none        Marty Shaggy, MD  [facility]

## 2024-02-22 ENCOUNTER — Other Ambulatory Visit (INDEPENDENT_AMBULATORY_CARE_PROVIDER_SITE_OTHER)

## 2024-02-22 ENCOUNTER — Ambulatory Visit: Payer: Self-pay | Admitting: Gastroenterology

## 2024-02-22 ENCOUNTER — Ambulatory Visit: Admitting: Gastroenterology

## 2024-02-22 VITALS — BP 130/80 | HR 89 | Ht 64.0 in | Wt 176.0 lb

## 2024-02-22 DIAGNOSIS — K648 Other hemorrhoids: Secondary | ICD-10-CM | POA: Diagnosis not present

## 2024-02-22 DIAGNOSIS — K219 Gastro-esophageal reflux disease without esophagitis: Secondary | ICD-10-CM | POA: Diagnosis not present

## 2024-02-22 DIAGNOSIS — K5010 Crohn's disease of large intestine without complications: Secondary | ICD-10-CM

## 2024-02-22 DIAGNOSIS — K625 Hemorrhage of anus and rectum: Secondary | ICD-10-CM

## 2024-02-22 LAB — CBC WITH DIFFERENTIAL/PLATELET
Basophils Absolute: 0.1 K/uL (ref 0.0–0.1)
Basophils Relative: 2.2 % (ref 0.0–3.0)
Eosinophils Absolute: 0.3 K/uL (ref 0.0–0.7)
Eosinophils Relative: 9.1 % — ABNORMAL HIGH (ref 0.0–5.0)
HCT: 38.3 % (ref 36.0–46.0)
Hemoglobin: 12.8 g/dL (ref 12.0–15.0)
Lymphocytes Relative: 46 % (ref 12.0–46.0)
Lymphs Abs: 1.6 K/uL (ref 0.7–4.0)
MCHC: 33.3 g/dL (ref 30.0–36.0)
MCV: 89.8 fl (ref 78.0–100.0)
Monocytes Absolute: 0.4 K/uL (ref 0.1–1.0)
Monocytes Relative: 10.8 % (ref 3.0–12.0)
Neutro Abs: 1.1 K/uL — ABNORMAL LOW (ref 1.4–7.7)
Neutrophils Relative %: 31.9 % — ABNORMAL LOW (ref 43.0–77.0)
Platelets: 290 K/uL (ref 150.0–400.0)
RBC: 4.26 Mil/uL (ref 3.87–5.11)
RDW: 13.4 % (ref 11.5–15.5)
WBC: 3.4 K/uL — ABNORMAL LOW (ref 4.0–10.5)

## 2024-02-22 LAB — COMPREHENSIVE METABOLIC PANEL WITH GFR
ALT: 19 U/L (ref 0–35)
AST: 23 U/L (ref 0–37)
Albumin: 4.6 g/dL (ref 3.5–5.2)
Alkaline Phosphatase: 82 U/L (ref 39–117)
BUN: 14 mg/dL (ref 6–23)
CO2: 30 meq/L (ref 19–32)
Calcium: 9.7 mg/dL (ref 8.4–10.5)
Chloride: 103 meq/L (ref 96–112)
Creatinine, Ser: 0.76 mg/dL (ref 0.40–1.20)
GFR: 80.5 mL/min (ref >60.00)
Glucose, Bld: 97 mg/dL (ref 70–99)
Potassium: 3.7 meq/L (ref 3.5–5.1)
Sodium: 141 meq/L (ref 135–145)
Total Bilirubin: 0.7 mg/dL (ref 0.2–1.2)
Total Protein: 7.6 g/dL (ref 6.0–8.3)

## 2024-02-22 LAB — C-REACTIVE PROTEIN: CRP: <1 mg/dL (ref 0.5–20.0)

## 2024-02-22 MED ORDER — DEXLANSOPRAZOLE 60 MG PO CPDR
60.0000 mg | DELAYED_RELEASE_CAPSULE | Freq: Every day | ORAL | 3 refills | Status: AC
Start: 2024-02-22 — End: ?

## 2024-02-22 MED ORDER — NA SULFATE-K SULFATE-MG SULF 17.5-3.13-1.6 GM/177ML PO SOLN: 1.0000 | Freq: Once | ORAL | 0 refills | Status: AC

## 2024-02-22 MED ORDER — FAMOTIDINE 40 MG PO TABS: 40.0000 mg | ORAL_TABLET | Freq: Two times a day (BID) | ORAL | 3 refills | Status: AC

## 2024-02-22 MED ORDER — HYDROCORTISONE ACETATE 25 MG RE SUPP: 25.0000 mg | Freq: Every evening | RECTAL | 0 refills | Status: AC

## 2024-02-22 NOTE — Progress Notes (Signed)
 Chief Complaint:  blood in stools, history of Crohn's, GERD Primary GI Doctor: (previously Dr. Aneita) Dr. Shila  HPI:  Patient is a  68  year old female patient with past medical history of Crohn's disease and GERD who presents for blood in stools.  -- 11/07/2018 colonoscopy with localized mild inflammation in the cecum secondary to colitis, quiescent Crohn's disease, inflammation was found from the sigmoid colon to the descending colon, mild diverticulosis in the left colon, internal hemorrhoids.  Repeat recommended in 5 years.   -- 06/07/2022 patient seen in clinic by Dr. Aneita.  At that time had Crohn's colitis doing well on Humira  40 mg sq. 14 days, also discussed history of GERD controlled on Dexilant  60 mg daily.  She had internal hemorrhoids with occasional bleeding and he had recommended a colonoscopy in June 2025.   -- 07/07/2023 patient called in for refill of her Humira  pen and Dexilant .  This was refilled by Dr. Federico.  --GI office visit on 08/12/23 by Delon, PA for follow-up on Crohn's disease. Patient ran out of Humira  for 1 mth. She had few episodes of diarrhea. Pt enquired about Entyvio as recommended by another physician. No current issues with Humira , does state the needle seems to be bigger. Scheduled for colonoscopy with Dr. Nandigam for July.  Interval History    Patient presents for evaluation of three separate episodes of rectal bleeding on August 29 th, September 1st, and Sept 24th. She was having bowel movement and noted BRB in toilet, shows me pictures on phone of blood in toilet. She denies diarrhea or constipation at the time. Mild abdominal discomfort. No nausea or vomiting.  Patient has daily bowel movement.  Patient on the Humira  every other week. Has not missed dose. She was scheduled for colonoscopy with Dr. Shila in summer this was not done.     Patient has history of GERD that is still controlled on Dexilant  60 mg daily and Famotidine  40 mg twice daily. She  uses OTC gourmet reflux prn for occasional water brash. Patient denies dysphagia. Appetite good. Patient denies nausea, vomiting, or weight loss.  No changes to medications or surgeries in last 6 mths.  No hospitalizations in last 6 months.  Wt Readings from Last 3 Encounters:  02/22/24 176 lb (79.8 kg)  02/21/24 176 lb 9.6 oz (80.1 kg)  12/19/23 176 lb 9.6 oz (80.1 kg)    Past Medical History:  Diagnosis Date   Arthritis    Asthma    C. difficile colitis 2011 and 07/2010   Cervical dysplasia    Crohn disease (HCC)    Crohn's disease (HCC)    HSV infection    Hyperplastic polyp of intestine 09/2007   Osteopenia 11/2018   T score -2.0 FRAX 3.7% / 0.3%   Thyroid  disease    Urinary incontinence     Past Surgical History:  Procedure Laterality Date   BREAST CYST ASPIRATION Right    2000   BREAST EXCISIONAL BIOPSY Right    1980's   COLONOSCOPY  09/18/2013   COLPOSCOPY     FLEXIBLE SIGMOIDOSCOPY  03/29/2012   Procedure: FLEXIBLE SIGMOIDOSCOPY;  Surgeon: Gwendlyn ONEIDA Aneita, MD,FACG;  Location: [facility];  Service: Endoscopy;  Laterality: N/A;   GANGLION CYST EXCISION Left    POLYPECTOMY     TONSILLECTOMY     TOTAL ABDOMINAL HYSTERECTOMY  2000   TRIGGER FINGER RELEASE     WRIST SURGERY Left 2021    Current Outpatient Medications  Medication Sig  Dispense Refill   adalimumab  (HUMIRA , 2 PEN,) 40 MG/0.4ML pen Inject 0.4 mLs (40 mg total) into the skin every 14 (fourteen) days. 6 each 3   albuterol  (VENTOLIN  HFA) 108 (90 Base) MCG/ACT inhaler INHALE 2 PUFFS INTO THE LUNGS EVERY 4 HOURS AS NEEDED FOR SHORTNESS OF BREATH OR WHEEZING (MAXIMUM 12 PUFFS TOTAL PER 24 HOURS) 54 g 1   amitriptyline (ELAVIL) 10 MG tablet Take 10 mg by mouth at bedtime.     azelastine  (ASTELIN ) 0.1 % nasal spray SPRAY TWO SPRAYS INTO EACH NOSTRIL TWICE A DAY AS NEEDED FOR RHINITIS 90 mL 1   CALCIUM-MAGNESIUM -VITAMIN D  PO Take 2 tablets by mouth daily. With Zinc     cetirizine  (ZYRTEC ) 10 MG tablet Take  1 tablet (10 mg total) by mouth 2 (two) times daily as needed for allergies (Can take an extra dose if needed during flare ups.). 180 tablet 1   diphenhydrAMINE  (BENADRYL ) 25 MG tablet Take 1 tablet (25 mg total) by mouth every 6 (six) hours as needed. 20 tablet 0   EPINEPHrine  (EPIPEN  2-PAK) 0.3 mg/0.3 mL IJ SOAJ injection Inject 0.3 mg into the muscle as needed for anaphylaxis. 2 each 1   fluticasone  (FLONASE) 50 MCG/ACT nasal spray Place 2 sprays into both nostrils daily.     fluticasone -salmeterol (WIXELA INHUB) 250-50 MCG/ACT AEPB Inhale 1 puff into the lungs in the morning and at bedtime. 180 each 1   gabapentin  (NEURONTIN ) 300 MG capsule Take 1 capsule (300 mg total) by mouth at bedtime. 30 capsule 3   hydrocortisone  (ANUSOL -HC) 25 MG suppository Place 1 suppository (25 mg total) rectally at bedtime. 10 suppository 0   meloxicam (MOBIC) 7.5 MG tablet Take 7.5 mg by mouth daily.     montelukast  (SINGULAIR ) 10 MG tablet Take 1 tablet (10 mg total) by mouth at bedtime. 90 tablet 1   Na Sulfate-K Sulfate-Mg Sulfate concentrate (SUPREP) 17.5-3.13-1.6 GM/177ML SOLN Take 1 kit (354 mLs total) by mouth once for 1 dose. 354 mL 0   Tiotropium Bromide Monohydrate  (SPIRIVA  RESPIMAT) 2.5 MCG/ACT AERS Inhale 2 puffs into the lungs 1 day or 1 dose. 3 each 1   UNABLE TO FIND Med Name: reflux gourmet liquid pack     dexlansoprazole  (DEXILANT ) 60 MG capsule Take 1 capsule (60 mg total) by mouth daily. 90 capsule 3   famotidine  (PEPCID ) 40 MG tablet Take 1 tablet (40 mg total) by mouth 2 (two) times daily. 180 tablet 3   No current facility-administered medications for this visit.    Allergies as of 02/22/2024 - Review Complete 02/22/2024  Allergen Reaction Noted   Seasonal ic [cholestatin]  08/13/2015   Tramadol   11/21/2014   Amoxicillin  Itching and Rash 07/30/2014   Levofloxacin Rash 10/16/2019   Tramadol  hcl Rash 03/12/2021    Family History  Problem Relation Age of Onset   Diabetes Father     Diabetes Sister        x 2   Hypertension Sister        x 2   Diabetes Brother    Hypertension Brother    Colon cancer Neg Hx    Esophageal cancer Neg Hx    Pancreatic cancer Neg Hx    Rectal cancer Neg Hx    Stomach cancer Neg Hx    Colon polyps Neg Hx    Breast cancer Neg Hx     Review of Systems:    Constitutional: No weight loss, fever, chills, weakness or fatigue HEENT: Eyes: No change  in vision               Ears, Nose, Throat:  No change in hearing or congestion Skin: No rash or itching Cardiovascular: No chest pain, chest pressure or palpitations   Respiratory: No SOB or cough Gastrointestinal: See HPI and otherwise negative Genitourinary: No dysuria or change in urinary frequency Neurological: No headache, dizziness or syncope Musculoskeletal: No new muscle or joint pain Hematologic: No bleeding or bruising Psychiatric: No history of depression or anxiety    Physical Exam:  Vital signs: BP 130/80   Pulse 89   Ht 5' 4 (1.626 m)   Wt 176 lb (79.8 kg)   SpO2 97%   BMI 30.21 kg/m   Constitutional: Pleasant  female appears to be in NAD, Well developed, Well nourished, alert and cooperative Throat: Oral cavity and pharynx without inflammation, swelling or lesion.  Respiratory: Respirations even and unlabored. Lungs clear to auscultation bilaterally.   No wheezes, crackles, or rhonchi.  Cardiovascular: Normal S1, S2. Regular rate and rhythm. No peripheral edema, cyanosis or pallor.  Gastrointestinal:  Soft, nondistended, nontender. No rebound or guarding. Normal bowel sounds. No appreciable masses or hepatomegaly. Rectal:  Not performed. declined Anoscopy:declined Msk:  Symmetrical without gross deformities. Without edema, no deformity or joint abnormality.  Neurologic:  Alert and  oriented x4;  grossly normal neurologically.  Skin:   Dry and intact without significant lesions or rashes.  RELEVANT LABS AND IMAGING: CBC    Latest Ref Rng & Units 08/02/2023     3:15 PM 12/16/2022    5:08 PM 09/19/2022    3:34 PM  CBC  WBC 4.0 - 10.5 K/uL 3.4  4.5  5.1   Hemoglobin 12.0 - 15.0 g/dL 86.6  87.4  87.1   Hematocrit 36.0 - 46.0 % 39.8  37.0  37.3   Platelets 150.0 - 400.0 K/uL 303.0  294  298      CMP     Latest Ref Rng & Units 08/12/2023   11:15 AM 09/19/2022    3:34 PM 02/20/2022   11:54 AM  CMP  Glucose 70 - 99 mg/dL 93  879  82   BUN 6 - 23 mg/dL 11  12  12    Creatinine 0.40 - 1.20 mg/dL 9.32  9.22  9.13   Sodium 135 - 145 mEq/L 139  134  139   Potassium 3.5 - 5.1 mEq/L 4.1  3.7  5.0   Chloride 96 - 112 mEq/L 104  99  99   CO2 19 - 32 mEq/L 27  22  23    Calcium 8.4 - 10.5 mg/dL 9.4  9.3  9.9   Total Protein 6.0 - 8.3 g/dL 7.5  7.0  7.7   Total Bilirubin 0.2 - 1.2 mg/dL 0.4  1.3  0.7   Alkaline Phos 39 - 117 U/L 117  93  104   AST 0 - 37 U/L 18  26  22    ALT 0 - 35 U/L 25  22  28       Lab Results  Component Value Date   TSH 2.76 06/08/2018   12/15/23 MBSS Clinical Impression: Ms. Heatherington presents with functional and safe oropharyngeal swallow. Oral phase was c/b adequate lingual strength and coordination for directed bolus hold and a/p transit of boluses. Pharyngeal phase was c/b intact hyolaryngeal excursion, epiglottic inversion, and laryngeal vestibule closure which facilitated complete airway protection with no instances of penetration or aspiration. Trace pharyngeal residue noted, clears with spontaneous secondary swallow.  Reports of globus reported during assessment with esophageal sweep demonstrating statis in esophagus at this time with retrograde flow. Pt scheduled for esophagram following. Pending results, pt Ercell Razon benefit from dedicated work up via GI. Pt is suggested for ongoing regular solids and thin liquids diet, general aspiration precautions. Recommend esophageal precautions of upright for all intake, small bites with liquid wash, dietary modification, continue with reflux management regimen of PPI and alginate supplement.  12/15/23  ESOPHAGUS/BARIUM SWALLOW/TABLET STUDY  IMPRESSION: 1. Poor primary peristaltic wave with retropulsion of barium bolus to the mid and upper esophagus.   2.  Tertiary contractions present.   3. Small volume spontaneous gastroesophageal reflux to the lower esophagus.  02/06/24 CT chest WO contrast IMPRESSION: 1. Interval decrease in size of the more dominant right-sided pulmonary nodules seen previously. No new suspicious pulmonary nodule or mass. 2. Interval decrease in fine linear opacity seen previously in the subpleural/dependent lower lungs although some persistent chronic atelectasis or linear scarring is evident in both lung bases. 3. Stable lower lung predominant mild cylindrical bronchiectasis. 4. Cholelithiasis. 5.  Aortic Atherosclerosis (ICD10-I70.0)   Assessment: Encounter Diagnoses  Name Primary?   Crohn's disease of colon without complication (HCC) Yes   Internal hemorrhoids    Rectal bleeding    Gastroesophageal reflux disease, unspecified whether esophagitis present      68 year old female patient with history of Crohn's colitis and GERD.  Patient is currently on Humira  every other week and has been for several years.  Patient recently had intermittent bright red blood in toilet and with wiping after having bowel movement.  Last colonoscopy in June 2020 revealed internal hemorrhoids and with mild inflammation in the cecum and sigmoid colon to the descending colon.  Recommendations for colonoscopy repeat this June which was not completed.  Will go ahead and schedule in [facility] with Dr. Shila and can further evaluate rectal bleeding.  Will go ahead and give her Anusol  suppositories.  Will recheck her lab work and inflammatory marker today.  Due for TB Gold for biologic therapy.  If active disease on endoscopic procedures Jashawna Reever consider switching to another biologic.  Patient has inquired in the past about Entyvio.  Will discuss at follow-up appointment.    Patient has history of  GERD that is well-managed with Dexilant  and Pepcid  scheduled, refill prescriptions.  Patient also uses over-the-counter Gourmet reflux as needed. Denies dysphagia.  Plan: - Check CBC, CMP, CRP - Due >> TB gold for biologic therapy - Start Anusol  suppository at bedtime -continue dexilant  60 mg po daily, refilled -continue Pepcid  40 mg po daily, refilled -Continue Humira  every other week -Schedule for a colonoscopy in [facility] with Dr. Nandigam. The risks and benefits of colonoscopy with possible polypectomy / biopsies were discussed and the patient agrees to proceed.    Thank you for the courtesy of this consult. Please call me with any questions or concerns.   Kerrick Miler, FNP-C [facility] 02/22/2024, 10:45 AM  Cc: Haze Kingfisher, MD

## 2024-02-22 NOTE — Patient Instructions (Signed)
 Your provider has requested that you go to the basement level for lab work before leaving today. Press B on the elevator. The lab is located at the first door on the left as you exit the elevator.  We have sent the following medications to your pharmacy for you to pick up at your convenience: SUPREP  You have been scheduled for a colonoscopy. Please follow written instructions given to you at your visit today.   If you use inhalers (even only as needed), please bring them with you on the day of your procedure.  DO NOT TAKE 7 DAYS PRIOR TO TEST- Trulicity (dulaglutide) Ozempic, Wegovy (semaglutide) Mounjaro (tirzepatide) Bydureon Bcise (exanatide extended release)  DO NOT TAKE 1 DAY PRIOR TO YOUR TEST Rybelsus (semaglutide) Adlyxin (lixisenatide) Victoza (liraglutide) Byetta (exanatide) ___________________________________________________________________________  Due to recent changes in healthcare laws, you may see the results of your imaging and laboratory studies on MyChart before your provider has had a chance to review them.  We understand that in some cases there may be results that are confusing or concerning to you. Not all laboratory results come back in the same time frame and the provider may be waiting for multiple results in order to interpret others.  Please give us  48 hours in order for your provider to thoroughly review all the results before contacting the office for clarification of your results.   _______________________________________________________  If your blood pressure at your visit was 140/90 or greater, please contact your primary care physician to follow up on this.  _______________________________________________________  If you are age 68 or older, your body mass index should be between 23-30. Your Body mass index is 30.21 kg/m. If this is out of the aforementioned range listed, please consider follow up with your Primary Care Provider.  If you are age  68 or younger, your body mass index should be between 19-25. Your Body mass index is 30.21 kg/m. If this is out of the aformentioned range listed, please consider follow up with your Primary Care Provider.   ________________________________________________________  The [facility] providers would like to encourage you to use MYCHART to communicate with providers for non-urgent requests or questions.  Due to long hold times on the telephone, sending your provider a message by [facility] may be a faster and more efficient way to get a response.  Please allow 48 business hours for a response.  Please remember that this is for non-urgent requests.  _______________________________________________________  [facility] is using a team-based approach to care.  Your team is made up of your doctor and two to three APPS. Our APPS (Nurse Practitioners and Physician Assistants) work with your physician to ensure care continuity for you. They are fully qualified to address your health concerns and develop a treatment plan. They communicate directly with your gastroenterologist to care for you. Seeing the Advanced Practice Practitioners on your physician's team can help you by facilitating care more promptly, often allowing for earlier appointments, access to diagnostic testing, procedures, and other specialty referrals.   Thank you for trusting me with your gastrointestinal care. Deanna May, FNP-C

## 2024-02-23 ENCOUNTER — Encounter: Payer: Self-pay | Admitting: Allergy & Immunology

## 2024-02-23 MED ORDER — CARBINOXAMINE MALEATE 4 MG PO TABS: 4.0000 mg | ORAL_TABLET | Freq: Two times a day (BID) | ORAL | 1 refills | Status: AC

## 2024-02-25 LAB — QUANTIFERON-TB GOLD PLUS
Mitogen-NIL: >10 [IU]/mL
NIL: 0.02 [IU]/mL
QuantiFERON-TB Gold Plus: NEGATIVE
TB1-NIL: <0 [IU]/mL
TB2-NIL: <0 [IU]/mL

## 2024-03-02 ENCOUNTER — Ambulatory Visit (INDEPENDENT_AMBULATORY_CARE_PROVIDER_SITE_OTHER): Admitting: Otolaryngology

## 2024-03-06 ENCOUNTER — Ambulatory Visit: Admitting: Gastroenterology

## 2024-03-06 ENCOUNTER — Encounter: Payer: Self-pay | Admitting: Gastroenterology

## 2024-03-06 VITALS — BP 119/68 | HR 71 | Temp 98.1°F | Resp 14 | Ht 64.0 in | Wt 176.0 lb

## 2024-03-06 DIAGNOSIS — K5010 Crohn's disease of large intestine without complications: Secondary | ICD-10-CM | POA: Diagnosis not present

## 2024-03-06 DIAGNOSIS — D122 Benign neoplasm of ascending colon: Secondary | ICD-10-CM

## 2024-03-06 DIAGNOSIS — K6389 Other specified diseases of intestine: Secondary | ICD-10-CM | POA: Diagnosis not present

## 2024-03-06 DIAGNOSIS — Z1211 Encounter for screening for malignant neoplasm of colon: Secondary | ICD-10-CM

## 2024-03-06 DIAGNOSIS — D123 Benign neoplasm of transverse colon: Secondary | ICD-10-CM

## 2024-03-06 DIAGNOSIS — D128 Benign neoplasm of rectum: Secondary | ICD-10-CM

## 2024-03-06 DIAGNOSIS — K648 Other hemorrhoids: Secondary | ICD-10-CM

## 2024-03-06 DIAGNOSIS — D125 Benign neoplasm of sigmoid colon: Secondary | ICD-10-CM

## 2024-03-06 DIAGNOSIS — D120 Benign neoplasm of cecum: Secondary | ICD-10-CM

## 2024-03-06 DIAGNOSIS — K644 Residual hemorrhoidal skin tags: Secondary | ICD-10-CM

## 2024-03-06 DIAGNOSIS — K6289 Other specified diseases of anus and rectum: Secondary | ICD-10-CM | POA: Diagnosis not present

## 2024-03-06 DIAGNOSIS — K625 Hemorrhage of anus and rectum: Secondary | ICD-10-CM

## 2024-03-06 MED ORDER — HYDROCORTISONE (PERIANAL) 2.5 % EX CREA: 1.0000 | TOPICAL_CREAM | Freq: Two times a day (BID) | CUTANEOUS | 1 refills | Status: AC

## 2024-03-06 MED ORDER — HYDROCORTISONE (PERIANAL) 2.5 % EX CREA: 1.0000 | TOPICAL_CREAM | Freq: Two times a day (BID) | CUTANEOUS | 1 refills | Status: DC

## 2024-03-06 MED ORDER — SODIUM CHLORIDE 0.9 % IV SOLN: 500.0000 mL | Freq: Once | INTRAVENOUS | Status: DC

## 2024-03-06 NOTE — Op Note (Signed)
 [facility] Patient Name: Brianna Santana Procedure Date: 03/06/2024 11:28 AM MRN: [id number] Endoscopist: Gustav ALONSO Mcgee , MD, [id number] Age: 68 Referring MD:  Date of Birth: 1955-07-31 Gender: Female Account #: [id number] Procedure:                Colonoscopy Indications:              High risk colon cancer surveillance: Crohn's                            colitis of 8 (or more) years duration with                            one-third (or more) of the colon involved Medicines:                Monitored Anesthesia Care Procedure:                Pre-Anesthesia Assessment:                           - Prior to the procedure, a History and Physical                            was performed, and patient medications and                            allergies were reviewed. The patient's tolerance of                            previous anesthesia was also reviewed. The risks                            and benefits of the procedure and the sedation                            options and risks were discussed with the patient.                            All questions were answered, and informed consent                            was obtained. Prior Anticoagulants: The patient has                            taken no anticoagulant or antiplatelet agents. ASA                            Grade Assessment: II - A patient with mild systemic                            disease. After reviewing the risks and benefits,                            the patient was deemed in satisfactory condition to  undergo the procedure.                           After obtaining informed consent, the colonoscope                            was passed under direct vision. Throughout the                            procedure, the patient's blood pressure, pulse, and                            oxygen saturations were monitored continuously. The                            Olympus Scope [id number]  was introduced through the                            anus and advanced to the the cecum, identified by                            appendiceal orifice and ileocecal valve. The                            colonoscopy was performed without difficulty. The                            patient tolerated the procedure well. The quality                            of the bowel preparation was good. The ileocecal                            valve, appendiceal orifice, and rectum were                            photographed. Scope In: 11:41:42 AM Scope Out: 11:56:46 AM Scope Withdrawal Time: 0 hours 11 minutes 13 seconds  Total Procedure Duration: 0 hours 15 minutes 4 seconds  Findings:                 The perianal and digital rectal examinations were                            normal.                           The Simple Endoscopic Score for Crohn's Disease was                            determined based on the endoscopic appearance of                            the mucosa in the following segments:                           -  Ileum: Findings include no ulcers present, no                            ulcerated surfaces, no affected surfaces, no                            narrowings and no ulcers present, no ulcerated                            surfaces, no affected surfaces and no narrowings.                            Segment score: 0.                           - Right Colon: Findings include no ulcers present,                            no ulcerated surfaces, no affected surfaces and no                            narrowings. Segment score: 0.                           - Transverse Colon: Findings include no ulcers                            present, no ulcerated surfaces, no affected                            surfaces and no narrowings. Segment score: 0.                           - Left Colon: Findings include no ulcers present,                            no ulcerated surfaces, less than 50% of  surfaces                            affected and no narrowings. Segment score: 1.                           - Rectum: Findings include no ulcers present, no                            ulcerated surfaces, no affected surfaces and no                            narrowings. Segment score: 0.                           - Total SES-CD aggregate score: 1. Biopsies were  taken with a cold forceps for histology.                           Non-bleeding external and internal hemorrhoids were                            found during retroflexion. The hemorrhoids were                            medium-sized. Complications:            No immediate complications. Estimated Blood Loss:     Estimated blood loss was minimal. Impression:               - Simple Endoscopic Score for Crohn's Disease: 1,                            mucosal inflammatory changes secondary to Crohn's                            disease, in remission. Biopsied.                           - Non-bleeding external and internal hemorrhoids. Recommendation:           - Patient has a contact number available for                            emergencies. The signs and symptoms of potential                            delayed complications were discussed with the                            patient. Return to normal activities tomorrow.                            Written discharge instructions were provided to the                            patient.                           - Resume previous diet.                           - Continue present medications.                           - Await pathology results.                           - Repeat colonoscopy in 5 years for surveillance                            based on pathology results.                           -  Return to GI clinic in 6 months.                           - Use Analpram HC Cream 2.5%: Apply externally BID                            PRN. [name],  MD 03/06/2024 12:04:06 PM This report has been signed electronically.

## 2024-03-06 NOTE — Progress Notes (Unsigned)
 Called to room to assist during endoscopic procedure.  Patient ID and intended procedure confirmed with present staff. Received instructions for my participation in the procedure from the performing physician.

## 2024-03-06 NOTE — Progress Notes (Unsigned)
 Pt's states no medical or surgical changes since previsit or office visit.

## 2024-03-06 NOTE — Progress Notes (Unsigned)
 [facility] History and Physical   Primary Care Physician:  Haze Kingfisher, MD   Reason for Procedure:  Crohn's disease with rectal bleeding  Plan:    colonoscopy with possible interventions as needed     HPI: Brianna Santana is a very pleasant 68 y.o. female here for colonoscopy for Crohn's colitis with rectal bleeding, to assess disease activity.   The risks and benefits as well as alternatives of endoscopic procedure(s) have been discussed and reviewed. All questions answered. The patient agrees to proceed.    Past Medical History:  Diagnosis Date   Arthritis    Asthma    C. difficile colitis 2011 and 07/2010   Cervical dysplasia    Crohn disease (HCC)    Crohn's disease (HCC)    HSV infection    Hyperplastic polyp of intestine 09/2007   Osteopenia 11/2018   T score -2.0 FRAX 3.7% / 0.3%   Thyroid  disease    Urinary incontinence     Past Surgical History:  Procedure Laterality Date   BREAST CYST ASPIRATION Right    2000   BREAST EXCISIONAL BIOPSY Right    1980's   COLONOSCOPY  09/18/2013   COLPOSCOPY     FLEXIBLE SIGMOIDOSCOPY  03/29/2012   Procedure: FLEXIBLE SIGMOIDOSCOPY;  Surgeon: Gwendlyn ONEIDA Buddy, MD,FACG;  Location: [facility];  Service: Endoscopy;  Laterality: N/A;   GANGLION CYST EXCISION Left    POLYPECTOMY     TONSILLECTOMY     TOTAL ABDOMINAL HYSTERECTOMY  2000   TRIGGER FINGER RELEASE     WRIST SURGERY Left 2021    Prior to Admission medications   Medication Sig Start Date End Date Taking? Authorizing Provider  adalimumab  (HUMIRA , 2 PEN,) 40 MG/0.4ML pen Inject 0.4 mLs (40 mg total) into the skin every 14 (fourteen) days. 08/12/23  Yes Lemmon, Delon Gibson, PA  fluticasone  [facility]) 50 MCG/ACT nasal spray Place 2 sprays into both nostrils daily.   Yes [provider]  gabapentin  (NEURONTIN ) 300 MG capsule Take 1 capsule (300 mg total) by mouth at bedtime. 12/19/23  Yes Hope Almarie ORN, NP  Tiotropium Bromide  Monohydrate (SPIRIVA  RESPIMAT) 2.5 MCG/ACT AERS Inhale 2 puffs into the lungs 1 day or 1 dose. 02/21/24  Yes Iva Marty Saltness, MD  UNABLE TO FIND Med Name: reflux gourmet liquid pack   Yes [provider]  albuterol  (VENTOLIN  HFA) 108 (90 Base) MCG/ACT inhaler INHALE 2 PUFFS INTO THE LUNGS EVERY 4 HOURS AS NEEDED FOR SHORTNESS OF BREATH OR WHEEZING (MAXIMUM 12 PUFFS TOTAL PER 24 HOURS) 07/06/22   Iva Marty Saltness, MD  amitriptyline (ELAVIL) 10 MG tablet Take 10 mg by mouth at bedtime. Patient not taking: Reported on 03/06/2024    [provider]  azelastine  (ASTELIN ) 0.1 % nasal spray SPRAY TWO SPRAYS INTO EACH NOSTRIL TWICE A DAY AS NEEDED FOR RHINITIS 09/20/23   Iva Marty Saltness, MD  CALCIUM-MAGNESIUM -VITAMIN D  PO Take 2 tablets by mouth daily. With Zinc    [provider]  Carbinoxamine  Maleate 4 MG TABS Take 1 tablet (4 mg total) by mouth in the morning and at bedtime. 02/23/24   Iva Marty Saltness, MD  cetirizine  (ZYRTEC ) 10 MG tablet Take 1 tablet (10 mg total) by mouth 2 (two) times daily as needed for allergies (Can take an extra dose if needed during flare ups.). 09/20/23   Iva Marty Saltness, MD  dexlansoprazole  (DEXILANT ) 60 MG capsule Take 1 capsule (60 mg total) by mouth daily. 02/22/24   May, Deanna  J, NP  diphenhydrAMINE  (BENADRYL ) 25 MG tablet Take 1 tablet (25 mg total) by mouth every 6 (six) hours as needed. 10/16/19   Donah Riis A, PA-C  EPINEPHrine  (EPIPEN  2-PAK) 0.3 mg/0.3 mL IJ SOAJ injection Inject 0.3 mg into the muscle as needed for anaphylaxis. 12/22/23   Jeneal Danita Macintosh, MD  famotidine  (PEPCID ) 40 MG tablet Take 1 tablet (40 mg total) by mouth 2 (two) times daily. 02/22/24   May, Deanna J, NP  fluticasone -salmeterol (WIXELA INHUB) 250-50 MCG/ACT AEPB Inhale 1 puff into the lungs in the morning and at bedtime. 09/20/23   Iva Marty Saltness, MD  hydrocortisone  (ANUSOL -HC) 25 MG suppository Place 1 suppository (25 mg total)  rectally at bedtime. 02/22/24   May, Deanna J, NP  meloxicam (MOBIC) 7.5 MG tablet Take 7.5 mg by mouth daily.    [provider]  montelukast  (SINGULAIR ) 10 MG tablet Take 1 tablet (10 mg total) by mouth at bedtime. 09/20/23   Iva Marty Saltness, MD    Current Outpatient Medications  Medication Sig Dispense Refill   adalimumab  (HUMIRA , 2 PEN,) 40 MG/0.4ML pen Inject 0.4 mLs (40 mg total) into the skin every 14 (fourteen) days. 6 each 3   fluticasone  (FLONASE) 50 MCG/ACT nasal spray Place 2 sprays into both nostrils daily.     gabapentin  (NEURONTIN ) 300 MG capsule Take 1 capsule (300 mg total) by mouth at bedtime. 30 capsule 3   Tiotropium Bromide Monohydrate  (SPIRIVA  RESPIMAT) 2.5 MCG/ACT AERS Inhale 2 puffs into the lungs 1 day or 1 dose. 3 each 1   UNABLE TO FIND Med Name: reflux gourmet liquid pack     albuterol  (VENTOLIN  HFA) 108 (90 Base) MCG/ACT inhaler INHALE 2 PUFFS INTO THE LUNGS EVERY 4 HOURS AS NEEDED FOR SHORTNESS OF BREATH OR WHEEZING (MAXIMUM 12 PUFFS TOTAL PER 24 HOURS) 54 g 1   amitriptyline (ELAVIL) 10 MG tablet Take 10 mg by mouth at bedtime. (Patient not taking: Reported on 03/06/2024)     azelastine  (ASTELIN ) 0.1 % nasal spray SPRAY TWO SPRAYS INTO EACH NOSTRIL TWICE A DAY AS NEEDED FOR RHINITIS 90 mL 1   CALCIUM-MAGNESIUM -VITAMIN D  PO Take 2 tablets by mouth daily. With Zinc     Carbinoxamine  Maleate 4 MG TABS Take 1 tablet (4 mg total) by mouth in the morning and at bedtime. 180 tablet 1   cetirizine  (ZYRTEC ) 10 MG tablet Take 1 tablet (10 mg total) by mouth 2 (two) times daily as needed for allergies (Can take an extra dose if needed during flare ups.). 180 tablet 1   dexlansoprazole  (DEXILANT ) 60 MG capsule Take 1 capsule (60 mg total) by mouth daily. 90 capsule 3   diphenhydrAMINE  (BENADRYL ) 25 MG tablet Take 1 tablet (25 mg total) by mouth every 6 (six) hours as needed. 20 tablet 0   EPINEPHrine  (EPIPEN  2-PAK) 0.3 mg/0.3 mL IJ SOAJ injection Inject 0.3 mg into  the muscle as needed for anaphylaxis. 2 each 1   famotidine  (PEPCID ) 40 MG tablet Take 1 tablet (40 mg total) by mouth 2 (two) times daily. 180 tablet 3   fluticasone -salmeterol (WIXELA INHUB) 250-50 MCG/ACT AEPB Inhale 1 puff into the lungs in the morning and at bedtime. 180 each 1   hydrocortisone  (ANUSOL -HC) 25 MG suppository Place 1 suppository (25 mg total) rectally at bedtime. 10 suppository 0   meloxicam (MOBIC) 7.5 MG tablet Take 7.5 mg by mouth daily.     montelukast  (SINGULAIR ) 10 MG tablet Take 1 tablet (10 mg total) by  mouth at bedtime. 90 tablet 1   Current Facility-Administered Medications  Medication Dose Route Frequency Provider Last Rate Last Admin   0.9 %  sodium chloride  infusion  500 mL Intravenous Once Deborra Phegley V, MD        Allergies as of 03/06/2024 - Review Complete 03/06/2024  Allergen Reaction Noted   Amoxicillin  Itching and Rash 07/30/2014   Levofloxacin Rash 10/16/2019   Seasonal ic [cholestatin] Other (See Comments) 08/13/2015   Tramadol  Other (See Comments) 11/21/2014   Tramadol  hcl Rash 03/12/2021    Family History  Problem Relation Age of Onset   Diabetes Father    Diabetes Sister        x 2   Hypertension Sister        x 2   Diabetes Brother    Hypertension Brother    Colon cancer Neg Hx    Esophageal cancer Neg Hx    Pancreatic cancer Neg Hx    Rectal cancer Neg Hx    Stomach cancer Neg Hx    Colon polyps Neg Hx    Breast cancer Neg Hx     Social History   Socioeconomic History   Marital status: Married    Spouse name: Not on file   Number of children: 3   Years of education: Not on file   Highest education level: Not on file  Occupational History   Occupation: Retired    Associate Professor: UNEMPLOYED  Tobacco Use   Smoking status: Former    Current packs/day: 0.00    Types: Cigarettes    Start date: 09/08/1978    Quit date: 09/07/1988    Years since quitting: 35.5   Smokeless tobacco: Never  Vaping Use   Vaping status: Never  Used  Substance and Sexual Activity   Alcohol use: No    Alcohol/week: 0.0 standard drinks of alcohol   Drug use: No   Sexual activity: Yes    Partners: Male    Birth control/protection: Surgical    Comment: hysterectomy, younger than, less than 5  Other Topics Concern   Not on file  Social History Narrative   Not on file   Social Drivers of Health   Financial Resource Strain: Not on file  Food Insecurity: Not on file  Transportation Needs: Not on file  Physical Activity: Not on file  Stress: Not on file  Social Connections: Unknown (12/01/2022)   [facility]   Social Network    Social Network: Not on file  Intimate Partner Violence: Unknown (12/01/2022)   Received from [facility]   HITS    Physically Hurt: Not on file    Insult or Talk Down To: Not on file    Threaten Physical Harm: Not on file    Scream or Curse: Not on file    Review of Systems:  All other review of systems negative except as mentioned in the HPI.  Physical Exam: Vital signs in last 24 hours: BP 134/75   Pulse 67   Temp 98.1 F (36.7 C) (Temporal)   Ht 5' 4 (1.626 m)   Wt 176 lb (79.8 kg)   SpO2 97%   BMI 30.21 kg/m  General:   Alert, NAD Lungs:  Clear .   Heart:  Regular rate and rhythm Abdomen:  Soft, nontender and nondistended. Neuro/Psych:  Alert and cooperative. Normal mood and affect. A and O x 3  Reviewed labs, radiology imaging, old records and pertinent past GI work up  Patient is  appropriate for planned procedure(s) and anesthesia in an ambulatory setting   K. Veena Dhanvi Boesen , MD [phone]

## 2024-03-06 NOTE — Patient Instructions (Signed)
 YOU HAD AN ENDOSCOPIC PROCEDURE TODAY AT THE [facility]:   Refer to the procedure report that was given to you for any specific questions about what was found during the examination.  If the procedure report does not answer your questions, please call your gastroenterologist to clarify.  If you requested that your care partner not be given the details of your procedure findings, then the procedure report has been included in a sealed envelope for you to review at your convenience later.  YOU SHOULD EXPECT: Some feelings of bloating in the abdomen. Passage of more gas than usual.  Walking can help get rid of the air that was put into your GI tract during the procedure and reduce the bloating. If you had a lower endoscopy (such as a colonoscopy or flexible sigmoidoscopy) you may notice spotting of blood in your stool or on the toilet paper. If you underwent a bowel prep for your procedure, you may not have a normal bowel movement for a few days.  Please Note:  You might notice some irritation and congestion in your nose or some drainage.  This is from the oxygen used during your procedure.  There is no need for concern and it should clear up in a day or so.  SYMPTOMS TO REPORT IMMEDIATELY:  Following lower endoscopy (colonoscopy or flexible sigmoidoscopy):  Excessive amounts of blood in the stool  Significant tenderness or worsening of abdominal pains  Swelling of the abdomen that is new, acute  Fever of 100F or higher  Resume previous diet Continue present medications Await pathology esults Repeat colonoscopy in 5 years Return to GI clinic in 6 months Use Analpram HC Cream 2.5% twice daily as needed   For urgent or emergent issues, a gastroenterologist can be reached at any hour by calling [phone]. Do not use MyChart messaging for urgent concerns.    DIET:  We do recommend a small meal at first, but then you may proceed to your regular diet.  Drink plenty of fluids but  you should avoid alcoholic beverages for 24 hours.  ACTIVITY:  You should plan to take it easy for the rest of today and you should NOT DRIVE or use heavy machinery until tomorrow (because of the sedation medicines used during the test).    FOLLOW UP: Our staff will call the number listed on your records the next business day following your procedure.  We will call around 7:15- 8:00 am to check on you and address any questions or concerns that you may have regarding the information given to you following your procedure. If we do not reach you, we will leave a message.     If any biopsies were taken you will be contacted by phone or by letter within the next 1-3 weeks.  Please call us  at [phone] if you have not heard about the biopsies in 3 weeks.    SIGNATURES/CONFIDENTIALITY: You and/or your care partner have signed paperwork which will be entered into your electronic medical record.  These signatures attest to the fact that that the information above on your After Visit Summary has been reviewed and is understood.  Full responsibility of the confidentiality of this discharge information lies with you and/or your care-partner.

## 2024-03-06 NOTE — Progress Notes (Unsigned)
 Report to PACU, RN, vss, BBS= Clear.

## 2024-03-07 ENCOUNTER — Telehealth: Payer: Self-pay

## 2024-03-07 NOTE — Telephone Encounter (Signed)
  Follow up Call-     03/06/2024   10:51 AM  Call back number  Post procedure Call Back phone  # [phone]  Permission to leave phone message Yes     Patient questions:  Do you have a fever, pain , or abdominal swelling? No. Pain Score  0 *  Have you tolerated food without any problems? Yes.    Have you been able to return to your normal activities? Yes.    Do you have any questions about your discharge instructions: Diet   No. Medications  No. Follow up visit  No.  Do you have questions or concerns about your Care? No.  Actions: * If pain score is 4 or above: No action needed, pain <4.

## 2024-03-08 LAB — SURGICAL PATHOLOGY

## 2024-03-09 ENCOUNTER — Ambulatory Visit: Payer: Self-pay | Admitting: Gastroenterology

## 2024-03-12 ENCOUNTER — Ambulatory Visit (INDEPENDENT_AMBULATORY_CARE_PROVIDER_SITE_OTHER): Admitting: Otolaryngology

## 2024-03-12 ENCOUNTER — Encounter (INDEPENDENT_AMBULATORY_CARE_PROVIDER_SITE_OTHER): Payer: Self-pay | Admitting: Otolaryngology

## 2024-03-12 VITALS — BP 129/73 | HR 85 | Temp 98.4°F

## 2024-03-12 DIAGNOSIS — R0982 Postnasal drip: Secondary | ICD-10-CM

## 2024-03-12 DIAGNOSIS — R490 Dysphonia: Secondary | ICD-10-CM | POA: Diagnosis not present

## 2024-03-12 DIAGNOSIS — K219 Gastro-esophageal reflux disease without esophagitis: Secondary | ICD-10-CM

## 2024-03-12 DIAGNOSIS — J3089 Other allergic rhinitis: Secondary | ICD-10-CM

## 2024-03-12 DIAGNOSIS — J383 Other diseases of vocal cords: Secondary | ICD-10-CM

## 2024-03-12 NOTE — Patient Instructions (Signed)

## 2024-03-12 NOTE — Progress Notes (Signed)
 ENT Progress Note:   Update 03/12/2024  Discussed the use of AI scribe software for clinical note transcription with the patient, who gave verbal consent to proceed.  History of Present Illness Brianna Santana is a 68 year old female with reflux and voice issues who presents for follow-up on voice therapy and swallowing difficulties.  She has been practicing voice therapy at home and finds it beneficial. Her husband has commented on her voice, noting that she has 'talked funny' since he has known her, which she attributes to her upbringing in [place] . Her voice is particularly raspy in the mornings, which she associates with weather changes. She has been practicing techniques from voice therapy to manage this.  She underwent a swallow study, and esophagram showed decreased peristalsis and evidence of reflux. MBS was normal. She reports improvement in her swallowing and a reduction in coughing, attributing this to the exercises and techniques learned in speech therapy.  She recently visited an allergy  and asthma specialist, Brianna. Iva, and a specialist for Crohn's disease. She is currently taking Dexilant  and famotidine  for reflux.  Records Reviewed:  Initial Evaluation  Reason for Consult: dysphonia and dysphagia  chronic cough   HPI: Discussed the use of AI scribe software for clinical note transcription with the patient, who gave verbal consent to proceed.  History of Present Illness Brianna Santana is a 68 year old female with asthma who presents with chronic cough and changes in her voice. She was referred by her pulmonary doctor for further evaluation.  She has been experiencing a persistent cough that worsened this year, particularly during the pollen season. The cough began last year while she was in [place]  and has become more severe with the presence of airborne particles this year. It occurs throughout the day and night, with the worst episodes early in the morning  and sometimes during the night. She has a history of asthma and has recently started seeing a pulmonary doctor.  She experiences changes in her voice, noting variability and hoarseness that can occur quickly. She has been working with a Human resources officer on breathing techniques to manage her symptoms. Despite these efforts, she continues to experience issues with her voice, particularly in the mornings.  She experiences difficulty swallowing, particularly when phlegm from her nose gets stuck in her throat, leading to choking episodes. Solid foods and even candy can sometimes go down the wrong way, causing prolonged coughing. She has not had a swallow test previously.  She has a history of heartburn or reflux and is currently taking Dexilant  and famotidine  for management. No masses or tumors in her throat.     Records Reviewed:  Pulm office visit Brianna Santana 10/07/23 Brianna Santana 68 y.o. -new consult for chronic cough.  She tells me that at baseline she is has a history of seasonal allergies that gets me all the time and this has been on since her child.  But most pronounced is that in 2022/03/29 she suffered from COVID-19 for which she took Paxlovid  treated as an outpatient.  Since then she has got severe chronic cough.  RSI cough score is below.  It is rated as moderate right now.  Since January she has been on Trelegy which has helped somewhat but she says she wakes up in the middle of the night cough is made worse by drinking milk or eating pork.  There is no clear-cut relieving factors.  There is chronic sinus drainage associated with it there is some  wheezing associated with it there is also sputum of clear color and also occasionally yellow.  There are some mild associated shortness of breath.  Her exam nitric oxide  test today is 9 ppb..  This is normal.  She is on Trelegy.  There is no fever.  She says the cough wakes her up in the night.  Is also present in the daytime.   She does have acid  reflux and is on Pepcid  and PPI She does not have hypertension - She does have chronic sinus drainage. She is not on ACE inhibitor's.      Past Medical History:  Diagnosis Date   Arthritis    Asthma    C. difficile colitis 2011 and 07/2010   Cervical dysplasia    Crohn disease (HCC)    Crohn's disease (HCC)    HSV infection    Hyperplastic polyp of intestine 09/2007   Osteopenia 11/2018   T score -2.0 FRAX 3.7% / 0.3%   Thyroid  disease    Urinary incontinence     Past Surgical History:  Procedure Laterality Date   BREAST CYST ASPIRATION Right    2000   BREAST EXCISIONAL BIOPSY Right    1980's   COLONOSCOPY  09/18/2013   COLPOSCOPY     FLEXIBLE SIGMOIDOSCOPY  03/29/2012   Procedure: FLEXIBLE SIGMOIDOSCOPY;  Surgeon: Gwendlyn Brianna Buddy, MD,FACG;  Location: [facility];  Service: Endoscopy;  Laterality: N/A;   GANGLION CYST EXCISION Left    POLYPECTOMY     TONSILLECTOMY     TOTAL ABDOMINAL HYSTERECTOMY  2000   TRIGGER FINGER RELEASE     WRIST SURGERY Left 2021    Family History  Problem Relation Age of Onset   Diabetes Father    Diabetes Sister        x 2   Hypertension Sister        x 2   Diabetes Brother    Hypertension Brother    Colon cancer Neg Hx    Esophageal cancer Neg Hx    Pancreatic cancer Neg Hx    Rectal cancer Neg Hx    Stomach cancer Neg Hx    Colon polyps Neg Hx    Breast cancer Neg Hx     Social History:  reports that she quit smoking about 35 years ago. Her smoking use included cigarettes. She started smoking about 45 years ago. She has never used smokeless tobacco. She reports that she does not drink alcohol and does not use drugs.  Allergies:  Allergies  Allergen Reactions   Amoxicillin  Itching and Rash   Levofloxacin Rash   Seasonal Ic [Cholestatin] Other (See Comments)    Pt does not remember reaction to medicaine   Tramadol  Other (See Comments)    Major Headaches   Tramadol  Hcl Rash    Medications: I have reviewed the patient's  current medications.  The PMH, PSH, Medications, Allergies, and SH were reviewed and updated.  ROS: Constitutional: Negative for fever, weight loss and weight gain. Cardiovascular: Negative for chest pain and dyspnea on exertion. Respiratory: Is not experiencing shortness of breath at rest. Gastrointestinal: Negative for nausea and vomiting. Neurological: Negative for headaches. Psychiatric: The patient is not nervous/anxious  Blood pressure 129/73, pulse 85, temperature 98.4 F (36.9 C), SpO2 96%. There is no height or weight on file to calculate BMI.  PHYSICAL EXAM:  Exam: General: Well-developed, well-nourished Communication and Voice: raspy Respiratory Respiratory effort: Equal inspiration and expiration without stridor Cardiovascular Peripheral Vascular: Warm extremities with equal  color/perfusion Eyes: No nystagmus with equal extraocular motion bilaterally Neuro/Psych/Balance: Patient oriented to person, place, and time; Appropriate mood and affect; Gait is intact with no imbalance; Cranial nerves I-XII are intact Head and Face Inspection: Normocephalic and atraumatic without mass or lesion Palpation: Facial skeleton intact without bony stepoffs Salivary Glands: No mass or tenderness Facial Strength: Facial motility symmetric and full bilaterally ENT Pinna: External ear intact and fully developed External canal: Canal is patent with intact skin Tympanic Membrane: Clear and mobile External Nose: No scar or anatomic deformity Internal Nose: Septum is deviated to the left. No polyp, or purulence. Mucosal edema and erythema present.  Bilateral inferior turbinate hypertrophy.  Lips, Teeth, and gums: Mucosa and teeth intact and viable TMJ: No pain to palpation with full mobility Oral cavity/oropharynx: No erythema or exudate, no lesions present Neck Neck and Trachea: Midline trachea without mass or lesion Thyroid : No mass or nodularity Lymphatics: No  lymphadenopathy  Studies Reviewed: 09/28/23 CXR - normal   CT chest 08/15/23 1. Mild patchy ground-glass opacity and reticulation throughout both lungs with associated mild cylindrical bronchiectasis, most prominent in the lower lobes. Findings are compatible with a mild postinflammatory fibrosis pattern due to reported history of COVID-19 infection. 2. Several solid scattered pulmonary nodules in the right greater than left lungs, largest 0.7 cm in the medial right upper lobe. Non-contrast chest CT at 3-6 months is recommended. If the nodules are stable at time of repeat CT, then future CT at 18-24 months (from today's scan) is considered optional for low-risk patients, but is recommended for high-risk patients. This recommendation follows the consensus statement: Guidelines for Management of Incidental Pulmonary Nodules Detected on CT Images: From the Fleischner Society 2017; Radiology 2017; 284:228-243. 3. Right thyroid  4.2 cm heterogeneous hypodense nodule with associated left tracheal deviation and mild tracheal narrowing. Recommend thyroid  US .  Thyroid  U/S 10/20/23 1. Continued stability of previously biopsied nodule in the right lower gland. 2. Diffusely heterogeneous and mildly enlarged background thyroid  gland with increased vascularity on color Doppler imaging. 3. No new nodules.  CT max face 08/15/23 IMPRESSION: 1. Hyperplastic paranasal sinuses with no significant paranasal sinus mucosal thickening or opacification. Patent sinus drainage pathways. 2. Symmetric nasal cavity mucosal thickening raising the possibility of rhinitis. 3. 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  MBS Esophagram 12/15/23 Clinical Impression: Ms. Bachmann presents with functional and safe oropharyngeal swallow. Oral phase was c/b adequate lingual strength and coordination for directed bolus hold and a/p transit of boluses.  Pharyngeal phase was c/b intact hyolaryngeal excursion, epiglottic inversion, and laryngeal vestibule closure which facilitated complete airway protection with no instances of penetration or aspiration. Trace pharyngeal residue noted, clears with spontaneous secondary swallow. Reports of globus reported during assessment with esophageal sweep demonstrating statis in esophagus at this time with retrograde flow. Pt scheduled for esophagram following. Pending results, pt may benefit from dedicated work up via GI. Pt is suggested for ongoing regular solids and thin liquids diet, general aspiration precautions. Recommend esophageal precautions of upright for all intake, small bites with liquid wash, dietary modification, continue with reflux management regimen of PPI and alginate supplement.   IMPRESSION: 1. Poor primary peristaltic wave with retropulsion of barium bolus to the mid and upper esophagus.  2.  Tertiary contractions present. 3. Small volume spontaneous gastroesophageal reflux to the lower esophagus.  Assessment/Plan: Encounter Diagnoses  Name Primary?   Dysphonia Yes   Glottic insufficiency    Age-related vocal fold atrophy  Hoarseness    Post-nasal drip    Chronic GERD    Environmental and seasonal allergies    Assessment and Plan Assessment & Plan Dysphonia  Flexible scope exam without masses lesions or VF paralysis, but with evidence of age-related VF atrophy.  - Refer to voice therapy for breath support to improve voice projection.  Chronic cough  Chronic cough in the setting of known environmental allergies and pulmonary changes of ground-glass opacities and bronchiolitis on CT chest a 08/15/23. - Continue management with pulmonary specialist. - Continue allergy  management with allergist, including nasal spray and allergy  shots.  Gastroesophageal reflux disease (GERD) GERD with heartburn and reflux symptoms. Reflux-related changes observed. On Dexilant  and famotidine . -  Continue Dexilant  and famotidine . - Recommend sweet paste Reflux Gourmet after meals. - Provided lifestyle changes information in after visit summary.  Update 03/12/24 Presbyphonia Chronic dysphonia improved with voice therapy. Persistent morning raspiness possibly due to reflux. - Continue voice therapy exercises at home. - return if she develops worsening of hoarseness, will consider other interventions in the future  Gastroesophageal reflux disease with decreased esophageal peristalsis Swallow study showed decreased esophageal peristalsis and reflux. Current medications effective. - Continue current medications: Dexilant  and famotidine . - diet and lifestyle changes    Elena Larry, MD Otolaryngology [facility] Phone: [phone] Fax: [phone]    03/12/2024, 12:47 PM

## 2024-03-16 ENCOUNTER — Ambulatory Visit (INDEPENDENT_AMBULATORY_CARE_PROVIDER_SITE_OTHER)

## 2024-03-16 DIAGNOSIS — J309 Allergic rhinitis, unspecified: Secondary | ICD-10-CM

## 2024-03-19 ENCOUNTER — Ambulatory Visit (INDEPENDENT_AMBULATORY_CARE_PROVIDER_SITE_OTHER)

## 2024-03-19 DIAGNOSIS — J309 Allergic rhinitis, unspecified: Secondary | ICD-10-CM | POA: Diagnosis not present

## 2024-03-19 MED ORDER — MONTELUKAST SODIUM 10 MG PO TABS
10.0000 mg | ORAL_TABLET | Freq: Every day | ORAL | 1 refills | Status: AC
Start: 2024-03-19 — End: ?

## 2024-03-20 ENCOUNTER — Ambulatory Visit: Admitting: Internal Medicine

## 2024-03-20 ENCOUNTER — Encounter: Payer: Self-pay | Admitting: Internal Medicine

## 2024-03-20 VITALS — BP 118/72 | HR 94 | Temp 98.5°F | Ht 64.0 in | Wt 174.4 lb

## 2024-03-20 DIAGNOSIS — U099 Post covid-19 condition, unspecified: Secondary | ICD-10-CM

## 2024-03-20 DIAGNOSIS — R053 Chronic cough: Secondary | ICD-10-CM | POA: Diagnosis not present

## 2024-03-20 DIAGNOSIS — Z7185 Encounter for immunization safety counseling: Secondary | ICD-10-CM

## 2024-03-20 DIAGNOSIS — Z23 Encounter for immunization: Secondary | ICD-10-CM

## 2024-03-20 DIAGNOSIS — R918 Other nonspecific abnormal finding of lung field: Secondary | ICD-10-CM

## 2024-03-20 NOTE — Patient Instructions (Addendum)
 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  Plan - Reduce gabapentin  to 100 mg nightly on Monday Wednesday and Friday for 1 week -> then take it only on Monday and Friday for 1 week -> then take it on Monday alone for 1 week and stop - If cough gets worse then please let us  know and we have to restart the gabapentin  - Follow all the advised by ENT and voice rehabilitation.  - continue other mediicines  #Post COVID lung nodules and interstitial changes  -These are stable/improved on CT scan in the fall 2025  #vaccine counseling  Plan  -high dose flu shot 03/20/2024 - *You are eligible for Prevnar 20 and you can either have it today or at another time on your own  Follow-up - 6 months with nurse practitioner; RSI cough score at follow-up

## 2024-03-20 NOTE — Addendum Note (Signed)
 Addended by: Caelan Branden M on: 03/20/2024 02:52 PM   Modules accepted: Orders

## 2024-03-20 NOTE — Progress Notes (Signed)
 OV 08/02/2023  Subjective:  Patient ID: Brianna Santana, female , DOB: 10-Mar-1956 , age 68 y.o. , MRN: [id number] , ADDRESS: [address] [place] [address] PCP Brianna Harvey, MD Patient Care Team: Brianna Harvey, MD as PCP - General (Family Medicine)  This Provider for this visit: Treatment Team:  Attending Provider: Geronimo Amel, MD    08/02/2023 -   Chief Complaint  Patient presents with   Pulmonary Consult    Self referral. Pt c/o cough since Covid 19 dx Sept 2023. Cough is prod with clear to light yellow sputum. She states she was dxed with Asthma years ago. She has DOE if she moves too quickly.      HPI Brianna Santana 68 y.o. -new consult for chronic cough.  She tells me that at baseline she is has a history of seasonal allergies that gets me all the time and this has Santana on since her child.  But most pronounced is that in 30-Mar-2022 she suffered from COVID-19 for which she took Paxlovid  treated as an outpatient.  Since then she has got severe chronic cough.  RSI cough score is below.  It is rated as moderate right now.  Since January she has Santana on Trelegy which has helped somewhat but she says she wakes up in the middle of the night cough is made worse by drinking milk or eating pork.  There is no clear-cut relieving factors.  There is chronic sinus drainage associated with it there is some wheezing associated with it there is also sputum of clear color and also occasionally yellow.  There are some mild associated shortness of breath.  Her exam nitric oxide  test today is 9 ppb..  This is normal.  She is on Trelegy.  There is no fever.  She says the cough wakes her up in the night.  Is also present in the daytime.  She does have acid reflux and is on Pepcid  and PPI She does not have hypertension - She does have chronic sinus drainage. She is not on ACE inhibitor's.   OV 10/07/2023  Subjective:  Patient ID: Brianna Santana, female , DOB:  1956-05-13 , age 68 y.o. , MRN: [id number] , ADDRESS: [address] [place] [address] PCP Brianna Kingfisher, MD Patient Care Team: Brianna Kingfisher, MD as PCP - General (Family Medicine)  This Provider for this visit: Treatment Team:  Attending Provider: Geronimo Amel, MD    10/07/2023 -   Chief Complaint  Patient presents with   Follow-up    F/u PFT and HRCT     HPI Brianna Santana 68 y.o. -returns for follow-up of chronic cough.  Since her last visit she says she is 80% better.  This because of promethazine  cough syrup and primary care starting her on Spiriva .  Recently somebody started on prednisone  she is taken 1 dose but this made the cough worse so she wants to stop it.  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  She also has small multiple pulmonary nodules that require follow-up.  She did have a CT sinus results are pending.  She continues to have hoarse voice.  She says she wants to taper her promethazine  to off.  In the past she is taking gabapentin  and she is very interested in this.  Otherwise feeling well.  RAST allergy  panel is normal.  Pulmonary function test also normal.  Note : even though she says she is 80% better her cough score is extremely high.  CT sinus results are pending.   IMPRESSION: 1. Mild patchy ground-glass opacity and reticulation throughout both lungs with associated mild cylindrical bronchiectasis, most prominent in the lower lobes. Findings are compatible with a mild postinflammatory fibrosis pattern due to reported history of COVID-19 infection. 2. Several solid scattered pulmonary nodules in the right greater than left lungs, largest 0.7 cm in the medial right upper lobe. Non-contrast chest CT at 3-6 months is recommended. If the nodules are stable at time of repeat CT, then future CT at 18-24 months (from today's scan) is considered  optional for low-risk patients, but is recommended for high-risk patients. This recommendation follows the consensus statement: Guidelines for Management of Incidental Pulmonary Nodules Detected on CT Images: From the Fleischner Society 2017; Radiology 2017; 284:228-243. 3. Right thyroid  4.2 cm heterogeneous hypodense nodule with associated left tracheal deviation and mild tracheal narrowing. Recommend thyroid  US . 4. Cholelithiasis. 5.  Aortic Atherosclerosis (ICD10-I70.0).     Electronically Signed   By: Brianna Santana M.D.   On: 09/02/2023 16:49     OV 03/20/2024  Subjective:  Patient ID: Brianna Santana, female , DOB: 11/19/1955 , age 68 y.o. , MRN: [id number] , ADDRESS: [address] [place] [address] PCP Brianna Kingfisher, MD Patient Care Team: Brianna Kingfisher, MD as PCP - General (Family Medicine)  This Provider for this visit: Treatment Team:  Attending Provider: Geronimo Amel, MD    03/20/2024 -   Chief Complaint  Patient presents with   Cough    Pt states breathing has Santana good Prod cough ( phlegm clear) cough isn't as bad as it use to be       HPI Brianna Santana 68 y.o. 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 Cheryle  Santana to voice rehabilitation.  She is also doing gabapentin .  But she is tolerating the gabapentin  only at 100 mg once a day at night.  Because it makes her too sleepy.  With all this the cough is significantly improved.  RSI cough score is down to 20.  Her allergy  test is turned out negative  #ILD changes and post-COVID lung nodules: This all improved  #Vaccine counseling she is asking for flu shot and pneumonia vaccine.  At the same time she is also think she has had a pneumonia vaccine a year ago.  This is not documented in our system.  Medical assistant called her pharmacy and confirmed that she did not get Prevnar.     Brianna Santana  Reflux Symptom Index (> 13-15 suggestive of LPR cough) 0 -> 5  =  none ->severe problem 08/02/2023 Fino 9 ppb. 10/07/2023 Reports she is 80% better after starting Spiriva  and also promethazine  cough syrup. 03/20/2024 Post voice rehab On gabapentin  nightly Post ENT visit  Hoarseness of problem with voice 4 5 3   Clearing  Of Throat 4 5 2   Excess throat mucus or feeling of post nasal drip 3 5 3   Difficulty swallowing food, liquid or tablets 3 4 3   Cough after eating or lying down 3 5 3   Breathing difficulties or choking episodes 3 4 0  Troublesome or annoying cough 4 5 2   Sensation of something sticking in throat or lump in throat 3 5 2   Heartburn, chest pain, indigestion, or stomach acid coming up 3 5 2   TOTAL  30 43 20       Latest Reference Range & Units 08/02/23 15:15  Class Description Allergens  Comment  D Pteronyssinus IgE Class 0 kU/L <0.10  D Farinae IgE Class 0 kU/L <0.10  Cat Dander IgE Class 0 kU/L <0.10  Dog Dander IgE Class 0 kU/L <0.10  Penicillium Chrysogen IgE Class 0 kU/L <0.10  Cladosporium Herbarum IgE Class 0 kU/L <0.10  Aspergillus Fumigatus IgE Class 0 kU/L <0.10  Mucor Racemosus IgE Class 0 kU/L <0.10  Alternaria Alternata IgE Class 0 kU/L <0.10   CT Chest data from date: 02/06/24  - personally visualized and independently interpreted : no - my findings are: as below  IMPRESSION: 1. Interval decrease in size of the more dominant right-sided pulmonary nodules seen previously. No new suspicious pulmonary nodule or mass. 2. Interval decrease in fine linear opacity seen previously in the subpleural/dependent lower lungs although some persistent chronic atelectasis or linear scarring is evident in both lung bases. 3. Stable lower lung predominant mild cylindrical bronchiectasis. 4. Cholelithiasis. 5.  Aortic Atherosclerosis (ICD10-I70.0).     Electronically Signed   By: Camellia Candle M.D.   On: 02/14/2024 05:29   CT sinuses March  2025   IMPRESSION: 1. Hyperplastic paranasal sinuses with no significant paranasal sinus mucosal thickening or opacification. Patent sinus drainage pathways. 2. Symmetric nasal cavity mucosal thickening raising the possibility of rhinitis. 3. 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     Electronically Signed   By: VEAR Hurst M.D.   On: 10/08/2023 08:57    PFT     Latest Ref Rng & Units 10/07/2023    7:59 AM  PFT Results  FVC-Pre L 2.75   FVC-Predicted Pre % 89   FVC-Post L 2.71   FVC-Predicted Post % 88   Pre FEV1/FVC % % 77   Post FEV1/FCV % % 81   FEV1-Pre L 2.10   FEV1-Predicted Pre % 90   FEV1-Post L 2.21   DLCO uncorrected ml/min/mmHg 17.39   DLCO UNC% % 88   DLVA Predicted % 100   TLC L 5.11   TLC % Predicted % 101   RV % Predicted % 106        LAB RESULTS last 96 hours No results found.       has a past medical history of Arthritis, Asthma, C. difficile colitis (2011 and 07/2010), Cervical dysplasia, Crohn disease (HCC), Crohn's disease (HCC), HSV infection, Hyperplastic polyp of intestine (09/2007), Osteopenia (11/2018), Thyroid  disease, and Urinary incontinence.   reports that she quit smoking about 35 years ago. Her smoking use included cigarettes. She started smoking about 45 years ago. She has never used smokeless tobacco.  Past Surgical History:  Procedure Laterality Date   BREAST CYST ASPIRATION Right    2000   BREAST EXCISIONAL BIOPSY Right    1980's   COLONOSCOPY  09/18/2013   COLPOSCOPY     FLEXIBLE SIGMOIDOSCOPY  03/29/2012   Procedure: FLEXIBLE SIGMOIDOSCOPY;  Surgeon: Gwendlyn ONEIDA Buddy, MD,FACG;  Location: [facility];  Service: Endoscopy;  Laterality: N/A;   GANGLION CYST EXCISION Left    POLYPECTOMY     TONSILLECTOMY     TOTAL ABDOMINAL HYSTERECTOMY  2000   TRIGGER FINGER RELEASE     WRIST SURGERY Left 2021    Allergies  Allergen Reactions   Amitriptyline Itching     hydrochloride   Amoxicillin  Itching and Rash   Levofloxacin Rash   Seasonal Ic [  Cholestatin] Other (See Comments)    Pt does not remember reaction to medicaine   Tramadol  Other (See Comments)    Major Headaches   Tramadol  Hcl Rash    Immunization History  Administered Date(s) Administered   Hepatitis B, PED/ADOLESCENT 04/11/2013, 05/10/2013, 11/12/2013   Influenza Split 04/12/2012   Influenza,inj,Quad PF,6+ Mos 05/02/2015, 04/05/2016, 02/18/2017, 02/26/2019   Influenza,inj,Quad PF,6-35 Mos 03/05/2020   PFIZER(Purple Top)SARS-COV-2 Vaccination 07/24/2019, 08/14/2019, 02/19/2020   PPD Test 03/30/2012, 11/16/2013, 12/03/2014   Pneumococcal Polysaccharide-23 03/05/2019   Pneumococcal-Unspecified 02/29/2012   Tdap 09/23/2020   Zoster, Live 09/25/2019, 03/05/2020    Family History  Problem Relation Age of Onset   Diabetes Father    Diabetes Sister        x 2   Hypertension Sister        x 2   Diabetes Brother    Hypertension Brother    Colon cancer Neg Hx    Esophageal cancer Neg Hx    Pancreatic cancer Neg Hx    Rectal cancer Neg Hx    Stomach cancer Neg Hx    Colon polyps Neg Hx    Breast cancer Neg Hx      Current Outpatient Medications:    adalimumab  (HUMIRA , 2 PEN,) 40 MG/0.4ML pen, Inject 0.4 mLs (40 mg total) into the skin every 14 (fourteen) days., Disp: 6 each, Rfl: 3   albuterol  (VENTOLIN  HFA) 108 (90 Base) MCG/ACT inhaler, INHALE 2 PUFFS INTO THE LUNGS EVERY 4 HOURS AS NEEDED FOR SHORTNESS OF BREATH OR WHEEZING (MAXIMUM 12 PUFFS TOTAL PER 24 HOURS), Disp: 54 g, Rfl: 1   azelastine  (ASTELIN ) 0.1 % nasal spray, SPRAY TWO SPRAYS INTO EACH NOSTRIL TWICE A DAY AS NEEDED FOR RHINITIS, Disp: 90 mL, Rfl: 1   CALCIUM-MAGNESIUM -VITAMIN D  PO, Take 2 tablets by mouth daily. With Zinc, Disp: , Rfl:    Carbinoxamine  Maleate 4 MG TABS, Take 1 tablet (4 mg total) by mouth in the morning and at bedtime., Disp: 180 tablet, Rfl: 1   cetirizine  (ZYRTEC ) 10 MG tablet, Take 1  tablet (10 mg total) by mouth 2 (two) times daily as needed for allergies (Can take an extra dose if needed during flare ups.)., Disp: 180 tablet, Rfl: 1   dexlansoprazole  (DEXILANT ) 60 MG capsule, Take 1 capsule (60 mg total) by mouth daily., Disp: 90 capsule, Rfl: 3   diphenhydrAMINE  (BENADRYL ) 25 MG tablet, Take 1 tablet (25 mg total) by mouth every 6 (six) hours as needed., Disp: 20 tablet, Rfl: 0   EPINEPHrine  (EPIPEN  2-PAK) 0.3 mg/0.3 mL IJ SOAJ injection, Inject 0.3 mg into the muscle as needed for anaphylaxis., Disp: 2 each, Rfl: 1   famotidine  (PEPCID ) 40 MG tablet, Take 1 tablet (40 mg total) by mouth 2 (two) times daily., Disp: 180 tablet, Rfl: 3   fluticasone  (FLONASE) 50 MCG/ACT nasal spray, Place 2 sprays into both nostrils daily., Disp: , Rfl:    fluticasone -salmeterol (WIXELA INHUB) 250-50 MCG/ACT AEPB, Inhale 1 puff into the lungs in the morning and at bedtime., Disp: 180 each, Rfl: 1   gabapentin  (NEURONTIN ) 300 MG capsule, Take 1 capsule (300 mg total) by mouth at bedtime., Disp: 30 capsule, Rfl: 3   hydrocortisone  (ANUSOL -HC) 2.5 % rectal cream, Place 1 Application rectally 2 (two) times daily., Disp: 30 g, Rfl: 1   hydrocortisone  (ANUSOL -HC) 25 MG suppository, Place 1 suppository (25 mg total) rectally at bedtime., Disp: 10 suppository, Rfl: 0   meloxicam (MOBIC) 7.5 MG tablet, Take 7.5 mg by  mouth daily., Disp: , Rfl:    montelukast  (SINGULAIR ) 10 MG tablet, Take 1 tablet (10 mg total) by mouth at bedtime., Disp: 90 tablet, Rfl: 1   Tiotropium Bromide Monohydrate  (SPIRIVA  RESPIMAT) 2.5 MCG/ACT AERS, Inhale 2 puffs into the lungs 1 day or 1 dose., Disp: 3 each, Rfl: 1   amitriptyline (ELAVIL) 10 MG tablet, Take 10 mg by mouth at bedtime. (Patient not taking: Reported on 03/20/2024), Disp: , Rfl:    UNABLE TO FIND, Med Name: reflux gourmet liquid pack, Disp: , Rfl:       Objective:   Vitals:   03/20/24 1407  BP: 118/72  Pulse: 94  Temp: 98.5 F (36.9 C)  TempSrc: Oral   SpO2: 97%  Weight: 174 lb 6.4 oz (79.1 kg)  Height: 5' 4 (1.626 m)    Estimated body mass index is 29.94 kg/m as calculated from the following:   Height as of this encounter: 5' 4 (1.626 m).   Weight as of this encounter: 174 lb 6.4 oz (79.1 kg).  @WEIGHTCHANGE @  [employer]   03/20/24 1407  Weight: 174 lb 6.4 oz (79.1 kg)     Physical Exam   General: No distress. Looks well a bit hoarse O2 at rest: no Cane present: no Sitting in wheel chair: no Frail: no Obese: no Neuro: Alert and Oriented x 3. GCS 15. Speech normal Psych: Pleasant Resp:  Barrel Chest - no.  Wheeze - no, Crackles - no, No overt respiratory distress CVS: Normal heart sounds. Murmurs - no Ext: Stigmata of Connective Tissue Disease - no HEENT: Normal upper airway. PEERL +. 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  Plan - Reduce gabapentin  to 100 mg nightly on Monday Wednesday and Friday for 1 week -> then take it only on Monday and Friday for 1 week -> then take it on Monday alone for 1 week and stop - If cough gets worse then please let us  know and we have to restart the gabapentin  - Follow all the advised by ENT and voice rehabilitation.  - continue other mediicines  #Post COVID lung nodules and interstitial changes  -These are stable/improved on CT scan in the fall 2025  #vaccine counseling  Plan  -high dose flu shot 03/20/2024 - *You are eligible for Prevnar 20 and you can either have it today or at another time on your own  Follow-up - 6 months with nurse practitioner; RSI cough score at follow-up    FOLLOWUP    Return in about 6 months (around 09/18/2024)  for with any of the APPS, Face to Face Visit.    SIGNATURE    Brianna. Dorethia Cave, M.D., F.C.C.P,  Pulmonary and Critical Care Medicine Staff Physician, [facility] Director - Interstitial Lung Disease  Program  Pulmonary Fibrosis [facility] Network at [facility] [place], [place], [address]  Pager: [phone], If no answer or between  15:00h - 7:00h: call [phone] Telephone: [phone]  2:35 PM 03/20/2024

## 2024-04-02 ENCOUNTER — Ambulatory Visit (INDEPENDENT_AMBULATORY_CARE_PROVIDER_SITE_OTHER)

## 2024-04-02 DIAGNOSIS — J302 Other seasonal allergic rhinitis: Secondary | ICD-10-CM

## 2024-04-02 DIAGNOSIS — J309 Allergic rhinitis, unspecified: Secondary | ICD-10-CM | POA: Diagnosis not present

## 2024-04-04 ENCOUNTER — Ambulatory Visit

## 2024-04-04 DIAGNOSIS — J309 Allergic rhinitis, unspecified: Secondary | ICD-10-CM | POA: Diagnosis not present

## 2024-04-04 DIAGNOSIS — J302 Other seasonal allergic rhinitis: Secondary | ICD-10-CM

## 2024-04-09 ENCOUNTER — Ambulatory Visit (INDEPENDENT_AMBULATORY_CARE_PROVIDER_SITE_OTHER)

## 2024-04-09 DIAGNOSIS — J309 Allergic rhinitis, unspecified: Secondary | ICD-10-CM

## 2024-04-09 DIAGNOSIS — J302 Other seasonal allergic rhinitis: Secondary | ICD-10-CM

## 2024-04-11 ENCOUNTER — Ambulatory Visit

## 2024-04-11 DIAGNOSIS — J309 Allergic rhinitis, unspecified: Secondary | ICD-10-CM

## 2024-04-11 DIAGNOSIS — J302 Other seasonal allergic rhinitis: Secondary | ICD-10-CM | POA: Diagnosis not present

## 2024-04-20 ENCOUNTER — Ambulatory Visit (INDEPENDENT_AMBULATORY_CARE_PROVIDER_SITE_OTHER)

## 2024-04-20 DIAGNOSIS — J302 Other seasonal allergic rhinitis: Secondary | ICD-10-CM

## 2024-04-20 DIAGNOSIS — J309 Allergic rhinitis, unspecified: Secondary | ICD-10-CM

## 2024-04-24 ENCOUNTER — Ambulatory Visit (INDEPENDENT_AMBULATORY_CARE_PROVIDER_SITE_OTHER)

## 2024-04-24 DIAGNOSIS — J302 Other seasonal allergic rhinitis: Secondary | ICD-10-CM

## 2024-04-24 DIAGNOSIS — J309 Allergic rhinitis, unspecified: Secondary | ICD-10-CM

## 2024-05-02 ENCOUNTER — Ambulatory Visit

## 2024-05-02 DIAGNOSIS — J302 Other seasonal allergic rhinitis: Secondary | ICD-10-CM

## 2024-05-02 DIAGNOSIS — J309 Allergic rhinitis, unspecified: Secondary | ICD-10-CM

## 2024-05-07 ENCOUNTER — Ambulatory Visit (INDEPENDENT_AMBULATORY_CARE_PROVIDER_SITE_OTHER)

## 2024-05-07 DIAGNOSIS — J309 Allergic rhinitis, unspecified: Secondary | ICD-10-CM

## 2024-05-07 DIAGNOSIS — J302 Other seasonal allergic rhinitis: Secondary | ICD-10-CM

## 2024-05-16 ENCOUNTER — Ambulatory Visit (INDEPENDENT_AMBULATORY_CARE_PROVIDER_SITE_OTHER)

## 2024-05-16 DIAGNOSIS — J302 Other seasonal allergic rhinitis: Secondary | ICD-10-CM

## 2024-05-16 DIAGNOSIS — J309 Allergic rhinitis, unspecified: Secondary | ICD-10-CM

## 2024-05-21 ENCOUNTER — Ambulatory Visit

## 2024-05-21 DIAGNOSIS — J309 Allergic rhinitis, unspecified: Secondary | ICD-10-CM

## 2024-05-21 DIAGNOSIS — J302 Other seasonal allergic rhinitis: Secondary | ICD-10-CM

## 2024-06-01 ENCOUNTER — Ambulatory Visit (INDEPENDENT_AMBULATORY_CARE_PROVIDER_SITE_OTHER)

## 2024-06-01 DIAGNOSIS — J302 Other seasonal allergic rhinitis: Secondary | ICD-10-CM | POA: Diagnosis not present

## 2024-06-01 DIAGNOSIS — J309 Allergic rhinitis, unspecified: Secondary | ICD-10-CM

## 2024-06-04 DIAGNOSIS — J302 Other seasonal allergic rhinitis: Secondary | ICD-10-CM

## 2024-06-04 DIAGNOSIS — J309 Allergic rhinitis, unspecified: Secondary | ICD-10-CM

## 2024-06-11 ENCOUNTER — Ambulatory Visit

## 2024-06-11 DIAGNOSIS — J302 Other seasonal allergic rhinitis: Secondary | ICD-10-CM

## 2024-06-23 ENCOUNTER — Ambulatory Visit
Admission: EM | Admit: 2024-06-23 | Discharge: 2024-06-23 | Disposition: A | Discharge status: Home / Self Care | Attending: Nurse Practitioner | Admitting: Nurse Practitioner

## 2024-06-23 DIAGNOSIS — M25512 Pain in left shoulder: Secondary | ICD-10-CM

## 2024-06-23 DIAGNOSIS — M79604 Pain in right leg: Secondary | ICD-10-CM

## 2024-06-23 MED ORDER — DEXAMETHASONE SOD PHOSPHATE PF 10 MG/ML IJ SOLN
10.0000 mg | Freq: Once | INTRAMUSCULAR | Status: AC
  Administered 2024-06-23: 10 mg via INTRAMUSCULAR

## 2024-06-23 MED ORDER — KETOROLAC TROMETHAMINE 60 MG/2ML IM SOLN
60.0000 mg | Freq: Once | INTRAMUSCULAR | Status: AC
  Administered 2024-06-23: 60 mg via INTRAMUSCULAR

## 2024-06-23 MED ORDER — ACETAMINOPHEN-CODEINE 300-30 MG PO TABS: ORAL_TABLET | ORAL | 0 refills | Status: AC

## 2024-06-23 MED ORDER — NAPROXEN 500 MG PO TABS: 500.0000 mg | ORAL_TABLET | Freq: Two times a day (BID) | ORAL | 0 refills | Status: AC

## 2024-06-23 NOTE — ED Provider Notes (Signed)
 " [facility]    CSN: [id number] Arrival date & time: 06/23/24  1230      History   Chief Complaint Chief Complaint  Patient presents with   Leg Pain    HPI Tracina Beaumont is a 69 y.o. female.   Discussed the use of AI scribe software for clinical note transcription with the patient, who gave verbal consent to proceed.   Laiyla presents with right leg pain that started 2 days ago on Thursday. The pain is located in the lower leg extending up to the thigh, affecting both the front and back portions of the leg. The pain began suddenly while walking to the refrigerator without any precipitating injury or fall. She describes the pain as coming in episodes that occur unpredictably - it can happen while sitting, standing, or walking. The pain does not worsen with weight-bearing or at night specifically, though it interferes with her sleep due to the need to move her leg. She has tried ice packs and heat without significant relief. She denies any swelling, redness, numbness, tingling, or foot pain. She has no history of recent travel and is not on blood thinners or aspirin.  The following sections of the patient's history were reviewed and updated as appropriate: allergies, current medications, past family history, past medical history, past social history, past surgical history, and problem list.     Past Medical History:  Diagnosis Date   Arthritis    Asthma    C. difficile colitis 2011 and 07/2010   Cervical dysplasia    Crohn disease (HCC)    Crohn's disease (HCC)    HSV infection    Hyperplastic polyp of intestine 09/2007   Osteopenia 11/2018   T score -2.0 FRAX 3.7% / 0.3%   Thyroid  disease    Urinary incontinence     Patient Active Problem List   Diagnosis Date Noted   COVID-19 virus infection 02/24/2022   Moderate persistent asthma with (acute) exacerbation 02/24/2022   Trigger thumb, left thumb 03/26/2020   Stenosing tenosynovitis of wrist 03/26/2020    Perennial allergic rhinitis 12/20/2016   Cough 05/10/2016   Acute left-sided thoracic back pain 04/05/2016   Routine general medical examination at a health care facility 05/02/2015   Thyroid  mass 01/16/2015   Drug allergy  07/30/2014   Crohn's disease (HCC) 03/18/2014   Osteopenia 09/11/2013   Hyperthyroidism    GERD 11/02/2007   Moderate persistent asthma without complication 10/31/2007    Past Surgical History:  Procedure Laterality Date   BREAST CYST ASPIRATION Right    2000   BREAST EXCISIONAL BIOPSY Right    1980's   COLONOSCOPY  09/18/2013   COLPOSCOPY     FLEXIBLE SIGMOIDOSCOPY  03/29/2012   Procedure: FLEXIBLE SIGMOIDOSCOPY;  Surgeon: Gwendlyn ONEIDA Buddy, MD,FACG;  Location: [facility];  Service: Endoscopy;  Laterality: N/A;   GANGLION CYST EXCISION Left    POLYPECTOMY     TONSILLECTOMY     TOTAL ABDOMINAL HYSTERECTOMY  2000   TRIGGER FINGER RELEASE     WRIST SURGERY Left 2021    OB History     Gravida  3   Para  3   Term  3   Preterm      AB      Living  3      SAB      IAB      Ectopic      Multiple      Live Births  Home Medications    Prior to Admission medications  Medication Sig Start Date End Date Taking? Authorizing Provider  acetaminophen -codeine  (TYLENOL  #3) 300-30 MG tablet Take 1 tablet by mouth every 6 hours as needed for moderate pain (pain less than 6) or take 2 tablets every 6 hours as needed for severe pain (pain greater than 7) 06/23/24  Yes Evone Arseneau, FNP  naproxen  (NAPROSYN ) 500 MG tablet Take 1 tablet (500 mg total) by mouth 2 (two) times daily with a meal. Take with food to avoid stomach upset. Do not take any additional NSAIDs while on this. You may take tylenol  in addition to this if needed for extra pain relief. 06/23/24  Yes Aeryn Medici, Lucie, FNP  adalimumab  (HUMIRA , 2 PEN,) 40 MG/0.4ML pen Inject 0.4 mLs (40 mg total) into the skin every 14 (fourteen) days. 08/12/23   Beather Delon Gibson, PA   albuterol  (VENTOLIN  HFA) 108 (90 Base) MCG/ACT inhaler INHALE 2 PUFFS INTO THE LUNGS EVERY 4 HOURS AS NEEDED FOR SHORTNESS OF BREATH OR WHEEZING (MAXIMUM 12 PUFFS TOTAL PER 24 HOURS) 07/06/22   Iva Marty Saltness, MD  azelastine  (ASTELIN ) 0.1 % nasal spray SPRAY TWO SPRAYS INTO EACH NOSTRIL TWICE A DAY AS NEEDED FOR RHINITIS 09/20/23   Iva Marty Saltness, MD  CALCIUM-MAGNESIUM -VITAMIN D  PO Take 2 tablets by mouth daily. With Zinc    [provider]  Carbinoxamine  Maleate 4 MG TABS Take 1 tablet (4 mg total) by mouth in the morning and at bedtime. 02/23/24   Iva Marty Saltness, MD  cetirizine  (ZYRTEC ) 10 MG tablet Take 1 tablet (10 mg total) by mouth 2 (two) times daily as needed for allergies (Can take an extra dose if needed during flare ups.). 09/20/23   Iva Marty Saltness, MD  dexlansoprazole  (DEXILANT ) 60 MG capsule Take 1 capsule (60 mg total) by mouth daily. 02/22/24   May, Deanna J, NP  diphenhydrAMINE  (BENADRYL ) 25 MG tablet Take 1 tablet (25 mg total) by mouth every 6 (six) hours as needed. 10/16/19   Donah Riis A, PA-C  EPINEPHrine  (EPIPEN  2-PAK) 0.3 mg/0.3 mL IJ SOAJ injection Inject 0.3 mg into the muscle as needed for anaphylaxis. 12/22/23   Jeneal Danita Macintosh, MD  famotidine  (PEPCID ) 40 MG tablet Take 1 tablet (40 mg total) by mouth 2 (two) times daily. 02/22/24   May, Deanna J, NP  fluticasone  (FLONASE) 50 MCG/ACT nasal spray Place 2 sprays into both nostrils daily.    [provider]  fluticasone -salmeterol (WIXELA INHUB) 250-50 MCG/ACT AEPB Inhale 1 puff into the lungs in the morning and at bedtime. 09/20/23   Iva Marty Saltness, MD  hydrocortisone  (ANUSOL -HC) 2.5 % rectal cream Place 1 Application rectally 2 (two) times daily. 03/06/24   Nandigam, Kavitha V, MD  hydrocortisone  (ANUSOL -HC) 25 MG suppository Place 1 suppository (25 mg total) rectally at bedtime. 02/22/24   May, Deanna J, NP  montelukast  (SINGULAIR ) 10 MG tablet Take 1 tablet (10 mg  total) by mouth at bedtime. 03/19/24   Iva Marty Saltness, MD  Tiotropium Bromide Monohydrate  (SPIRIVA  RESPIMAT) 2.5 MCG/ACT AERS Inhale 2 puffs into the lungs 1 day or 1 dose. 02/21/24   Iva Marty Saltness, MD  UNABLE TO FIND Med Name: reflux gourmet liquid pack    [provider]    Family History Family History  Problem Relation Age of Onset   Diabetes Father    Diabetes Sister        x 2   Hypertension Sister  x 2   Diabetes Brother    Hypertension Brother    Colon cancer Neg Hx    Esophageal cancer Neg Hx    Pancreatic cancer Neg Hx    Rectal cancer Neg Hx    Stomach cancer Neg Hx    Colon polyps Neg Hx    Breast cancer Neg Hx     Social History Social History[1]   Allergies   Amitriptyline, Amoxicillin , Levofloxacin, Seasonal ic [cholestatin], Tramadol , and Tramadol  hcl   Review of Systems Review of Systems  Constitutional:  Negative for fever.  Cardiovascular:  Negative for chest pain, palpitations and leg swelling.  Musculoskeletal:  Positive for arthralgias. Negative for gait problem and joint swelling.  Skin:  Negative for rash.  Neurological:  Negative for weakness and numbness.  All other systems reviewed and are negative.    Physical Exam Triage Vital Signs ED Triage Vitals  Encounter Vitals Group     BP 06/23/24 1320 (!) 150/73     Girls Systolic BP Percentile --      Girls Diastolic BP Percentile --      Boys Systolic BP Percentile --      Boys Diastolic BP Percentile --      Pulse Rate 06/23/24 1320 79     Resp 06/23/24 1320 16     Temp 06/23/24 1320 98.2 F (36.8 C)     Temp Source 06/23/24 1320 Oral     SpO2 06/23/24 1320 96 %     Weight --      Height --      Head Circumference --      Peak Flow --      Pain Score 06/23/24 1319 8     Pain Loc --      Pain Education --      Exclude from Growth Chart --    No data found.  Updated Vital Signs BP (!) 150/73 (BP Location: Left Arm)   Pulse 79   Temp 98.2 F  (36.8 C) (Oral)   Resp 16   SpO2 96%   Visual Acuity Right Eye Distance:   Left Eye Distance:   Bilateral Distance:    Right Eye Near:   Left Eye Near:    Bilateral Near:     Physical Exam Vitals reviewed.  Constitutional:      General: She is awake. She is not in acute distress.    Appearance: Normal appearance. She is well-developed. She is not ill-appearing, toxic-appearing or diaphoretic.  HENT:     Head: Normocephalic.     Right Ear: Hearing normal.     Left Ear: Hearing normal.     Nose: Nose normal.     Mouth/Throat:     Mouth: Mucous membranes are moist.  Eyes:     General: Vision grossly intact.     Conjunctiva/sclera: Conjunctivae normal.  Cardiovascular:     Rate and Rhythm: Normal rate and regular rhythm.     Heart sounds: Normal heart sounds.  Pulmonary:     Effort: Pulmonary effort is normal.     Breath sounds: Normal breath sounds and air entry.  Musculoskeletal:        General: Normal range of motion.     Left shoulder: No swelling, deformity, effusion or crepitus. Normal range of motion.       Arms:     Cervical back: Normal, full passive range of motion without pain, normal range of motion and neck supple.     Thoracic back:  Normal.     Lumbar back: Normal.     Right lower leg: Tenderness present. No swelling or deformity. No edema.       Legs:     Comments: Tenderness is noted over the posterior aspect of the left shoulder. The left upper extremity demonstrates full range of motion without deformity, restriction, or crepitus. Strength and sensation are intact throughout the shoulder and entire extremity.  Pain to the right lower leg without associated swelling, deformity, increased warmth, or limitation in range of motion. No focal tenderness appreciated on examination. Strength and sensation are intact throughout the extremity, with no neurologic deficits noted.   Skin:    General: Skin is warm and dry.  Neurological:     General: No focal  deficit present.     Mental Status: She is alert and oriented to person, place, and time.     Cranial Nerves: No cranial nerve deficit.     Sensory: Sensation is intact. No sensory deficit.     Motor: Motor function is intact. No weakness.     Gait: Gait is intact.  Psychiatric:        Speech: Speech normal.        Behavior: Behavior is cooperative.      UC Treatments / Results  Labs (all labs ordered are listed, but only abnormal results are displayed) Labs Reviewed - No data to display  EKG   Radiology No results found.  Procedures Procedures (including critical care time)  Medications Ordered in UC Medications  dexamethasone  (DECADRON ) injection 10 mg (10 mg Intramuscular Given 06/23/24 1444)  ketorolac  (TORADOL ) injection 60 mg (60 mg Intramuscular Given 06/23/24 1444)    Initial Impression / Assessment and Plan / UC Course  I have reviewed the triage vital signs and the nursing notes.  Pertinent labs & imaging results that were available during my care of the patient were reviewed by me and considered in my medical decision making (see chart for details).     Jazlin presents with acute right leg pain for two days and left shoulder pain for five days. The right leg pain is sharp and intermittent, radiating from the lower leg to the thigh, without associated swelling, redness, warmth, numbness, or foot involvement. There is no history of recent travel, immobilization, injury, or anticoagulant use. Given the lack of swelling or inflammatory skin changes, deep vein thrombosis is less likely. The pain pattern is not consistent with peripheral artery disease, as it is not reliably exertional or relieved by rest. Peripheral neuropathy is also unlikely due to absence of distal symptoms, typical nerve distribution, or diabetic history. Bone pathology, tendon injury, and stress fracture are less probable given no trauma history and the intermittent, non-activity-related nature of  the pain. Prior laboratory results from September demonstrated normal renal function and electrolytes, making metabolic causes unlikely. Overall presentation suggests an inflammatory or musculoskeletal etiology, with serious vascular, neurologic, and structural causes considered and felt to be unlikely at this time.  Patient was treated in clinic with dexamethasone  and ketorolac  injections for inflammation and pain control. She was prescribed naproxen  to take twice daily with food and acetaminophen  with codeine  for breakthrough moderate to severe pain, with strict dosing instructions reviewed. She was advised to avoid all other NSAIDs while taking naproxen . Patient was instructed to seek immediate emergency care for development of leg swelling, redness, warmth, fever, worsening or persistent night pain, new numbness or weakness, or any clear progression of symptoms. Follow-up with primary care was  advised if pain persists, with consideration for referral to orthopedics or neurology for further evaluation and imaging as indicated.  Today's evaluation has revealed no signs of a dangerous process. Discussed diagnosis with patient and/or guardian. Patient and/or guardian aware of their diagnosis, possible red flag symptoms to watch out for and need for close follow up. Patient and/or guardian understands verbal and written discharge instructions. Patient and/or guardian comfortable with plan and disposition.  Patient and/or guardian has a clear mental status at this time, good insight into illness (after discussion and teaching) and has clear judgment to make decisions regarding their care  Documentation was completed with the aid of voice recognition software. Transcription may contain typographical errors.  Final Clinical Impressions(s) / UC Diagnoses   Final diagnoses:  Acute pain of right lower extremity  Pain in shoulder region, left     Discharge Instructions      You were seen today for right  leg pain and left shoulder pain. Based on your symptoms, exam, and review of prior labs, there are no signs at this time of a blood clot, circulation problem, nerve damage, or broken bone. Your pain is most consistent with inflammation or a musculoskeletal cause, though the exact source is not clear right now.  You were given injections of medications in the clinic to help reduce inflammation and pain. At home, take naproxen  twice daily with food as prescribed and do not take other anti-inflammatory medications such as ibuprofen or Aleve  while using it. You may use the prescribed acetaminophen  with codeine  for moderate to severe pain, making sure to wait a full six hours between doses. Gentle activity as tolerated, light stretching, rest, and applying heat or ice to the painful areas may also help with comfort.  Please follow up with your primary care provider if the pain does not improve or continues over the next several days, as you may need referral to a specialist such as orthopedics or neurology for further testing or imaging. Go to the emergency department right away if you develop new or worsening leg swelling, redness, warmth, fever, severe or worsening night pain, numbness or weakness in your arms or legs, trouble walking, or if your symptoms suddenly worsen.      ED Prescriptions     Medication Sig Dispense Auth. Provider   naproxen  (NAPROSYN ) 500 MG tablet Take 1 tablet (500 mg total) by mouth 2 (two) times daily with a meal. Take with food to avoid stomach upset. Do not take any additional NSAIDs while on this. You may take tylenol  in addition to this if needed for extra pain relief. 20 tablet Iola Lukes, FNP   acetaminophen -codeine  (TYLENOL  #3) 300-30 MG tablet Take 1 tablet by mouth every 6 hours as needed for moderate pain (pain less than 6) or take 2 tablets every 6 hours as needed for severe pain (pain greater than 7) 15 tablet Iola Lukes, FNP      I have reviewed  the PDMP during this encounter.      [1]  Social History Tobacco Use   Smoking status: Former    Current packs/day: 0.00    Types: Cigarettes    Start date: 09/08/1978    Quit date: 09/07/1988    Years since quitting: 35.8   Smokeless tobacco: Never  Vaping Use   Vaping status: Never Used  Substance Use Topics   Alcohol use: No    Alcohol/week: 0.0 standard drinks of alcohol   Drug use: No  Iola Lukes, [place] 06/23/24 1606  "

## 2024-06-23 NOTE — Discharge Instructions (Addendum)
 You were seen today for right leg pain and left shoulder pain. Based on your symptoms, exam, and review of prior labs, there are no signs at this time of a blood clot, circulation problem, nerve damage, or broken bone. Your pain is most consistent with inflammation or a musculoskeletal cause, though the exact source is not clear right now.  You were given injections of medications in the clinic to help reduce inflammation and pain. At home, take naproxen  twice daily with food as prescribed and do not take other anti-inflammatory medications such as ibuprofen or Aleve  while using it. You may use the prescribed acetaminophen  with codeine  for moderate to severe pain, making sure to wait a full six hours between doses. Gentle activity as tolerated, light stretching, rest, and applying heat or ice to the painful areas may also help with comfort.  Please follow up with your primary care provider if the pain does not improve or continues over the next several days, as you may need referral to a specialist such as orthopedics or neurology for further testing or imaging. Go to the emergency department right away if you develop new or worsening leg swelling, redness, warmth, fever, severe or worsening night pain, numbness or weakness in your arms or legs, trouble walking, or if your symptoms suddenly worsen.

## 2024-06-23 NOTE — ED Triage Notes (Signed)
 Pt states right calf pain that runs up her right leg states it is sharp. And left upper back pain. Pt denies any injury.  States she has been using  ice and heat and OTC arthritis medicine.

## 2024-06-27 ENCOUNTER — Ambulatory Visit

## 2024-06-27 DIAGNOSIS — J302 Other seasonal allergic rhinitis: Secondary | ICD-10-CM

## 2024-06-27 NOTE — Progress Notes (Signed)
 VIAL MADE ON 06/27/24

## 2024-07-03 ENCOUNTER — Ambulatory Visit

## 2024-07-03 NOTE — Progress Notes (Signed)
 "  ERR  "

## 2024-12-12 ENCOUNTER — Encounter: Admitting: Nurse Practitioner

## 2024-12-18 ENCOUNTER — Encounter: Admitting: Nurse Practitioner

## 2025-02-21 ENCOUNTER — Ambulatory Visit: Admitting: Allergy & Immunology
